# Patient Record
Sex: Female | Born: 1945 | Race: White | Hispanic: No | Smoking: Never smoker
Health system: Southern US, Community
[De-identification: ages and names within clinical notes are randomized; demographics above are authoritative.]

## PROBLEM LIST (undated history)

## (undated) DIAGNOSIS — R0602 Shortness of breath: Secondary | ICD-10-CM

## (undated) DIAGNOSIS — K219 Gastro-esophageal reflux disease without esophagitis: Secondary | ICD-10-CM

## (undated) DIAGNOSIS — R32 Unspecified urinary incontinence: Secondary | ICD-10-CM

## (undated) DIAGNOSIS — K529 Noninfective gastroenteritis and colitis, unspecified: Secondary | ICD-10-CM

## (undated) DIAGNOSIS — F32A Depression, unspecified: Secondary | ICD-10-CM

## (undated) DIAGNOSIS — T4145XA Adverse effect of unspecified anesthetic, initial encounter: Secondary | ICD-10-CM

## (undated) DIAGNOSIS — T8859XA Other complications of anesthesia, initial encounter: Secondary | ICD-10-CM

## (undated) DIAGNOSIS — J189 Pneumonia, unspecified organism: Secondary | ICD-10-CM

## (undated) DIAGNOSIS — R159 Full incontinence of feces: Secondary | ICD-10-CM

## (undated) DIAGNOSIS — F419 Anxiety disorder, unspecified: Secondary | ICD-10-CM

## (undated) DIAGNOSIS — R42 Dizziness and giddiness: Secondary | ICD-10-CM

## (undated) DIAGNOSIS — N3020 Other chronic cystitis without hematuria: Secondary | ICD-10-CM

## (undated) DIAGNOSIS — I10 Essential (primary) hypertension: Secondary | ICD-10-CM

## (undated) DIAGNOSIS — Z972 Presence of dental prosthetic device (complete) (partial): Secondary | ICD-10-CM

## (undated) DIAGNOSIS — R569 Unspecified convulsions: Secondary | ICD-10-CM

## (undated) DIAGNOSIS — K08109 Complete loss of teeth, unspecified cause, unspecified class: Secondary | ICD-10-CM

## (undated) DIAGNOSIS — E039 Hypothyroidism, unspecified: Secondary | ICD-10-CM

## (undated) DIAGNOSIS — R251 Tremor, unspecified: Secondary | ICD-10-CM

## (undated) DIAGNOSIS — C50919 Malignant neoplasm of unspecified site of unspecified female breast: Secondary | ICD-10-CM

## (undated) DIAGNOSIS — Z9221 Personal history of antineoplastic chemotherapy: Secondary | ICD-10-CM

## (undated) DIAGNOSIS — Z86010 Personal history of colonic polyps: Secondary | ICD-10-CM

## (undated) DIAGNOSIS — G4730 Sleep apnea, unspecified: Secondary | ICD-10-CM

## (undated) DIAGNOSIS — I639 Cerebral infarction, unspecified: Secondary | ICD-10-CM

## (undated) DIAGNOSIS — D649 Anemia, unspecified: Secondary | ICD-10-CM

## (undated) DIAGNOSIS — F329 Major depressive disorder, single episode, unspecified: Secondary | ICD-10-CM

## (undated) DIAGNOSIS — Z923 Personal history of irradiation: Secondary | ICD-10-CM

## (undated) DIAGNOSIS — H9190 Unspecified hearing loss, unspecified ear: Secondary | ICD-10-CM

## (undated) DIAGNOSIS — M1990 Unspecified osteoarthritis, unspecified site: Secondary | ICD-10-CM

## (undated) DIAGNOSIS — F445 Conversion disorder with seizures or convulsions: Secondary | ICD-10-CM

## (undated) HISTORY — DX: Full incontinence of feces: R15.9

## (undated) HISTORY — DX: Conversion disorder with seizures or convulsions: F44.5

## (undated) HISTORY — DX: Tremor, unspecified: R25.1

## (undated) HISTORY — DX: Personal history of irradiation: Z92.3

## (undated) HISTORY — PX: COLONOSCOPY: SHX174

## (undated) HISTORY — DX: Dizziness and giddiness: R42

## (undated) HISTORY — PX: OTHER SURGICAL HISTORY: SHX169

## (undated) HISTORY — DX: Unspecified urinary incontinence: R32

## (undated) HISTORY — DX: Unspecified convulsions: R56.9

## (undated) HISTORY — DX: Anemia, unspecified: D64.9

## (undated) HISTORY — DX: Personal history of colonic polyps: Z86.010

## (undated) HISTORY — PX: MULTIPLE TOOTH EXTRACTIONS: SHX2053

## (undated) HISTORY — DX: Depression, unspecified: F32.A

## (undated) HISTORY — DX: Major depressive disorder, single episode, unspecified: F32.9

## (undated) HISTORY — DX: Essential (primary) hypertension: I10

## (undated) HISTORY — DX: Other chronic cystitis without hematuria: N30.20

## (undated) HISTORY — PX: ABDOMINAL HYSTERECTOMY: SHX81

---

## 1978-08-26 DIAGNOSIS — I639 Cerebral infarction, unspecified: Secondary | ICD-10-CM

## 1978-08-26 HISTORY — DX: Cerebral infarction, unspecified: I63.9

## 1998-05-11 ENCOUNTER — Encounter: Payer: Self-pay | Admitting: Emergency Medicine

## 1998-05-11 ENCOUNTER — Inpatient Hospital Stay (HOSPITAL_COMMUNITY): Admission: EM | Admit: 1998-05-11 | Discharge: 1998-05-12 | Payer: Self-pay | Admitting: Emergency Medicine

## 1999-01-12 ENCOUNTER — Other Ambulatory Visit: Admission: RE | Admit: 1999-01-12 | Discharge: 1999-01-12 | Payer: Self-pay | Admitting: Family Medicine

## 1999-02-22 ENCOUNTER — Emergency Department (HOSPITAL_COMMUNITY): Admission: EM | Admit: 1999-02-22 | Discharge: 1999-02-22 | Payer: Self-pay | Admitting: Emergency Medicine

## 1999-02-22 ENCOUNTER — Encounter: Payer: Self-pay | Admitting: Emergency Medicine

## 2000-02-13 ENCOUNTER — Emergency Department (HOSPITAL_COMMUNITY): Admission: EM | Admit: 2000-02-13 | Discharge: 2000-02-13 | Payer: Self-pay | Admitting: Emergency Medicine

## 2000-07-08 ENCOUNTER — Encounter: Admission: RE | Admit: 2000-07-08 | Discharge: 2000-08-13 | Payer: Self-pay | Admitting: Family Medicine

## 2000-07-23 ENCOUNTER — Encounter: Payer: Self-pay | Admitting: Family Medicine

## 2000-07-23 ENCOUNTER — Encounter: Admission: RE | Admit: 2000-07-23 | Discharge: 2000-07-23 | Payer: Self-pay | Admitting: Family Medicine

## 2000-08-01 ENCOUNTER — Ambulatory Visit (HOSPITAL_COMMUNITY): Admission: RE | Admit: 2000-08-01 | Discharge: 2000-08-01 | Payer: Self-pay | Admitting: Family Medicine

## 2000-08-01 ENCOUNTER — Encounter: Payer: Self-pay | Admitting: Family Medicine

## 2000-09-11 ENCOUNTER — Encounter: Admission: RE | Admit: 2000-09-11 | Discharge: 2000-09-11 | Payer: Self-pay | Admitting: Family Medicine

## 2000-09-11 ENCOUNTER — Encounter: Payer: Self-pay | Admitting: Family Medicine

## 2000-09-15 ENCOUNTER — Encounter: Payer: Self-pay | Admitting: Neurosurgery

## 2000-09-15 ENCOUNTER — Ambulatory Visit (HOSPITAL_COMMUNITY): Admission: RE | Admit: 2000-09-15 | Discharge: 2000-09-15 | Payer: Self-pay | Admitting: Neurosurgery

## 2001-05-07 ENCOUNTER — Inpatient Hospital Stay (HOSPITAL_COMMUNITY): Admission: EM | Admit: 2001-05-07 | Discharge: 2001-05-08 | Payer: Self-pay | Admitting: Emergency Medicine

## 2001-05-07 ENCOUNTER — Encounter: Payer: Self-pay | Admitting: Emergency Medicine

## 2001-05-08 ENCOUNTER — Encounter: Payer: Self-pay | Admitting: Internal Medicine

## 2001-07-26 ENCOUNTER — Emergency Department (HOSPITAL_COMMUNITY): Admission: EM | Admit: 2001-07-26 | Discharge: 2001-07-27 | Payer: Self-pay | Admitting: Emergency Medicine

## 2002-05-18 ENCOUNTER — Inpatient Hospital Stay (HOSPITAL_COMMUNITY): Admission: EM | Admit: 2002-05-18 | Discharge: 2002-05-22 | Payer: Self-pay | Admitting: Psychiatry

## 2002-05-31 ENCOUNTER — Encounter: Payer: Self-pay | Admitting: Emergency Medicine

## 2002-05-31 ENCOUNTER — Emergency Department (HOSPITAL_COMMUNITY): Admission: EM | Admit: 2002-05-31 | Discharge: 2002-06-01 | Payer: Self-pay | Admitting: Emergency Medicine

## 2002-06-01 ENCOUNTER — Encounter: Payer: Self-pay | Admitting: Emergency Medicine

## 2002-12-15 ENCOUNTER — Ambulatory Visit (HOSPITAL_COMMUNITY): Admission: RE | Admit: 2002-12-15 | Discharge: 2002-12-15 | Payer: Self-pay | Admitting: Neurosurgery

## 2002-12-15 ENCOUNTER — Encounter: Payer: Self-pay | Admitting: Neurosurgery

## 2003-03-08 ENCOUNTER — Encounter: Admission: RE | Admit: 2003-03-08 | Discharge: 2003-03-08 | Payer: Self-pay | Admitting: Orthopedic Surgery

## 2003-03-08 ENCOUNTER — Encounter: Payer: Self-pay | Admitting: Orthopedic Surgery

## 2003-05-17 ENCOUNTER — Encounter: Payer: Self-pay | Admitting: Radiology

## 2003-05-17 ENCOUNTER — Encounter: Admission: RE | Admit: 2003-05-17 | Discharge: 2003-05-17 | Payer: Self-pay | Admitting: Neurosurgery

## 2003-05-17 ENCOUNTER — Encounter: Payer: Self-pay | Admitting: Neurosurgery

## 2003-06-20 ENCOUNTER — Encounter: Admission: RE | Admit: 2003-06-20 | Discharge: 2003-06-20 | Payer: Self-pay | Admitting: Neurosurgery

## 2003-06-20 ENCOUNTER — Encounter: Payer: Self-pay | Admitting: Neurosurgery

## 2003-08-27 HISTORY — PX: BACK SURGERY: SHX140

## 2003-08-27 HISTORY — PX: TOTAL HIP ARTHROPLASTY: SHX124

## 2004-03-14 ENCOUNTER — Inpatient Hospital Stay (HOSPITAL_COMMUNITY): Admission: RE | Admit: 2004-03-14 | Discharge: 2004-03-21 | Payer: Self-pay | Admitting: Neurosurgery

## 2004-03-21 ENCOUNTER — Inpatient Hospital Stay
Admission: RE | Admit: 2004-03-21 | Discharge: 2004-04-04 | Payer: Self-pay | Admitting: Physical Medicine & Rehabilitation

## 2004-04-08 ENCOUNTER — Emergency Department (HOSPITAL_COMMUNITY): Admission: EM | Admit: 2004-04-08 | Discharge: 2004-04-08 | Payer: Self-pay | Admitting: Emergency Medicine

## 2004-04-19 ENCOUNTER — Encounter: Admission: RE | Admit: 2004-04-19 | Discharge: 2004-04-19 | Payer: Self-pay | Admitting: Neurosurgery

## 2004-04-24 ENCOUNTER — Encounter: Admission: RE | Admit: 2004-04-24 | Discharge: 2004-06-25 | Payer: Self-pay | Admitting: Neurosurgery

## 2004-06-26 ENCOUNTER — Ambulatory Visit: Payer: Self-pay | Admitting: Physical Medicine & Rehabilitation

## 2004-06-26 ENCOUNTER — Inpatient Hospital Stay (HOSPITAL_COMMUNITY): Admission: RE | Admit: 2004-06-26 | Discharge: 2004-06-29 | Payer: Self-pay | Admitting: Orthopedic Surgery

## 2004-06-29 ENCOUNTER — Inpatient Hospital Stay
Admission: RE | Admit: 2004-06-29 | Discharge: 2004-07-06 | Payer: Self-pay | Admitting: Physical Medicine & Rehabilitation

## 2004-06-29 ENCOUNTER — Ambulatory Visit: Payer: Self-pay | Admitting: Physical Medicine & Rehabilitation

## 2004-09-28 ENCOUNTER — Ambulatory Visit: Payer: Self-pay | Admitting: Cardiovascular Disease

## 2004-10-18 ENCOUNTER — Ambulatory Visit: Payer: Self-pay | Admitting: Cardiology

## 2004-12-05 ENCOUNTER — Ambulatory Visit: Payer: Self-pay | Admitting: Family Medicine

## 2005-03-15 ENCOUNTER — Ambulatory Visit (HOSPITAL_COMMUNITY): Admission: RE | Admit: 2005-03-15 | Discharge: 2005-03-15 | Payer: Self-pay | Admitting: Family Medicine

## 2005-03-15 ENCOUNTER — Emergency Department (HOSPITAL_COMMUNITY): Admission: EM | Admit: 2005-03-15 | Discharge: 2005-03-15 | Payer: Self-pay | Admitting: Emergency Medicine

## 2005-03-23 ENCOUNTER — Emergency Department (HOSPITAL_COMMUNITY): Admission: EM | Admit: 2005-03-23 | Discharge: 2005-03-23 | Payer: Self-pay | Admitting: Emergency Medicine

## 2005-03-26 ENCOUNTER — Ambulatory Visit: Payer: Self-pay | Admitting: Family Medicine

## 2005-04-04 ENCOUNTER — Encounter: Admission: RE | Admit: 2005-04-04 | Discharge: 2005-04-04 | Payer: Self-pay | Admitting: Internal Medicine

## 2005-06-14 ENCOUNTER — Ambulatory Visit: Payer: Self-pay | Admitting: Family Medicine

## 2005-08-30 ENCOUNTER — Ambulatory Visit: Payer: Self-pay | Admitting: Family Medicine

## 2005-09-27 ENCOUNTER — Ambulatory Visit: Payer: Self-pay | Admitting: Cardiovascular Disease

## 2005-10-03 ENCOUNTER — Encounter: Payer: Self-pay | Admitting: Cardiology

## 2005-10-03 ENCOUNTER — Ambulatory Visit: Payer: Self-pay

## 2005-10-25 ENCOUNTER — Emergency Department (HOSPITAL_COMMUNITY): Admission: EM | Admit: 2005-10-25 | Discharge: 2005-10-25 | Payer: Self-pay | Admitting: Emergency Medicine

## 2005-10-25 ENCOUNTER — Inpatient Hospital Stay (HOSPITAL_COMMUNITY): Admission: EM | Admit: 2005-10-25 | Discharge: 2005-10-27 | Payer: Self-pay | Admitting: Emergency Medicine

## 2005-10-28 ENCOUNTER — Inpatient Hospital Stay (HOSPITAL_COMMUNITY): Admission: EM | Admit: 2005-10-28 | Discharge: 2005-11-01 | Payer: Self-pay | Admitting: Emergency Medicine

## 2005-10-28 ENCOUNTER — Encounter: Payer: Self-pay | Admitting: Cardiology

## 2005-10-28 ENCOUNTER — Ambulatory Visit: Payer: Self-pay | Admitting: Cardiology

## 2005-11-12 ENCOUNTER — Encounter: Payer: Self-pay | Admitting: Emergency Medicine

## 2005-11-13 ENCOUNTER — Inpatient Hospital Stay (HOSPITAL_COMMUNITY): Admission: EM | Admit: 2005-11-13 | Discharge: 2005-11-15 | Payer: Self-pay | Admitting: Internal Medicine

## 2005-11-21 ENCOUNTER — Ambulatory Visit: Payer: Self-pay | Admitting: Family Medicine

## 2008-01-09 ENCOUNTER — Telehealth: Payer: Self-pay | Admitting: Internal Medicine

## 2009-03-31 ENCOUNTER — Emergency Department (HOSPITAL_COMMUNITY): Admission: EM | Admit: 2009-03-31 | Discharge: 2009-03-31 | Payer: Self-pay | Admitting: Emergency Medicine

## 2009-04-15 ENCOUNTER — Telehealth: Payer: Self-pay | Admitting: Family Medicine

## 2009-06-18 ENCOUNTER — Telehealth: Payer: Self-pay | Admitting: Internal Medicine

## 2009-06-23 ENCOUNTER — Encounter: Payer: Self-pay | Admitting: Family Medicine

## 2009-06-26 ENCOUNTER — Telehealth (INDEPENDENT_AMBULATORY_CARE_PROVIDER_SITE_OTHER): Payer: Self-pay | Admitting: *Deleted

## 2009-07-03 ENCOUNTER — Telehealth (INDEPENDENT_AMBULATORY_CARE_PROVIDER_SITE_OTHER): Payer: Self-pay | Admitting: *Deleted

## 2010-09-15 ENCOUNTER — Encounter: Payer: Self-pay | Admitting: Neurosurgery

## 2010-09-16 ENCOUNTER — Encounter: Payer: Self-pay | Admitting: Cardiovascular Disease

## 2011-01-11 NOTE — Discharge Summary (Signed)
[place]. [facility]  Patient:    [name], [name] Visit Number: [id number] MRN: [id number]          Service Type: MED Location: [phone] Attending Physician:  Dorena Cookey Dictated by:   Cornell Barman, P.A. Admit Date:  05/07/2001 Discharge Date: 05/08/2001   CC:         [name], M.D.   Discharge Summary  DISCHARGE DIAGNOSES: 1. Near syncope. 2. Dyspnea. 3. Hypertension. 4. Epigastric pain.  HISTORY OF PRESENT ILLNESS:  [name] is a 65 year old white female who presents with an episode of dyspnea and near syncope earlier today.  She has difficulty describing this episode.  She states it occurred around 6 a.m.  The patient awoke at 3 a.m., unable to sleep.  She cannot elicit any reason for her inability to sleep.  She denies shortness of breath or chest pain. However, at 6 a.m. while playing cards with her daughter, she had a strange sensation overcome her where she felt like she was going to pass out.  She did have a "strange sensation" in her midepigastric area.  She also had some nausea and a hot flush.  She did feel that her vision was blurred and she was near losing consciousness.  She did not lose consciousness, and the episode lasted about two minutes.  After this episode she took a Xanax and began feeling sleepy.  PAST MEDICAL HISTORY: 1. Hypertension. 2. Anxiety and depression. 3. Osteoarthritis. 4. Degenerative disk disease. 5. The patient has evidence of lumbar and sacral spinal stenosis on MRI. 6. History of total vaginal hysterectomy. 7. 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  HOSPITAL COURSE: #1 - NEAR SYNCOPE:  The etiology is not clear.  This may have been a vasovagal reaction secondary to sleep deprivation.  The patient had no further episodes.  #2 - EPIGASTRIC PAIN:  We admitted the patient to telemetry and ruled out myocardial infarction.  Cardiac enzymes were negative.  We did also empirically start the  patient on some Protonix for reflux disease.  EKG was without ischemia.  Cardiac enzymes were negative.  #3 - GASTROESOPHAGEAL REFLUX DISEASE:  Again, we empirically started the patient on some Protonix.  She has been taking ibuprofen for several weeks for some orthopedic complaints, and we were concerned that she may have some gastritis.  There is no evidence of GI bleed at this time.  The patients ibuprofen was discontinued, and we will have her use Vioxx at discharge. Because of her epigastric pain we did get a right upper quadrant ultrasound that was negative for gallstones.  #4 - CARBUNCLE:  She has a carbuncle in her right armpit.  She was started on Keflex for this, and she will complete a 7-day course.  #5 - DYSLIPIDEMIA:  We did do a fasting lipid profile on the patient while she was her, and her triglycerides were elevated at 283.  Otherwise, cholesterol was normal at 164, HDL was 37, and LDL was 78.  #6 - HYPOKALEMIA:  We did replace her potassium.  DISCHARGE LABORATORY DATA:  A 2-D echo showed no left ventricular function, no wall motion abnormalities.  Hemoglobin 12.6, hematocrit 35.2.  TSH was 3.114.  Potassium 3.2, glucose 122. LFTs were normal.  DISCHARGE MEDICATIONS: 1. Premarin 1.25 mg q.d. 2. Celexa 40 mg q.d. 3. Diovan HCT 160/12.5 mg b.i.d. 4. Toprol XL 50 mg q.d. 5. Xanax as at home. 6. Protonix 40 mg q.d. 7. Vioxx 25  mg q.d. 8. Keflex 500 mg 1 tablet b.i.d. for seven days.  FOLLOW-UP:  With Dr. Clent Hoffman in one to two weeks. Dictated by:   Cornell Barman, P.A. Attending Physician:  Dorena Cookey DD:  05/08/01 TD:  05/08/01 Job: [id number]

## 2011-01-11 NOTE — Discharge Summary (Signed)
[name], [name]                 ACCOUNT NO.:  [id number]   MEDICAL RECORD NO.:  [id number]          PATIENT TYPE:  INP   LOCATION:  1409                         FACILITY:  [facility]   PHYSICIAN:  Corinna L. Lendell Caprice, MDDATE OF BIRTH:  [birth year]   DATE OF ADMISSION:  10/28/2005  DATE OF DISCHARGE:  11/01/2005                                 DISCHARGE SUMMARY   DISCHARGE DIAGNOSES:  1.  Congestive heart failure secondary to uncontrolled hypertension.  2.  Uncontrolled hypertension.  3.  Recent pneumonia.  4.  Gastroesophageal reflux disease.  5.  Chronic cough.  6.  Chronic pain.  7.  Anxiety/depression.  8.  History of tremor.  9.  Hypothyroidism.  10. 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  11. Abnormal cardiac enzymes secondary to uncontrolled hypertension and      congestive heart failure.  12. Bladder incontinence, being worked up by Dr. Etta Grandchild.  13. Abdominal aortic aneurysm, reportedly measuring 3.5 to 3.6 cm, according      to Dr. Fabio Bering note.   DISCHARGE MEDICATIONS:  1.  Her clonidine has been increased to 0.2 mg p.o. b.i.d.  2.  She is to continue atenolol 100 mg p.o. daily.  3.  She is started on hydralazine 10 mg p.o. t.i.d.  4.  Lasix 40 mg p.o. daily.  5.  She is to stop Avelox.  6.  She may use over-the-counter antifungal cream to her groin, as she had      some intertrigo there as well, which I failed to mention.  7.  She may continue her other outpatient medications, which include      Neurontin, Darvocet, Synthroid, Premarin, Nexium, Lexapro.   CONDITION:  Stable.   FOLLOW UP:  1.  Follow up with [facility] in 2-4 weeks.  2.  Follow up with Dr. Eden Emms as previously scheduled.  3.  Follow up with Dr. Etta Grandchild as previously scheduled.   CONDITION:  Stable.   CONSULTATIONS:  None.   PROCEDURES:  None.   DIET:  Low salt.   ACTIVITY:  Ad lib.   PERTINENT  LABORATORIES:  ABG on room air revealed a pH of 7.364, pCO2 44,  pO2 65.  CBC:  Significant for a hemoglobin of 10.8, hematocrit 33.5,  otherwise unremarkable.  PTT 44.  D-dimer 0.93.  INR 1.  Complete metabolic  panel significant for an albumin of 2.8.  Peak CPK was 496.  Peak MB was 6.  Normal index.  Peak troponin was 0.06.  B-type natriuretic peptide was 517.  TSH was 3.597.   SPECIAL STUDIES/RADIOLOGY:  1.  Chest x-ray, two views, on admission showed interval development of      diffuse interstitial opacities suggesting pulmonary edema.  2.  CT angiogram of the chest showed no pulmonary emboli.  Cardiomegaly,      vascular congestion, and edema, left lower lobe pneumonia.  Distal      thoracic aortic focal dissection versus possibly penetrating      atherosclerotic ulcer, age indeterminate.  3.  Repeat chest x-ray showed interval improvement.  4.  Echocardiogram showed ejection fraction of 50-55%.  Could not evaluate      left ventricular wall motion, moderately increased left ventricular wall      thickness.  Left atrium mildly dilated.  5.  EKG showed normal sinus rhythm with minimal voltage criteria for LVH,      nonspecific changes.   HISTORY/HOSPITAL COURSE:  [name] is an unassigned 65 year old white  female who had just been discharged from the hospital, at which time she had  been treatment for pneumonia.  She presented with shortness of breath,  paroxysmal nocturnal edema.  Her cough was unchanged, and she also had leg  swelling.  Please see H&P for complete details.  Her blood pressure was  196/100.  Her oxygen saturations were normal.  Respiratory rate 24.  She had  rales on exam.  No wheezes or rhonchi.  Clinical presentation, chest x-ray,  and exam were consistent with CHF rather than worsening pneumonia.  Her  echocardiogram showed fairly well preserved ejection fraction, and this must  be due to diastolic dysfunction and uncontrolled hypertension.  She reports   that she only takes her clonidine every once in  while because it gives her  a dry mouth.  Also, she had recently been taken off her ACE inhibitor and  angiotensin receptor blocker by Dr. Eden Emms due to a chronic cough.  She had  been compliant with her atenolol.  Her blood pressure proved very difficult  to control, and she required transfer to the step-down unit from telemetry  for a labetolol drip.  Also, the CAT scan was done due to the elevated D-  dimer.  It showed a possible small descending thoracic aortic dissection.  I  discussed the case with Dr. Cornelius Moras of [facility], who agreed that medical management  and strict blood pressure control were key here.  I tried to get old  records.  The patient reported that she had an aneurysm followed by Dr.  Eden Emms.  Apparently, this is for abdominal aortic aneurysm.  The patient  denies any tearing back pain.  I suspect this may be an incidental finding  and may in fact be chronic.  The patient was started on diuretics as well.  Oxygen.  Her symptoms improved.  She was very anxious and concerned about  weakness, chronic cough, dyspnea on exertion; however, she has normal oxygen  saturations, and I personally have seen her walking up and down the halls  with the nurses without any difficulty.  I suspect some of this may be  anxiety-related.  She did report that there is a lot of stress at home.  She  was concerned about the Lasix, due to her urinary incontinence.  I suspect  she may not be compliant with this, but she apparently has a follow-up  appointment with Dr. Etta Grandchild, who is going to perform urodynamic testing.  She  also had some intertrigo, which can be treated with over-the-counter  antifungal.  At the time of discharge, her blood pressure was well  controlled, about 120/60, and I suspect she is about at her baseline.  I did tell her that she may continue to be weak for several weeks, as she is still  recovering  from the pneumonia as well.  She  continued her Avelox while here, and at  this point has completed a 7-day course, and I will stop this.  She remained  in normal sinus rhythm.  Her  cardiac enzymes were minimally elevated, which  I do not feel is an MI, but rather accelerated hypertension and congestive  heart failure.      Corinna L. Lendell Caprice, MD  Electronically Signed     CLS/MEDQ  D:  11/01/2005  T:  11/01/2005  Job:  [id number]   cc:   Charlton Haws, M.D.  [address]  [address]  [place]  [place] [address]   Fanny Dance. Rankins, M.D.  Fax: [phone]

## 2011-01-11 NOTE — Discharge Summary (Signed)
[name], [name] NO.:  [id number]   MEDICAL RECORD NO.:  [id number]          PATIENT TYPE:  ORB   LOCATION:  4502                         FACILITY:  [facility]   PHYSICIAN:  Ranelle Oyster, M.D.DATE OF BIRTH:  01-27-1946   DATE OF ADMISSION:  06/29/2004  DATE OF DISCHARGE:  07/06/2004                                 DISCHARGE SUMMARY   DISCHARGE DIAGNOSES:  1.  Right total knee replacement secondary to osteoarthritis June 26, 2004.  2.  Pain management.  3.  Coumadin for deep vein thrombosis prophylaxis.  4.  Anemia.  5.  Gastroesophageal reflux disease.  6.  Hypertension.  7.  Depression with anxiety.  8.  Urinary incontinence.   HISTORY OF PRESENT ILLNESS:  A 65 year old white female admitted November 1  with end-stage osteoarthritis of the right hip.  No change with conservative  care.  Underwent a right total hip replacement November 1 per Dr. August Saucer.  Placed on Coumadin for deep vein thrombosis prophylaxis.  Weightbearing as  tolerated.  Postoperative anemia 8.4 and monitored. Foley catheter tube  removed November 3.  Admitted to subacute care services.   PAST MEDICAL HISTORY:  See discharge diagnoses.  No alcohol or tobacco.   SOCIAL HISTORY:  Lives with 55 year old grandson in [place].  One-level  apartment.  No steps to entry.  Local family works.   ALLERGIES:  1.  CODEINE.  2.  MEPERIDINE.   MEDICATIONS PRIOR TO ADMISSION:  1.  Premarin.  2.  Hyzaar.  3.  Ditropan.  4.  Celexa.  5.  Labetalol.  6.  Nexium.  7.  Klonopin.  8.  Neurontin.   HOSPITAL COURSE:  The patient with progressive gains while on rehab services  with therapies initiated daily.  The following issues are followed during  patient's rehab course.  Pertaining to [name]'s right total hip  replacement, surgical site healing nicely.  No signs of infection.  She was  ambulating supervision with a walker, weightbearing as tolerated with hip  precautions.   Pain management ongoing the use of Tylox and Neurontin.  MS  Contin was initiated for short time, but discontinued due to some increase  sedation.  She remained on Coumadin for deep vein thrombosis prophylaxis.  Latest INR of 3.0.  She will complete Coumadin protocol.  Postoperative  anemia with latest hemoglobin of 8.5, hematocrit 25.2.  There was no obvious  signs of bleeding.  Blood pressure controlled with Hyzaar and labetolol with  systolic pressures 112-135.  She had no bowel or bladder disturbances.  She  did continue on her Ditropan for some urinary incontinence.  She had a long  documented history of depression with anxiety.  She continued on her  Klonopin as well as Celexa.  Overall, for her functional mobility she was  ambulating supervision level with hip precautions, minimal assist for lower  body activities of daily living.  Home health therapies have been arranged.   Latest labs showed an INR of 3.0, hemoglobin 8.5, hematocrit 25.2, sodium  137, potassium 3.7, BUN 11, creatinine  1.2.   DISCHARGE MEDICATIONS:  1.  Coumadin 3 mg daily to be completed on July 26, 2004.  2.  Neurontin 100 mg at bedtime.  3.  Ditropan 5 mg twice daily.  4.  Labetolol 200 mg twice daily.  5.  Klonopin 1 mg at bedtime.  6.  Protonix 40 mg daily.  7.  Hyzaar daily.  8.  Celexa 20 mg daily.  9.  Premarin 0.625 mg daily.  10. Tylox as needed pain.  11. Xanax 0.5 mg daily as needed.   Activity was tolerate with hip precautions.   DIET:  Regular.   WOUND CARE:  Cleanse incision daily warm soap and water.   SPECIAL INSTRUCTIONS:  Home health nurse per rehab services.  Home health  agency to complete Coumadin protocol.  The patient should followup with Dr.  August Saucer of orthopedic services.  Call for appointment.       DA/MEDQ  D:  07/05/2004  T:  07/05/2004  Job:  [id number]   cc:   G. Dorene Grebe, M.D.  Fax: [phone]   [facility]

## 2011-01-11 NOTE — H&P (Signed)
[name], [name]                 ACCOUNT NO.:  [id number]   MEDICAL RECORD NO.:  [id number]          PATIENT TYPE:  INP   LOCATION:  1515                         FACILITY:  [facility]   PHYSICIAN:  Kela Millin, M.D.DATE OF BIRTH:  1946-06-28   DATE OF ADMISSION:  10/25/2005  DATE OF DISCHARGE:                                HISTORY & PHYSICAL   PRIMARY CARE PHYSICIAN:  Turkey R. Rankins, M.D./[facility]   CHIEF COMPLAINT:  Worsening cough.   HISTORY OF PRESENT ILLNESS:  The patient a 65 year old, white female with  past medical history significant for GERD, hypertension, hypothyroidism and  essential tremors who presents with above complaints x2-3 weeks.  She states  that the cough has been nonproductive and associated with pleuritic chest  pain.  She has also had subjective fevers for the past couple of days.  The  patient was seen in the emergency room earlier on the day of admission and  at that time, she was started on oral antibiotics and discharged home, but  she returned stating that she was unable to fill her prescription and that  her cough was continuous with pain when she coughs.  She denies hemoptysis,  hematemesis, dysuria and no shortness of breath.  She admits to nausea, but  no vomiting.   The patient was seen in the ER and chest x-ray was consistent with a left  lower lobe pneumonia.  She is admitted to the [facility] service for  further evaluation and management.   PAST MEDICAL HISTORY:  1.  History of urinary incontinence.  2.  History of anxiety and depression.  3.  History of anemia.   MEDICATIONS:  1.  Gabapentin 800 mg nightly.  2.  Darvocet p.r.n.  3.  Clonidine 0.1 mg b.i.d.  4.  Levothyroxine 0.05 mg daily.  5.  Premarin 0.625 mg daily.  6.  Nexium 40 mg daily.  7.  Atenolol 10 mg daily.  8.  Lexapro 40 mg daily.   ALLERGIES:  CODEINE.   SOCIAL HISTORY:  She denies tobacco and alcohol.   FAMILY HISTORY:  Negative for diabetes.   Also negative for hypertension and  no MIs.   REVIEW OF SYSTEMS:  As per HPI.  Other review of systems negative.   PHYSICAL EXAMINATION:  GENERAL:  The patient is an older, white female,  alert and oriented, acutely ill-appearing in no respiratory distress.  VITAL SIGNS:  Temperature 97.2, initially 101.7, blood pressure 154/89,  pulse 60, respirations 20.  O2 saturations 96%.  HEENT:  PERRL.  EOMI.  Slightly dry mucous membranes.  No oral exudates.  NECK:  Supple.  No adenopathy or thyromegaly.  LUNGS:  Left lower lobe bronchial breath sounds, crackles present.  No  wheezes.  CARDIAC:  Regular rate and rhythm, normal S1, S2.  No S3 appreciated.  ABDOMEN:  Soft, bowel sounds present.  Nontender, nondistended.  No  organomegaly.  No masses palpable.  EXTREMITIES:  No clubbing, cyanosis or edema.  NEUROLOGIC:  Alert and oriented x3.  Cranial nerves 2-12 grossly intact.  Nonfocal exam.   LABORATORY DATA  AND X-RAY FINDINGS:  Chest x-ray with left lower lobe  infiltrates.  Her pH is 7.36, pCO2 44.8, pO2 65, O2 saturations 87.8%.  CBC  and chemistries are pending.   ASSESSMENT/PLAN:  1.  Left lower lobe pneumonia.  Will start empiric antibiotics and follow      up.  2.  Hypertension.  Continue outpatient medications.  3.  Gastroesophageal reflux disease.  Continue proton pump inhibitor.  4.  Hypothyroidism.  Continue Levothyroxine.  5.  History of essential tremors, continue beta-blocker.      Kela Millin, M.D.  Electronically Signed     ACV/MEDQ  D:  10/26/2005  T:  10/26/2005  Job:  [id number]   cc:   Fanny Dance. Rankins, M.D.  Fax: [phone]

## 2011-01-11 NOTE — Op Note (Signed)
NAMEKAISHA, WACHOB                           ACCOUNT NO.:  [id number]   MEDICAL RECORD NO.:  [id number]                   PATIENT TYPE:  INP   LOCATION:  3172                                 FACILITY:  [facility]   PHYSICIAN:  Donalee Citrin, M.D.                     DATE OF BIRTH:  [birth year]   DATE OF PROCEDURE:  03/14/2004  DATE OF DISCHARGE:                                 OPERATIVE REPORT   PREOPERATIVE DIAGNOSIS:  Severe lumbar spinal stenosis, L3-4, L4-5 with  severe lumbar radiculopathy and virtual complete CSF block by MRI criteria  with severe mechanical back pain.   OPERATION/PROCEDURE:  1. Radical decompressive lumbar laminectomy, L3-4, L4-5.  2. Posterior lumbar interbody fusion, L3-4, L4-5, with 10 x 26 mm tangent     allograft L4-5 and 12 x 26 mm tangent allograft at L3-4.  3. Pedicle instrumentation using CD Horizon M10 pedicle screw fixation L3 to     L5  4. Posterolateral arthrodesis L3 to L5.  5. Repair of cerebrospinal fluid leak from possible myelographic __________     .   SURGEON:  Donalee Citrin, M.D.   ASSISTANT:  Tia Alert, M.D.   ANESTHESIA:  General.   HISTORY OF PRESENT ILLNESS:  The patient is a very pleasant 65 year old  female who has had over the last several years progressive worsening back,  bilateral hip and leg pain with severe neurogenic claudication.  Ambulates  only half a block or so before she has to stop and rest.  The patient began  having several months ago some questionable urinary incontinence.  It was  unclear whether this was due to her spine or not.  Imaging with MRI and  myelography showed severe spinal stenosis with partial CSF block.  The  patient was recommended surgery.  The patient was lost to followup for  several months and finally obtained clearance from the cardiac perspective  to undergo surgery.  Risks and benefits were discussed with the patient.   DESCRIPTION OF PROCEDURE:  She was brought to the OR and induced under  general anesthesia, positioned prone on the Wilson frame.  Back prepped and  draped in the usual sterile fashion. Preoperatively localized the L4-5 disk  space.  Midline incision made just superior and just inferior to this with  10-blade scalpel after 10 cc of lidocaine with epinephrine.  Bovie  electrocautery was used to dissect subperiosteally, was carried out in the  lamina of L3, 4, and 5 exposing the TPs at L3, 4, and 5 bilaterally.  Then  intraoperative x-ray confirmed the mobilization of the L4 pedicle and  spinous processes at L3 and L4 were removed.  There was noted to be a marked  amount of oozing from the paraspinal tissues.  Then  using __________ the  medical facet complex were drilled down and 3-4 mm Kerrison rongeurs were  used to  do a complete laminectomy at L3 and L4.  The dura was noted to be  markedly stenotic with severe facet arthropathy, compressing the dura in  hour-glass form at both L3-4 and L4-5.  This was teased away and freed up  with a #4 Penfield.  Then using 3 and 4 mm Kerrison punch the complete  medial facetectomies were performed decompressing lateral thecal sac.  During the decompression of the L3-4 level, there was CSF egressing from the  central small pinhole in the dura.  This could have been from the Kerrison  or could have been from the myelogram.  It was unclear at that time.  However, the amended laminectomy was completed around it.  It was sewn over  with 6-0 Prolene with complete resolution of the spinal fluid leak at that  point, then packed away.  Then the remainder of the facetectomies were  completed at L4-5, decompressing the L4-5 disk space and again severe  stenosis causes severe hour-glass compression of the thecal sac.  Both 3, 4  and 5 nerve roots were rapidly decompressing on the neural foramen.  There  was extensive epidural bleeding from the epidural veins.  These were  attempted to be coagulated but were only able to be controlled  with packing.  After the decompression had been completed, the sac was widely decompressed.  Attention then next taken to L4-5.  Attempts were made to coagulate the  epidural veins and the interspace was incised on the left side.  A 10  distractor was inserted.  This had good apposition on the end plates.  So  two 10 grafts were opened.  On the right side, the D'Errico nerve  root  retractor was used to reflect the right L5 nerve root medially.  An  annulotomy was made.  Disk spaces were adequately cleaned out.  Disks were  noted to be markedly degenerated.  Downgoing Epstein curets were used to  scrape the end plates as well as the sides.  Then cutter and chisel were  used to prepare to receive the bone graft. 10 x 26 mm Tangent allograft  was  inserted on the right side.  This procedure was repeated on the left side  with a cutter and chisel and then central disk was teased away with  downgoing Epstein curets, upbiting pituitaries.  Locally harvested allograft  was packed against the right side allograft and the left side allograft was  inserted in similar fashion.  Again fluoroscopy confirmed a good step and  trajectory along the way.  Then after both allografts were confirmed to be  in good position, attention was taken to the L3-4.  Again epidural veins  were coagulated.  Size 10 distractor was inserted. This did not have good  apposition of the end plates, the opposite side on the right side.  Annulotomy was made.  Disk space was cleaned out.   A 12 distractor was inserted.  A 12 distractor appeared to be the proper  size.  Two 12 grafts were opened on the left side.  A D'Errico nerve root  retractor was used to reflect  right L4 nerve root medially. Disk space was  adequately cleaned out, end plate scraped.  Bur and chisel again confirmed  by fluoroscopy as freed up and prepared the end plates.  A 12 x 26 mm Tangent allograft were adequately seated on the left side at L3-4.  Then   distractor was removed on the right.  Disk spaces were adequately  cleaned  out, noted to be markedly degenerated, except for large fragments removed  from central compartment.  Downgoing Epstein curet, size 12 cutter, and size  12 scissors were used to prepare the end plates and a 12 x 26 mm Tangent  allograft was inserted after locally harvesting allograft was packed against  the left side allograft and then the right side allograft was inserted.  After all four allografts were inserted, fluoroscopy confirmed good  position.  Approximately 1 mm deep to the posterior vertebral line, then  attention turned to the pedicle screw placement,  first on the left side.  Pilot holes were drilled with the on-spot Black Max drill, cannulated with  the awl, probed from within the pedicle as well as within the canal, tapped  with a 5.5 tap and 6.5 x 40 screw inserted at L3 on the left, again, from a  direct intercannula with indirect intracuticular inspection.  No medial and  lateral breech was achieved.  The left-sided L4 pedicle screw was inserted  during the screw placement.  The inferior aspect of the facet complex did  fracture.  However, this did not carry its way down through the pedicle into  the vertebral body, so the screw was left in place.  Then on the left side,  the left-sided L5 screw was inserted without event.  Again, fluoroscopy  confirming each __________  along the way.  Then the right side L3, 4, and 5  screws were all inserted in similar fashion.  All pedicles being competent  from within the pedicle and within the canal.  The wound was copiously  irrigated.  Meticulous hemostasis was maintained.  The lateral gutters and  TPs were aggressively decorticated.  The remainder of the locally harvested  allograft was packed in the lateral gutters along the TPs and then the 7 mm  rod was selected and inserted, tightened down the L5.  The L4 pedicle screw  on the right was compressed  against L5 on the left it was not due to the  fracture in the inferior aspect of the facet complex.  Then the L3 pedicle  screw was compressed against L4.  Then 422 crosslink was inserted.  Surgifoam was laid in the lateral gutters.  Aggressive hemostasis was  achieved.  Tisseel was overlaid and topically repaired a pinhole in the dura  and then the Hemovac drain was placed. Fluoroscopy confirmed good position  of all screws, rods and bone graft.  Counts were correct.  The wound was  closed with 0 Vicryl in the subcutaneous tissue and muscle, 2-0 interrupted  Vicryl in the subcutaneous tissue and running 4-0 subcuticular in the skin.  At the end of the case all needle counts and sponge counts were correct.                                               Donalee Citrin, M.D.    GC/MEDQ  D:  03/14/2004  T:  03/14/2004  Job:  [id number]

## 2011-01-11 NOTE — Discharge Summary (Signed)
[name], [name] NO.:  [id number]   MEDICAL RECORD NO.:  [id number]          PATIENT TYPE:  INP   LOCATION:  5035                         FACILITY:  [facility]   PHYSICIAN:  Burnard Bunting, M.D.    DATE OF BIRTH:  [birth year]   DATE OF ADMISSION:  06/26/2004  DATE OF DISCHARGE:  06/29/2004                                 DISCHARGE SUMMARY   DISCHARGE DIAGNOSES:  Right hip arthritis.   SECONDARY DIAGNOSES:  History of back surgery.   OPERATION/PROCEDURE:  Right hip replacement.   HOSPITAL COURSE:  [name] is a 65 year old patient with end-stage hip  arthritis.  She underwent right total hip replacement June 26, 2004.  She  tolerated procedure well without any complications.  Postoperative day #1  right foot dorsiflexion, plantar flexion was intact.  Hemoglobin was 9.2.  Patient was started on Coumadin for DVT prophylaxis.  The patient was  started on physical therapy for weightbearing as tolerated.  On  postoperative day #2 hemoglobin was 8.4, INR 1.9.  Incision was intact.  Patient mobilized well and was transferred to [facility] on postoperative day #3.  She will continue with Coumadin for DVT prophylaxis as well as weightbearing  as tolerated.   DISCHARGE MEDICATIONS:  1.  Admission medications.  2.  Coumadin.  3.  Percocet for pain.  4.  Robaxin.   I will see her back in the clinic in a week for staple removal.       GSD/MEDQ  D:  08/09/2004  T:  08/09/2004  Job:  [id number]

## 2011-01-11 NOTE — Op Note (Signed)
[name], [name]                 ACCOUNT NO.:  [id number]   MEDICAL RECORD NO.:  [id number]          PATIENT TYPE:  INP   LOCATION:  2550                         FACILITY:  [facility]   PHYSICIAN:  Burnard Bunting, M.D.    DATE OF BIRTH:  24-Jul-1946   DATE OF PROCEDURE:  06/26/2004  DATE OF DISCHARGE:                                 OPERATIVE REPORT   PREOPERATIVE DIAGNOSIS:  Right hip arthritis.   POSTOPERATIVE DIAGNOSIS:  Right hip arthritis.   OPERATION PERFORMED:  Right total hip replacement.   SURGEON:  Burnard Bunting, M.D.   ASSISTANT:  Jerolyn Shin. Tresa Res, M.D.   ANESTHESIA:  General endotracheal.   ESTIMATED BLOOD LOSS:  750 mL.   COMPLICATIONS:  Press-fit Acolade stem 2.5, +4 ceramic head, 50 ceramic no  hole cup.   DESCRIPTION OF PROCEDURE:  The patient was brought to the operating room  where general endotracheal anesthesia was induced.  Preoperative intravenous  antibiotics were introduced.  Patient was placed in lateral decubitus  position with the right hip up and the left axilla and peroneal nerve well  padded.  The right hip, leg and foot were prepped with DuraPrep solution and  draped in sterile manner.  The operative field was covered with an [place].  Posterior approach to the hip was utilized with a 15 cm incision.  Skin and  subcutaneous tissue were sharply divided.  Fascia lata was identified and  split in line parallel to its fibers.  The gluteus maximus muscle was split  bluntly.  Bleeding points encountered were controlled with electrocautery.  The sciatic nerve was palpated and protected at all times for the remaining  portion of the case.  Bursa was excised.  Piriformis tendon was identified,  tagged and cut.  External rotators were then detached.  Bleeding points  again were controlled using electrocautery.  The capsule was identified and  was tagged and divided in T-shaped fashion for later closure.  The neck cut  was then performed approximately slightly  more than a fingerbreadth above  the lesser trochanter.  At this time the soft tissue within the shoulder of  the piriformis fossa was removed.  Anterior acetabular retractor was placed.  The acetabulum itself was noted to be shallow.  The fovea was identified.  The inner table was identified.  Sequential reaming was then performed with  the focus being to deepen the acetabular socket to the inner wall.  This was  performed.  Trial cup was placed with a 0 degree liner.  At this time, the  femur was prepared up to a size 2 Acolade broach and reduction was performed  with a +0 and +4 head.  The +4 head gave excellent stability in full  extension, external rotation, position of sleep and 90 degrees abduction and  60 degrees of internal rotation.  Intraoperative x-ray confirmed good  position and alignment of the cup and the stem.  At this time a 2.5 broach  was placed into the femur which again gave an excellent press fit.  The  trial components were removed.  All joint surfaces were irrigated and then  the no hole acetabular shell was placed with a ceramic liner.  A 2.5 Acolade  stem was placed with excellent press fit obtained.  Reduction was again  tried with a trial +0 and +4 head and excellent stability was achieved with  the +4 head in full extension, position of sleep as well as 90 degrees of  hip flexion, 10 degrees of adduction and 60 degrees of internal rotation.  At this time the trial head was removed.  +4 ceramic head was placed.  Reduction was performed and the same parameters were maintained.  Sciatic  nerve was palpated and was found to be intact.  The incision was thoroughly  irrigated.  The capsule was closed using #1 Ethibond suture.  Fascia lata  was closed over a drain using #1 Vicryl suture.  The subcutaneous tissue was  closed in layers using 0 and 2-0 Vicryl suture.  Skin staples were applied.  Impervious dressing was then placed with Ioban.  Knee immobilizer was   placed.  Leg lengths were approximately equal.  At the conclusion of the  case dorsiflexion of the foot observed in the operating room.       GSD/MEDQ  D:  06/26/2004  T:  06/26/2004  Job:  [id number]

## 2011-01-11 NOTE — Discharge Summary (Signed)
[name], [name] NO.:  [id number]   MEDICAL RECORD NO.:  [id number]          PATIENT TYPE:  INP   LOCATION:  3039                         FACILITY:  [facility]   PHYSICIAN:  Donalee Citrin, M.D.        DATE OF BIRTH:  06-12-1946   DATE OF ADMISSION:  03/14/2004  DATE OF DISCHARGE:  03/21/2004                                 DISCHARGE SUMMARY   ADMISSION DIAGNOSES:  Severe lumbar spinal stenosis L3-4, L4-5.   PROCEDURES:  Decompressive laminectomy and posterior lumbar __________ at L3-  4, L4-5.   SECONDARY DIAGNOSES:  1.  Hypertension.  2.  Urinary incontinence.  3.  Gastroesophageal reflux.  4.  Arthritis.   HOSPITAL COURSE:  The patient is a very pleasant 65 year old female who has  had long standing lumbar spinal stenosis, bilateral leg pain and severe  mechanical low back pain.  The patient's __________ lumbar spinal stenosis.  The patient was recommended decompression effusion.  The patient was  admitted to [facility], went to the operating room where he underwent this  procedure.  Postop, the patient did very well and recovered on the floor.  On the floor, the patient was afebrile postoperatively with significantly  improved leg pain, but having a lot of back pain.  Her Foley was able to be  taken out.  Her BUN and creatinine postoperatively were 29 and 1.9.  This  was felt to be partially due to dehydration.  The patient increased her  hydration ratio and progressively mobilized.  Postop hematocrit was stable  and sodium was stable.  Over the next couple of days, the patient was able  to be transferred out from the stepdown area to the floor with physical  therapy.  BUN and creatinine normalized.  Sodium became subtly low at 133.  However, this rebounded well.  She continued to work well with physical  therapy and got progressively more mobile.  However, she still required a  fair amount of assistance.  Pain control became more easily managed on p.o.  pain  medications.  Rehabilitation was consulted, and the patient was  accepted for transfer to [facility].  At the time of transfer, the patient was  ambulating with assistance.  She was able to void, though she was still  having some problems with this, however, was improving.  Pain was controlled  on pills, and she was receiving therapy.  So, she was transferred to [facility] on  July 27 with follow up neurosurgery in two weeks.       GC/MEDQ  D:  05/18/2004  T:  05/18/2004  Job:  [id number]

## 2011-01-11 NOTE — H&P (Signed)
NAMEDAVID, TOWSON                 ACCOUNT NO.:  [id number]   MEDICAL RECORD NO.:  [id number]          PATIENT TYPE:  INP   LOCATION:  3030                         FACILITY:  [facility]   PHYSICIAN:  Corinna L. Lendell Caprice, MDDATE OF BIRTH:  1946/08/02   DATE OF ADMISSION:  11/13/2005  DATE OF DISCHARGE:                                HISTORY & PHYSICAL   CHIEF COMPLAINT:  Shortness of breath and chills.   HISTORY OF PRESENT ILLNESS:  Mrs. Fabio is a unassigned 65 year old white  female, previously known to me from recent admission. She reports that she  has not been feeling well essentially since her initial admission at the  beginning of March. She has had a cough, mainly non-productive but she has  produced some scant sputum.  Occasionally she has had subjective fever and  chills.  Her appetite has been poor.  She has been short of breath.  She was  admitted on March 3 and treated for pneumonia.  Subsequently discharged home  and readmitted on March 5 at which time she had congestive heart failure  secondary to uncontrolled hypertension.  She has not followed up with  [facility] as instructed after discharge.  Nor has she made an  appointment to do so.  She reports compliance with her medications,  however,.  she reports that she occasionally has choking problems after  eating.   PAST MEDICAL HISTORY:  Reviewed and as per previous dictation.   MEDICATIONS:  As per previous discharge summary.   SOCIAL HISTORY/FAMILY HISTORY:  As per previous dictations.   REVIEW OF SYSTEMS:  As above.  Otherwise negative.   PHYSICAL EXAMINATION:  VITAL SIGNS:  Temperature 98, blood pressure 118/84,  pulse 63, respiratory rate 20, oxygen saturation 96% on room air.  GENERAL:  The patient is a tremulous, anxious-appearing white female.  She  is able to speak in full sentences.  HEENT:  Normocephalic, atraumatic.  Pupils are equal, round and reactive to  light.  Sclerae nonicteric.  Moist  mucous membranes.  NECK:  Supple.  No thyromegaly.  No JVD.  LUNGS:  Clear to auscultation bilaterally without wheezes, rhonchi or rales.  However, apparently she had wheezing when she came in.  CARDIOVASCULAR:  Regular rate and rhythm without murmurs, gallops or rubs.  ABDOMEN:  Normal bowel sounds.  Soft, nontender, nondistended.  GU/RECTAL:  Deferred.  EXTREMITIES:  No clubbing, cyanosis or edema.  SKIN:  No rash.  NEUROLOGIC: Alert and oriented.  Cranial nerves and sensory motor exam are  intact.   LABORATORY DATA:  White blood cell count 6000, hemoglobin 11.1, hematocrit  34, platelet count 181.  Urinalysis is negative.  CMET is pending.  Two  views of the chest show right middle lobe pneumonia with interval diffuse  peribronchial thickening, new from chest x-ray on March 8.  The March 2  chest x-ray showed a left lower lung pneumonia.   ASSESSMENT/PLAN:  1.  Pneumonia, recurrent, now right-sided with previous left-sided pneumonia      treated with Avelox:  As the patient has been hospitalized several times  recently, I will treat for hospital-acquired pathogens with Zosyn and      vancomycin.  I will also get a modified barium swallow to rule out      silent aspiration as the cause of these recurrent pneumonias.  I do not      think she needs a CT scan of the chest. She had one on March 5.  I will      give oxygen and nebulizers as needed.  2.  History of congestive heart failure secondary to accelerated      hypertension and diastolic dysfunction, preserved ejection fraction.  3.  Anxiety.  4.  Gastroesophageal reflux disease.  5.  Hypertension, currently controlled.  6.  Chronic cough.  7.  Chronic pain.  8.  History of chronic tremor.  9.  Hypothyroidism.  10. 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  11. Urinary incontinence being worked up by Dr. Etta Grandchild.  12. Reported history of abdominal  aortic aneurysm, measuring 3.5 x 3.6 cm.      Corinna L. Lendell Caprice, MD  Electronically Signed     CLS/MEDQ  D:  11/12/2005  T:  11/13/2005  Job:  [id number]

## 2011-01-11 NOTE — H&P (Signed)
[name], [name]                 ACCOUNT NO.:  [id number]   MEDICAL RECORD NO.:  [id number]          PATIENT TYPE:  EMS   LOCATION:  ED                           FACILITY:  [facility] [place]   PHYSICIAN:  Kela Millin, M.D.DATE OF BIRTH:  February 17, 1946   DATE OF ADMISSION:  10/28/2005  DATE OF DISCHARGE:                                HISTORY & PHYSICAL   PRIMARY CARE PHYSICIAN:  [facility], [place] R. Rankins, M.D.   CHIEF COMPLAINT:  Shortness of breath, PND.   HISTORY OF PRESENT ILLNESS:  The patient is a 64 year old white female, well-  known to me from recent hospitalization for treatment of left lower lobe  pneumonia, and other past medical history of GERD, hypertension,  hypothyroidism and essential tremors who presents with above complaints.  The patient reports that following her discharge she was unable to lay down  flat because of shortness of breath and also admitted to PND and worsening  shortness of breath.  She states that her cough did not get any worse and  reports slight swelling in her legs.  She denies fevers, dysuria, diarrhea,  melena, hemoptysis, hematemesis.  No melena.   The patient was seen in the ER and a chest x-ray revealed bilateral  infiltrates suggestive of pneumonia and a B natriuretic peptide was elevated  to 517.  The patient's symptoms improved following diuresis with Lasix.  She  is admitted to the [facility] service for further evaluation and  management.   PAST MEDICAL HISTORY:  1.  As stated above.  2.  History of urinary incontinence.  3.  History of anxiety and depression.  4.  History of anemia.   MEDICATIONS:  1.  Gabapentin 100 mg at night.  2.  Darvocet p.r.n.  3.  Clonidine 0.1 mg b.i.d.  4.  Levothyroxine 0.05 mg daily.  5.  Premarin 0.0625 mg daily.  6.  Nexium 40 mg daily.  7.  Atenolol 100 mg daily.  8.  Lexapro 40 mg daily.  9.  Klonopin 1 mg at night.   ALLERGIES:  Intolerances to CODEINE.   SOCIAL HISTORY:   Denies tobacco.  Also denies alcohol.   FAMILY HISTORY:  As per recent history and physical.   REVIEW OF SYSTEMS:  As per HPI.  Other review of systems negative.   PHYSICAL EXAMINATION:  GENERAL:  The patient is an older white female, alert  and oriented, in no apparent distress.  VITAL SIGNS:  Temperature 98.7 with a blood pressure of 163/98, initially  196/101.  Pulse 78, respiratory rate 24. Oxygen saturation 95%.  HEENT:  PERRL.  EOMI.  Moist mucous membranes, no oral exudates.  NECK:  Supple.  No adenopathy.  No JVD appreciated.  LUNGS:  Bibasilar crackles present.  No wheezes.  CARDIOVASCULAR:  Regular rate and rhythm.  Normal S1 and S2.  No S3  appreciated.  ABDOMEN:  Soft. Bowel sounds present.  Nontender, nondistended.  No  organomegaly.  No masses palpable.  EXTREMITIES:  No edema.  No cyanosis.  NEUROLOGIC:  Alert and oriented x3.  Cranial nerves II-XII intact grossly  intact.  Nonfocal exam.   LABORATORY DATA:  Chest x-ray:  Cardiomegaly with interval development of  diffuse interstitial opacities suggestive of edema.  B natriuretic peptide  is 517.  Sodium 139, potassium 3.8, chloride 105, CO2 27, glucose 105, BUN  10, creatinine 1.3, calcium 8.5. White cell count is 8.9 with a hemoglobin  of 10.8, hematocrit 33.5, platelet count 167, neutrophil count of 46%.  The  D-dimer is 0.93.  Point-of-care markers show a CK-MB of 8.2, myoglobin 373,  troponin less than 0.05.   ASSESSMENT/PLAN:  1.  Bilateral lung infiltrates.  Congestive heart failure.  Pneumonia less      likely.  Will diurese. Continue antibiotics.  Obtain a 2-D echo and TSH.      Serial cardiac enzymes.  Follow and consider cardiology consultation      pending enzymes.  2.  Uncontrolled hypertension.  Resume atenolol and clonidine.  3.  Gastroesophageal reflux disease.  Continue proton pump inhibitor.  4.  Hypothyroidism.  Will check TSH as above.  Continue on levothyroxine.  5.  History of essential  tremors. Continue beta blocker.  6.  History of anxiety and depression.  Continue Klonopin and Lexapro.      Kela Millin, M.D.  Electronically Signed     ACV/MEDQ  D:  10/28/2005  T:  10/28/2005  Job:  [id number]   cc:   Fanny Dance. Rankins, M.D.  Fax: [phone]

## 2011-01-11 NOTE — Discharge Summary (Signed)
[name], [name]                           ACCOUNT NO.:  [id number]   MEDICAL RECORD NO.:  [id number]                   PATIENT TYPE:  ORB   LOCATION:  4527                                 FACILITY:  [facility]   PHYSICIAN:  Drucilla Schmidt, P.A.                   DATE OF BIRTH:  09/02/1945   DATE OF ADMISSION:  03/21/2004  DATE OF DISCHARGE:  04/04/2004                                 DISCHARGE SUMMARY   DISCHARGE DIAGNOSES:  1. L3-L4, L4-L5 radical decompression lumbar laminectomy/posterior lumbar     interbody fusion L3-L4 secondary to spinal stenosis.  2. Urinary incontinence.  3. History of hypertension.  4. Anemia.  5. Muscle spasms.  6. Urinary tract infection.   HISTORY OF PRESENT ILLNESS:  The patient is a very pleasant 65 year old  white female with past medical history progressing worsening back pain with  bilateral hip and leg pain which failed conservative care:  Therefore the  patient elected to undergo an L3-L4, L4-L5 radical decompressive lumbar  laminectomy and PLIF of L3-L4 and L4-L5 secondary to lumbar spinal stenosis  and lumbar radiculopathy by Dr. Donalee Citrin on March 14, 2004.  No DVT  prophylaxis.  PT report indicates the patient is ambulating 40 feet with  rolling walker min guide assist and transfer mod assist.  Postoperative  complications include anemia, pain, and urinary frequency.  The patient was  transferred to [facility] Subacute Department on March 21, 2004.   REVIEW OF SYSTEMS:  Significant for urinary incontinence and reflux, joint  pain, weakness, seizures, anxiety, resting tremor.   PAST MEDICAL HISTORY:  1. Hypertension.  2. Peripheral vascular disease.  3. GERD.  4. Ischemic cardiomyopathy.  5. Question TIA.  6. OA.   PAST SURGICAL HISTORY:  Significant for hysterectomy.   FAMILY HISTORY:  Noncontributory.   SOCIAL HISTORY:  The patient lives with a 36 year old grandson in a one-  level apartment, 1-2 steps to entry, independent prior to  admission.  One  son and a daughter-in-law may be able to assist.  She has limited family  support.  Denies any tobacco or alcohol use.  She has one daughter  incarcerated.   MEDICATIONS PRIOR TO ADMISSION:  1. Hyzaar 125 daily.  2. Klonopin 1 mg q.a.m. and q.p.m.  3. Nexium 40 mg daily.  4. Labetalol 20 mg b.i.d.  5. Celexa 40 mg q.h.s.   ALLERGIES:  Codeine.   HOSPITAL COURSE:  [name] was admitted to [facility] Subacute  Department from March 21, 2004 where she received an hour of therapy daily.  Overall Ms. [name] progressed very well during her stay in the subacute and  she remained modified independently at time of discharge.  Surgical incision  healed very well and demonstrated no signs of infection.  We did have to  adjust her pain medications as needed, because she complained of occasional  back pain.  She remained on OxyContin, oxycodone and Neurontin.  On day  prior to discharge the patient did state she had some mild numbness and  tingling for the first time after surgery, radiating down her leg.  She was  started on Neurontin 100 mg p.o. q.h.s., and this was improved.  Other  medical issues occurred while patient was in rehab was urinary incontinence  and elevated blood pressure.  She was initially started on Ditropan on March 21, 2004 of 2.5 mg p.o. q.h.s., this was decreased to 2.5 mg p.o. b.i.d.  with significant improvement in urinary incontinence.  The patient also  initially had received Flexeril 10 mg t.i.d., but the patient began to be  sedated, therefore on March 22, 2004 Flexeril was decreased to 10 mg p.o.  b.i.d.  The patient was started on OxyContin 10 mg q.12 hours on March 22, 2004.  Due to elevated blood pressure on April 02, 2004 Cozaar was increased  from 50 to 100 mg q.h.s.  The patient had mild GI complaints of diarrhea  therefore Senokot was held and she received Imodium as needed.  On March 30, 2004 the patient was found to have an E. coli  urinary tract infection, and  she was treated with Macrodantin 50 mg q.i.d. for a total of 7 days.  C.difficile cultures also obtained, due to the patient's diarrhea and C.  difficile culture was negative.  Once her pain was under fairly good control  the patient did a great improvement, she was able to tolerate therapy very  well, able to don brace independently.  No other medical issues occurred  while the patient was on rehab.  Latest labs, latest CBC on February 21, 2004 --  white blood cell count 7.8, hemoglobin 9.4, hematocrit 27.2, platelet count  304,000.  Latest sodium 138, potassium 3.7, chloride 102, CO2 32, glucose  101, BUN 15, creatinine 1.1, ALT 29, AST 35.  At time of discharge PT report  indicate the patient is able to ambulate approximately modified  independently greater than 200 feet rolling walker, transfers to stand  modified independently, bed mobility modified independently, she was able to  do most ADLs modified independently.  Her incision healed very well,  demonstrated no signs of infection or redness, no discharge noted.  The  patient was discharged home with her family.  At the time of discharge all  vitals were stable.  No significant physical exam findings.  The patient's  discharge medications include Celexa 20 mg at night, resume Hyzaar at an  increased dose of 125 1 tab daily, start April 05, 2004 labetalol 20 mg  twice daily, Nexium 40 mg daily, Premarin 0.625 mg daily, OxyContin follow  taper, Ditropan 5 mg twice daily, Flexeril 5 mg twice daily, Macrodantin 50  mg 4 times daily until April 09, 2004, Neurontin 100 mg at night, oxycodone  5-10 mg every 4-6 hours as needed, and resume Klonopin.  Pain meds are  oxycodone, OxyContin and Tylenol, __________ precaution.  Use wheelchair,  use walker, wear a back brace when walking, standing.  She will receive home  health care for PT and OT.  She will follow up with Dr. Riley Kill as needed. Follow up with Dr.  Francesco Sor 4-6 weeks to check blood pressure.  Follow up  with Dr. Donalee Citrin in 2 weeks.  Drucilla Schmidt, P.A.    LB/MEDQ  D:  04/04/2004  T:  04/05/2004  Job:  [id number]

## 2011-01-11 NOTE — Discharge Summary (Signed)
NAME:  [name], [name] NO.:  [id number]   MEDICAL RECORD NO.:  [id number]                   PATIENT TYPE:  IPS   LOCATION:  0505                                 FACILITY:  [facility]   PHYSICIAN:  Jeanice Lim, M.D.              DATE OF BIRTH:  07/17/1946   DATE OF ADMISSION:  05/18/2002  DATE OF DISCHARGE:  05/22/2002                                 DISCHARGE SUMMARY   IDENTIFYING DATA:  This is a 65 year old widowed Caucasian female  voluntarily admitted brought in by the ambulance after having an episode of  hysterical laughing and crying, feeling out of control.  The patient had a  history of medication noncompliance.  Endorsed depression, lethargy, no  energy and having suicidal ideation.   MEDICATIONS:  Pepcid, Celexa, Klonopin and atenolol.   ALLERGIES:  CODEINE.   PHYSICAL EXAMINATION:  Essentially within normal limits.  Neurologically  nonfocal.   LABORATORY DATA:  CBC and CMET were within normal limits.   MENTAL STATUS EXAM:  Obese, disheveled, white female with poor eye contact.  Psychomotor slowing, dulled affect, tearful.  Speech within normal limits.  Mood depressed.  Hopeless, helpless, worthless, feeling overwhelmed with  debt and frustrated with daughter.  Affect restricted.  Thought process with  some latency.  Thought processes mostly goal directed and possible auditory  hallucinations and suicidal ideation without plan.  Cognition intact.  Poor  insight.   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   AXIS II:  None.   AXIS III:  1. Hypertension.  2. Chronic back pain.   AXIS IV:  Severe (problems with limited support system).   AXIS V:  29/60.   HOSPITAL COURSE:  The patient was admitted and ordered routine p.r.n.  medications, underwent further monitoring and was encouraged to participate  in individual, group and milieu therapy.  Routine medical  monitoring was  continued and patient was restarted on Celexa, Klonopin, Nexium, atenolol,  hydrochlorothiazide, Seroquel p.r.n. agitation.  The patient was placed on  fall risk precautions and Celexa was optimized targeting depressive symptoms  and Seroquel optimized to restore sleep and target mild possible psychotic  features.  The patient also was restarted on Premarin.  The patient  participated in aftercare planning.  Family was contacted and patient  exhibited a positive response to clinical intervention.   CONDITION ON DISCHARGE:  Improved.  Mood was more euthymic.  Affect  brighter.  Thought processes goal directed.  Thought content negative for  dangerous ideation or psychotic symptoms.  The patient reported motivation  to be compliant with medications and follow up with aftercare planning.   DISCHARGE MEDICATIONS:  1. Prinivil 10 mg q.a.m.  2. Tenormin 50 mg q.a.m. and q.h.s.  3. Premarin 0.625 mg q.a.m.  4. Vioxx 25 mg q.a.m.  5. Nexium 30 mg q.a.m.  6.  Hydrochlorothiazide 25 mg q.a.m.  7. Klonopin 0.5 mg b.i.d.  8. Celexa 20 mg, 2-1/2 q.a.m.  9. Seroquel 100 mg q.h.s.   FOLLOW UP:  Dr. Lourdes Sledge on June 04, 2002 at 1 p.m. and [facility] with Darrold Junker for therapy.   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   AXIS II:  None.   AXIS III:  1. Hypertension.  2. Chronic back pain.   AXIS IV:  Severe (problems with limited support system).   AXIS V:  Global Assessment of Functioning on discharge 50-55.                                                Jeanice Lim, M.D.    JEM/MEDQ  D:  06/21/2002  T:  06/21/2002  Job:  [id number]

## 2011-01-11 NOTE — Discharge Summary (Signed)
[name], [name]                 ACCOUNT NO.:  [id number]   MEDICAL RECORD NO.:  [id number]          PATIENT TYPE:  INP   LOCATION:  3030                         FACILITY:  [facility]   PHYSICIAN:  Deirdre Peer. Polite, M.D. DATE OF BIRTH:  17-Feb-1946   DATE OF ADMISSION:  11/13/2005  DATE OF DISCHARGE:  11/15/2005                                 DISCHARGE SUMMARY   DISCHARGE DIAGNOSES:  1.  Right middle lobe pneumonia suspicious for aspiration pneumonia, oral      antibiotics and afebrile.  2.  Dysphagia.  Per a speech evaluation, recommend a dysphagia 3 diet with      thin liquids.  Diet to be reviewed with patient prior to discharge and      to have home health RN to follow up for compliance.  3.  Chronic cough.  4.  Chronic obstructive pulmonary disease.  5.  Hypertension.  6.  Depression.  7.  Tremor.  8.  Hypothyroidism.   DISCHARGE MEDICATIONS:  1.  Augmentin 500 mg twice a day x 10 more days.  2.  Albuterol MDI, 2 puffs every 6 hours.  3.  Humibid LA 600 mg twice a day.  4.  Tussionex 5 ml twice a day x 6 days.  5.  The patient has been evaluated for home oxygen with a pulse oximetry of      94-96% on room air.  6.  The patient to continue other home medications, atenolol 100 mg daily,      clonazepam 0.2 mg twice a day, __________ 800 mg every bedtime,      hydralazine 10 mg three times a day, levothyroxine 0.05 mg daily, Nexium      40 mg daily, Premarin 0.625 mg daily, Lexapro 20 mg daily.   DISPOSITION:  The patient is being discharged home in stable condition.  Again, will have home health RN to follow up for compliance of dysphagia 3  diet.  Patient to have follow up with her primary MD in approximately 1-2  weeks.   STUDIES:  The patient had a chest x-ray which showed right middle lobe  pneumonia, mild bronchial changes, borderline cardiomegaly.  The patient had  a CT of the head negative for acute bleed.  __________ does show chronic  small vessel ischemic changes  in the white matter, no acute abnormality.  BMET creatinine at discharge 1.7.  CBC, white count within normal limits.   HISTORY OF PRESENT ILLNESS:  A 65 year old female with multiple medical  problems presented to the hospital with complaints of shortness of breath  and chills.  Of note, the patient recently had been discharged from the  hospital for pneumonia.  In the ED, the patient was evaluated and found to  have x-ray consistent with right middle lobe pneumonia.  Admission was  deemed necessary for further evaluation and treatment.  Please see dictated  H & P for further details.   PAST MEDICAL HISTORY:  Per admission H & P.   MEDICATIONS:  As stated above.   SOCIAL HISTORY:  Per admission H & P.  HOSPITAL COURSE:  The patient was admitted to the medical floor today for  evaluation and treatment of pneumonia.  Because of the patient's recent  hospital for pneumonia and right middle lobe pneumonia on x-ray, aspiration  pneumonia was suspected.  The patient was seen in consultation by speech and  the patient was found to have mild, mostly pharyngeal dysphagia, consistent  with delayed swallow initiation, decreased sensation, and incoordination  with the swallowing resulting in trace penetration of thin liquids.  The  patient was recommended to be on a dysphagia 3 diet with thin liquids and be  placed on precautions.  Recommend to eat only when alert, seated at 90  degrees, no straws, small bites with sips, follow solid with liquid.  Initially, the patient was started on intravenous antibiotics and ultimately  switched over to oral antibiotics and remained afebrile.  The patient did  have a CT of the head to rule out CVA as the cause for her dysphagia, CT  without any acute abnormality.  The patient neuro exam otherwise essentially  nonfocal.  The patient had a follow-up evaluation by speech therapy.  Recommend continue same diet with consistence.  The patient did have an   ambulatory pulse oximetry to rule out the need for home oxygen.  The  patient's ambulatory pulse oximetry was 94-96% on room air.  At this time,  the patient is afebrile, hemodynamically stable, tolerant on oral  antibiotics without O2 requirement.  Recommend continue treatment as  outlined and feel the patient is stable for discharge to home.  Because of  the new diagnosis of dysphagia, recommend patient to have home health RN to  further follow up with compliance of dysphagia 3 diet.  I have also  discussed with the patient the potential need to decrease her clonazepam and  to try to limit her use of the Tussionex which contains __________ which  more than likely is contributing to some component of her lethargy.  At this  time, the patient is felt to be medically stable for discharge.  She has  several other medical problems and will continue medications as stated  above.      Deirdre Peer. Polite, M.D.  Electronically Signed     RDP/MEDQ  D:  11/15/2005  T:  11/18/2005  Job:  [id number]

## 2013-08-13 ENCOUNTER — Ambulatory Visit: Payer: Self-pay | Admitting: Neurology

## 2013-08-13 ENCOUNTER — Encounter: Payer: Self-pay | Admitting: Neurology

## 2013-08-13 ENCOUNTER — Ambulatory Visit (INDEPENDENT_AMBULATORY_CARE_PROVIDER_SITE_OTHER): Payer: Medicare Other | Admitting: Neurology

## 2013-08-13 VITALS — BP 150/80 | HR 80 | Temp 98.1°F | Ht 64.0 in | Wt 246.0 lb

## 2013-08-13 DIAGNOSIS — F444 Conversion disorder with motor symptom or deficit: Secondary | ICD-10-CM

## 2013-08-13 DIAGNOSIS — F329 Major depressive disorder, single episode, unspecified: Secondary | ICD-10-CM

## 2013-08-13 DIAGNOSIS — F458 Other somatoform disorders: Secondary | ICD-10-CM

## 2013-08-13 DIAGNOSIS — R569 Unspecified convulsions: Secondary | ICD-10-CM

## 2013-08-13 NOTE — Patient Instructions (Addendum)
I don't think you have seizures.  I think your tremor is related to anxiety.  I recommended that you be seen by both psychiatry and psychologist where you can get counseling as well.  I will send this note to Marletta Lor as well.

## 2013-08-13 NOTE — Progress Notes (Signed)
NEUROLOGY CONSULTATION NOTE  [name] MRN: [id number] DOB: 03-19-1946  Referring provider: Marletta Lor, ANP-BC Primary care provider: Marletta Lor, ANP-BC  Reason for consult:  History of seizures  HISTORY OF PRESENT ILLNESS: [name] is a 67 year old right-handed woman with history of L3-5 posterior lumbar interbody fusion, hypertension, depression and history of seizures, who presents for evaluation of tremor and seizures.  Records and images were personally reviewed where available.   She moved to [facility] in August but was treated in [place], [place] before by neurology.  She said she was diagnosed with seizures.  Onset of tremors started about 5 years ago.  She developed a tremor primarily of the head and chin.  She would also get these episodes of increased shaking.  She would feel a strange sensation in her stomach and then her head would shake hard back and forth with significant shaking of the arms and legs.  She does not lose awareness or bowel or bladder function during the spells.  They last a couple of minutes or so.  She does not recall any specific triggers.  It occurs any time of day but does not occur while she is asleep.  She denies family history of tremor.  She was seen by a neurologist in [place] and reportedly had a workup, including brain imaging and EEG, which were unremarkable.  Her neurologist gave her gabapentin, but it was ineffective and caused leg swelling so she stopped.  She also has significant depression and has been treated back in [place].  She is on Celexa and was given Klonopin too.  She currently is not seen by behavioral health.  She also takes atenolol for blood pressure.  She has no prior history of seizures. Marland Kitchen  PAST MEDICAL HISTORY: Past Medical History  Diagnosis Date  . Tremor   . Bowel incontinence   . Bladder incontinence   . High blood pressure     PAST SURGICAL HISTORY: Past Surgical History  Procedure Laterality Date  . Back  surgery    . Total hip arthroplasty    . Abdominal hysterectomy    . Ovarian cysts      MEDICATIONS: No current outpatient prescriptions on file prior to visit.   No current facility-administered medications on file prior to visit.    ALLERGIES: Allergies  Allergen Reactions  . Codeine     FAMILY HISTORY: No family history on file.  SOCIAL HISTORY: History   Social History  . Marital Status: Widowed    Spouse Name: N/A    Number of Children: N/A  . Years of Education: N/A   Occupational History  . Not on file.   Social History Main Topics  . Smoking status: Never Smoker   . Smokeless tobacco: Never Used  . Alcohol Use: No  . Drug Use: No  . Sexual Activity: Not on file   Other Topics Concern  . Not on file   Social History Narrative  . No narrative on file    REVIEW OF SYSTEMS: Constitutional: Fatigue Eyes: No visual changes, double vision, eye pain Ear, nose and throat: No hearing loss, ear pain, nasal congestion, sore throat Cardiovascular: No chest pain, palpitations Respiratory:  No shortness of breath at rest or with exertion, wheezes GastrointestinaI: No nausea, vomiting, diarrhea, abdominal pain, fecal incontinence Genitourinary:  No dysuria, urinary retention or frequency Musculoskeletal:  No neck pain, back pain Integumentary: No rash, pruritus, skin lesions Neurological: as above Psychiatric: Depression, anxiety Endocrine: No  palpitations, fatigue, diaphoresis, mood swings, change in appetite, change in weight, increased thirst Hematologic/Lymphatic:  No anemia, purpura, petechiae. Allergic/Immunologic: no itchy/runny eyes, nasal congestion, recent allergic reactions, rashes  PHYSICAL EXAM: Filed Vitals:   08/13/13 1052  BP: 150/80  Pulse: 80  Temp: 98.1 F (36.7 C)   General: No acute distress Head:  Normocephalic/atraumatic Neck: supple, no paraspinal tenderness, full range of motion Back: No paraspinal tenderness Heart: regular  rate and rhythm Lungs: Clear to auscultation bilaterally. Vascular: No carotid bruits. Neurological Exam: Mental status: alert and oriented to person, place, and time, speech fluent and not dysarthric, language intact. Cranial nerves: CN I: not tested CN II: pupils equal, round and reactive to light, visual fields intact, fundi unremarkable. CN III, IV, VI:  full range of motion, no nystagmus, no ptosis CN V: facial sensation intact CN VII: upper and lower face symmetric CN VIII: hearing intact CN IX, X: gag intact, uvula midline CN XI: sternocleidomastoid and trapezius muscles intact CN XII: tongue midline Bulk & Tone: normal, no fasciculations. Motor: 5/5 throughout.  No rigidity or bradykinesia.  Tremor of the head and chin, with fluctuation in amplitude and reduced with distraction. Sensation: pinprick and vibration intact Deep Tendon Reflexes: 2+ throughout, toes down Finger to nose testing: no tremor or dysmetria Gait: wide-based, no ataxia. Romberg negative. I observed one of her habitual spells.  She began to have violent shaking of her head back and forth with arching of her back and shaking of her arms and legs. It lasted about 2 minutes.  She was fully aware and able to follow commands.  IMPRESSION: Psychogenic tremor Non-epileptic spells  PLAN: Based on the fact that her head tremor changes in amplitude and improves when she is distracted, suggests these are psychogenic and not organic.  I observed one of her habitual spells, which did not look epileptic.  This, in conjunction with her reported normal workup, leads me to believe these psychogenic as well.  She note severe depression and anxiety.  I feel that appropriate management would be to address these issues.  I recommend referral to psychiatry as well as psychology for counseling and therapy.  No further neurological workup or management needed.  45 minutes spent with patient, over 50% spent counseling and coordinating  care.  Thank you for allowing me to take part in the care of this patient.  Ashley Millet, DO  CC:  Marletta Lor, ANP-BC

## 2013-08-16 DIAGNOSIS — C50919 Malignant neoplasm of unspecified site of unspecified female breast: Secondary | ICD-10-CM

## 2013-08-16 HISTORY — DX: Malignant neoplasm of unspecified site of unspecified female breast: C50.919

## 2013-08-17 ENCOUNTER — Other Ambulatory Visit: Payer: Self-pay | Admitting: Radiology

## 2013-08-17 DIAGNOSIS — C50912 Malignant neoplasm of unspecified site of left female breast: Secondary | ICD-10-CM

## 2013-08-18 ENCOUNTER — Telehealth: Payer: Self-pay | Admitting: *Deleted

## 2013-08-18 DIAGNOSIS — C50412 Malignant neoplasm of upper-outer quadrant of left female breast: Secondary | ICD-10-CM | POA: Insufficient documentation

## 2013-08-18 NOTE — Telephone Encounter (Signed)
Confirmed [facility] for 09/01/13 at 12N.  Instructions and contact information given. 

## 2013-08-26 HISTORY — PX: BREAST LUMPECTOMY: SHX2

## 2013-08-31 ENCOUNTER — Ambulatory Visit
Admission: RE | Admit: 2013-08-31 | Discharge: 2013-08-31 | Disposition: A | Discharge status: Home / Self Care | Payer: Medicare Other | Source: Ambulatory Visit | Attending: Radiology | Admitting: Radiology

## 2013-08-31 DIAGNOSIS — C50912 Malignant neoplasm of unspecified site of left female breast: Secondary | ICD-10-CM

## 2013-08-31 MED ORDER — GADOBENATE DIMEGLUMINE 529 MG/ML IV SOLN
20.0000 mL | Freq: Once | INTRAVENOUS | Status: AC | PRN
  Administered 2013-08-31: 20 mL via INTRAVENOUS

## 2013-09-01 ENCOUNTER — Ambulatory Visit
Admission: RE | Admit: 2013-09-01 | Discharge: 2013-09-01 | Disposition: A | Discharge status: Home / Self Care | Payer: Medicare Other | Source: Ambulatory Visit | Attending: Radiation Oncology | Admitting: Radiation Oncology

## 2013-09-01 ENCOUNTER — Ambulatory Visit (HOSPITAL_BASED_OUTPATIENT_CLINIC_OR_DEPARTMENT_OTHER): Payer: Medicare Other | Admitting: Oncology

## 2013-09-01 ENCOUNTER — Ambulatory Visit: Payer: Medicare Other

## 2013-09-01 ENCOUNTER — Encounter: Payer: Self-pay | Admitting: *Deleted

## 2013-09-01 ENCOUNTER — Other Ambulatory Visit (HOSPITAL_BASED_OUTPATIENT_CLINIC_OR_DEPARTMENT_OTHER): Payer: Medicare Other

## 2013-09-01 ENCOUNTER — Ambulatory Visit (HOSPITAL_BASED_OUTPATIENT_CLINIC_OR_DEPARTMENT_OTHER): Payer: Medicare Other | Admitting: General Surgery

## 2013-09-01 ENCOUNTER — Ambulatory Visit: Payer: Medicare Other | Attending: General Surgery | Admitting: Physical Therapy

## 2013-09-01 ENCOUNTER — Encounter: Payer: Self-pay | Admitting: Oncology

## 2013-09-01 VITALS — BP 138/73 | HR 54 | Temp 97.6°F | Resp 18 | Ht 64.0 in | Wt 247.0 lb

## 2013-09-01 DIAGNOSIS — Z170 Estrogen receptor positive status [ER+]: Secondary | ICD-10-CM

## 2013-09-01 DIAGNOSIS — I10 Essential (primary) hypertension: Secondary | ICD-10-CM | POA: Insufficient documentation

## 2013-09-01 DIAGNOSIS — C50419 Malignant neoplasm of upper-outer quadrant of unspecified female breast: Secondary | ICD-10-CM

## 2013-09-01 DIAGNOSIS — IMO0001 Reserved for inherently not codable concepts without codable children: Secondary | ICD-10-CM | POA: Insufficient documentation

## 2013-09-01 DIAGNOSIS — C50919 Malignant neoplasm of unspecified site of unspecified female breast: Secondary | ICD-10-CM

## 2013-09-01 DIAGNOSIS — C50412 Malignant neoplasm of upper-outer quadrant of left female breast: Secondary | ICD-10-CM

## 2013-09-01 DIAGNOSIS — R293 Abnormal posture: Secondary | ICD-10-CM | POA: Insufficient documentation

## 2013-09-01 DIAGNOSIS — Z96649 Presence of unspecified artificial hip joint: Secondary | ICD-10-CM | POA: Insufficient documentation

## 2013-09-01 DIAGNOSIS — M6281 Muscle weakness (generalized): Secondary | ICD-10-CM | POA: Insufficient documentation

## 2013-09-01 DIAGNOSIS — C50912 Malignant neoplasm of unspecified site of left female breast: Secondary | ICD-10-CM

## 2013-09-01 LAB — CBC WITH DIFFERENTIAL/PLATELET
BASO%: 0.6 % (ref 0.0–2.0)
Basophils Absolute: 0 10*3/uL (ref 0.0–0.1)
EOS%: 1.8 % (ref 0.0–7.0)
Eosinophils Absolute: 0.1 10*3/uL (ref 0.0–0.5)
HEMATOCRIT: 46.9 % — AB (ref 34.8–46.6)
HGB: 15.6 g/dL (ref 11.6–15.9)
LYMPH#: 3 10*3/uL (ref 0.9–3.3)
LYMPH%: 40.3 % (ref 14.0–49.7)
MCH: 32.1 pg (ref 25.1–34.0)
MCHC: 33.3 g/dL (ref 31.5–36.0)
MCV: 96.5 fL (ref 79.5–101.0)
MONO#: 0.5 10*3/uL (ref 0.1–0.9)
MONO%: 6 % (ref 0.0–14.0)
NEUT#: 3.9 10*3/uL (ref 1.5–6.5)
NEUT%: 51.3 % (ref 38.4–76.8)
Platelets: 143 10*3/uL — ABNORMAL LOW (ref 145–400)
RBC: 4.86 10*6/uL (ref 3.70–5.45)
RDW: 14.7 % — AB (ref 11.2–14.5)
WBC: 7.5 10*3/uL (ref 3.9–10.3)

## 2013-09-01 LAB — COMPREHENSIVE METABOLIC PANEL (CC13)
ALBUMIN: 3.7 g/dL (ref 3.5–5.0)
ALT: 16 U/L (ref 0–55)
ANION GAP: 10 meq/L (ref 3–11)
AST: 17 U/L (ref 5–34)
Alkaline Phosphatase: 48 U/L (ref 40–150)
BUN: 18.8 mg/dL (ref 7.0–26.0)
CALCIUM: 9.2 mg/dL (ref 8.4–10.4)
CHLORIDE: 104 meq/L (ref 98–109)
CO2: 30 meq/L — AB (ref 22–29)
Creatinine: 1.3 mg/dL — ABNORMAL HIGH (ref 0.6–1.1)
Glucose: 82 mg/dl (ref 70–140)
POTASSIUM: 4.2 meq/L (ref 3.5–5.1)
Sodium: 144 mEq/L (ref 136–145)
Total Bilirubin: 0.78 mg/dL (ref 0.20–1.20)
Total Protein: 7.1 g/dL (ref 6.4–8.3)

## 2013-09-01 NOTE — Assessment & Plan Note (Signed)
Patient has a new diagnosis of left breast cancer. This appears to be a T1 N0 cancer. We will plan breast conservation on her.  She'll need a needle localized lumpectomy with sentinel lymph node biopsy on the left. She has discussed radiation with Dr. Valere Dross.  The surgical procedure was described to the patient.  I discussed the incision type and location and that we would need radiology involved on the day of surgery with a wire marker and sentinel node injection.   We may be able to get breast specimen and lymph node from same incision depending on wire placement.       The risks and benefits of the procedure were described to the patient and he/she wishes to proceed.    We discussed the risks bleeding, infection, damage to other structures, need for further procedures/surgeries.  We discussed the risk of seroma.  The patient was advised if the area in the breast in cancer, we may need to go back to surgery for additional tissue to obtain negative margins or for a lymph node biopsy. The patient was advised that these are the most common complications, but that others can occur as well.  They were advised against taking aspirin or other anti-inflammatory agents/blood thinners the week before surgery.    45 min spent in evaluation, examination, counseling, and coordination of care.  >50% spent with counseling.

## 2013-09-01 NOTE — Progress Notes (Signed)
Checked in new pt with no financial concerns. °

## 2013-09-01 NOTE — Patient Instructions (Signed)
Lumpectomy A lumpectomy is a form of "breast conserving" or "breast preservation" surgery. It may also be referred to as a partial mastectomy. During a lumpectomy, the portion of the breast that contains the cancerous tumor or breast mass (the lump) is removed. Some normal tissue around the lump may also be removed to make sure all the tumor has been removed. This surgery should take 40 minutes or less. LET [facility] CARE PROVIDER KNOW ABOUT:  Any allergies you have.  All medicines you are taking, including vitamins, herbs, eye drops, creams, and over-the-counter medicines.  Previous problems you or members of your family have had with the use of anesthetics.  Any blood disorders you have.  Previous surgeries you have had.  Medical conditions you have. RISKS AND COMPLICATIONS Generally, this is a safe procedure. However, as with any procedure, complications can occur. Possible complications include:  Bleeding.  Infection.  Pain.  Temporary swelling.  Change in the shape of the breast, particularly if a large portion is removed. BEFORE THE PROCEDURE  Ask your health care provider about changing or stopping your regular medicines.  Do not eat or drink anything for 7 8 hours before the surgery or as directed by your health care provider. Ask your health care provider if you can take a sip of water with any approved medicines.  On the day of surgery, your healthcare provider will use a mammogram or ultrasound to locate and mark the tumor in your breast. These markings on your breast will show where the cut (incision) will be made. PROCEDURE   An IV tube will be put into one of your veins.  You may be given medicine to help you relax before the surgery (sedative). You will be given one of the following:  A medicine that numbs the area (local anesthesia).  A medicine that makes you go to sleep (general anesthesia).  Your health care provider will use a kind of electric scalpel  that uses heat to minimize bleeding (electrocautery knife).  A curved incision (like a smile or frown) that follows the natural curve of your breast is made, to allow for minimal scarring and better healing.  The tumor will be removed with some of the surrounding tissue. This will be sent to the lab for analysis. Your health care provider may also remove your lymph nodes at this time if needed.  Sometimes, but not always, a rubber tube called a drain will be surgically inserted into your breast area or armpit to collect excess fluid that may accumulate in the space where the tumor was. This drain is connected to a plastic bulb on the outside of your body. This drain creates suction to help remove the fluid.  The incisions will be closed with stitches (sutures).  A bandage may be placed over the incisions. AFTER THE PROCEDURE  You will be taken to the recovery area.  You will be given medicine for pain.  A small rubber drain may be placed in the breast for 2 3 days to prevent a collection of blood (hematoma) from developing in the breast. You will be given instructions on caring for the drain before you go home.  A pressure bandage (dressing) will be applied for 1 2 days to prevent bleeding. Ask your health care provider how to care for your bandage at home.     Sentinel Lymph Node Biopsy Sentinel lymph node biopsy is a procedure in which a single lymph node is identified, removed, and examined for cancer.  Lymph nodes are collections of tissue that help filter infections, cancer cells, and other waste substances from the bloodstream. Certain types of cancer can spread to nearby lymph nodes. The cancer spreads to one lymph node first, and then to others. The first lymph node that your cancer could spread to is called the sentinel lymph node. Examining the sentinel lymph node for cancer can help your caregiver plan future treatment for you. LET YOUR CAREGIVER KNOW ABOUT:   Allergies to food  or medicine.  Medicines taken, including vitamins, herbs, eyedrops, over-the-counter medicines, and creams.  Use of steroids (by mouth or creams).  Previous problems with numbing medicines.  History of bleeding problems or blood clots.  Previous surgery.  Other health problems, including diabetes and kidney problems.  Possibility of pregnancy, if this applies. RISKS AND COMPLICATIONS   Infection.  Bleeding.  Allergic reaction to the dye used for the procedure.  Blue staining of the skin where the dye is injected.  Damaged lymph vessels, causing a buildup of fluid (lymphedema).  Pain or bruising at the biopsy site. BEFORE THE PROCEDURE   Stop smoking at least 2 weeks before the procedure. Not smoking will improve your health after the procedure and decrease the chance of getting a wound infection.  You may have blood tests to make sure your blood clots normally.  Ask your caregiver about changing or stopping your regular medicines.  Do not eat or drink anything for 8 hours before the procedure. PROCEDURE   You will be given medicine that makes you sleep (general anesthetic).  A blue, radioactive dye will be injected near the tumor. The dye will then spread into the sentinel lymph node.  A scanner will identify the sentinel lymph node.  A small cut (incision) will be made, and the sentinel lymph node will be removed.  The sentinel lymph node will be examined in a lab. Sometimes, a sentinel lymph node biopsy is performed during another surgery, such as a mastectomy or lumpectomy for breast cancer.  AFTER THE PROCEDURE   You will go to a recovery room.  You will be monitored for several hours.  If complications do not occur, you will be allowed to go home a few hours after the procedure.  Your urine may be blue for the next 24 hours. This is normal. It is caused by the dye used during the procedure.  Your skin where the dye was injected may be blue for up to 8  weeks.

## 2013-09-01 NOTE — Progress Notes (Signed)
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 female who is seen today at the [facility] for the courtesy Dr. 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 At the time of a screening mammogram on 08/09/2013 at [facility] she was felt to have a mass at 1:00 along the left breast suggestive of malignancy. Ultrasound showed a 0.5 x 0.9 cm mass within the upper-outer quadrant. An ultrasound-guided biopsy on 08/16/2013 was diagnostic for invasive ductal/DCIS. The invasive carcinoma was felt to be grade 1. This was ER/PR positive and HER-2/neu negative. Breast MR on 08/31/2013 showed biopsy changes along the upper-outer quadrant of the left breast with a 7 mm nodular area of enhancement. She had a right-sided breast biopsy at 5:00 which was low suspicion for malignancy. This was benign.  PREVIOUS RADIATION THERAPY: No   PAST MEDICAL HISTORY:  has a past medical history of Tremor; Bowel incontinence; Bladder incontinence; High blood pressure; Anemia; Depression; and Seizures.     PAST SURGICAL HISTORY:  Past Surgical History  Procedure Laterality Date  . Back surgery    . Total hip arthroplasty    . Abdominal hysterectomy    . Ovarian cysts       FAMILY HISTORY: family history includes Lung cancer in her brother. There is no history of Ataxia, Chorea, Dementia, Mental retardation, Migraines, Multiple sclerosis, Neurofibromatosis, Neuropathy, Parkinsonism, Seizures, or Stroke. Her father died from a gunshot wound at age 72. Her mother died following a stroke at 46. No family history of breast  cancer.   SOCIAL HISTORY:  reports that she has never smoked. She has never used smokeless tobacco. She reports that she does not drink alcohol or use illicit drugs.  For the past 15 years, 2 children. 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   ALLERGIES: Codeine   MEDICATIONS:  Current Outpatient Prescriptions  Medication Sig Dispense Refill  . amLODipine (NORVASC) 5 MG tablet Take 5 mg by mouth daily.       Marland Kitchen atenolol (TENORMIN) 50 MG tablet Take 50 mg by mouth daily.       . citalopram (CELEXA) 40 MG tablet Take 40 mg by mouth daily.       . clonazePAM (KLONOPIN) 1 MG tablet Take by mouth 3 (three) times daily as needed.       Marland Kitchen levothyroxine (SYNTHROID, LEVOTHROID) 50 MCG tablet Take 50 mcg by mouth daily before breakfast.       . nystatin cream (MYCOSTATIN) Apply 1 application topically as needed.       . ranitidine (ZANTAC) 150 MG tablet Take 150 mg by mouth 2 (two) times daily.       . simvastatin (ZOCOR) 40 MG tablet Take 40 mg by mouth.        No current facility-administered medications for this encounter.     REVIEW OF SYSTEMS:  Pertinent items are noted in HPI.    PHYSICAL EXAM:  Alert and oriented 68 year old white female appearing her stated age. Wt Readings  from Last 3 Encounters:  09/01/13 247 lb (112.038 kg)  08/13/13 246 lb (111.585 kg)   Temp Readings from Last 3 Encounters:  09/01/13 97.6 F (36.4 C) Oral  08/13/13 98.1 F (36.7 C)    BP Readings from Last 3 Encounters:  09/01/13 138/73  08/13/13 150/80   Pulse Readings from Last 3 Encounters:  09/01/13 54  08/13/13 80   Head and neck examination: Grossly unremarkable. Nodes: Without palpable cervical, supraclavicular, or axillary lymphadenopathy. Chest: Lungs clear. Breasts: There is a punctate biopsy wound along the upper-outer quadrant of the left breast at 2:00. No masses are appreciated. There is a hematoma at approximately 4 to 5:00 along the lower inner quadrant of the right breast.  Abdomen: Without hepatomegaly. Extremities: Without edema.    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 I explained to the patient that her local treatment options include a partial mastectomy followed by radiation therapy were mastectomy. She may be a candidate for hypo-fractionated radiation therapy. We also discussed the potential acute and late toxicities of radiation therapy in the possibility that she may benefit from deep inspiration breath-hold technology to avoid cardiac irradiation.   PLAN: As discussed above. She is interested in breast preservation. She may be a candidate for antiestrogen therapy following completion of radiation therapy.  I spent 30 minutes minutes face to face with the patient and more than 50% of that time was spent in counseling and/or coordination of care.

## 2013-09-01 NOTE — Progress Notes (Signed)
Chief complaint:  Left breast cancer.    HISTORY: Patient is a 68 year old female with a new diagnosis of left breast cancer. She had a screening detected asymmetry noted and some benign changes on the right. She has no family history of breast cancer.  She has not noted any palpable masses or skin dimpling. She has not had prior breast biopsies that she is aware of. She does have some issues with her nerves in her bladder and her spine. She complains of incontinence. She does have a family history of lung cancer in a brother diagnosed in his 62s. She has not ever been a smoker and is not alcohol. She had menarche at age 85. She had a hysterectomy and is not having periods. She did use hormonal contraception for 40 years does not use any hormone replacement. She carried to term to term with her first age at live birth at age 12. She is up-to-date with colonoscopy and a bone density study.  Past Medical History  Diagnosis Date  . Tremor   . Bowel incontinence   . Bladder incontinence   . High blood pressure   . Anemia   . Depression   . Seizures     Past Surgical History  Procedure Laterality Date  . Back surgery    . Total hip arthroplasty    . Abdominal hysterectomy    . Ovarian cysts      Current Outpatient Prescriptions  Medication Sig Dispense Refill  . amLODipine (NORVASC) 5 MG tablet Take 5 mg by mouth daily.       Marland Kitchen atenolol (TENORMIN) 50 MG tablet Take 50 mg by mouth daily.       . citalopram (CELEXA) 40 MG tablet Take 40 mg by mouth daily.       . clonazePAM (KLONOPIN) 1 MG tablet Take by mouth 3 (three) times daily as needed.       Marland Kitchen levothyroxine (SYNTHROID, LEVOTHROID) 50 MCG tablet Take 50 mcg by mouth daily before breakfast.       . nystatin cream (MYCOSTATIN) Apply 1 application topically as needed.       . ranitidine (ZANTAC) 150 MG tablet Take 150 mg by mouth 2 (two) times daily.       . simvastatin (ZOCOR) 40 MG tablet Take 40 mg by mouth.        No current  facility-administered medications for this visit.     Allergies  Allergen Reactions  . Codeine      Family History  Problem Relation Age of Onset  . Ataxia Neg Hx   . Chorea Neg Hx   . Dementia Neg Hx   . Mental retardation Neg Hx   . Migraines Neg Hx   . Multiple sclerosis Neg Hx   . Neurofibromatosis Neg Hx   . Neuropathy Neg Hx   . Parkinsonism Neg Hx   . Seizures Neg Hx   . Stroke Neg Hx   . Lung cancer Brother      History   Social History  . Marital Status: Widowed    Spouse Name: N/A    Number of Children: N/A  . Years of Education: N/A   Social History Main Topics  . Smoking status: Never Smoker   . Smokeless tobacco: Never Used  . Alcohol Use: No  . Drug Use: No  . Sexual Activity: Not on file   Other Topics Concern  . Not on file   Social History Narrative  . No narrative on  file     REVIEW OF SYSTEMS - PERTINENT POSITIVES ONLY: 12 point review of systems negative other than HPI and PMH except for fatigue, pain, easy bruising, seizures, headaches, forgetfulness, anxiety,  depression, and incontinence  EXAM: Wt Readings from Last 3 Encounters:  09/01/13 247 lb (112.038 kg)  08/13/13 246 lb (111.585 kg)   Temp Readings from Last 3 Encounters:  09/01/13 97.6 F (36.4 C) Oral  08/13/13 98.1 F (36.7 C)    BP Readings from Last 3 Encounters:  09/01/13 138/73  08/13/13 150/80   Pulse Readings from Last 3 Encounters:  09/01/13 54  08/13/13 80     Gen:  No acute distress.  Well nourished and well groomed.   Neurological: Alert and oriented to person, place, and time. Coordination normal.  Head: Normocephalic and atraumatic.  Eyes: Conjunctivae are normal. Pupils are equal, round, and reactive to light. No scleral icterus.  Neck: Normal range of motion. Neck supple. No tracheal deviation or thyromegaly present.  Cardiovascular: Normal rate, regular rhythm, normal heart sounds and intact distal pulses.  Exam reveals no gallop and no  friction rub.  No murmur heard. Respiratory: Effort normal.  No respiratory distress. No chest wall tenderness. Breath sounds normal.  No wheezes, rales or rhonchi.  Breast:  No palpable masses, skin dimpling, nipple retraction, nipple discharge, lymphadenopathy appreciable.  There is some bruising on the right at one of the sites of an ultrasound-guided biopsy of a benign lesion. GI: Soft. Bowel sounds are normal. The abdomen is soft and nontender.  There is no rebound and no guarding.  Musculoskeletal: Normal range of motion. Extremities are nontender.  Lymphadenopathy: No cervical, preauricular, postauricular or axillary adenopathy is present Skin: Skin is warm and dry. No rash noted. No diaphoresis. No erythema. No pallor. No clubbing, cyanosis, or edema.   Psychiatric: Normal mood and affect. Behavior is normal. Judgment and thought content normal.    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    RADIOLOGY RESULTS: See E-Chart or I-Site for most recent results.  Images and reports are reviewed. MRI IMPRESSION:  Moderate nodular background parenchymal enhancement bilaterally.  This may limit the sensitivity for detecting malignancy. The biopsy  clip at the site of patient's biopsy proven left upper outer  quadrant malignancy demonstrates an approximately 7 mm nodular area  of enhancement that is very similar to the patient's diffuse  background parenchymal enhancement pattern.  No MRI specific evidence of malignancy in the right breast.   Mammogram/ultrasound 9 mm mass at 1 o'clock seen.  suggenstive of malignancy.  Other areas seen, biopsied, benign.    ASSESSMENT AND PLAN: Breast cancer of upper-outer quadrant of left female breast Patient has a new diagnosis of left breast cancer. This appears to be a T1 N0 cancer. We will plan breast conservation  on her.  She'll need a needle localized lumpectomy with sentinel lymph node biopsy on the left. She has discussed radiation with Dr. Valere Dross.  The surgical procedure was described to the patient.  I discussed the incision type and location and that we would need radiology involved on the day of surgery with a wire marker and sentinel node injection.   We may be able to get breast specimen and lymph node from same incision depending on wire placement.       The risks and benefits of the procedure were  described to the patient and he/she wishes to proceed.    We discussed the risks bleeding, infection, damage to other structures, need for further procedures/surgeries.  We discussed the risk of seroma.  The patient was advised if the area in the breast in cancer, we may need to go back to surgery for additional tissue to obtain negative margins or for a lymph node biopsy. The patient was advised that these are the most common complications, but that others can occur as well.  They were advised against taking aspirin or other anti-inflammatory agents/blood thinners the week before surgery.    45 min spent in evaluation, examination, counseling, and coordination of care.  >50% spent with counseling.        Milus Height MD Surgical Oncology, [facility]      Visit Diagnoses: 1. Breast cancer, left   2. Breast cancer of upper-outer quadrant of left female breast     Primary Care Physician: Alvester Chou, NP  Marcy Panning medical oncology  Arloa Koh radiation oncology  Bary Castilla RN breast navigator.

## 2013-09-02 ENCOUNTER — Encounter: Payer: Self-pay | Admitting: *Deleted

## 2013-09-02 ENCOUNTER — Other Ambulatory Visit (INDEPENDENT_AMBULATORY_CARE_PROVIDER_SITE_OTHER): Payer: Self-pay

## 2013-09-02 DIAGNOSIS — R32 Unspecified urinary incontinence: Secondary | ICD-10-CM

## 2013-09-02 DIAGNOSIS — R159 Full incontinence of feces: Secondary | ICD-10-CM

## 2013-09-02 NOTE — Progress Notes (Signed)
[facility] Psychosocial Distress Screening Clinical Social Work  Clinical Social Work was referred by distress screening protocol.  The patient scored a 9 on the Psychosocial Distress Thermometer which indicates severe distress. Clinical Social Worker met with Pt at her breast clinic appointment to further assess for distress and other psychosocial needs. Pt reports to have a long standing history with anxiety and depression and has been on medication for many years to assist her. Pt here today with her daughter in law and states she moved to the area last fall. She is still getting established with local  Providers and is interested in a counselor. CSW discussed resources at the [facility] to assist as well as other local resources that may be of assistance. Pt denies current SI and states she just  worries a lot. Pt provided with Support Team and Support Group info as well. Pt denies other concerns and reports to have good supports. Pt states she will reach out to CSW as needed. Daughter in law aware as well.    Clinical Social Worker follow up needed: no  Loren Racer, [place] Social Worker [facility] for [place] Wednesday, Thursday and Friday Phone: [phone] Fax: [phone]

## 2013-09-07 ENCOUNTER — Ambulatory Visit: Payer: Medicare Other | Attending: General Surgery | Admitting: Physical Therapy

## 2013-09-07 DIAGNOSIS — IMO0001 Reserved for inherently not codable concepts without codable children: Secondary | ICD-10-CM | POA: Insufficient documentation

## 2013-09-07 DIAGNOSIS — I10 Essential (primary) hypertension: Secondary | ICD-10-CM | POA: Insufficient documentation

## 2013-09-07 DIAGNOSIS — M2420 Disorder of ligament, unspecified site: Secondary | ICD-10-CM | POA: Insufficient documentation

## 2013-09-07 DIAGNOSIS — M629 Disorder of muscle, unspecified: Secondary | ICD-10-CM | POA: Insufficient documentation

## 2013-09-07 DIAGNOSIS — Z96649 Presence of unspecified artificial hip joint: Secondary | ICD-10-CM | POA: Insufficient documentation

## 2013-09-07 DIAGNOSIS — C50919 Malignant neoplasm of unspecified site of unspecified female breast: Secondary | ICD-10-CM | POA: Insufficient documentation

## 2013-09-09 ENCOUNTER — Telehealth: Payer: Self-pay | Admitting: *Deleted

## 2013-09-09 NOTE — Telephone Encounter (Signed)
Called and spoke with patient from [facility] 09/01/13.  No questions or concerns at this time.  Confirmed appointment with Dr. [name] 10/12/13 at 10am.

## 2013-09-12 ENCOUNTER — Encounter: Payer: Self-pay | Admitting: Oncology

## 2013-09-12 NOTE — Progress Notes (Signed)
[name] [id number] 10-21-1945 68 y.o. 09/12/2013 2:35 PM  CC  Alvester Chou, NP [facility] [address] [place] [address] Dr. [name] Dr. Arloa Koh  REASON FOR CONSULTATION:  68 year old with new left breast cancer  STAGE:   Breast cancer of upper-outer quadrant of left female breast   Primary site: Breast (Left)   Staging method: AJCC 7th Edition   Clinical: Stage IA (T1b, N0, cM0)   Summary: Stage IA (T1b, N0, cM0)  REFERRING PHYSICIAN: Dr. [name]  HISTORY OF PRESENT ILLNESS:  [name] is a 68 y.o. female.  Who At the time of a screening mammogram on 08/09/2013 at [facility] she was felt to have a mass at 1:00 along the left breast suggestive of malignancy. Ultrasound showed a 0.5 x 0.9 cm mass within the upper-outer quadrant. An ultrasound-guided biopsy on 08/16/2013 was diagnostic for invasive ductal/DCIS. The invasive carcinoma was felt to be grade 1. This was ER/PR positive and HER-2/neu negative. Breast MR on 08/31/2013 showed biopsy changes along the upper-outer quadrant of the left breast with a 7 mm nodular area of enhancement. She had a right-sided breast biopsy at 5:00 which was low suspicion for malignancy. This was benign.    Past Medical History: Past Medical History  Diagnosis Date  . Tremor   . Bowel incontinence   . Bladder incontinence   . High blood pressure   . Anemia   . Depression   . Seizures     Past Surgical History: Past Surgical History  Procedure Laterality Date  . Back surgery    . Total hip arthroplasty    . Abdominal hysterectomy    . Ovarian cysts      Family History: Family History  Problem Relation Age of Onset  . Ataxia Neg Hx   . Chorea Neg Hx   . Dementia Neg Hx   . Mental retardation Neg Hx   . Migraines Neg Hx   . Multiple sclerosis Neg Hx   . Neurofibromatosis Neg Hx   . Neuropathy Neg Hx   . Parkinsonism Neg Hx   . Seizures Neg Hx   . Stroke Neg Hx   . Lung  cancer Brother     Social History History  Substance Use Topics  . Smoking status: Never Smoker   . Smokeless tobacco: Never Used  . Alcohol Use: No    Allergies: Allergies  Allergen Reactions  . Codeine     Current Medications: Current Outpatient Prescriptions  Medication Sig Dispense Refill  . amLODipine (NORVASC) 5 MG tablet Take 5 mg by mouth daily.       Marland Kitchen atenolol (TENORMIN) 50 MG tablet Take 50 mg by mouth daily.       . citalopram (CELEXA) 40 MG tablet Take 40 mg by mouth daily.       . clonazePAM (KLONOPIN) 1 MG tablet Take by mouth 3 (three) times daily as needed.       Marland Kitchen levothyroxine (SYNTHROID, LEVOTHROID) 50 MCG tablet Take 50 mcg by mouth daily before breakfast.       . nystatin cream (MYCOSTATIN) Apply 1 application topically as needed.       . ranitidine (ZANTAC) 150 MG tablet Take 150 mg by mouth 2 (two) times daily.       . simvastatin (ZOCOR) 40 MG tablet Take 40 mg by mouth.        No current facility-administered medications for this visit.  OB/GYN History:menarche at 56 menopause in 1991, no HRT first live birth at 64  Fertility Discussion:n/a Prior History of Cancer: no  Health Maintenance:  Colonoscopy yes Bone Density yes Last PAP smear unknown  ECOG PERFORMANCE STATUS: 0 - Asymptomatic  Genetic Counseling/testing: no  REVIEW OF SYSTEMS:  14 point ROS is scanned separaely  PHYSICAL EXAMINATION: Blood pressure 138/73, pulse 54, temperature 97.6 F (36.4 C), temperature source Oral, resp. rate 18, height _0  (1.626 m), weight 247 lb (112.038 kg).  JGO:TLXBW, healthy, no distress, well nourished and well developed SKIN: skin color, texture, turgor are normal HEAD: Normocephalic EYES: PERRLA, EOMI, Conjunctiva are pink and non-injected EARS: External ears normal OROPHARYNX:no exudate, no erythema and lips, buccal mucosa, and tongue normal  NECK: no adenopathy LYMPH:  no palpable lymphadenopathy BREAST:breasts appear normal, no  suspicious masses, no skin or nipple changes or axillary nodes LUNGS: clear to auscultation and percussion HEART: regular rate & rhythm ABDOMEN:abdomen soft, normal bowel sounds and no masses or organomegaly BACK: Back symmetric, no curvature., No CVA tenderness EXTREMITIES:no edema, no clubbing, no cyanosis  NEURO: alert & oriented x 3 with fluent speech, no focal motor/sensory deficits, gait normal     STUDIES/RESULTS: Mr Breast Bilateral W Wo Contrast  09/01/2013   CLINICAL DATA:  Recent diagnosis of left breast cancer, following ultrasound-guided biopsy of a 0.5 x 0.9 cm mass in the left breast at 1 o'clock position 8 cm from the nipple. Additionally, a right breast mass at 5 o'clock position was biopsied, with benign pathology results.  EXAM: BILATERAL BREAST MRI WITH AND WITHOUT CONTRAST  LABS:  BUN and creatinine were obtained on site at [facility] at  [address]  Results:  BUN 13 mg/dL,  Creatinine 0.9 mg/dL.  TECHNIQUE: Multiplanar, multisequence MR images of both breasts were obtained prior to and following the intravenous administration of 107m of MultiHance.  THREE-DIMENSIONAL MR IMAGE RENDERING ON INDEPENDENT WORKSTATION:  Three-dimensional MR images were rendered by post-processing of the original MR data on an independent workstation. The three-dimensional MR images were interpreted, and findings are reported in the following complete MRI report for this study. Three dimensional images were evaluated at the independent DynaCad workstation  [facility]  [facility]  FINDINGS: Breast composition: b. Scattered fibroglandular tissue  Background parenchymal enhancement: Symmetric moderate nodular background parenchymal enhancement is present bilaterally. This enhancement pattern may limit the sensitivity for detecting malignancy.  Right breast: No mass or asymmetric enhancement is identified. Biopsy clip artifact is noted in the  lower inner quadrant of the right breast.  Left breast: Biopsy clip artifact is seen in the far lateral left breast, in the middle to posterior thirds. In the region of the biopsy clip, there is a 7 mm area of nodular enhancement with plateau kinetics. This enhancement in the region of the biopsy clip is very similar to the patient's diffuse background parenchymal enhancement pattern bilaterally.  Lymph nodes: No abnormal appearing lymph nodes.  Ancillary findings:  None.  IMPRESSION: Moderate nodular background parenchymal enhancement bilaterally. This may limit the sensitivity for detecting malignancy. The biopsy clip at the site of patient's biopsy proven left upper outer quadrant malignancy demonstrates an approximately 7 mm nodular area of enhancement that is very similar to the patient's diffuse background parenchymal enhancement pattern.  No MRI specific evidence of malignancy in the right breast.  RECOMMENDATION: Treatment planning for biopsy-proven malignancy in the left breast.  BI-RADS CATEGORY  6: Known  biopsy-proven malignancy - appropriate action should be taken.   Electronically Signed   By: Curlene Dolphin M.D.   On: 09/01/2013 09:40     LABS:    Chemistry      Component Value Date/Time   NA 144 09/01/2013 1221   K 4.2 09/01/2013 1221   CO2 30* 09/01/2013 1221   BUN 18.8 09/01/2013 1221   CREATININE 1.3* 09/01/2013 1221      Component Value Date/Time   CALCIUM 9.2 09/01/2013 1221   ALKPHOS 48 09/01/2013 1221   AST 17 09/01/2013 1221   ALT 16 09/01/2013 1221   BILITOT 0.78 09/01/2013 1221      Lab Results  Component Value Date   WBC 7.5 09/01/2013   HGB 15.6 09/01/2013   HCT 46.9* 09/01/2013   MCV 96.5 09/01/2013   PLT 143* 09/01/2013   ASSESSMENT    68 year old female with   1. stage I invasive and non invasive mammry carcinoma, ER+PR+ her2neu-, grade I. We discussed the pathophysiology of breast cancer today. We discussed the multidisciplinary approach to breast cancer and treatment  options.  2. Patient is a good candidate for breast conservation with lumpectomy and SLN.  3. She will also receive short course of RT  4. We discussed adjuvant systemic therapy with anti-estrogen therapy such as AI, we discussed the ratiional for adjuvant therapy  5. Patient may need to have oncotype testing. We will make that decision at the time of her final pathology.  Clinical Trial Eligibility: no Multidisciplinary conference discussion yes     PLAN:    1. Proceed with surgery  2. i will see the patient back after the surgery         Discussion: Patient is being treated per NCCN breast cancer care guidelines appropriate for stage.I   Thank you so much for allowing me to participate in the care of [name]. I will continue to follow up the patient with you and assist in her care.  All questions were answered. The patient knows to call the clinic with any problems, questions or concerns. We can certainly see the patient much sooner if necessary.  I spent 30 minutes counseling the patient face to face. The total time spent in the appointment was 55 minutes.  Marcy Panning, MD Medical/Oncology [facility] [phone] (beeper) [phone] (Office)  09/12/2013, 2:35 PM

## 2013-09-14 ENCOUNTER — Encounter: Payer: Self-pay | Admitting: *Deleted

## 2013-09-14 NOTE — Progress Notes (Signed)
Faxed Care Plan to [facility] and to PCP.  Took to Med Rec to scan.

## 2013-09-23 ENCOUNTER — Encounter (HOSPITAL_BASED_OUTPATIENT_CLINIC_OR_DEPARTMENT_OTHER): Payer: Self-pay | Admitting: *Deleted

## 2013-09-23 NOTE — Progress Notes (Signed)
Pt has many medical problems-and very nervous-had ekg with pcp-will call-had labs [facility] 09/01/13- Had sob syncopy 2007-echo done-ok No cardiologist Has a cpap-to bring it and all meds and overnight bag just in case she needs to stay-has not used her cpap in several months

## 2013-09-29 ENCOUNTER — Telehealth (INDEPENDENT_AMBULATORY_CARE_PROVIDER_SITE_OTHER): Payer: Self-pay

## 2013-09-29 ENCOUNTER — Ambulatory Visit (HOSPITAL_BASED_OUTPATIENT_CLINIC_OR_DEPARTMENT_OTHER)
Admission: RE | Admit: 2013-09-29 | Discharge: 2013-09-29 | Disposition: A | Discharge status: Home / Self Care | Payer: Medicare Other | Source: Ambulatory Visit | Attending: General Surgery | Admitting: General Surgery

## 2013-09-29 ENCOUNTER — Encounter (HOSPITAL_BASED_OUTPATIENT_CLINIC_OR_DEPARTMENT_OTHER): Payer: Self-pay | Admitting: *Deleted

## 2013-09-29 ENCOUNTER — Encounter (HOSPITAL_BASED_OUTPATIENT_CLINIC_OR_DEPARTMENT_OTHER): Payer: Medicare Other | Admitting: Anesthesiology

## 2013-09-29 ENCOUNTER — Ambulatory Visit (HOSPITAL_BASED_OUTPATIENT_CLINIC_OR_DEPARTMENT_OTHER): Payer: Medicare Other | Admitting: Anesthesiology

## 2013-09-29 ENCOUNTER — Encounter (HOSPITAL_BASED_OUTPATIENT_CLINIC_OR_DEPARTMENT_OTHER)
Admission: RE | Disposition: A | Discharge status: Home / Self Care | Payer: Self-pay | Source: Ambulatory Visit | Attending: General Surgery

## 2013-09-29 ENCOUNTER — Encounter (HOSPITAL_COMMUNITY)
Admission: RE | Admit: 2013-09-29 | Discharge: 2013-09-29 | Disposition: A | Discharge status: Home / Self Care | Payer: Medicare Other | Source: Ambulatory Visit | Attending: General Surgery | Admitting: General Surgery

## 2013-09-29 DIAGNOSIS — D0590 Unspecified type of carcinoma in situ of unspecified breast: Secondary | ICD-10-CM

## 2013-09-29 DIAGNOSIS — G4730 Sleep apnea, unspecified: Secondary | ICD-10-CM | POA: Insufficient documentation

## 2013-09-29 DIAGNOSIS — G40802 Other epilepsy, not intractable, without status epilepticus: Secondary | ICD-10-CM | POA: Insufficient documentation

## 2013-09-29 DIAGNOSIS — I10 Essential (primary) hypertension: Secondary | ICD-10-CM | POA: Insufficient documentation

## 2013-09-29 DIAGNOSIS — C50912 Malignant neoplasm of unspecified site of left female breast: Secondary | ICD-10-CM

## 2013-09-29 DIAGNOSIS — K219 Gastro-esophageal reflux disease without esophagitis: Secondary | ICD-10-CM | POA: Insufficient documentation

## 2013-09-29 DIAGNOSIS — F3289 Other specified depressive episodes: Secondary | ICD-10-CM | POA: Insufficient documentation

## 2013-09-29 DIAGNOSIS — C50519 Malignant neoplasm of lower-outer quadrant of unspecified female breast: Secondary | ICD-10-CM | POA: Insufficient documentation

## 2013-09-29 DIAGNOSIS — Z8673 Personal history of transient ischemic attack (TIA), and cerebral infarction without residual deficits: Secondary | ICD-10-CM | POA: Insufficient documentation

## 2013-09-29 DIAGNOSIS — R32 Unspecified urinary incontinence: Secondary | ICD-10-CM | POA: Insufficient documentation

## 2013-09-29 DIAGNOSIS — F329 Major depressive disorder, single episode, unspecified: Secondary | ICD-10-CM | POA: Insufficient documentation

## 2013-09-29 DIAGNOSIS — Z801 Family history of malignant neoplasm of trachea, bronchus and lung: Secondary | ICD-10-CM | POA: Insufficient documentation

## 2013-09-29 DIAGNOSIS — Z96649 Presence of unspecified artificial hip joint: Secondary | ICD-10-CM | POA: Insufficient documentation

## 2013-09-29 HISTORY — DX: Shortness of breath: R06.02

## 2013-09-29 HISTORY — DX: Complete loss of teeth, unspecified cause, unspecified class: Z97.2

## 2013-09-29 HISTORY — DX: Adverse effect of unspecified anesthetic, initial encounter: T41.45XA

## 2013-09-29 HISTORY — DX: Other complications of anesthesia, initial encounter: T88.59XA

## 2013-09-29 HISTORY — DX: Noninfective gastroenteritis and colitis, unspecified: K52.9

## 2013-09-29 HISTORY — DX: Anxiety disorder, unspecified: F41.9

## 2013-09-29 HISTORY — DX: Unspecified osteoarthritis, unspecified site: M19.90

## 2013-09-29 HISTORY — PX: BREAST LUMPECTOMY WITH NEEDLE LOCALIZATION AND AXILLARY SENTINEL LYMPH NODE BX: SHX5760

## 2013-09-29 HISTORY — DX: Unspecified hearing loss, unspecified ear: H91.90

## 2013-09-29 HISTORY — DX: Gastro-esophageal reflux disease without esophagitis: K21.9

## 2013-09-29 HISTORY — DX: Complete loss of teeth, unspecified cause, unspecified class: K08.109

## 2013-09-29 HISTORY — DX: Cerebral infarction, unspecified: I63.9

## 2013-09-29 HISTORY — DX: Sleep apnea, unspecified: G47.30

## 2013-09-29 SURGERY — BREAST LUMPECTOMY WITH NEEDLE LOCALIZATION AND AXILLARY SENTINEL LYMPH NODE BX
Anesthesia: General | Site: Breast | Laterality: Left

## 2013-09-29 MED ORDER — GLYCOPYRROLATE 0.2 MG/ML IJ SOLN
INTRAMUSCULAR | Status: DC | PRN
  Administered 2013-09-29: 0.2 mg via INTRAVENOUS

## 2013-09-29 MED ORDER — PROMETHAZINE HCL 25 MG/ML IJ SOLN: 6.2500 mg | INTRAMUSCULAR | Status: DC | PRN

## 2013-09-29 MED ORDER — MIDAZOLAM HCL 2 MG/2ML IJ SOLN
1.0000 mg | INTRAMUSCULAR | Status: DC | PRN
Start: 2013-09-29 — End: 2013-09-29

## 2013-09-29 MED ORDER — CEFAZOLIN SODIUM 1-5 GM-% IV SOLN
INTRAVENOUS | Status: AC
  Filled 2013-09-29: qty 100

## 2013-09-29 MED ORDER — MIDAZOLAM HCL 2 MG/2ML IJ SOLN
INTRAMUSCULAR | Status: AC
  Filled 2013-09-29: qty 2

## 2013-09-29 MED ORDER — OXYCODONE HCL 5 MG PO TABS
5.0000 mg | ORAL_TABLET | Freq: Once | ORAL | Status: AC | PRN
  Administered 2013-09-29: 5 mg via ORAL
  Filled 2013-09-29: qty 1

## 2013-09-29 MED ORDER — BUPIVACAINE-EPINEPHRINE PF 0.25-1:200000 % IJ SOLN
INTRAMUSCULAR | Status: AC
  Filled 2013-09-29: qty 30

## 2013-09-29 MED ORDER — TECHNETIUM TC 99M SULFUR COLLOID FILTERED
1.0000 | Freq: Once | INTRAVENOUS | Status: AC | PRN
  Administered 2013-09-29: 1 via INTRADERMAL

## 2013-09-29 MED ORDER — SUCCINYLCHOLINE CHLORIDE 20 MG/ML IJ SOLN
INTRAMUSCULAR | Status: DC | PRN
  Administered 2013-09-29: 100 mg via INTRAVENOUS

## 2013-09-29 MED ORDER — ONDANSETRON HCL 4 MG/2ML IJ SOLN
INTRAMUSCULAR | Status: DC | PRN
  Administered 2013-09-29: 4 mg via INTRAVENOUS

## 2013-09-29 MED ORDER — LACTATED RINGERS IV SOLN
INTRAVENOUS | Status: DC
  Administered 2013-09-29 (×2): via INTRAVENOUS
  Administered 2013-09-29: 20 mL/h via INTRAVENOUS

## 2013-09-29 MED ORDER — SODIUM CHLORIDE 0.9 % IJ SOLN
INTRAMUSCULAR | Status: AC
  Filled 2013-09-29: qty 10

## 2013-09-29 MED ORDER — FENTANYL CITRATE 0.05 MG/ML IJ SOLN
INTRAMUSCULAR | Status: AC
  Filled 2013-09-29: qty 2

## 2013-09-29 MED ORDER — CEFAZOLIN SODIUM-DEXTROSE 2-3 GM-% IV SOLR
2.0000 g | INTRAVENOUS | Status: AC
  Administered 2013-09-29: 2 g via INTRAVENOUS

## 2013-09-29 MED ORDER — HYDROMORPHONE HCL PF 1 MG/ML IJ SOLN
INTRAMUSCULAR | Status: AC
  Filled 2013-09-29: qty 1

## 2013-09-29 MED ORDER — BUPIVACAINE-EPINEPHRINE 0.25-1:200000 % IJ SOLN
INTRAMUSCULAR | Status: DC | PRN
  Administered 2013-09-29: 10 mL via INTRAPLEURAL

## 2013-09-29 MED ORDER — CHLORHEXIDINE GLUCONATE 4 % EX LIQD: 1.0000 "application " | Freq: Once | CUTANEOUS | Status: DC

## 2013-09-29 MED ORDER — OXYCODONE HCL 5 MG/5ML PO SOLN: 5.0000 mg | Freq: Once | ORAL | Status: AC | PRN

## 2013-09-29 MED ORDER — HYDROCODONE-ACETAMINOPHEN 5-325 MG PO TABS: 1.0000 | ORAL_TABLET | ORAL | Status: DC | PRN

## 2013-09-29 MED ORDER — BUPIVACAINE HCL (PF) 0.25 % IJ SOLN
INTRAMUSCULAR | Status: AC
  Filled 2013-09-29: qty 30

## 2013-09-29 MED ORDER — FENTANYL CITRATE 0.05 MG/ML IJ SOLN
50.0000 ug | INTRAMUSCULAR | Status: DC | PRN
  Administered 2013-09-29: 50 ug via INTRAVENOUS

## 2013-09-29 MED ORDER — FENTANYL CITRATE 0.05 MG/ML IJ SOLN
INTRAMUSCULAR | Status: AC
  Filled 2013-09-29: qty 4

## 2013-09-29 MED ORDER — PROPOFOL 10 MG/ML IV BOLUS
INTRAVENOUS | Status: AC
  Filled 2013-09-29: qty 20

## 2013-09-29 MED ORDER — METHYLENE BLUE 1 % INJ SOLN
INTRAMUSCULAR | Status: DC | PRN
  Administered 2013-09-29: 2 mL via SUBMUCOSAL

## 2013-09-29 MED ORDER — DEXAMETHASONE SODIUM PHOSPHATE 4 MG/ML IJ SOLN
INTRAMUSCULAR | Status: DC | PRN
  Administered 2013-09-29: 10 mg via INTRAVENOUS

## 2013-09-29 MED ORDER — FENTANYL CITRATE 0.05 MG/ML IJ SOLN
INTRAMUSCULAR | Status: DC | PRN
  Administered 2013-09-29: 50 ug via INTRAVENOUS

## 2013-09-29 MED ORDER — SODIUM CHLORIDE 0.9 % IJ SOLN
INTRAMUSCULAR | Status: DC | PRN
  Administered 2013-09-29: 3 mL via INTRAVENOUS

## 2013-09-29 MED ORDER — METHYLENE BLUE 1 % INJ SOLN
INTRAMUSCULAR | Status: AC
  Filled 2013-09-29: qty 10

## 2013-09-29 MED ORDER — FENTANYL CITRATE 0.05 MG/ML IJ SOLN
25.0000 ug | INTRAMUSCULAR | Status: DC | PRN
  Administered 2013-09-29 (×3): 25 ug via INTRAVENOUS

## 2013-09-29 MED ORDER — LIDOCAINE HCL (CARDIAC) 20 MG/ML IV SOLN
INTRAVENOUS | Status: DC | PRN
Start: 2013-09-29 — End: 2013-09-29
  Administered 2013-09-29: 50 mg via INTRAVENOUS

## 2013-09-29 MED ORDER — PROPOFOL 10 MG/ML IV BOLUS
INTRAVENOUS | Status: DC | PRN
  Administered 2013-09-29: 100 mg via INTRAVENOUS

## 2013-09-29 SURGICAL SUPPLY — 64 items
BINDER BREAST LRG (GAUZE/BANDAGES/DRESSINGS) IMPLANT
BINDER BREAST MEDIUM (GAUZE/BANDAGES/DRESSINGS) IMPLANT
BINDER BREAST XLRG (GAUZE/BANDAGES/DRESSINGS) IMPLANT
BINDER BREAST XXLRG (GAUZE/BANDAGES/DRESSINGS) ×2 IMPLANT
BLADE HEX COATED 2.75 (ELECTRODE) ×3 IMPLANT
BLADE SURG 10 STRL SS (BLADE) ×3 IMPLANT
BLADE SURG 15 STRL LF DISP TIS (BLADE) ×1 IMPLANT
BLADE SURG 15 STRL SS (BLADE) ×3
BLADE SURG ROTATE 9660 (MISCELLANEOUS) ×2 IMPLANT
BNDG COHESIVE 4X5 TAN STRL (GAUZE/BANDAGES/DRESSINGS) ×3 IMPLANT
CANISTER SUCT 1200ML W/VALVE (MISCELLANEOUS) ×3 IMPLANT
CHLORAPREP W/TINT 26ML (MISCELLANEOUS) ×3 IMPLANT
CLIP TI LARGE 6 (CLIP) ×3 IMPLANT
CLIP TI MEDIUM 6 (CLIP) ×3 IMPLANT
CLIP TI WIDE RED SMALL 6 (CLIP) ×6 IMPLANT
CLOSURE WOUND 1/2 X4 (GAUZE/BANDAGES/DRESSINGS) ×1
COVER MAYO STAND STRL (DRAPES) ×3 IMPLANT
COVER PROBE W GEL 5X96 (DRAPES) ×3 IMPLANT
DECANTER SPIKE VIAL GLASS SM (MISCELLANEOUS) IMPLANT
DEVICE DUBIN W/COMP PLATE 8390 (MISCELLANEOUS) ×2 IMPLANT
DRAIN CHANNEL 19F RND (DRAIN) IMPLANT
DRAPE UTILITY XL STRL (DRAPES) ×5 IMPLANT
DRSG TEGADERM 4X4.75 (GAUZE/BANDAGES/DRESSINGS) IMPLANT
ELECT REM PT RETURN 9FT ADLT (ELECTROSURGICAL) ×3
ELECTRODE REM PT RTRN 9FT ADLT (ELECTROSURGICAL) ×1 IMPLANT
EVACUATOR SILICONE 100CC (DRAIN) IMPLANT
GLOVE BIO SURGEON STRL SZ 6 (GLOVE) ×3 IMPLANT
GLOVE BIOGEL PI IND STRL 6.5 (GLOVE) ×1 IMPLANT
GLOVE BIOGEL PI INDICATOR 6.5 (GLOVE) ×4
GLOVE ECLIPSE 6.5 STRL STRAW (GLOVE) ×4 IMPLANT
GOWN STRL REUS W/ TWL LRG LVL3 (GOWN DISPOSABLE) ×1 IMPLANT
GOWN STRL REUS W/TWL 2XL LVL3 (GOWN DISPOSABLE) ×3 IMPLANT
GOWN STRL REUS W/TWL LRG LVL3 (GOWN DISPOSABLE) ×3
KIT MARKER MARGIN INK (KITS) ×3 IMPLANT
NDL HYPO 25X1 1.5 SAFETY (NEEDLE) ×2 IMPLANT
NDL SAFETY ECLIPSE 18X1.5 (NEEDLE) ×1 IMPLANT
NEEDLE HYPO 18GX1.5 SHARP (NEEDLE) ×3
NEEDLE HYPO 25X1 1.5 SAFETY (NEEDLE) ×6 IMPLANT
NS IRRIG 1000ML POUR BTL (IV SOLUTION) ×2 IMPLANT
PACK BASIN DAY SURGERY FS (CUSTOM PROCEDURE TRAY) ×3 IMPLANT
PACK UNIVERSAL I (CUSTOM PROCEDURE TRAY) ×3 IMPLANT
PAD ABD 8X10 STRL (GAUZE/BANDAGES/DRESSINGS) IMPLANT
PENCIL BUTTON HOLSTER BLD 10FT (ELECTRODE) ×3 IMPLANT
PIN SAFETY STERILE (MISCELLANEOUS) IMPLANT
SLEEVE SCD COMPRESS KNEE MED (MISCELLANEOUS) IMPLANT
SPONGE GAUZE 4X4 12PLY (GAUZE/BANDAGES/DRESSINGS) IMPLANT
SPONGE LAP 18X18 X RAY DECT (DISPOSABLE) ×3 IMPLANT
SPONGE LAP 4X18 X RAY DECT (DISPOSABLE) IMPLANT
STAPLER VISISTAT 35W (STAPLE) ×3 IMPLANT
STOCKINETTE IMPERVIOUS LG (DRAPES) ×3 IMPLANT
STRIP CLOSURE SKIN 1/2X4 (GAUZE/BANDAGES/DRESSINGS) ×2 IMPLANT
SUT MON AB 4-0 PC3 18 (SUTURE) ×3 IMPLANT
SUT SILK 2 0 SH (SUTURE) IMPLANT
SUT VIC AB 2-0 SH 27 (SUTURE)
SUT VIC AB 2-0 SH 27XBRD (SUTURE) IMPLANT
SUT VIC AB 3-0 SH 27 (SUTURE) ×6
SUT VIC AB 3-0 SH 27X BRD (SUTURE) ×2 IMPLANT
SYR BULB 3OZ (MISCELLANEOUS) IMPLANT
SYR CONTROL 10ML LL (SYRINGE) ×6 IMPLANT
TOWEL OR 17X24 6PK STRL BLUE (TOWEL DISPOSABLE) ×3 IMPLANT
TOWEL OR NON WOVEN STRL DISP B (DISPOSABLE) ×3 IMPLANT
TUBE CONNECTING 20'X1/4 (TUBING) ×1
TUBE CONNECTING 20X1/4 (TUBING) ×2 IMPLANT
YANKAUER SUCT BULB TIP NO VENT (SUCTIONS) ×3 IMPLANT

## 2013-09-29 NOTE — Progress Notes (Signed)
nuc med injection performed by radiology staff. Tolerated well. VSS (see doc flowsheets)

## 2013-09-29 NOTE — Op Note (Addendum)
Left Breast needle localized Lumpectomy with Sentinel Node Mapping and Biopsy Procedure Note  Indications: This patient presents with history of left breast cancer with clinically negative axillary lymph node exam.  Pre-operative Diagnosis: left breast cancer  Post-operative Diagnosis: left breast cancer  Surgeon: [name]   Assistants: n/a  Anesthesia: General endotracheal anesthesia and Local anesthesia 0.5% bupivacaine, with epinephrine  ASA Class: 3  Procedure Details  The patient was seen in the Holding Room. The risks, benefits, complications, treatment options, and expected outcomes were discussed with the patient. The possibilities of reaction to medication, pulmonary aspiration, bleeding, infection, the need for additional procedures, failure to diagnose a condition, and creating a complication requiring transfusion or operation were discussed with the patient. The patient concurred with the proposed plan, giving informed consent.  The site of surgery properly noted/marked. The patient was taken to Operating Room # 5, identified as ISRAA CABAN and the procedure verified as Breast Lumpectomy and Sentinel Node Biopsy. A Time Out was held and the above information confirmed.  The methylene blue was injected in the subareolar location after cleaning skin with CHG swab.  After induction of anesthesia, the left arm, breast, and chest were prepped and draped in standard fashion. The lumpectomy was performed by creating an oblique incision over the lower outer quadrant of the breast around the previously placed localization guidewire.  Dissection was carried down around the wire.  The specimen margins were inked. Specimen radiography confirmed inclusion of the mammographic lesion.  Hemostasis was achieved with cautery.  The wound was irrigated and closed with a 3-0 Vicryl deep dermal interrupted and a 4-0 Monocryl subcuticular closure in layers.   Using a hand-held gamma probe, axillary  sentinel nodes were identified transcutaneously.  An oblique incision was created below the axillary hairline.  Dissection was carried through the clavipectoral fascia.  2 level 2 axillary sentinel nodes were removed and submitted to pathology. The axillary incision was closed with 3-0 vicryl deep dermal interrupted sutures and a 4-0 monocryl subcuticular closure in layers.       Sterile dressings were applied. At the end of the operation, all sponge, instrument, and needle counts were correct.  Findings: grossly clear surgical margins and 2 sentinel lymph nodes.  #1 hot cps 370; #2 hot and blue cps 750  Estimated Blood Loss:  Minimal         Drains: none         Specimens: left breast lumpectomy and left axillary SLN #1, SLN #2                Complications:  None; patient tolerated the procedure well.         Disposition: PACU - hemodynamically stable.         Condition: stable

## 2013-09-29 NOTE — Anesthesia Preprocedure Evaluation (Addendum)
Anesthesia Evaluation  Patient identified by MRN, date of birth, ID band Patient awake    Reviewed: Allergy & Precautions, H&P , NPO status   History of Anesthesia Complications (+) PROLONGED EMERGENCE  Airway Mallampati: II  Neck ROM: Full    Dental  (+) Edentulous Upper   Pulmonary shortness of breath, sleep apnea ,  breath sounds clear to auscultation        Cardiovascular hypertension, Rhythm:Regular Rate:Normal     Neuro/Psych CVA    GI/Hepatic GERD-  ,  Endo/Other  Morbid obesity  Renal/GU      Musculoskeletal   Abdominal (+) + obese,   Peds  Hematology   Anesthesia Other Findings   Reproductive/Obstetrics                          Anesthesia Physical Anesthesia Plan  ASA: III  Anesthesia Plan: General   Post-op Pain Management:    Induction: Intravenous  Airway Management Planned: Oral ETT  Additional Equipment:   Intra-op Plan:   Post-operative Plan: Extubation in OR  Informed Consent: I have reviewed the patients History and Physical, chart, labs and discussed the procedure including the risks, benefits and alternatives for the proposed anesthesia with the patient or authorized representative who has indicated his/her understanding and acceptance.   Dental advisory given  Plan Discussed with: CRNA and Surgeon  Anesthesia Plan Comments:         Anesthesia Quick Evaluation

## 2013-09-29 NOTE — Discharge Instructions (Addendum)
[facility] °Office Phone Number [phone] ° °BREAST BIOPSY/ PARTIAL MASTECTOMY: POST OP INSTRUCTIONS ° °Always review your discharge instruction sheet given to you by the facility where your surgery was performed. ° °IF YOU HAVE DISABILITY OR FAMILY LEAVE FORMS, YOU MUST BRING THEM TO THE OFFICE FOR PROCESSING.  DO NOT GIVE THEM TO YOUR DOCTOR. ° °1. A prescription for pain medication may be given to you upon discharge.  Take your pain medication as prescribed, if needed.  If narcotic pain medicine is not needed, then you may take acetaminophen (Tylenol) or ibuprofen (Advil) as needed. °2. Take your usually prescribed medications unless otherwise directed °3. If you need a refill on your pain medication, please contact your pharmacy.  They will contact our office to request authorization.  Prescriptions will not be filled after 5pm or on week-ends. °4. You should eat very light the first 24 hours after surgery, such as soup, crackers, pudding, etc.  Resume your normal diet the day after surgery. °5. Most patients will experience some swelling and bruising in the breast.  Ice packs and a good support bra will help.  Swelling and bruising can take several days to resolve.  °6. It is common to experience some constipation if taking pain medication after surgery.  Increasing fluid intake and taking a stool softener will usually help or prevent this problem from occurring.  A mild laxative (Milk of Magnesia or Miralax) should be taken according to package directions if there are no bowel movements after 48 hours. °7. Unless discharge instructions indicate otherwise, you may remove your bandages 48 hours after surgery, and you may shower at that time.  You may have steri-strips (small skin tapes) in place directly over the incision.  These strips should be left on the skin for 7-10 days.   Any sutures or staples will be removed at the office during your follow-up visit. °8. ACTIVITIES:  You may resume  regular daily activities (gradually increasing) beginning the next day.  Wearing a good support bra or sports bra (or the breast binder) minimizes pain and swelling.  You may have sexual intercourse when it is comfortable. °a. You may drive when you no longer are taking prescription pain medication, you can comfortably wear a seatbelt, and you can safely maneuver your car and apply brakes. °b. RETURN TO WORK:  __________1 week_______________ °9. You should see your doctor in the office for a follow-up appointment approximately two weeks after your surgery.  Your doctor’s nurse will typically make your follow-up appointment when she calls you with your pathology report.  Expect your pathology report 2-3 business days after your surgery.  You may call to check if you do not hear from us after three days. ° ° °WHEN TO CALL YOUR DOCTOR: °1. Fever over 101.0 °2. Nausea and/or vomiting. °3. Extreme swelling or bruising. °4. Continued bleeding from incision. °5. Increased pain, redness, or drainage from the incision. ° °The clinic staff is available to answer your questions during regular business hours.  Please don’t hesitate to call and ask to speak to one of the nurses for clinical concerns.  If you have a medical emergency, go to the nearest emergency room or call 911.  A surgeon from [facility] is always on call at the hospital. ° °For further questions, please visit [email or web address]  ° ° °Post Anesthesia Home Care Instructions ° °Activity: °Get plenty of rest for the remainder of the day. A responsible adult should stay with you for 24   hours following the procedure.  For the next 24 hours, DO NOT: -Drive a car -Paediatric nurse -Drink alcoholic beverages -Take any medication unless instructed by your physician -Make any legal decisions or sign important papers.  Meals: Start with liquid foods such as gelatin or soup. Progress to regular foods as tolerated. Avoid greasy, spicy, heavy  foods. If nausea and/or vomiting occur, drink only clear liquids until the nausea and/or vomiting subsides. Call your physician if vomiting continues.  Special Instructions/Symptoms: Your throat may feel dry or sore from the anesthesia or the breathing tube placed in your throat during surgery. If this causes discomfort, gargle with warm salt water. The discomfort should disappear within 24 hours.  Post Anesthesia Home Care Instructions  Activity: Get plenty of rest for the remainder of the day. A responsible adult should stay with you for 24 hours following the procedure.  For the next 24 hours, DO NOT: -Drive a car -Paediatric nurse -Drink alcoholic beverages -Take any medication unless instructed by your physician -Make any legal decisions or sign important papers.  Meals: Start with liquid foods such as gelatin or soup. Progress to regular foods as tolerated. Avoid greasy, spicy, heavy foods. If nausea and/or vomiting occur, drink only clear liquids until the nausea and/or vomiting subsides. Call your physician if vomiting continues.  Special Instructions/Symptoms: Your throat may feel dry or sore from the anesthesia or the breathing tube placed in your throat during surgery. If this causes discomfort, gargle with warm salt water. The discomfort should disappear within 24 hours.

## 2013-09-29 NOTE — Anesthesia Postprocedure Evaluation (Signed)
  Anesthesia Post-op Note  Patient: [name]  Procedure(s) Performed: Procedure(s) with comments: BREAST LUMPECTOMY WITH NEEDLE LOCALIZATION AND AXILLARY SENTINEL LYMPH NODE BX (Left) - clean wound class  Patient Location: PACU  Anesthesia Type:General  Level of Consciousness: awake and alert   Airway and Oxygen Therapy: Patient Spontanous Breathing  Post-op Pain: mild  Post-op Assessment: Post-op Vital signs reviewed  Post-op Vital Signs: stable  Complications: No apparent anesthesia complications

## 2013-09-29 NOTE — Interval H&P Note (Signed)
History and Physical Interval Note:  09/29/2013 9:30 AM  [name]  has presented today for surgery, with the diagnosis of LEFT BREAST CANCER   The various methods of treatment have been discussed with the patient and family. After consideration of risks, benefits and other options for treatment, the patient has consented to  Procedure(s): BREAST LUMPECTOMY WITH NEEDLE LOCALIZATION AND AXILLARY SENTINEL LYMPH NODE BX (Left) as a surgical intervention .  The patient's history has been reviewed, patient examined, no change in status, stable for surgery.  I have reviewed the patient's chart and labs.  Questions were answered to the patient's satisfaction.     Shaquetta Arcos

## 2013-09-29 NOTE — H&P (View-Only) (Signed)
Chief complaint:  Left breast cancer.    HISTORY: Patient is a 68 year old female with a new diagnosis of left breast cancer. She had a screening detected asymmetry noted and some benign changes on the right. She has no family history of breast cancer.  She has not noted any palpable masses or skin dimpling. She has not had prior breast biopsies that she is aware of. She does have some issues with her nerves in her bladder and her spine. She complains of incontinence. She does have a family history of lung cancer in a brother diagnosed in his 68s. She has not ever been a smoker and is not alcohol. She had menarche at age 71. She had a hysterectomy and is not having periods. She did use hormonal contraception for 40 years does not use any hormone replacement. She carried to term to term with her first age at live birth at age 61. She is up-to-date with colonoscopy and a bone density study.  Past Medical History  Diagnosis Date  . Tremor   . Bowel incontinence   . Bladder incontinence   . High blood pressure   . Anemia   . Depression   . Seizures     Past Surgical History  Procedure Laterality Date  . Back surgery    . Total hip arthroplasty    . Abdominal hysterectomy    . Ovarian cysts      Current Outpatient Prescriptions  Medication Sig Dispense Refill  . amLODipine (NORVASC) 5 MG tablet Take 5 mg by mouth daily.       Marland Kitchen atenolol (TENORMIN) 50 MG tablet Take 50 mg by mouth daily.       . citalopram (CELEXA) 40 MG tablet Take 40 mg by mouth daily.       . clonazePAM (KLONOPIN) 1 MG tablet Take by mouth 3 (three) times daily as needed.       Marland Kitchen levothyroxine (SYNTHROID, LEVOTHROID) 50 MCG tablet Take 50 mcg by mouth daily before breakfast.       . nystatin cream (MYCOSTATIN) Apply 1 application topically as needed.       . ranitidine (ZANTAC) 150 MG tablet Take 150 mg by mouth 2 (two) times daily.       . simvastatin (ZOCOR) 40 MG tablet Take 40 mg by mouth.        No current  facility-administered medications for this visit.     Allergies  Allergen Reactions  . Codeine      Family History  Problem Relation Age of Onset  . Ataxia Neg Hx   . Chorea Neg Hx   . Dementia Neg Hx   . Mental retardation Neg Hx   . Migraines Neg Hx   . Multiple sclerosis Neg Hx   . Neurofibromatosis Neg Hx   . Neuropathy Neg Hx   . Parkinsonism Neg Hx   . Seizures Neg Hx   . Stroke Neg Hx   . Lung cancer Brother      History   Social History  . Marital Status: Widowed    Spouse Name: N/A    Number of Children: N/A  . Years of Education: N/A   Social History Main Topics  . Smoking status: Never Smoker   . Smokeless tobacco: Never Used  . Alcohol Use: No  . Drug Use: No  . Sexual Activity: Not on file   Other Topics Concern  . Not on file   Social History Narrative  . No narrative on  file     REVIEW OF SYSTEMS - PERTINENT POSITIVES ONLY: 12 point review of systems negative other than HPI and PMH except for fatigue, pain, easy bruising, seizures, headaches, forgetfulness, anxiety,  depression, and incontinence  EXAM: Wt Readings from Last 3 Encounters:  09/01/13 247 lb (112.038 kg)  08/13/13 246 lb (111.585 kg)   Temp Readings from Last 3 Encounters:  09/01/13 97.6 F (36.4 C) Oral  08/13/13 98.1 F (36.7 C)    BP Readings from Last 3 Encounters:  09/01/13 138/73  08/13/13 150/80   Pulse Readings from Last 3 Encounters:  09/01/13 54  08/13/13 80     Gen:  No acute distress.  Well nourished and well groomed.   Neurological: Alert and oriented to person, place, and time. Coordination normal.  Head: Normocephalic and atraumatic.  Eyes: Conjunctivae are normal. Pupils are equal, round, and reactive to light. No scleral icterus.  Neck: Normal range of motion. Neck supple. No tracheal deviation or thyromegaly present.  Cardiovascular: Normal rate, regular rhythm, normal heart sounds and intact distal pulses.  Exam reveals no gallop and no  friction rub.  No murmur heard. Respiratory: Effort normal.  No respiratory distress. No chest wall tenderness. Breath sounds normal.  No wheezes, rales or rhonchi.  Breast:  No palpable masses, skin dimpling, nipple retraction, nipple discharge, lymphadenopathy appreciable.  There is some bruising on the right at one of the sites of an ultrasound-guided biopsy of a benign lesion. GI: Soft. Bowel sounds are normal. The abdomen is soft and nontender.  There is no rebound and no guarding.  Musculoskeletal: Normal range of motion. Extremities are nontender.  Lymphadenopathy: No cervical, preauricular, postauricular or axillary adenopathy is present Skin: Skin is warm and dry. No rash noted. No diaphoresis. No erythema. No pallor. No clubbing, cyanosis, or edema.   Psychiatric: Normal mood and affect. Behavior is normal. Judgment and thought content normal.    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    RADIOLOGY RESULTS: See E-Chart or I-Site for most recent results.  Images and reports are reviewed. MRI IMPRESSION:  Moderate nodular background parenchymal enhancement bilaterally.  This may limit the sensitivity for detecting malignancy. The biopsy  clip at the site of patient's biopsy proven left upper outer  quadrant malignancy demonstrates an approximately 7 mm nodular area  of enhancement that is very similar to the patient's diffuse  background parenchymal enhancement pattern.  No MRI specific evidence of malignancy in the right breast.   Mammogram/ultrasound 9 mm mass at 1 o'clock seen.  suggenstive of malignancy.  Other areas seen, biopsied, benign.    ASSESSMENT AND PLAN: Breast cancer of upper-outer quadrant of left female breast Patient has a new diagnosis of left breast cancer. This appears to be a T1 N0 cancer. We will plan breast conservation  on her.  She'll need a needle localized lumpectomy with sentinel lymph node biopsy on the left. She has discussed radiation with Dr. Valere Dross.  The surgical procedure was described to the patient.  I discussed the incision type and location and that we would need radiology involved on the day of surgery with a wire marker and sentinel node injection.   We may be able to get breast specimen and lymph node from same incision depending on wire placement.       The risks and benefits of the procedure were  described to the patient and he/she wishes to proceed.    We discussed the risks bleeding, infection, damage to other structures, need for further procedures/surgeries.  We discussed the risk of seroma.  The patient was advised if the area in the breast in cancer, we may need to go back to surgery for additional tissue to obtain negative margins or for a lymph node biopsy. The patient was advised that these are the most common complications, but that others can occur as well.  They were advised against taking aspirin or other anti-inflammatory agents/blood thinners the week before surgery.    45 min spent in evaluation, examination, counseling, and coordination of care.  >50% spent with counseling.        Milus Height MD Surgical Oncology, General and [facility]      Visit Diagnoses: 1. Breast cancer, left   2. Breast cancer of upper-outer quadrant of left female breast     Primary Care Physician: Alvester Chou, NP  [facility] Panning medical oncology  Arloa Koh radiation oncology  Bary Castilla RN breast navigator.

## 2013-09-29 NOTE — Telephone Encounter (Signed)
[facility] pt has appt with Dr. Barry Dienes 10/18/13 at 2:45 p.m.

## 2013-09-29 NOTE — Transfer of Care (Signed)
Immediate Anesthesia Transfer of Care Note  Patient: [name]  Procedure(s) Performed: Procedure(s) with comments: BREAST LUMPECTOMY WITH NEEDLE LOCALIZATION AND AXILLARY SENTINEL LYMPH NODE BX (Left) - clean wound class  Patient Location: PACU  Anesthesia Type:General  Level of Consciousness: sedated  Airway & Oxygen Therapy: Patient Spontanous Breathing and Patient connected to face mask oxygen  Post-op Assessment: Report given to PACU RN and Post -op Vital signs reviewed and stable  Post vital signs: Reviewed and stable  Complications: No apparent anesthesia complications

## 2013-09-29 NOTE — Anesthesia Procedure Notes (Signed)
Procedure Name: Intubation Date/Time: 09/29/2013 10:16 AM Performed by: Lyndee Leo Pre-anesthesia Checklist: Patient identified, Emergency Drugs available, Suction available and Patient being monitored Patient Re-evaluated:Patient Re-evaluated prior to inductionOxygen Delivery Method: Circle System Utilized Preoxygenation: Pre-oxygenation with 100% oxygen Intubation Type: IV induction and Rapid sequence Ventilation: Mask ventilation without difficulty Laryngoscope Size: Mac and 3 Grade View: Grade II Tube type: Oral Tube size: 7.0 mm Number of attempts: 1 Airway Equipment and Method: stylet and oral airway Placement Confirmation: ETT inserted through vocal cords under direct vision,  positive ETCO2 and breath sounds checked- equal and bilateral Secured at: 20 cm Tube secured with: Tape Dental Injury: Teeth and Oropharynx as per pre-operative assessment

## 2013-09-30 ENCOUNTER — Encounter (HOSPITAL_BASED_OUTPATIENT_CLINIC_OR_DEPARTMENT_OTHER): Payer: Self-pay | Admitting: General Surgery

## 2013-10-04 ENCOUNTER — Telehealth (INDEPENDENT_AMBULATORY_CARE_PROVIDER_SITE_OTHER): Payer: Self-pay | Admitting: General Surgery

## 2013-10-04 ENCOUNTER — Telehealth (INDEPENDENT_AMBULATORY_CARE_PROVIDER_SITE_OTHER): Payer: Self-pay

## 2013-10-04 NOTE — Telephone Encounter (Signed)
Pt called to request refill of Norco.  She is taking only one at-a-time now, only occasionally two.  Her bowels are moving well.  Pt has also reported a "rash all over her chest" that is very itchy.  She suspects she was allergic to the cleansing agent, as it was limited to her chest.  Please call her with response to request for meds.

## 2013-10-04 NOTE — Telephone Encounter (Signed)
Pt's Rx refill for Norco 5/325 #30 at front desk for p/u by pt's daughter.

## 2013-10-05 ENCOUNTER — Ambulatory Visit: Payer: Medicare Other | Admitting: Physical Therapy

## 2013-10-05 ENCOUNTER — Telehealth (INDEPENDENT_AMBULATORY_CARE_PROVIDER_SITE_OTHER): Payer: Self-pay | Admitting: General Surgery

## 2013-10-05 NOTE — Telephone Encounter (Signed)
Close encounter 

## 2013-10-05 NOTE — Telephone Encounter (Signed)
Left message that LN and margins are negative.  Discussed that we will ultimately review with pathology to make sure that one margin that was close does not need to be reexcised.

## 2013-10-06 ENCOUNTER — Telehealth (INDEPENDENT_AMBULATORY_CARE_PROVIDER_SITE_OTHER): Payer: Self-pay | Admitting: General Surgery

## 2013-10-06 MED ORDER — TRAMADOL HCL 50 MG PO TABS: 50.0000 mg | ORAL_TABLET | Freq: Four times a day (QID) | ORAL | Status: DC | PRN

## 2013-10-06 NOTE — Telephone Encounter (Signed)
Pt did not fully understand VM left by Dr. Barry Dienes, so had her daughter-in-law, Jane Birkel, call.  Read the documentation from the call and clarified for her to explain to the pt.

## 2013-10-06 NOTE — Telephone Encounter (Signed)
Pt called because "still very sore, but percocet too strong."  Also itching from breast binder.  Pt stated that she has had good luck with ultram in the past.  I advised to d/c breast binder and I called in ultram, see script.

## 2013-10-12 ENCOUNTER — Ambulatory Visit (HOSPITAL_BASED_OUTPATIENT_CLINIC_OR_DEPARTMENT_OTHER): Payer: Medicare Other | Admitting: Oncology

## 2013-10-12 ENCOUNTER — Telehealth: Payer: Self-pay | Admitting: Oncology

## 2013-10-12 ENCOUNTER — Encounter: Payer: Self-pay | Admitting: Radiation Oncology

## 2013-10-12 VITALS — BP 156/99 | HR 62 | Temp 97.8°F | Resp 20 | Ht 64.0 in | Wt 248.5 lb

## 2013-10-12 DIAGNOSIS — C50419 Malignant neoplasm of upper-outer quadrant of unspecified female breast: Secondary | ICD-10-CM

## 2013-10-12 DIAGNOSIS — C50412 Malignant neoplasm of upper-outer quadrant of left female breast: Secondary | ICD-10-CM

## 2013-10-12 DIAGNOSIS — Z170 Estrogen receptor positive status [ER+]: Secondary | ICD-10-CM

## 2013-10-12 DIAGNOSIS — Z9189 Other specified personal risk factors, not elsewhere classified: Secondary | ICD-10-CM

## 2013-10-12 DIAGNOSIS — M8580 Other specified disorders of bone density and structure, unspecified site: Secondary | ICD-10-CM

## 2013-10-12 DIAGNOSIS — C50919 Malignant neoplasm of unspecified site of unspecified female breast: Secondary | ICD-10-CM | POA: Insufficient documentation

## 2013-10-12 NOTE — Telephone Encounter (Signed)
, °

## 2013-10-12 NOTE — Progress Notes (Signed)
Location of Breast Cancer: left, 1 o'clock, upper outer  Histology per Pathology Report:  09/29/13 . Breast, lumpectomy, Left - INVASIVE DUCTAL CARCINOMA, SEE COMMENT. - INVASIVE TUMOR IS 0.2 MM FROM NEAREST MARGIN (POSTERIOR). - NEGATIVE FOR LYMPH VASCULAR INVASION. - DUCTAL CARCINOMA IN SITU. - IN SITU CARCINOMA IS 1 MM FROM NEAREST MARGIN (POSTERIOR). 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 - SEE TUMOR SYNOPTIC TEMPLATE BELOW. 2. Lymph node, sentinel, biopsy, Left #1 - ONE LYMPH NODE, NEGATIVE FOR TUMOR (0/1). 3. Lymph node, sentinel, biopsy, Left #2 - ONE LYMPH NODE, NEGATIVE FOR TUMOR (0/1).  Receptor Status: ER(90%), PR (905), Her2-neu (-)  Did patient present with symptoms (if so, please note symptoms) or was this found on screening mammography?: screening mammograom  Past/Anticipated interventions by surgeon, if any: left lumpectomy, 2 nodes sampled  Past/Anticipated interventions by medical oncology, if any: Chemotherapy , Dr [name]:  once patient completes radiation therapy we will plan on starting her on adjuvant antiestrogen therapy with an aromatase inhibitor such as Arimidex or Aromasin.patient understands all of these drugs work about the same. I have given her information on Aromasin. We discussed side effects risks and benefits. We did discuss getting a bone density scan on her since there can be some bone loss with these drugs.  Patient will be seen back after completion of radiation therapy.  Lymphedema issues, if any: no  Pain issues, if any:  Post op tenderness, left breast  SAFETY ISSUES:  Prior radiation? no  Pacemaker/ICD? no  Possible current pregnancy? no  Is the patient on methotrexate? 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     Andria Rhein, RN 10/12/2013,2:07 PM

## 2013-10-12 NOTE — Patient Instructions (Signed)
Exemestane tablets What is this medicine? EXEMESTANE (ex e MES tane) blocks the production of the hormone estrogen. Some types of breast cancer depend on estrogen to grow, and this medicine can stop tumor growth by blocking estrogen production. This medicine is for the treatment of breast cancer in postmenopausal women only. This medicine may be used for other purposes; ask your health care provider or pharmacist if you have questions. COMMON BRAND NAME(S): Aromasin What should I tell my health care provider before I take this medicine? They need to know if you have any of these conditions: -an unusual or allergic reaction to exemestane, other medicines, foods, dyes, or preservatives -pregnant or trying to get pregnant -breast-feeding How should I use this medicine? Take this medicine by mouth with a glass of water. Follow the directions on the prescription label. Take your doses at regular intervals after a meal. Do not take your medicine more often than directed. Do not stop taking except on the advice of your doctor or health care professional. Contact your pediatrician regarding the use of this medicine in children. Special care may be needed. Overdosage: If you think you have taken too much of this medicine contact a poison control center or emergency room at once. NOTE: This medicine is only for you. Do not share this medicine with others. What if I miss a dose? If you miss a dose, take the next dose as usual. Do not try to make up the missed dose. Do not take double or extra doses. What may interact with this medicine? Do not take this medicine with any of the following medications: -female hormones, like estrogens and birth control pills This medicine may also interact with the following medications: -androstenedione -phenytoin -rifabutin, rifampin, or rifapentine -St. John's Wort This list may not describe all possible interactions. Give your health care provider a list of all the  medicines, herbs, non-prescription drugs, or dietary supplements you use. Also tell them if you smoke, drink alcohol, or use illegal drugs. Some items may interact with your medicine. What should I watch for while using this medicine? Visit your doctor or health care professional for regular checks on your progress. If you experience hot flashes or sweating while taking this medicine, avoid alcohol, smoking and drinks with caffeine. This may help to decrease these side effects. What side effects may I notice from receiving this medicine? Side effects that you should report to your doctor or health care professional as soon as possible: -any new or unusual symptoms -changes in vision -fever -leg or arm swelling -pain in bones, joints, or muscles -pain in hips, back, ribs, arms, shoulders, or legs Side effects that usually do not require medical attention (report to your doctor or health care professional if they continue or are bothersome): -difficulty sleeping -headache -hot flashes -sweating -unusually weak or tired This list may not describe all possible side effects. Call your doctor for medical advice about side effects. You may report side effects to FDA at 1-800-FDA-1088. Where should I keep my medicine? Keep out of the reach of children. Store at room temperature between 15 and 30 degrees C (59 and 86 degrees F). Throw away any unused medicine after the expiration date. NOTE: This sheet is a summary. It may not cover all possible information. If you have questions about this medicine, talk to your doctor, pharmacist, or health care provider.  2014, Elsevier/Gold Standard. (2007-12-15 11:48:29)  Osteoporosis Throughout your life, your body breaks down old bone and replaces it with  new bone. As you get older, your body does not replace bone as quickly as it breaks it down. By the age of 61 years, most people begin to gradually lose bone because of the imbalance between bone loss and  replacement. Some people lose more bone than others. Bone loss beyond a specified normal degree is considered osteoporosis.  Osteoporosis affects the strength and durability of your bones. The inside of the ends of your bones and your flat bones, like the bones of your pelvis, look like honeycomb, filled with tiny open spaces. As bone loss occurs, your bones become less dense. This means that the open spaces inside your bones become bigger and the walls between these spaces become thinner. This makes your bones weaker. Bones of a person with osteoporosis can become so weak that they can break (fracture) during minor accidents, such as a simple fall. CAUSES  The following factors have been associated with the development of osteoporosis:  Smoking.  Drinking more than 2 alcoholic drinks several days per week.  Long-term use of certain medicines:  Corticosteroids.  Chemotherapy medicines.  Thyroid medicines.  Antiepileptic medicines.  Gonadal hormone suppression medicine.  Immunosuppression medicine.  Being underweight.  Lack of physical activity.  Lack of exposure to the sun. This can lead to vitamin D deficiency.  Certain medical conditions:  Certain inflammatory bowel diseases, such as Crohn disease and ulcerative colitis.  Diabetes.  Hyperthyroidism.  Hyperparathyroidism. RISK FACTORS Anyone can develop osteoporosis. However, the following factors can increase your risk of developing osteoporosis:  Gender Women are at higher risk than men.  Age Being older than 43 years increases your risk.  Ethnicity White and Asian people have an increased risk.  Weight Being extremely underweight can increase your risk of osteoporosis.  Family history of osteoporosis Having a family member who has developed osteoporosis can increase your risk. SYMPTOMS  Usually, people with osteoporosis have no symptoms.  DIAGNOSIS  Signs during a physical exam that may prompt your caregiver  to suspect osteoporosis include:  Decreased height. This is usually caused by the compression of the bones that form your spine (vertebrae) because they have weakened and become fractured.  A curving or rounding of the upper back (kyphosis). To confirm signs of osteoporosis, your caregiver may request a procedure that uses 2 low-dose X-ray beams with different levels of energy to measure your bone mineral density (dual-energy X-ray absorptiometry [DXA]). Also, your caregiver may check your level of vitamin D. TREATMENT  The goal of osteoporosis treatment is to strengthen bones in order to decrease the risk of bone fractures. There are different types of medicines available to help achieve this goal. Some of these medicines work by slowing the processes of bone loss. Some medicines work by increasing bone density. Treatment also involves making sure that your levels of calcium and vitamin D are adequate. PREVENTION  There are things you can do to help prevent osteoporosis. Adequate intake of calcium and vitamin D can help you achieve optimal bone mineral density. Regular exercise can also help, especially resistance and weight-bearing activities. If you smoke, quitting smoking is an important part of osteoporosis prevention. MAKE SURE YOU:  Understand these instructions.  Will watch your condition.  Will get help right away if you are not doing well or get worse. FOR MORE INFORMATION www.osteo.org and EquipmentWeekly.com.ee Document Released: 05/22/2005 Document Revised: 12/07/2012 Document Reviewed: 07/27/2011 [facility] Patient Information 2014 [place], [place].

## 2013-10-13 ENCOUNTER — Ambulatory Visit: Payer: Medicare Other

## 2013-10-13 ENCOUNTER — Encounter: Payer: Self-pay | Admitting: Oncology

## 2013-10-13 ENCOUNTER — Ambulatory Visit: Payer: Medicare Other | Admitting: Radiation Oncology

## 2013-10-13 NOTE — Progress Notes (Signed)
OFFICE PROGRESS NOTE  CC  Ashley Chou, NP [facility] [address] [place] [address] Dr. [name]  Dr. Arloa Koh  DIAGNOSIS: 68 year old with new left breast cancer  STAGE:  Breast cancer of upper-outer quadrant of left female breast  Primary site: Breast (Left)  Staging method: AJCC 7th Edition  Clinical: Stage IA (T1b, N0, cM0)  Summary: Stage IA (T1b, N0, cM0)  PRIOR THERAPY:  1. screening mammogram on 08/09/2013 at [facility] she was felt to have a mass at 1:00 along the left breast suggestive of malignancy. Ultrasound showed a 0.5 x 0.9 cm mass within the upper-outer quadrant. An ultrasound-guided biopsy on 08/16/2013 was diagnostic for invasive ductal/DCIS. The invasive carcinoma was felt to be grade 1. This was ER/PR positive and HER-2/neu negative. Breast MR on 08/31/2013 showed biopsy changes along the upper-outer quadrant of the left breast with a 7 mm nodular area of enhancement. She had a right-sided breast biopsy at 5:00 which was low suspicion for malignancy. This was benign  2. S/p left breast lumpectomy on 09/29/13 with the final pathology revealing: FINAL DIAGNOSIS Diagnosis 1. Breast, lumpectomy, Left - INVASIVE DUCTAL CARCINOMA, SEE COMMENT. 0.9 cm - INVASIVE TUMOR IS 0.2 MM FROM NEAREST MARGIN (POSTERIOR). - NEGATIVE FOR LYMPH VASCULAR INVASION. - DUCTAL CARCINOMA IN SITU. - IN SITU CARCINOMA IS 1 MM FROM NEAREST MARGIN (POSTERIOR). 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 - SEE TUMOR SYNOPTIC TEMPLATE BELOW. 2. Lymph node, sentinel, biopsy, Left #1 - ONE LYMPH NODE, NEGATIVE FOR TUMOR (0/1). 3. Lymph node, sentinel, biopsy, Left #2 - ONE LYMPH NODE, NEGATIVE FOR TUMOR (0/1). TNM: pT1b, pN0, pMX  CURRENT THERAPY: proceed with radiation therapy  INTERVAL HISTORY: [name] 68 y.o. female returns for followup visit today. Clinically she seems to be doing well. She did have her lumpectomy  performed 05/29/2014. This revealed only 0.9 cm invasive ductal carcinoma sentinel nodes were negative for metastatic disease. Tumor was ER positive PR positive HER-2/neu negative low grade with a proliferation marker Ki-67 5% and low. Postoperatively she is doing well. She has healed very nicely. She has minimal tenderness. She has no signs of infections in the surgical wound. 10 point review of systems is negative.  MEDICAL HISTORY: Past Medical History  Diagnosis Date  . Tremor   . Bowel incontinence   . Bladder incontinence   . High blood pressure   . Anemia   . Depression   . Arthritis   . Cancer     breast cancer  . GERD (gastroesophageal reflux disease)   . Full dentures   . HOH (hard of hearing)   . Sleep apnea     has a cpap-5 hr  . Stroke 1980  . Complication of anesthesia     hard to wake up  . Anxiety   . Shortness of breath   . Seizures     no meds  . Chronic diarrhea   . Breast cancer 08/16/13    left, 1 o'clock    ALLERGIES:  is allergic to codeine.  MEDICATIONS:  Current Outpatient Prescriptions  Medication Sig Dispense Refill  . amLODipine (NORVASC) 5 MG tablet Take 5 mg by mouth daily.       Marland Kitchen atenolol (TENORMIN) 50 MG tablet Take 50 mg by mouth daily.       . citalopram (CELEXA) 40 MG tablet Take 40 mg by mouth daily.       . clonazePAM (  KLONOPIN) 1 MG tablet Take by mouth 3 (three) times daily as needed.       Marland Kitchen HYDROcodone-acetaminophen (NORCO/VICODIN) 5-325 MG per tablet Take 1-2 tablets by mouth every 4 (four) hours as needed.  30 tablet  0  . levothyroxine (SYNTHROID, LEVOTHROID) 50 MCG tablet Take 50 mcg by mouth daily before breakfast.       . nystatin cream (MYCOSTATIN) Apply 1 application topically as needed.       . ranitidine (ZANTAC) 150 MG tablet Take 150 mg by mouth 2 (two) times daily.       . simvastatin (ZOCOR) 40 MG tablet Take 40 mg by mouth daily at 6 PM.       . traMADol (ULTRAM) 50 MG tablet Take 1-2 tablets (50-100 mg total) by  mouth every 6 (six) hours as needed.  30 tablet  1   No current facility-administered medications for this visit.    SURGICAL HISTORY:  Past Surgical History  Procedure Laterality Date  . Total hip arthroplasty  2005    rt total hip  . Abdominal hysterectomy    . Ovarian cysts    . Back surgery  2005    lumbar fusion  . Colonoscopy    . Multiple tooth extractions    . Breast lumpectomy with needle localization and axillary sentinel lymph node bx Left 09/29/2013    Procedure: BREAST LUMPECTOMY WITH NEEDLE LOCALIZATION AND AXILLARY SENTINEL LYMPH NODE BX;  Surgeon: [name], MD;  Location: [facility];  Service: General;  Laterality: Left;  clean wound class    REVIEW OF SYSTEMS:  Pertinent items are noted in HPI.     PHYSICAL EXAMINATION: Blood pressure 156/99, pulse 62, temperature 97.8 F (36.6 C), temperature source Oral, resp. rate 20, height 5' 4"  (1.626 m), weight 248 lb 8 oz (112.719 kg). Body mass index is 42.63 kg/(m^2). ECOG PERFORMANCE STATUS: 0 - Asymptomatic   General appearance: alert, cooperative and appears stated age Lymph nodes: Cervical, supraclavicular, and axillary nodes normal. Resp: clear to auscultation bilaterally Back: symmetric, no curvature. ROM normal. No CVA tenderness. Cardio: regular rate and rhythm GI: soft, non-tender; bowel sounds normal; no masses,  no organomegaly Extremities: extremities normal, atraumatic, no cyanosis or edema Neurologic: Grossly normal Breasts: breasts appear normal, no suspicious masses, no skin or nipple changes or axillary nodes.   LABORATORY DATA: Lab Results  Component Value Date   WBC 7.5 09/01/2013   HGB 15.6 09/01/2013   HCT 46.9* 09/01/2013   MCV 96.5 09/01/2013   PLT 143* 09/01/2013      Chemistry      Component Value Date/Time   NA 144 09/01/2013 1221   K 4.2 09/01/2013 1221   CO2 30* 09/01/2013 1221   BUN 18.8 09/01/2013 1221   CREATININE 1.3* 09/01/2013 1221      Component Value Date/Time    CALCIUM 9.2 09/01/2013 1221   ALKPHOS 48 09/01/2013 1221   AST 17 09/01/2013 1221   ALT 16 09/01/2013 1221   BILITOT 0.78 09/01/2013 1221     ADDITIONAL INFORMATION: 1. CHROMOGENIC IN-SITU HYBRIDIZATION Results: HER-2/NEU BY CISH - NO AMPLIFICATION OF HER-2 DETECTED. RESULT RATIO OF HER2: CEP 17 SIGNALS 1.27 AVERAGE HER2 COPY NUMBER PER CELL 1.65 REFERENCE RANGE NEGATIVE HER2/Chr17 Ratio <2.0 and Average HER2 copy number <4.0 EQUIVOCAL HER2/Chr17 Ratio <2.0 and Average HER2 copy number 4.0 and <6.0 POSITIVE HER2/Chr17 Ratio >=2.0 and/or Average HER2 copy number >=6.0 Ashley Cutter MD Pathologist, Electronic Signature ( Signed 10/05/2013) FINAL DIAGNOSIS  Diagnosis 1. Breast, lumpectomy, Left - INVASIVE DUCTAL CARCINOMA, SEE COMMENT. - INVASIVE TUMOR IS 0.2 MM FROM NEAREST MARGIN (POSTERIOR). - NEGATIVE FOR LYMPH VASCULAR INVASION. - DUCTAL CARCINOMA IN SITU. - IN SITU CARCINOMA IS 1 MM FROM NEAREST MARGIN (POSTERIOR). 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 - SEE TUMOR SYNOPTIC TEMPLATE BELOW. 2. Lymph node, sentinel, biopsy, Left #1 - ONE LYMPH NODE, NEGATIVE FOR TUMOR (0/1). 3. Lymph node, sentinel, biopsy, Left #2 - ONE LYMPH NODE, NEGATIVE FOR TUMOR (0/1). 1 of 3 FINAL for [name], [name] ([id number]) Microscopic Comment 1. BREAST, INVASIVE TUMOR, WITH LYMPH NODE SAMPLING Specimen, including laterality and lymph node sampling (sentinel, non-sentinel): Left breast. Procedure: Lumpectomy. Histologic type: Ductal. Grade: I of III Tubule formation: 2 Nuclear pleomorphism: 2 Mitotic:1 Tumor size (gross measurement): 0.9 cm Margins: Invasive, distance to closest margin: 2 mm In-situ, distance to closest margin: 1 mm If margin positive, focally or broadly: N/A Lymphovascular invasion: Absent. Ductal carcinoma in situ: Present. Grade: I of III Extensive intraductal component: Absent. Lobular neoplasia: Present. Tumor focality: Unifocal. Treatment  effect: None. If present, treatment effect in breast tissue, lymph nodes or both: N/A Extent of tumor: Skin: N/A Nipple: N/A Skeletal muscle: N/A Lymph nodes: Examined: 2 Sentinel 0 Non-sentinel 2 Total Lymph nodes with metastasis: 0 Isolated tumor cells (< 0.2 mm): N/A Micrometastasis: (> 0.2 mm and < 2.0 mm): N/A Macrometastasis: (> 2.0 mm): N/A Extracapsular extension: N/A Breast prognostic profile: Estrogen receptor: Not repeated, previous study demonstrated 90% positivity, [id number]. Progesterone receptor: Not repeated, previous study demonstrated 90% positivity, [id number]. Her 2 neu: Repeated, previous study demonstrated no amplification (1.23), [id number]. Ki-67: Not repeated, previous study demonstrated 5% proliferation rate, [id number]. Non-neoplastic breast: Previous biopsy site, fibrocystic change, usual ductal hyperplasia, and columnar cell hyperplasia/change without atypia. Microcalcifications are present in benign lobules. TNM: pT1b, pN0, pMX Comments: None. (CR:ecj 10/01/2013)  RADIOGRAPHIC STUDIES:  Nm Sentinel Node Inj-no Rpt (breast)  09/29/2013   CLINICAL DATA: left breast cancer   Sulfur colloid was injected intradermally by the nuclear medicine  technologist for breast cancer sentinel node localization.     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 She is now status post left lumpectomy with sentinel lymph node biopsy on 09/29/2013. The final pathology revealed a 0.9 cm low grade invasive ductal carcinoma that was ER positive PR positive HER-2/neu negative with a low proliferation marker Ki-67 of 5%. Postoperatively she is doing well.  #2 patient is referred to radiation oncology for discussion of adjuvant radiation therapy to the left breast. She understands the rationale for this. She has met radiation oncology previously Dr. Valere Dross she will be seeing him again.  #3 once  patient completes radiation therapy we will plan on starting her on adjuvant antiestrogen therapy with an aromatase inhibitor such as Arimidex or Aromasin.patient understands all of these drugs work about the same. I have given her information on Aromasin.  We discussed side effects risks and benefits. We did discuss getting a bone density scan on her since there can be some bone loss with these drugs.  #4 patient will be seen back after completion of radiation therapy.   All questions were answered. The patient knows to call the clinic with any problems, questions or concerns. We can certainly see the patient much sooner if necessary.  I spent 20 minutes counseling the patient face to face. The total time spent in the appointment was 25 minutes.  Marcy Panning, MD Medical/Oncology [facility] [phone] (beeper) [phone] (Office)  10/13/2013, 9:19 PM

## 2013-10-14 ENCOUNTER — Encounter: Payer: Self-pay | Admitting: Radiation Oncology

## 2013-10-14 ENCOUNTER — Ambulatory Visit
Admission: RE | Admit: 2013-10-14 | Discharge: 2013-10-14 | Disposition: A | Discharge status: Home / Self Care | Payer: Medicare Other | Source: Ambulatory Visit | Attending: Radiation Oncology | Admitting: Radiation Oncology

## 2013-10-14 VITALS — BP 148/92 | HR 69 | Temp 98.2°F | Resp 20 | Wt 250.7 lb

## 2013-10-14 DIAGNOSIS — Z9010 Acquired absence of unspecified breast and nipple: Secondary | ICD-10-CM | POA: Insufficient documentation

## 2013-10-14 DIAGNOSIS — C50412 Malignant neoplasm of upper-outer quadrant of left female breast: Secondary | ICD-10-CM

## 2013-10-14 DIAGNOSIS — C50419 Malignant neoplasm of upper-outer quadrant of unspecified female breast: Secondary | ICD-10-CM | POA: Insufficient documentation

## 2013-10-14 HISTORY — DX: Malignant neoplasm of unspecified site of unspecified female breast: C50.919

## 2013-10-14 NOTE — Progress Notes (Signed)
CC: Dr. 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 I first saw her at the [facility] on 09/01/2013. At the time of a screening mammogram on 08/09/2013 at [facility] she was felt to have a mass at 1:00 along the left breast suggestive of malignancy. Ultrasound showed a 0.5 x 0.9 cm mass within the upper-outer quadrant. An ultrasound-guided biopsy on 08/16/2013 was diagnostic for invasive ductal/DCIS. The invasive carcinoma was felt to be grade 1. This was ER/PR positive and HER-2/neu negative. Breast MR on 08/31/2013 showed biopsy changes along the upper-outer quadrant of the left breast with a 7 mm nodular area of enhancement. She had a right-sided breast biopsy at 5:00 which was low suspicion for malignancy. This was benign. She underwent a left partial mastectomy and sentinel lymph node biopsy by Dr. Barry Dienes on 09/29/2013. His then have a 0.9 cm invasive ductal carcinoma along with DCIS. The closest margin for invasive disease was 0.2 cm, and 0.1 cm for in situ disease. Margins were closest posteriorly for both DCIS and invasive disease. 2 sentinel lymph nodes were free of metastatic disease. She was seen by Dr. [name] who plans on post radiation anti-estrogen therapy.  Physical examination: Alert and oriented. Filed Vitals:   10/14/13 0927  BP: 148/92  Pulse: 69  Temp: 98.2 F (36.8 C)  Resp: 20   Head and neck examination: Grossly unremarkable. Nodes: Without palpable cervical, supraclavicular, or axillary lymphadenopathy. Her left axillary sentinel lymph node biopsy wound is healing well. Chest: Lungs clear. Breasts: There is a partial mastectomy wound at approximately 2 -3:00 along the upper-outer quadrant/lateral left breast. The wound is healing well. There is central erythema along the left breast. No masses are appreciated. Right breast without masses or  lesions. Extremities: Without edema.  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 Her surgical margins for both invasive disease and in situ disease are satisfactory. She is a candidate for a boost. She may be a candidate for hypo-fractionated radiation therapy depending on medial to lateral tangent separation. She may be a candidate for her deep inspiration/breath-hold technology to avoid the cardiac silhouette. There is no need for a preradiation mammogram since her DCIS was not seen on mammography. I discussed the potential acute and late toxicities of radiation therapy and consent is signed today.  Plan: She will see Dr. Barry Dienes next week and return here for simulation/treatment planning in approximately 2 weeks. She will be offered antiestrogen therapy following completion of radiation therapy through Dr. [name].  30 minutes was spent face-to-face with the patient, primarily counseling the patient and coordinating her care.

## 2013-10-14 NOTE — Progress Notes (Signed)
Please see the Nurse Progress Note in the MD Initial Consult Encounter for this patient. 

## 2013-10-14 NOTE — Progress Notes (Signed)
Pt uses Nystatin cream for fungal rashes in her groin area and under her breasts. She has fine pink rash on her abdomen she states she noticed 2 days after her surgery. Pt and daughter-in-law uncertain if this was caused by pre op wash or binder pt wore following surgery. Pt states rash is much improved, itches at times but not often.

## 2013-10-18 ENCOUNTER — Encounter (INDEPENDENT_AMBULATORY_CARE_PROVIDER_SITE_OTHER): Payer: Self-pay | Admitting: General Surgery

## 2013-10-18 ENCOUNTER — Ambulatory Visit (INDEPENDENT_AMBULATORY_CARE_PROVIDER_SITE_OTHER): Payer: Medicare Other | Admitting: General Surgery

## 2013-10-18 VITALS — BP 138/80 | HR 68 | Resp 14 | Ht 64.0 in | Wt 249.8 lb

## 2013-10-18 DIAGNOSIS — C50419 Malignant neoplasm of upper-outer quadrant of unspecified female breast: Secondary | ICD-10-CM

## 2013-10-18 DIAGNOSIS — C50412 Malignant neoplasm of upper-outer quadrant of left female breast: Secondary | ICD-10-CM

## 2013-10-18 NOTE — Patient Instructions (Signed)
Follow up in 3-4 weeks for me to look again at breast.    Call if you develop fever/ chills/or increased soreness in left breast.

## 2013-10-18 NOTE — Progress Notes (Signed)
HISTORY: Pt is 3 weeks s/p left lumpectomy/SLN bx for breast cancer/dcis.  Margins are OK.  She has a bad cold, and has had been on sulfa for 2 weeks.  She is coughing.      EXAM: General:  Alert and oriented.   Incision:  Left breast has some lymphedema.  Moderate seroma.     PATHOLOGY: Diagnosis 1. Breast, lumpectomy, Left - INVASIVE DUCTAL CARCINOMA, SEE COMMENT. - INVASIVE TUMOR IS 0.2 MM FROM NEAREST MARGIN (POSTERIOR). - NEGATIVE FOR LYMPH VASCULAR INVASION. - DUCTAL CARCINOMA IN SITU. - IN SITU CARCINOMA IS 1 MM FROM NEAREST MARGIN (POSTERIOR). 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 - SEE TUMOR SYNOPTIC TEMPLATE BELOW. 2. Lymph node, sentinel, biopsy, Left #1 - ONE LYMPH NODE, NEGATIVE FOR TUMOR (0/1). 3. Lymph node, sentinel, biopsy, Left #2 - ONE LYMPH NODE, NEGATIVE FOR TUMOR (0/1).   ASSESSMENT AND PLAN:   Breast cancer of upper-outer quadrant of left female breast Patient does not need any additional treatment for margins or lymph nodes. She has followup arranged with radiation and with oncology.  She does appear to have some breast lymphedema. She has a moderate-sized seroma. The patient is feeling very well and declined seroma drainage today. I will recheck her in a few weeks to make sure that she is doing okay. She is advised to call if she develops fever, chills, redness or increasing discomfort of her left breast.      Milus Height, MD Surgical Oncology, [facility]  Alvester Chou, NP Alvester Chou, NP

## 2013-10-18 NOTE — Assessment & Plan Note (Signed)
Patient does not need any additional treatment for margins or lymph nodes. She has followup arranged with radiation and with oncology.  She does appear to have some breast lymphedema. She has a moderate-sized seroma. The patient is feeling very well and declined seroma drainage today. I will recheck her in a few weeks to make sure that she is doing okay. She is advised to call if she develops fever, chills, redness or increasing discomfort of her left breast.

## 2013-10-22 NOTE — Progress Notes (Signed)
[facility] Psychosocial Distress Screening Clinical Social Work  Clinical Social Work was referred by distress screening protocol.  The patient scored a 6 on the Psychosocial Distress Thermometer which indicates moderate distress. Clinical Social Worker Intern telephoned to assess for distress and other psychosocial needs. Patient indicated she was not feeling well due to a cold and had spoken with Dr about it.  Patient stated she had a good support system with a brother and a friend.  Patient indicated some degree of anxiety and felt comfortable sharing with support system.  CSWI provided supportive listening.  CSWI made sure Patient was aware of available resources and Patient agreed to reach out as needed.   Clinical Social Worker follow up needed: no  If yes, follow up plan:   Taraya Steward S. Bennet Work Intern [employer] [phone]

## 2013-10-25 ENCOUNTER — Ambulatory Visit
Admission: RE | Admit: 2013-10-25 | Discharge: 2013-10-25 | Disposition: A | Discharge status: Home / Self Care | Payer: Medicare Other | Source: Ambulatory Visit | Attending: Oncology | Admitting: Oncology

## 2013-10-25 ENCOUNTER — Ambulatory Visit
Admission: RE | Admit: 2013-10-25 | Discharge: 2013-10-25 | Disposition: A | Discharge status: Home / Self Care | Payer: Medicare Other | Source: Ambulatory Visit | Attending: Radiation Oncology | Admitting: Radiation Oncology

## 2013-10-25 DIAGNOSIS — J40 Bronchitis, not specified as acute or chronic: Secondary | ICD-10-CM | POA: Insufficient documentation

## 2013-10-25 DIAGNOSIS — M8580 Other specified disorders of bone density and structure, unspecified site: Secondary | ICD-10-CM

## 2013-10-25 DIAGNOSIS — B372 Candidiasis of skin and nail: Secondary | ICD-10-CM | POA: Insufficient documentation

## 2013-10-25 DIAGNOSIS — C50419 Malignant neoplasm of upper-outer quadrant of unspecified female breast: Secondary | ICD-10-CM | POA: Insufficient documentation

## 2013-10-25 DIAGNOSIS — R0789 Other chest pain: Secondary | ICD-10-CM | POA: Insufficient documentation

## 2013-10-25 DIAGNOSIS — L988 Other specified disorders of the skin and subcutaneous tissue: Secondary | ICD-10-CM | POA: Insufficient documentation

## 2013-10-25 DIAGNOSIS — Y842 Radiological procedure and radiotherapy as the cause of abnormal reaction of the patient, or of later complication, without mention of misadventure at the time of the procedure: Secondary | ICD-10-CM | POA: Insufficient documentation

## 2013-10-25 DIAGNOSIS — C50412 Malignant neoplasm of upper-outer quadrant of left female breast: Secondary | ICD-10-CM

## 2013-10-25 DIAGNOSIS — Z510 Encounter for antineoplastic radiation therapy: Secondary | ICD-10-CM | POA: Insufficient documentation

## 2013-10-25 NOTE — Progress Notes (Signed)
Complex simulation/treatment planning note: The patient was taken to the CT simulator. A Vac lock immobilization device was constructed on a custom breast board. Her left breast borders were marked with radiopaque wires. Her partial mastectomy scar was also marked. She was scanned free breathing. She is also scanned with deep inspiration and breath-hold and there was no significant movement of the cardiac silhouette. Her normal structures were contoured and also contoured her left breast tumor bed. Therefore she is being planned with free breathing. She is setup to medial and lateral left breast tangents. 2 sets of multileaf collimators are designed to conform the field. I am prescribing 4680 cGy in 26 sessions to be followed by a photon boost for a further 1400 cGy in 7 sessions. She is now ready for 3-D simulation.

## 2013-10-26 ENCOUNTER — Telehealth: Payer: Self-pay | Admitting: *Deleted

## 2013-10-26 NOTE — Telephone Encounter (Signed)
Received call from pt who states she received a message from Dr Valere Dross last night about her radiation treatments. She states she had been told she would only need 6 treatments and is asking about the number of treatments she is scheduled to receive. Informed pt she is scheduled for approximately 6 weeks of treatment. Pt verbalized understanding.

## 2013-10-28 ENCOUNTER — Encounter: Payer: Self-pay | Admitting: Radiation Oncology

## 2013-10-28 NOTE — Progress Notes (Addendum)
3-D simulation note: The patient underwent 3-D simulation for treatment to her left breast. She is being treated free breathing. She is setup to tangential fields. She has field in field forward planning for dose homogeneity. 2 sets of multileaf collimators are used for one tangent and 3 for the tangent for a total of 5 complex treatment devices. Dose volume histograms were obtained for the target structures and also avoidance structures including the heart and lungs. I prescribing 4680 cGy and 26 sessions utilizing 15 MV photons. I chose the 100% isodose curve. I requesting daily align RT/optical guidance.

## 2013-11-01 ENCOUNTER — Telehealth: Payer: Self-pay | Admitting: *Deleted

## 2013-11-01 ENCOUNTER — Ambulatory Visit
Admission: RE | Admit: 2013-11-01 | Discharge: 2013-11-01 | Disposition: A | Discharge status: Home / Self Care | Payer: Medicare Other | Source: Ambulatory Visit | Attending: Radiation Oncology | Admitting: Radiation Oncology

## 2013-11-01 DIAGNOSIS — C50412 Malignant neoplasm of upper-outer quadrant of left female breast: Secondary | ICD-10-CM

## 2013-11-01 NOTE — Progress Notes (Signed)
Simulation verification note: The patient underwent simulation verification for treatment to her left breast. Her isocenter is in good position and the multileaf collimators contoured the treatment volume appropriately. 

## 2013-11-01 NOTE — Telephone Encounter (Signed)
Notified patient of normal bone density test per Dr [name]

## 2013-11-02 ENCOUNTER — Ambulatory Visit
Admission: RE | Admit: 2013-11-02 | Discharge: 2013-11-02 | Disposition: A | Discharge status: Home / Self Care | Payer: Medicare Other | Source: Ambulatory Visit | Attending: Radiation Oncology | Admitting: Radiation Oncology

## 2013-11-02 ENCOUNTER — Ambulatory Visit: Payer: Medicare Other

## 2013-11-02 ENCOUNTER — Ambulatory Visit: Payer: Medicare Other | Admitting: Radiation Oncology

## 2013-11-02 DIAGNOSIS — C50412 Malignant neoplasm of upper-outer quadrant of left female breast: Secondary | ICD-10-CM

## 2013-11-02 MED ORDER — ALRA NON-METALLIC DEODORANT (RAD-ONC)
1.0000 "application " | Freq: Once | TOPICAL | Status: AC
  Administered 2013-11-02: 1 via TOPICAL

## 2013-11-02 MED ORDER — RADIAPLEXRX EX GEL
Freq: Once | CUTANEOUS | Status: AC
  Administered 2013-11-02: 16:00:00 via TOPICAL

## 2013-11-02 NOTE — Progress Notes (Signed)
Post sim ed completed w/pt and daughter. Gave pt "Radiation and You" booklet w/all pertinent information marked and discussed, re: fatigue, skin irritation/care, nutrition, pain. Gave pt Radiaplex, [place] w/instructions for proper use. Pt and daughter verbalized understanding.

## 2013-11-03 ENCOUNTER — Ambulatory Visit: Payer: Medicare Other

## 2013-11-03 ENCOUNTER — Ambulatory Visit
Admission: RE | Admit: 2013-11-03 | Discharge: 2013-11-03 | Disposition: A | Discharge status: Home / Self Care | Payer: Medicare Other | Source: Ambulatory Visit | Attending: Radiation Oncology | Admitting: Radiation Oncology

## 2013-11-03 NOTE — Progress Notes (Signed)
Report received from [facility] - provided to Dr. [name].

## 2013-11-04 ENCOUNTER — Ambulatory Visit: Payer: Medicare Other

## 2013-11-04 ENCOUNTER — Ambulatory Visit
Admission: RE | Admit: 2013-11-04 | Discharge: 2013-11-04 | Disposition: A | Discharge status: Home / Self Care | Payer: Medicare Other | Source: Ambulatory Visit | Attending: Radiation Oncology | Admitting: Radiation Oncology

## 2013-11-05 ENCOUNTER — Ambulatory Visit
Admission: RE | Admit: 2013-11-05 | Discharge: 2013-11-05 | Disposition: A | Discharge status: Home / Self Care | Payer: Medicare Other | Source: Ambulatory Visit | Attending: Radiation Oncology | Admitting: Radiation Oncology

## 2013-11-05 ENCOUNTER — Ambulatory Visit: Payer: Medicare Other

## 2013-11-08 ENCOUNTER — Encounter: Payer: Self-pay | Admitting: Radiation Oncology

## 2013-11-08 ENCOUNTER — Ambulatory Visit
Admission: RE | Admit: 2013-11-08 | Discharge: 2013-11-08 | Disposition: A | Discharge status: Home / Self Care | Payer: Medicare Other | Source: Ambulatory Visit | Attending: Radiation Oncology | Admitting: Radiation Oncology

## 2013-11-08 ENCOUNTER — Ambulatory Visit: Payer: Medicare Other

## 2013-11-08 VITALS — BP 156/82 | HR 56 | Temp 98.7°F | Resp 20 | Wt 247.7 lb

## 2013-11-08 DIAGNOSIS — C50412 Malignant neoplasm of upper-outer quadrant of left female breast: Secondary | ICD-10-CM

## 2013-11-08 NOTE — Progress Notes (Signed)
Pt c/o pain in her neck and back of head which she states is chronic. She denies other pain, fatigue, loss of appetite. She is applying Radiaplex to left breast treatment area.

## 2013-11-08 NOTE — Progress Notes (Signed)
Weekly Management Note:  Site: Left breast Current Dose:  900  cGy Projected Dose: 4680  cGy followed by 7 fraction boost  Narrative: The patient is seen today for routine under treatment assessment. CBCT/MVCT images/port films were reviewed. The chart was reviewed.   No complaints today. He uses radio Plex gel.  Physical Examination:  Filed Vitals:   11/08/13 1556  BP: 156/82  Pulse: 56  Temp: 98.7 F (37.1 C)  Resp: 20  .  Weight: 247 lb 11.2 oz (112.356 kg). No significant skin changes.  Impression: Tolerating radiation therapy well.  Plan: Continue radiation therapy as planned.

## 2013-11-09 ENCOUNTER — Ambulatory Visit
Admission: RE | Admit: 2013-11-09 | Discharge: 2013-11-09 | Disposition: A | Discharge status: Home / Self Care | Payer: Medicare Other | Source: Ambulatory Visit | Attending: Radiation Oncology | Admitting: Radiation Oncology

## 2013-11-09 ENCOUNTER — Ambulatory Visit: Payer: Medicare Other

## 2013-11-10 ENCOUNTER — Ambulatory Visit
Admission: RE | Admit: 2013-11-10 | Discharge: 2013-11-10 | Disposition: A | Discharge status: Home / Self Care | Payer: Medicare Other | Source: Ambulatory Visit | Attending: Radiation Oncology | Admitting: Radiation Oncology

## 2013-11-10 ENCOUNTER — Ambulatory Visit: Payer: Medicare Other

## 2013-11-11 ENCOUNTER — Ambulatory Visit: Payer: Medicare Other

## 2013-11-11 ENCOUNTER — Ambulatory Visit: Admission: RE | Admit: 2013-11-11 | Payer: Medicare Other | Source: Ambulatory Visit

## 2013-11-12 ENCOUNTER — Ambulatory Visit
Admission: RE | Admit: 2013-11-12 | Discharge: 2013-11-12 | Disposition: A | Discharge status: Home / Self Care | Payer: Medicare Other | Source: Ambulatory Visit | Attending: Radiation Oncology | Admitting: Radiation Oncology

## 2013-11-12 ENCOUNTER — Ambulatory Visit: Payer: Medicare Other

## 2013-11-15 ENCOUNTER — Encounter (HOSPITAL_COMMUNITY): Payer: Self-pay | Admitting: Emergency Medicine

## 2013-11-15 ENCOUNTER — Emergency Department (HOSPITAL_COMMUNITY)
Admission: EM | Admit: 2013-11-15 | Discharge: 2013-11-15 | Disposition: A | Discharge status: Home / Self Care | Payer: Medicare Other | Attending: Emergency Medicine | Admitting: Emergency Medicine

## 2013-11-15 ENCOUNTER — Emergency Department (HOSPITAL_COMMUNITY): Payer: Medicare Other

## 2013-11-15 ENCOUNTER — Ambulatory Visit
Admission: RE | Admit: 2013-11-15 | Discharge: 2013-11-15 | Disposition: A | Discharge status: Home / Self Care | Payer: Medicare Other | Source: Ambulatory Visit | Attending: Radiation Oncology | Admitting: Radiation Oncology

## 2013-11-15 ENCOUNTER — Ambulatory Visit: Payer: Medicare Other

## 2013-11-15 DIAGNOSIS — F411 Generalized anxiety disorder: Secondary | ICD-10-CM | POA: Insufficient documentation

## 2013-11-15 DIAGNOSIS — Z853 Personal history of malignant neoplasm of breast: Secondary | ICD-10-CM | POA: Insufficient documentation

## 2013-11-15 DIAGNOSIS — F329 Major depressive disorder, single episode, unspecified: Secondary | ICD-10-CM | POA: Insufficient documentation

## 2013-11-15 DIAGNOSIS — Z9981 Dependence on supplemental oxygen: Secondary | ICD-10-CM | POA: Insufficient documentation

## 2013-11-15 DIAGNOSIS — J209 Acute bronchitis, unspecified: Secondary | ICD-10-CM | POA: Insufficient documentation

## 2013-11-15 DIAGNOSIS — J40 Bronchitis, not specified as acute or chronic: Secondary | ICD-10-CM

## 2013-11-15 DIAGNOSIS — Z792 Long term (current) use of antibiotics: Secondary | ICD-10-CM | POA: Insufficient documentation

## 2013-11-15 DIAGNOSIS — Z79899 Other long term (current) drug therapy: Secondary | ICD-10-CM | POA: Insufficient documentation

## 2013-11-15 DIAGNOSIS — F3289 Other specified depressive episodes: Secondary | ICD-10-CM | POA: Insufficient documentation

## 2013-11-15 DIAGNOSIS — K219 Gastro-esophageal reflux disease without esophagitis: Secondary | ICD-10-CM | POA: Insufficient documentation

## 2013-11-15 DIAGNOSIS — G4730 Sleep apnea, unspecified: Secondary | ICD-10-CM | POA: Insufficient documentation

## 2013-11-15 DIAGNOSIS — Z8673 Personal history of transient ischemic attack (TIA), and cerebral infarction without residual deficits: Secondary | ICD-10-CM | POA: Insufficient documentation

## 2013-11-15 DIAGNOSIS — IMO0002 Reserved for concepts with insufficient information to code with codable children: Secondary | ICD-10-CM | POA: Insufficient documentation

## 2013-11-15 DIAGNOSIS — Z862 Personal history of diseases of the blood and blood-forming organs and certain disorders involving the immune mechanism: Secondary | ICD-10-CM | POA: Insufficient documentation

## 2013-11-15 DIAGNOSIS — G40909 Epilepsy, unspecified, not intractable, without status epilepticus: Secondary | ICD-10-CM | POA: Insufficient documentation

## 2013-11-15 DIAGNOSIS — M129 Arthropathy, unspecified: Secondary | ICD-10-CM | POA: Insufficient documentation

## 2013-11-15 LAB — CBC WITH DIFFERENTIAL/PLATELET
BASOS ABS: 0 10*3/uL (ref 0.0–0.1)
Basophils Relative: 0 % (ref 0–1)
Eosinophils Absolute: 0.2 10*3/uL (ref 0.0–0.7)
Eosinophils Relative: 2 % (ref 0–5)
HCT: 44.9 % (ref 36.0–46.0)
Hemoglobin: 14.7 g/dL (ref 12.0–15.0)
LYMPHS PCT: 24 % (ref 12–46)
Lymphs Abs: 2.6 10*3/uL (ref 0.7–4.0)
MCH: 32 pg (ref 26.0–34.0)
MCHC: 32.7 g/dL (ref 30.0–36.0)
MCV: 97.8 fL (ref 78.0–100.0)
Monocytes Absolute: 1.2 10*3/uL — ABNORMAL HIGH (ref 0.1–1.0)
Monocytes Relative: 11 % (ref 3–12)
NEUTROS PCT: 63 % (ref 43–77)
Neutro Abs: 6.7 10*3/uL (ref 1.7–7.7)
PLATELETS: 144 10*3/uL — AB (ref 150–400)
RBC: 4.59 MIL/uL (ref 3.87–5.11)
RDW: 14.5 % (ref 11.5–15.5)
WBC: 10.6 10*3/uL — AB (ref 4.0–10.5)

## 2013-11-15 LAB — BASIC METABOLIC PANEL
BUN: 14 mg/dL (ref 6–23)
CO2: 27 meq/L (ref 19–32)
Calcium: 9 mg/dL (ref 8.4–10.5)
Chloride: 101 mEq/L (ref 96–112)
Creatinine, Ser: 0.93 mg/dL (ref 0.50–1.10)
GFR calc Af Amer: 72 mL/min — ABNORMAL LOW (ref >90)
GFR, EST NON AFRICAN AMERICAN: 62 mL/min — AB (ref >90)
Glucose, Bld: 94 mg/dL (ref 70–99)
Potassium: 5.2 mEq/L (ref 3.7–5.3)
SODIUM: 139 meq/L (ref 137–147)

## 2013-11-15 LAB — URINALYSIS, ROUTINE W REFLEX MICROSCOPIC
Bilirubin Urine: NEGATIVE
Glucose, UA: NEGATIVE mg/dL
HGB URINE DIPSTICK: NEGATIVE
Ketones, ur: NEGATIVE mg/dL
Leukocytes, UA: NEGATIVE
NITRITE: NEGATIVE
PROTEIN: NEGATIVE mg/dL
Specific Gravity, Urine: 1.011 (ref 1.005–1.030)
UROBILINOGEN UA: 1 mg/dL (ref 0.0–1.0)
pH: 6.5 (ref 5.0–8.0)

## 2013-11-15 MED ORDER — LORAZEPAM 2 MG/ML IJ SOLN
1.0000 mg | Freq: Once | INTRAMUSCULAR | Status: AC
  Administered 2013-11-15: 1 mg via INTRAVENOUS

## 2013-11-15 MED ORDER — PREDNISONE 20 MG PO TABS: ORAL_TABLET | ORAL | Status: DC

## 2013-11-15 MED ORDER — PREDNISONE 20 MG PO TABS
60.0000 mg | ORAL_TABLET | Freq: Once | ORAL | Status: AC
  Administered 2013-11-15: 60 mg via ORAL
  Filled 2013-11-15: qty 3

## 2013-11-15 MED ORDER — AZITHROMYCIN 250 MG PO TABS
500.0000 mg | ORAL_TABLET | Freq: Once | ORAL | Status: AC
  Administered 2013-11-15: 500 mg via ORAL
  Filled 2013-11-15: qty 2

## 2013-11-15 MED ORDER — LORAZEPAM 2 MG/ML IJ SOLN
INTRAMUSCULAR | Status: AC
  Filled 2013-11-15: qty 1

## 2013-11-15 MED ORDER — IPRATROPIUM BROMIDE 0.02 % IN SOLN
0.5000 mg | Freq: Once | RESPIRATORY_TRACT | Status: AC
  Administered 2013-11-15: 0.5 mg via RESPIRATORY_TRACT
  Filled 2013-11-15: qty 2.5

## 2013-11-15 MED ORDER — IOHEXOL 300 MG/ML  SOLN
100.0000 mL | Freq: Once | INTRAMUSCULAR | Status: AC | PRN
  Administered 2013-11-15: 100 mL via INTRAVENOUS

## 2013-11-15 MED ORDER — ALBUTEROL SULFATE HFA 108 (90 BASE) MCG/ACT IN AERS
2.0000 | INHALATION_SPRAY | RESPIRATORY_TRACT | Status: DC | PRN
  Administered 2013-11-15: 2 via RESPIRATORY_TRACT
  Filled 2013-11-15: qty 6.7

## 2013-11-15 MED ORDER — AZITHROMYCIN 250 MG PO TABS: ORAL_TABLET | ORAL | Status: DC

## 2013-11-15 MED ORDER — IOHEXOL 350 MG/ML SOLN
100.0000 mL | Freq: Once | INTRAVENOUS | Status: AC | PRN
  Administered 2013-11-15: 100 mL via INTRAVENOUS

## 2013-11-15 MED ORDER — ALBUTEROL SULFATE (2.5 MG/3ML) 0.083% IN NEBU
5.0000 mg | INHALATION_SOLUTION | Freq: Once | RESPIRATORY_TRACT | Status: AC
  Administered 2013-11-15: 5 mg via RESPIRATORY_TRACT
  Filled 2013-11-15: qty 6

## 2013-11-15 NOTE — ED Notes (Signed)
Patient calling son to take her home

## 2013-11-15 NOTE — ED Notes (Signed)
Patient continues to have conversations with nursing staff without difficulty--able to handle secretions without difficulty Patient remains alert and oriented x 4 Patient in NAD

## 2013-11-15 NOTE — ED Provider Notes (Signed)
CSN: [id number]     Arrival date & time 11/15/13  1846 History   First MD Initiated Contact with Patient 11/15/13 2129     Chief Complaint  Patient presents with  . Cough      HPI Patient states that she has a hx of "seizures for several years, but it might be nerves."--patient states she isn't sure what it is  Patient alert and oriented x 4 during assessment with clear speech--able to handle secretions without difficulty  No post-ictal symptoms  Past Medical History  Diagnosis Date  . Tremor   . Bowel incontinence   . Bladder incontinence   . High blood pressure   . Anemia   . Depression   . Arthritis   . Cancer     breast cancer  . GERD (gastroesophageal reflux disease)   . Full dentures   . HOH (hard of hearing)   . Sleep apnea     has a cpap-5 hr  . Stroke 1980  . Complication of anesthesia     hard to wake up  . Anxiety   . Shortness of breath   . Seizures     no meds  . Chronic diarrhea   . Breast cancer 08/16/13    left, 1 o'clock   Past Surgical History  Procedure Laterality Date  . Total hip arthroplasty  2005    rt total hip  . Abdominal hysterectomy    . Ovarian cysts    . Back surgery  2005    lumbar fusion  . Colonoscopy    . Multiple tooth extractions    . Breast lumpectomy with needle localization and axillary sentinel lymph node bx Left 09/29/2013    Procedure: BREAST LUMPECTOMY WITH NEEDLE LOCALIZATION AND AXILLARY SENTINEL LYMPH NODE BX;  Surgeon: [name], MD;  Location: [facility];  Service: General;  Laterality: Left;  clean wound class   Family History  Problem Relation Age of Onset  . Ataxia Neg Hx   . Chorea Neg Hx   . Dementia Neg Hx   . Mental retardation Neg Hx   . Migraines Neg Hx   . Multiple sclerosis Neg Hx   . Neurofibromatosis Neg Hx   . Neuropathy Neg Hx   . Parkinsonism Neg Hx   . Seizures Neg Hx   . Lung cancer Brother   . Stroke Mother    History  Substance Use Topics  . Smoking status: Never  Smoker   . Smokeless tobacco: Never Used  . Alcohol Use: No   OB History   Grav Para Term Preterm Abortions TAB SAB Ect Mult Living                 Review of Systems  All other systems reviewed and are negative.      Allergies  Codeine  Home Medications   Current Outpatient Rx  Name  Route  Sig  Dispense  Refill  . amLODipine (NORVASC) 5 MG tablet   Oral   Take 5 mg by mouth at bedtime.          Marland Kitchen atenolol (TENORMIN) 50 MG tablet   Oral   Take 50 mg by mouth daily.          . citalopram (CELEXA) 40 MG tablet   Oral   Take 40 mg by mouth at bedtime.          . clonazePAM (KLONOPIN) 1 MG tablet   Oral   Take 1 mg  by mouth 2 (two) times daily as needed for anxiety (up to three times per day).          Marland Kitchen dextromethorphan (DELSYM) 30 MG/5ML liquid   Oral   Take 30 mg by mouth 2 (two) times daily as needed for cough.         . dextromethorphan-guaiFENesin (MUCINEX DM) 30-600 MG per 12 hr tablet   Oral   Take 2 tablets by mouth 2 (two) times daily as needed for cough.         . hyaluronate sodium (RADIAPLEXRX) GEL   Topical   Apply 1 application topically 2 (two) times daily.         Marland Kitchen levothyroxine (SYNTHROID, LEVOTHROID) 50 MCG tablet   Oral   Take 50 mcg by mouth daily before breakfast.          . naproxen sodium (ANAPROX) 220 MG tablet   Oral   Take 220 mg by mouth 2 (two) times daily as needed (pain).         . ranitidine (ZANTAC) 150 MG tablet   Oral   Take 150 mg by mouth 2 (two) times daily.          . simvastatin (ZOCOR) 40 MG tablet   Oral   Take 40 mg by mouth daily at 6 PM.          . azithromycin (ZITHROMAX) 250 MG tablet      Take 1 tablet daily until gone   4 each   0   . predniSONE (DELTASONE) 20 MG tablet      Take 3 tablets daily until gone   12 tablet   0    BP 126/80  Pulse 75  Temp(Src) 98.5 F (36.9 C) (Oral)  Resp 20  SpO2 98% Physical Exam  Nursing note and vitals reviewed. Constitutional:  She is oriented to person, place, and time. She appears well-developed and well-nourished. No distress.  HENT:  Head: Normocephalic and atraumatic.  Eyes: Pupils are equal, round, and reactive to light.  Neck: Normal range of motion.  Cardiovascular: Normal rate and intact distal pulses.   Pulmonary/Chest: Effort normal. No respiratory distress. She has wheezes.  Abdominal: Normal appearance. She exhibits no distension. There is no tenderness. There is no rebound.  Musculoskeletal: Normal range of motion.  Neurological: She is alert and oriented to person, place, and time. No cranial nerve deficit.  Skin: Skin is warm and dry. No rash noted.  Psychiatric: She has a normal mood and affect. Her behavior is normal.    ED Course  Procedures (including critical care time)  Medications  LORazepam (ATIVAN) injection 1 mg (1 mg Intravenous Given 11/15/13 2025)  albuterol (PROVENTIL) (2.5 MG/3ML) 0.083% nebulizer solution 5 mg (5 mg Nebulization Given 11/15/13 2213)  ipratropium (ATROVENT) nebulizer solution 0.5 mg (0.5 mg Nebulization Given 11/15/13 2213)  iohexol (OMNIPAQUE) 350 MG/ML injection 100 mL (100 mLs Intravenous Contrast Given 11/15/13 2156)  LORazepam (ATIVAN) injection 1 mg (1 mg Intravenous Given 11/15/13 2235)  iohexol (OMNIPAQUE) 300 MG/ML solution 100 mL (100 mLs Intravenous Contrast Given 11/15/13 2254)  azithromycin (ZITHROMAX) tablet 500 mg (500 mg Oral Given 11/15/13 2339)  predniSONE (DELTASONE) tablet 60 mg (60 mg Oral Given 11/15/13 2339)    Labs Review Labs Reviewed  CBC WITH DIFFERENTIAL - Abnormal; Notable for the following:    WBC 10.6 (*)    Platelets 144 (*)    Monocytes Absolute 1.2 (*)    All other components within  normal limits  BASIC METABOLIC PANEL - Abnormal; Notable for the following:    GFR calc non Af Amer 62 (*)    GFR calc Af Amer 72 (*)    All other components within normal limits  URINALYSIS, ROUTINE W REFLEX MICROSCOPIC   Imaging Review  CT  [facility]  IMPRESSION: 1. No acute findings in the thorax to account for the patient's symptoms. 2. No definite signs of metastatic disease in the lungs. 3. Postoperative changes of left breast lumpectomy, left axillary lymph node dissection and radiation therapy to the left breast, as above. 4. 1.3 cm indeterminate lesion in the anterior aspect of the spleen. This is favored to be benign, but warrants attention on routine followup imaging. 5. Cardiomegaly. 6. Atherosclerosis, including 3 vessel coronary artery disease. Please note that although the presence of coronary artery calcium documents the presence of coronary artery disease, the severity of this disease and any potential stenosis cannot be assessed on this non-gated CT examination. Assessment for potential risk factor modification, dietary therapy or pharmacologic therapy may be warranted, if clinically indicated. 7. Additional incidental findings, as above.    EKG Interpretation   Date/Time:  Monday November 15 2013 19:45:05 EDT Ventricular Rate:  64 PR Interval:  165 QRS Duration: 89 QT Interval:  483 QTC Calculation: 498 R Axis:   56 Text Interpretation:  Sinus rhythm Borderline repolarization abnormality  Borderline prolonged QT interval ED PHYSICIAN INTERPRETATION AVAILABLE IN  [facility] Confirmed by TEST, Record ([id number]) on 11/17/2013 7:00:31 AM      MDM   Final diagnoses:  Bronchitis        Dot Lanes, MD 11/19/13 1242

## 2013-11-15 NOTE — ED Notes (Signed)
IV team RN at bedside.  

## 2013-11-15 NOTE — ED Notes (Signed)
Patient back from CT dept CT scan unable to be completed r/t malfunctioning PIV site IV team RN paged Neb tx given, see MAR

## 2013-11-15 NOTE — ED Notes (Signed)
Patient shaking upper and lower extremities During this episode patient alert and oriented x 4 and able to have conversation with nursing staff Patient repeatedly stating that she has Klonopin in her purse

## 2013-11-15 NOTE — ED Notes (Signed)
MD at bedside. 

## 2013-11-15 NOTE — ED Notes (Signed)
Patient states that she takes "Klonopin to stop my seizures."

## 2013-11-15 NOTE — ED Notes (Signed)
Patient back from CT dept 

## 2013-11-15 NOTE — ED Notes (Signed)
Patient brought back to Res B due to "shaking" that occurred in triage Patient states that she has a hx of "seizures for several years, but it might be nerves."--patient states she isn't sure what it is Patient alert and oriented x 4 during assessment with clear speech--able to handle secretions without difficulty No post-ictal symptoms

## 2013-11-15 NOTE — ED Notes (Signed)
Patient is resting comfortably. 

## 2013-11-15 NOTE — Discharge Instructions (Signed)
Bronchitis °Bronchitis is swelling (inflammation) of the air tubes leading to your lungs (bronchi). This causes mucus and a cough. If the swelling gets bad, you may have trouble breathing. °HOME CARE  °· Rest. °· Drink enough fluids to keep your pee (urine) clear or pale yellow (unless you have a condition where you have to watch how much you drink). °· Only take medicine as told by your doctor. If you were given antibiotic medicines, finish them even if you start to feel better. °· Avoid smoke, irritating chemicals, and strong smells. These make the problem worse. Quit smoking if you smoke. This helps your lungs heal faster. °· Use a cool mist humidifier. Change the water in the humidifier every day. You can also sit in the bathroom with hot shower running for 5 10 minutes. Keep the door closed. °· See your health care provider as told. °· Wash your hands often. °GET HELP IF: °Your problems do not get better after 1 week. °GET HELP RIGHT AWAY IF:  °· Your fever gets worse. °· You have chills. °· Your chest hurts. °· Your problems breathing get worse. °· You have blood in your mucus. °· You pass out (faint). °· You feel lightheaded. °· You have a bad headache. °· You throw up (vomit) again and again. °MAKE SURE YOU: °· Understand these instructions. °· Will watch your condition. °· Will get help right away if you are not doing well or get worse. °Document Released: 01/29/2008 Document Revised: 06/02/2013 Document Reviewed: 04/06/2013 °[employer]® Patient Information ©2014 [employer]. ° °

## 2013-11-15 NOTE — ED Notes (Signed)
Patient having another episode of shaking of bilateral upper and lower extremities, which increases in severity when spoken to by nursing staff Patient behavior started when IV team RN placing IV As noted with previous episode, patient alert and oriented during and after the episode and able to hold coherent conversation with nursing staff NO post-ictal phase Patient medicated, see MAR After episode, patient states "It's my nerves. That's what it is. When I get nervous I have a seizure." Will make MD aware of above

## 2013-11-15 NOTE — ED Notes (Signed)
Pt states cough x 1 week. Breast CA. Hypertensive.

## 2013-11-16 ENCOUNTER — Ambulatory Visit
Admission: RE | Admit: 2013-11-16 | Discharge: 2013-11-16 | Disposition: A | Discharge status: Home / Self Care | Payer: Medicare Other | Source: Ambulatory Visit | Attending: Radiation Oncology | Admitting: Radiation Oncology

## 2013-11-16 ENCOUNTER — Encounter (INDEPENDENT_AMBULATORY_CARE_PROVIDER_SITE_OTHER): Payer: Medicare Other | Admitting: General Surgery

## 2013-11-16 ENCOUNTER — Telehealth: Payer: Self-pay | Admitting: Radiation Oncology

## 2013-11-16 ENCOUNTER — Ambulatory Visit: Payer: Medicare Other

## 2013-11-16 NOTE — Telephone Encounter (Signed)
Opened in error

## 2013-11-17 ENCOUNTER — Ambulatory Visit: Payer: Medicare Other

## 2013-11-17 ENCOUNTER — Ambulatory Visit: Admission: RE | Admit: 2013-11-17 | Payer: Medicare Other | Source: Ambulatory Visit

## 2013-11-17 ENCOUNTER — Ambulatory Visit
Admission: RE | Admit: 2013-11-17 | Discharge: 2013-11-17 | Disposition: A | Discharge status: Home / Self Care | Payer: Medicare Other | Source: Ambulatory Visit | Attending: Radiation Oncology | Admitting: Radiation Oncology

## 2013-11-17 VITALS — BP 162/103 | HR 55 | Temp 98.3°F | Resp 22

## 2013-11-17 DIAGNOSIS — C50412 Malignant neoplasm of upper-outer quadrant of left female breast: Secondary | ICD-10-CM

## 2013-11-17 NOTE — Progress Notes (Signed)
Weekly Management Note:  Site: Left breast Current Dose:  1600  cGy Projected Dose: 4680  cGy, followed by boost  Narrative: The patient is seen today for routine under treatment assessment. CBCT/MVCT images/port films were reviewed. The chart was reviewed.   I was called back to the [facility] for guidance whether not to proceed with free breathing tangential radiation therapy to her left breast. She's had bronchitis for the past few days, and she finds it difficult to hold still with her coughing. I decided to rest her incident moving ahead with treatment today.  Physical Examination:  Filed Vitals:   11/17/13 1641  BP: 162/103  Pulse: 55  Temp: 98.3 F (36.8 C)  Resp: 22  .  Weight:  . No change  Impression: We are holding her radiation therapy until she recovers from her severe bronchitis. She has been in contact with her nurse practitioner, Alvester Chou.  Plan: Continue radiation therapy as planned with her bronchitis improves.

## 2013-11-17 NOTE — Progress Notes (Signed)
Pt unable to complete radiation treatment due to coughing. Pt has productive cough with thick yellow sputum, slight sore throat. She states she called EMS [facility]. due to her cough, was given Prednisone, antibiotic which she is taking. She is taking Mucinex, OTC cough syrup. She also c/o HA. She denies other pain, symptoms. She states she was told in ED she has bronchitis.

## 2013-11-18 ENCOUNTER — Telehealth: Payer: Self-pay | Admitting: *Deleted

## 2013-11-18 ENCOUNTER — Ambulatory Visit: Payer: Medicare Other

## 2013-11-18 ENCOUNTER — Ambulatory Visit
Admission: RE | Admit: 2013-11-18 | Discharge: 2013-11-18 | Disposition: A | Discharge status: Home / Self Care | Payer: Medicare Other | Source: Ambulatory Visit | Attending: Radiation Oncology | Admitting: Radiation Oncology

## 2013-11-18 NOTE — Telephone Encounter (Signed)
Spoke w/pt who states, "I am no better today." She states she is still coughing, and her dr called home health. She states "someone is supposed to come see her today to help her with her breathing." She also states she was given a number to call over the weekend if she needs help. Informed [name] that this RN will call her tomorrow to check on her status again. Pt verbalized appreciation and understanding.

## 2013-11-18 NOTE — Progress Notes (Signed)
Per Dr Valere Dross, pt on treatment break today due to bronchitis. Notified Heather, RT, linac #3. This RN will call pt today to check on her status. Per Dr Valere Dross, pt may be on break until 11/22/13 if pt's bronchitis has not improved before that date.

## 2013-11-19 ENCOUNTER — Ambulatory Visit: Payer: Medicare Other

## 2013-11-19 NOTE — Telephone Encounter (Signed)
Spoke w/pt re: her status with her bronchitis. Pt states she received a visit yesterday from a home health service that "took her information". She states she did not receive any treatments. She is unclear in her information but states it is "someone she can call at night or on weekends if she needs help". Pt states she is taking Mucinex, cough syrup and has coughed less today than yesterday. She states that due to cold rainy weather she does not want to come for treatment today. She states she plans to resume treatments on 11/22/13 with hopes that she gets better over weekend. Informed pt will let RT on linac #3 know she is not coming for treatment today. Pt verbalized understanding.

## 2013-11-22 ENCOUNTER — Ambulatory Visit
Admission: RE | Admit: 2013-11-22 | Discharge: 2013-11-22 | Disposition: A | Discharge status: Home / Self Care | Payer: Medicare Other | Source: Ambulatory Visit | Attending: Radiation Oncology | Admitting: Radiation Oncology

## 2013-11-22 ENCOUNTER — Encounter: Payer: Self-pay | Admitting: Radiation Oncology

## 2013-11-22 ENCOUNTER — Ambulatory Visit: Payer: Medicare Other

## 2013-11-22 VITALS — BP 133/85 | HR 66 | Temp 98.2°F | Resp 20 | Wt 250.5 lb

## 2013-11-22 DIAGNOSIS — C50412 Malignant neoplasm of upper-outer quadrant of left female breast: Secondary | ICD-10-CM

## 2013-11-22 NOTE — Progress Notes (Signed)
   Weekly Management Note:  outpatient Current Dose:  18 Gy  Projected Dose: 46.8 Gy initial  Narrative:  The patient presents for routine under treatment assessment.  CBCT/MVCT images/Port film x-rays were reviewed.  The chart was checked. She is doing better in terms of her bronchitis, but has a rash at the L inframammary fold.  Home Health saw her today and her PCP is Rx'ing a "yeast Powder" for it.   Physical Findings:  weight is 250 lb 8 oz (113.626 kg). Her oral temperature is 98.2 F (36.8 C). Her blood pressure is 133/85 and her pulse is 66. Her respiration is 20.  NAD, erythematous rash at Left inframammary fold consistent with candidal infection of skin. Skin intact, moist.  Impression:  The patient is tolerating radiotherapy.  Plan:  Continue radiotherapy as planned. Will given pt nonadherent pads to apply to L inframammary fold.  Advised her to use anti-yeast powder liberally, as prescribed.  Will have nursing make sure pt fills Rx and add medication to her med list.  ________________________________   Eppie Gibson, M.D.

## 2013-11-22 NOTE — Progress Notes (Signed)
Pt in nursing prior to radiation treatment today because of a rash under her left breast. Pt has widespread red rash under left breast and same rash scattered under right breast. She states she has issues with yeast infections, has an unknown cream she applies. She states she had HH at her house this morning. She states that they were going to contact her PCP and have "yeast powder" called in to her pharmacy. Pt states she has been keeping this area clean and dry, but it has gotten worse over past 2 days. Pt's left breast slightly warm to touch; she states it is tender.  Pt's bronchitis has improved over the weekend. She completed her Prednisone, takes cough syrup prn.  Pt will see Dr Isidore Moos prior to treatment today.

## 2013-11-23 ENCOUNTER — Ambulatory Visit: Payer: Medicare Other

## 2013-11-23 ENCOUNTER — Ambulatory Visit
Admission: RE | Admit: 2013-11-23 | Discharge: 2013-11-23 | Disposition: A | Discharge status: Home / Self Care | Payer: Medicare Other | Source: Ambulatory Visit | Attending: Radiation Oncology | Admitting: Radiation Oncology

## 2013-11-24 ENCOUNTER — Ambulatory Visit: Payer: Medicare Other

## 2013-11-24 ENCOUNTER — Ambulatory Visit
Admission: RE | Admit: 2013-11-24 | Discharge: 2013-11-24 | Disposition: A | Discharge status: Home / Self Care | Payer: Medicare Other | Source: Ambulatory Visit | Attending: Radiation Oncology | Admitting: Radiation Oncology

## 2013-11-25 ENCOUNTER — Ambulatory Visit: Payer: Medicare Other

## 2013-11-25 ENCOUNTER — Ambulatory Visit
Admission: RE | Admit: 2013-11-25 | Discharge: 2013-11-25 | Disposition: A | Discharge status: Home / Self Care | Payer: Medicare Other | Source: Ambulatory Visit | Attending: Radiation Oncology | Admitting: Radiation Oncology

## 2013-11-26 ENCOUNTER — Ambulatory Visit: Payer: Medicare Other

## 2013-11-26 ENCOUNTER — Ambulatory Visit
Admission: RE | Admit: 2013-11-26 | Discharge: 2013-11-26 | Disposition: A | Discharge status: Home / Self Care | Payer: Medicare Other | Source: Ambulatory Visit | Attending: Radiation Oncology | Admitting: Radiation Oncology

## 2013-11-26 ENCOUNTER — Telehealth: Payer: Self-pay | Admitting: *Deleted

## 2013-11-26 ENCOUNTER — Encounter: Payer: Self-pay | Admitting: Radiation Oncology

## 2013-11-26 VITALS — BP 152/68 | HR 55 | Temp 97.5°F | Resp 20

## 2013-11-26 DIAGNOSIS — C50412 Malignant neoplasm of upper-outer quadrant of left female breast: Secondary | ICD-10-CM

## 2013-11-26 MED ORDER — MICONAZOLE NITRATE 2 % EX CREA: 1.0000 "application " | TOPICAL_CREAM | Freq: Three times a day (TID) | CUTANEOUS | Status: DC

## 2013-11-26 MED ORDER — HYDROCODONE-ACETAMINOPHEN 5-325 MG PO TABS: 1.0000 | ORAL_TABLET | ORAL | Status: DC | PRN

## 2013-11-26 NOTE — Progress Notes (Signed)
   Weekly Management Note:  outpatient Current Dose:  25.2 Gy  Projected Dose: 46.8 Gy  initial  Narrative:  The patient presents for routine under treatment assessment.  CBCT/MVCT images/Port film x-rays were reviewed.  The chart was checked. She was brought to nursing due to persistent rash/yeast under inframmary fold, using nystop powder rx strength, but it is not really helping. She also is using GOLD BOND powder. Reports  painful and itching.  Appetite good. In pain, asking for pain meds.  Physical Findings:  oral temperature is 97.5 F (36.4 C). Her blood pressure is 152/68 and her pulse is 55. Her respiration is 20.  NAD, Left inframammary fold - persistent yeast like rash. Some moist desquamation at inferior aspect of breast.  Impression:  The patient is tolerating radiotherapy. Yeast infection.  Plan:  Continue radiotherapy as planned. Rx'd Miconazole cream to use liberally TID.  Rx'd hydrocodone-acet pills for pain. Dr Valere Dross will reassess on 11-29-13. Patient instructed NOT to use Willette Brace, and to discontinue antifungal powder given by home health.  ________________________________   Eppie Gibson, M.D.

## 2013-11-26 NOTE — Telephone Encounter (Signed)
Mailed after appt letter to pt. 

## 2013-11-26 NOTE — Progress Notes (Signed)
Patient brought to nursing has been treated left breast 14/26 completd, rash/yeast under inframmary fold, using nystop powder rx strength, patient c/o not really helping, painful and itching and itching on nipple also, skkin is broken under inframmary fold,  Appetite good, drinking more water now stated, fatigued, asked for something for pain 4:34 PM

## 2013-11-29 ENCOUNTER — Ambulatory Visit
Admission: RE | Admit: 2013-11-29 | Discharge: 2013-11-29 | Disposition: A | Discharge status: Home / Self Care | Payer: Medicare Other | Source: Ambulatory Visit | Attending: Radiation Oncology | Admitting: Radiation Oncology

## 2013-11-29 ENCOUNTER — Ambulatory Visit: Payer: Medicare Other

## 2013-11-29 DIAGNOSIS — C50412 Malignant neoplasm of upper-outer quadrant of left female breast: Secondary | ICD-10-CM

## 2013-11-29 NOTE — Progress Notes (Signed)
Weekly Management Note:  Site: Left breast Current Dose:  2700  cGy Projected Dose: 4680  cGy followed by a left breast boost of 1400 cGy 7 sessions  Narrative: The patient is seen today for routine under treatment assessment. CBCT/MVCT images/port films were reviewed. The chart was reviewed.   She has discomfort along the left inframammary region. 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 She also reports some worsening of her cough and she is resuming Mucinex. She was on a short rest secondary to bronchitis with difficulty holding still.  Physical Examination: There were no vitals filed for this visit..  Weight:  . There is a fungal skin reaction along both inframammary breast folds. There is an area of excoriation along the inferior left breast. There is slight erythema along the remainder of the left breast.  Impression: Tolerating radiation therapy well, except for a worsening left inframammary fungal skin reaction. I instructed her to use miconazole powder along the inframammary regions of her left and right breasts. She has Radioplex gel to use elsewhere. I told she may use her hydrocodone prescription for cough.  Plan: Continue radiation therapy as planned.

## 2013-11-30 ENCOUNTER — Ambulatory Visit
Admission: RE | Admit: 2013-11-30 | Discharge: 2013-11-30 | Disposition: A | Discharge status: Home / Self Care | Payer: Medicare Other | Source: Ambulatory Visit | Attending: Radiation Oncology | Admitting: Radiation Oncology

## 2013-11-30 ENCOUNTER — Ambulatory Visit: Payer: Medicare Other

## 2013-12-01 ENCOUNTER — Ambulatory Visit: Payer: Medicare Other

## 2013-12-01 ENCOUNTER — Ambulatory Visit
Admission: RE | Admit: 2013-12-01 | Discharge: 2013-12-01 | Disposition: A | Discharge status: Home / Self Care | Payer: Medicare Other | Source: Ambulatory Visit | Attending: Radiation Oncology | Admitting: Radiation Oncology

## 2013-12-02 ENCOUNTER — Ambulatory Visit
Admission: RE | Admit: 2013-12-02 | Discharge: 2013-12-02 | Disposition: A | Discharge status: Home / Self Care | Payer: Medicare Other | Source: Ambulatory Visit | Attending: Radiation Oncology | Admitting: Radiation Oncology

## 2013-12-03 ENCOUNTER — Ambulatory Visit
Admission: RE | Admit: 2013-12-03 | Discharge: 2013-12-03 | Disposition: A | Discharge status: Home / Self Care | Payer: Medicare Other | Source: Ambulatory Visit | Attending: Radiation Oncology | Admitting: Radiation Oncology

## 2013-12-03 ENCOUNTER — Ambulatory Visit: Payer: Medicare Other

## 2013-12-06 ENCOUNTER — Ambulatory Visit: Payer: Medicare Other | Admitting: Radiation Oncology

## 2013-12-06 ENCOUNTER — Ambulatory Visit
Admission: RE | Admit: 2013-12-06 | Discharge: 2013-12-06 | Disposition: A | Discharge status: Home / Self Care | Payer: Medicare Other | Source: Ambulatory Visit | Attending: Radiation Oncology | Admitting: Radiation Oncology

## 2013-12-06 ENCOUNTER — Ambulatory Visit (INDEPENDENT_AMBULATORY_CARE_PROVIDER_SITE_OTHER): Payer: Medicare Other | Admitting: General Surgery

## 2013-12-06 ENCOUNTER — Encounter (INDEPENDENT_AMBULATORY_CARE_PROVIDER_SITE_OTHER): Payer: Self-pay | Admitting: General Surgery

## 2013-12-06 VITALS — BP 144/73 | HR 51 | Temp 97.9°F | Wt 249.1 lb

## 2013-12-06 VITALS — BP 146/100 | HR 60 | Resp 16 | Ht 63.0 in

## 2013-12-06 DIAGNOSIS — C50412 Malignant neoplasm of upper-outer quadrant of left female breast: Secondary | ICD-10-CM

## 2013-12-06 DIAGNOSIS — C50419 Malignant neoplasm of upper-outer quadrant of unspecified female breast: Secondary | ICD-10-CM

## 2013-12-06 NOTE — Patient Instructions (Signed)
Call radiation center if you have fever.  Continue to follow recs of Dr. Valere Dross.  Follow up with [place] in 6 months.

## 2013-12-06 NOTE — Progress Notes (Signed)
HISTORY: Pt is 2 months s/p left lumpectomy/SLN bx for breast cancer/dcis.  Margins are OK.  Pt has had significant issues with radiation.  She had bronchitis.  She also has had skin changes that are very painful.  Her nipple is tender.     EXAM: General:  Alert and oriented.   Incision:  Left breast has some lymphedema.  Seroma improved.  She has redness of entire breast with some breakdown in the inframammary crease.     PATHOLOGY: Diagnosis 1. Breast, lumpectomy, Left - INVASIVE DUCTAL CARCINOMA, SEE COMMENT. - INVASIVE TUMOR IS 0.2 MM FROM NEAREST MARGIN (POSTERIOR). - NEGATIVE FOR LYMPH VASCULAR INVASION. - DUCTAL CARCINOMA IN SITU. - IN SITU CARCINOMA IS 1 MM FROM NEAREST MARGIN (POSTERIOR). 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 - SEE TUMOR SYNOPTIC TEMPLATE BELOW. 2. Lymph node, sentinel, biopsy, Left #1 - ONE LYMPH NODE, NEGATIVE FOR TUMOR (0/1). 3. Lymph node, sentinel, biopsy, Left #2 - ONE LYMPH NODE, NEGATIVE FOR TUMOR (0/1).   ASSESSMENT AND PLAN:   Breast cancer of upper-outer quadrant of left female breast Pt advised to follow rad onc recs for creams/powders during radiation.  Follow up in 6 months.         Milus Height, MD Surgical Oncology, [facility]  Alvester Chou, NP Alvester Chou, NP

## 2013-12-06 NOTE — Assessment & Plan Note (Signed)
Pt advised to follow rad onc recs for creams/powders during radiation.  Follow up in 6 months.

## 2013-12-06 NOTE — Progress Notes (Signed)
Weekly Management Note:  Site: Left breast Current Dose:  3600  cGy Projected Dose: 4680  cGy followed by boost  Narrative: The patient is seen today for routine under treatment assessment. CBCT/MVCT images/port films were reviewed. The chart was reviewed.   She does report discomfort along her left inframammary fold. She applies miconazole powder to this region where she has a known fungal skin infection. She states that her bronchitis is improved but she still has an occasional cough.  Physical Examination:  Filed Vitals:   12/06/13 1649  BP: 144/73  Pulse: 51  Temp: 97.9 F (36.6 C)  .  Weight: 249 lb 1.6 oz (112.991 kg). There is erythema along the left breast with a fungal infection along the left inframammary region. She does have patchy dry desquamation but no areas of moist desquamation.  Impression: Tolerating radiation therapy well.  Plan: Continue radiation therapy as planned.

## 2013-12-06 NOTE — Progress Notes (Signed)
Patient here for weekly assessment of radiation of left breast.Completed 20 of 26 treatments.there has been some improvement in healing of skin of left mammary fold, no further oozing.Continues to apply miconazole powder.Nipple no longer hard.Generalized weakness and fatigue.

## 2013-12-07 ENCOUNTER — Ambulatory Visit
Admission: RE | Admit: 2013-12-07 | Discharge: 2013-12-07 | Disposition: A | Discharge status: Home / Self Care | Payer: Medicare Other | Source: Ambulatory Visit | Attending: Radiation Oncology | Admitting: Radiation Oncology

## 2013-12-08 ENCOUNTER — Ambulatory Visit
Admission: RE | Admit: 2013-12-08 | Discharge: 2013-12-08 | Disposition: A | Discharge status: Home / Self Care | Payer: Medicare Other | Source: Ambulatory Visit | Attending: Radiation Oncology | Admitting: Radiation Oncology

## 2013-12-09 ENCOUNTER — Ambulatory Visit
Admission: RE | Admit: 2013-12-09 | Discharge: 2013-12-09 | Disposition: A | Discharge status: Home / Self Care | Payer: Medicare Other | Source: Ambulatory Visit | Attending: Radiation Oncology | Admitting: Radiation Oncology

## 2013-12-10 ENCOUNTER — Ambulatory Visit
Admission: RE | Admit: 2013-12-10 | Discharge: 2013-12-10 | Disposition: A | Discharge status: Home / Self Care | Payer: Medicare Other | Source: Ambulatory Visit | Attending: Radiation Oncology | Admitting: Radiation Oncology

## 2013-12-13 ENCOUNTER — Ambulatory Visit
Admission: RE | Admit: 2013-12-13 | Discharge: 2013-12-13 | Disposition: A | Discharge status: Home / Self Care | Payer: Medicare Other | Source: Ambulatory Visit | Attending: Radiation Oncology | Admitting: Radiation Oncology

## 2013-12-13 ENCOUNTER — Encounter: Payer: Self-pay | Admitting: Radiation Oncology

## 2013-12-13 VITALS — BP 157/86 | HR 55 | Temp 98.0°F | Ht 63.0 in | Wt 249.7 lb

## 2013-12-13 DIAGNOSIS — C50412 Malignant neoplasm of upper-outer quadrant of left female breast: Secondary | ICD-10-CM

## 2013-12-13 NOTE — Progress Notes (Signed)
Complex simulation note: The patient completed virtual simulation for her left breast boost. She is setup to 3 field technique and 3 separate multileaf collimators were designed to conform the field. I prescribing 1400 cGy 7 sessions utilizing mixed 15 MV and 6 MV photons. I requesting continuation of daily AlignRT. I am prescribing her dose to the 100% isodose curve.

## 2013-12-13 NOTE — Progress Notes (Signed)
Weekly Management Note:  Site: Left breast Current Dose:  4500  cGy Projected Dose: 4680  cGy followed by 7 fraction boost  Narrative: The patient is seen today for routine under treatment assessment. CBCT/MVCT images/port films were reviewed. The chart was reviewed.   She still doing well but she does report a history of lower rectum stromal discomfort which is occasionally sharp right occasionally "achy-like". This may last a few minutes a few times during the day but has never at night. This is not associated with dyspnea and is not related to exertion. She thought this might be related to her radiation therapy. She's had this for at least a few weeks. Her bronchitis is improved but she still has an occasional cough. She continues with her miconazole powder and Radioplex gel.  Physical Examination:  Filed Vitals:   12/13/13 1621  BP: 157/86  Pulse: 55  Temp: 98 F (36.7 C)  .  Weight: 249 lb 11.2 oz (113.263 kg). There is moderate erythema with dry desquamation the skin along the left inframammary region. There is no moist desquamation. There is no palpable sternal discomfort. Lungs are clear. Heart: Regular rate and rhythm.  Impression: Tolerating radiation therapy well, however, I am unsure as to the etiology of her lobe retrosternal discomfort which may or may not be musculoskeletal as result of her recent bronchitis/coughing. I doubt that her pain is cardiac. I doubt that her discomfort is related to her radiation therapy. However, I told her to contact her nurse practitioner, Alvester Chou for further evaluation.  Plan: Continue radiation therapy as planned.

## 2013-12-13 NOTE — Progress Notes (Signed)
[name] has had 25 fractions to her left breast and midsternal area.  She is having pain in her left breast that she is rating at a 5/10.  She also has pain in her neck and lower back.  She reports a frequent cough from bronchitis that she had last month.  She reports fatigue.  The skin on her left breast is red with a slight rash underneath her breast.  She also has some scratches on her left side.  She reports the skin on her left breast is itchy.  She is using radiaplex, miconazole cream and nystatin powder under her left breast.

## 2013-12-14 ENCOUNTER — Ambulatory Visit
Admission: RE | Admit: 2013-12-14 | Discharge: 2013-12-14 | Disposition: A | Discharge status: Home / Self Care | Payer: Medicare Other | Source: Ambulatory Visit | Attending: Radiation Oncology | Admitting: Radiation Oncology

## 2013-12-15 ENCOUNTER — Ambulatory Visit
Admission: RE | Admit: 2013-12-15 | Discharge: 2013-12-15 | Disposition: A | Discharge status: Home / Self Care | Payer: Medicare Other | Source: Ambulatory Visit | Attending: Radiation Oncology | Admitting: Radiation Oncology

## 2013-12-15 ENCOUNTER — Encounter: Payer: Self-pay | Admitting: Radiation Oncology

## 2013-12-15 ENCOUNTER — Ambulatory Visit: Payer: Medicare Other

## 2013-12-15 NOTE — Progress Notes (Signed)
Simulation verification note: The patient underwent simulation verification today for her left breast boost. Her isocenter is in good position and the multileaf collimators contoured the treatment volume appropriately.

## 2013-12-16 ENCOUNTER — Ambulatory Visit: Payer: Medicare Other

## 2013-12-16 ENCOUNTER — Encounter (HOSPITAL_COMMUNITY): Payer: Self-pay | Admitting: Emergency Medicine

## 2013-12-16 ENCOUNTER — Ambulatory Visit
Admission: RE | Admit: 2013-12-16 | Discharge: 2013-12-16 | Disposition: A | Discharge status: Home / Self Care | Payer: Medicare Other | Source: Ambulatory Visit | Attending: Radiation Oncology | Admitting: Radiation Oncology

## 2013-12-16 ENCOUNTER — Emergency Department (HOSPITAL_COMMUNITY)
Admission: EM | Admit: 2013-12-16 | Discharge: 2013-12-16 | Disposition: A | Discharge status: Home / Self Care | Payer: Medicare Other | Attending: Emergency Medicine | Admitting: Emergency Medicine

## 2013-12-16 DIAGNOSIS — M129 Arthropathy, unspecified: Secondary | ICD-10-CM | POA: Insufficient documentation

## 2013-12-16 DIAGNOSIS — Z862 Personal history of diseases of the blood and blood-forming organs and certain disorders involving the immune mechanism: Secondary | ICD-10-CM | POA: Insufficient documentation

## 2013-12-16 DIAGNOSIS — F3289 Other specified depressive episodes: Secondary | ICD-10-CM | POA: Insufficient documentation

## 2013-12-16 DIAGNOSIS — I10 Essential (primary) hypertension: Secondary | ICD-10-CM | POA: Insufficient documentation

## 2013-12-16 DIAGNOSIS — C50919 Malignant neoplasm of unspecified site of unspecified female breast: Secondary | ICD-10-CM | POA: Insufficient documentation

## 2013-12-16 DIAGNOSIS — Z79899 Other long term (current) drug therapy: Secondary | ICD-10-CM | POA: Insufficient documentation

## 2013-12-16 DIAGNOSIS — Z8673 Personal history of transient ischemic attack (TIA), and cerebral infarction without residual deficits: Secondary | ICD-10-CM | POA: Insufficient documentation

## 2013-12-16 DIAGNOSIS — F411 Generalized anxiety disorder: Secondary | ICD-10-CM | POA: Insufficient documentation

## 2013-12-16 DIAGNOSIS — G40909 Epilepsy, unspecified, not intractable, without status epilepticus: Secondary | ICD-10-CM | POA: Insufficient documentation

## 2013-12-16 DIAGNOSIS — H9190 Unspecified hearing loss, unspecified ear: Secondary | ICD-10-CM | POA: Insufficient documentation

## 2013-12-16 DIAGNOSIS — F329 Major depressive disorder, single episode, unspecified: Secondary | ICD-10-CM | POA: Insufficient documentation

## 2013-12-16 DIAGNOSIS — K219 Gastro-esophageal reflux disease without esophagitis: Secondary | ICD-10-CM | POA: Insufficient documentation

## 2013-12-16 DIAGNOSIS — R251 Tremor, unspecified: Secondary | ICD-10-CM

## 2013-12-16 LAB — CBC WITH DIFFERENTIAL/PLATELET
Basophils Absolute: 0 10*3/uL (ref 0.0–0.1)
Basophils Relative: 0 % (ref 0–1)
Eosinophils Absolute: 0.2 10*3/uL (ref 0.0–0.7)
Eosinophils Relative: 4 % (ref 0–5)
HCT: 44.4 % (ref 36.0–46.0)
HEMOGLOBIN: 15 g/dL (ref 12.0–15.0)
Lymphocytes Relative: 18 % (ref 12–46)
Lymphs Abs: 0.9 10*3/uL (ref 0.7–4.0)
MCH: 32.2 pg (ref 26.0–34.0)
MCHC: 33.8 g/dL (ref 30.0–36.0)
MCV: 95.3 fL (ref 78.0–100.0)
MONOS PCT: 12 % (ref 3–12)
Monocytes Absolute: 0.6 10*3/uL (ref 0.1–1.0)
NEUTROS ABS: 3.4 10*3/uL (ref 1.7–7.7)
NEUTROS PCT: 66 % (ref 43–77)
PLATELETS: 133 10*3/uL — AB (ref 150–400)
RBC: 4.66 MIL/uL (ref 3.87–5.11)
RDW: 14.4 % (ref 11.5–15.5)
WBC: 5.2 10*3/uL (ref 4.0–10.5)

## 2013-12-16 LAB — BASIC METABOLIC PANEL
BUN: 22 mg/dL (ref 6–23)
CHLORIDE: 101 meq/L (ref 96–112)
CO2: 30 mEq/L (ref 19–32)
Calcium: 9.1 mg/dL (ref 8.4–10.5)
Creatinine, Ser: 1.05 mg/dL (ref 0.50–1.10)
GFR calc Af Amer: 62 mL/min — ABNORMAL LOW (ref >90)
GFR calc non Af Amer: 53 mL/min — ABNORMAL LOW (ref >90)
Glucose, Bld: 117 mg/dL — ABNORMAL HIGH (ref 70–99)
POTASSIUM: 4 meq/L (ref 3.7–5.3)
SODIUM: 142 meq/L (ref 137–147)

## 2013-12-16 LAB — URINALYSIS, ROUTINE W REFLEX MICROSCOPIC
Bilirubin Urine: NEGATIVE
Glucose, UA: NEGATIVE mg/dL
Hgb urine dipstick: NEGATIVE
Ketones, ur: NEGATIVE mg/dL
LEUKOCYTES UA: NEGATIVE
NITRITE: NEGATIVE
PH: 6.5 (ref 5.0–8.0)
Protein, ur: NEGATIVE mg/dL
Specific Gravity, Urine: 1.008 (ref 1.005–1.030)
UROBILINOGEN UA: 0.2 mg/dL (ref 0.0–1.0)

## 2013-12-16 LAB — CBG MONITORING, ED: Glucose-Capillary: 112 mg/dL — ABNORMAL HIGH (ref 70–99)

## 2013-12-16 MED ORDER — LORAZEPAM 2 MG/ML IJ SOLN
1.0000 mg | Freq: Once | INTRAMUSCULAR | Status: AC
  Administered 2013-12-16: 1 mg via INTRAVENOUS
  Filled 2013-12-16: qty 1

## 2013-12-16 NOTE — ED Notes (Addendum)
Pt from cancer center with 7 witnessed seizures. Upon arrival pt actively seizing starting at 1606 lasting 30 seconds. Pt alert and oriented at present time, answering all questions appropriately.   Pt reports neurologist states "not seizures, they are from stress."

## 2013-12-16 NOTE — ED Provider Notes (Signed)
CSN: [id number]     Arrival date & time 12/16/13  1611 History   First MD Initiated Contact with Patient 12/16/13 1617     Chief Complaint  Patient presents with  . Seizures     (Consider location/radiation/quality/duration/timing/severity/associated sxs/prior Treatment) HPI 68 year old female presents from the [facility] with seizures/shaking. She's had about 6 of these episodes. She remains awake during these episodes. History taken from both the patient and the nursing staff brought her. She is getting radiation for breast cancer on her left breast. She had surgery to remove this mass about 2 months ago. She states she gets these shaking spells, which she's had for years, when she is stressed or tired. She recently got back from a long trip and has been under some stress recently. She denies any new headaches, vomiting, blurry vision, or weakness. No fevers. She denies she received irradiation today. She took one of her Klonopin by mouth but is still having some of these episodes. She states she's not lose consciousness during these. She states she seen a neurologist before they told her it was stress related. She has been having redness to her left breast from radiation but no infection. She does have chronic bladder incontinence but does not have any dysuria. She states she chronically has tremors that are not worse currently.  Past Medical History  Diagnosis Date  . Tremor   . Bowel incontinence   . Bladder incontinence   . High blood pressure   . Anemia   . Depression   . Arthritis   . Cancer     breast cancer  . GERD (gastroesophageal reflux disease)   . Full dentures   . HOH (hard of hearing)   . Sleep apnea     has a cpap-5 hr  . Stroke 1980  . Complication of anesthesia     hard to wake up  . Anxiety   . Shortness of breath   . Chronic diarrhea   . Breast cancer 08/16/13    left, 1 o'clock  . Seizures     no meds   Past Surgical History  Procedure Laterality  Date  . Total hip arthroplasty  2005    rt total hip  . Abdominal hysterectomy    . Ovarian cysts    . Back surgery  2005    lumbar fusion  . Colonoscopy    . Multiple tooth extractions    . Breast lumpectomy with needle localization and axillary sentinel lymph node bx Left 09/29/2013    Procedure: BREAST LUMPECTOMY WITH NEEDLE LOCALIZATION AND AXILLARY SENTINEL LYMPH NODE BX;  Surgeon: [name], MD;  Location: [facility];  Service: General;  Laterality: Left;  clean wound class   Family History  Problem Relation Age of Onset  . Ataxia Neg Hx   . Chorea Neg Hx   . Dementia Neg Hx   . Mental retardation Neg Hx   . Migraines Neg Hx   . Multiple sclerosis Neg Hx   . Neurofibromatosis Neg Hx   . Neuropathy Neg Hx   . Parkinsonism Neg Hx   . Seizures Neg Hx   . Lung cancer Brother   . Stroke Mother    History  Substance Use Topics  . Smoking status: Never Smoker   . Smokeless tobacco: Never Used  . Alcohol Use: No   OB History   Grav Para Term Preterm Abortions TAB SAB Ect Mult Living  Review of Systems  Constitutional: Negative for fever.  Eyes: Negative for visual disturbance.  Gastrointestinal: Negative for vomiting and abdominal pain.  Neurological: Positive for tremors and seizures. Negative for weakness and headaches.  All other systems reviewed and are negative.     Allergies  Codeine  Home Medications   Prior to Admission medications   Medication Sig Start Date End Date Taking? Authorizing Provider  amLODipine (NORVASC) 5 MG tablet Take 5 mg by mouth at bedtime.  06/01/13   Historical Provider, MD  atenolol (TENORMIN) 50 MG tablet Take 50 mg by mouth daily.  06/19/13   Historical Provider, MD  citalopram (CELEXA) 40 MG tablet Take 40 mg by mouth at bedtime.  07/25/13   Historical Provider, MD  clonazePAM (KLONOPIN) 1 MG tablet Take 1 mg by mouth 2 (two) times daily as needed for anxiety (up to three times per day).  07/13/13    Historical Provider, MD  dextromethorphan (DELSYM) 30 MG/5ML liquid Take 30 mg by mouth 2 (two) times daily as needed for cough.    Historical Provider, MD  dextromethorphan-guaiFENesin (MUCINEX DM) 30-600 MG per 12 hr tablet Take 2 tablets by mouth 2 (two) times daily as needed for cough.    Historical Provider, MD  hyaluronate sodium (RADIAPLEXRX) GEL Apply 1 application topically 2 (two) times daily.    Historical Provider, MD  HYDROcodone-acetaminophen (NORCO/VICODIN) 5-325 MG per tablet Take 1 tablet by mouth every 4 (four) hours as needed for moderate pain. Take with food. Stop if nausea or vomiting occur. 11/26/13   Eppie Gibson, MD  levothyroxine (SYNTHROID, LEVOTHROID) 50 MCG tablet Take 50 mcg by mouth daily before breakfast.  06/28/13   Historical Provider, MD  miconazole (MICATIN) 2 % cream Apply 1 application topically 3 (three) times daily. Apply to fold under breast. Use liberally. 11/26/13   Eppie Gibson, MD  naproxen sodium (ANAPROX) 220 MG tablet Take 220 mg by mouth 2 (two) times daily as needed (pain).    Historical Provider, MD  nystatin (MYCOSTATIN/NYSTOP) 100000 UNIT/GM POWD Apply topically 2 (two) times daily. 11/23/13   Historical Provider, MD  ranitidine (ZANTAC) 150 MG tablet Take 150 mg by mouth 2 (two) times daily.  07/10/13   Historical Provider, MD  simvastatin (ZOCOR) 40 MG tablet Take 40 mg by mouth daily at 6 PM.  07/15/13   Historical Provider, MD   Temp(Src) 98 F (36.7 C) (Oral)  Resp 18  SpO2 96% Physical Exam  Nursing note and vitals reviewed. Constitutional: She is oriented to person, place, and time. She appears well-developed and well-nourished.  HENT:  Head: Normocephalic and atraumatic.  Right Ear: External ear normal.  Left Ear: External ear normal.  Nose: Nose normal.  Eyes: EOM are normal. Pupils are equal, round, and reactive to light. Right eye exhibits no discharge. Left eye exhibits no discharge.  Cardiovascular: Normal rate, regular rhythm and  normal heart sounds.   Pulmonary/Chest: Effort normal and breath sounds normal.  Abdominal: Soft. She exhibits no distension. There is no tenderness.  Neurological: She is alert and oriented to person, place, and time. She has normal strength. She displays tremor. No cranial nerve deficit or sensory deficit.  CN 2-12 grossly intact. Normal strength in all 4 extremities.  Skin: Skin is warm and dry. There is erythema.       ED Course  Procedures (including critical care time) Labs Review Labs Reviewed  CBC WITH DIFFERENTIAL - Abnormal; Notable for the following:    Platelets 133 (*)  All other components within normal limits  BASIC METABOLIC PANEL - Abnormal; Notable for the following:    Glucose, Bld 117 (*)    GFR calc non Af Amer 53 (*)    GFR calc Af Amer 62 (*)    All other components within normal limits  CBG MONITORING, ED - Abnormal; Notable for the following:    Glucose-Capillary 112 (*)    All other components within normal limits  URINALYSIS, ROUTINE W REFLEX MICROSCOPIC    Imaging Review No results found.   EKG Interpretation None      MDM   Final diagnoses:  Shaking    No actual seizure-like activity seen in the ED. The patient has a long-standing history of shaking like this and has been diagnosed with shaking without seizures. She did improve with IV Ativan here. Her symptoms are completely resolved. Her tremors are at her baseline. There no acute electrolyte abnormalities to suggest a cause for her shaking. As she appears well and her symptoms have resolved I feel she is stable for discharge. We'll have her followup with her PCP. No signs of head trauma, infection, or seizures. She has normal mental status at this time.    Ephraim Hamburger, MD 12/16/13 [phone]

## 2013-12-16 NOTE — ED Notes (Signed)
Bed: WA09 Expected date:  Expected time:  Means of arrival:  Comments: [place] Pt- focal seizures

## 2013-12-17 ENCOUNTER — Ambulatory Visit: Payer: Medicare Other

## 2013-12-17 ENCOUNTER — Telehealth: Payer: Self-pay | Admitting: Oncology

## 2013-12-17 NOTE — Telephone Encounter (Addendum)
Called [name] to see how she is feeling.  Latoiya said she is still shaking and has taken her klonopin this morning.  She said she also feels cold.  When asked if she has a temperature, she said that she doesn't think so.  She said she is going to try to come for radiation this afternoon if she is feeling better.  Asked if she has someone to drive her.  She said she does not.  She said she is OK to drive because she has a warning if she is going to start shaking.  Advised Dr. Lisbeth Renshaw of the situation and he said to check back with her to see how she is feeling and is concerned about her driving herself.  He said she can take a break today if needed.  Called Tabitha back and asked if she could get a ride.  Beuna said she would ask her brother and would call back.

## 2013-12-17 NOTE — Telephone Encounter (Signed)
Rai called and said she would not be coming to treatment today because her brother could not drive her.  She does not feel comfortable driving herself.  She again said she was cold and still shaking.  She said she dose not have a thermometer to take her temperature.  She said a nurse that is going to come out and check on her today.  Junious Silk on [address] that patient is not coming for treatment.

## 2013-12-20 ENCOUNTER — Ambulatory Visit
Admission: RE | Admit: 2013-12-20 | Discharge: 2013-12-20 | Disposition: A | Discharge status: Home / Self Care | Payer: Medicare Other | Source: Ambulatory Visit | Attending: Radiation Oncology | Admitting: Radiation Oncology

## 2013-12-20 ENCOUNTER — Encounter: Payer: Self-pay | Admitting: Radiation Oncology

## 2013-12-20 VITALS — BP 152/73 | HR 55 | Resp 16 | Wt 249.2 lb

## 2013-12-20 DIAGNOSIS — C50412 Malignant neoplasm of upper-outer quadrant of left female breast: Secondary | ICD-10-CM

## 2013-12-20 NOTE — Progress Notes (Signed)
Weekly Management Note:  Site: Left breast Current Dose:  5280  cGy Projected Dose: 6080  cGy  Narrative: The patient is seen today for routine under treatment assessment. CBCT/MVCT images/port films were reviewed. The chart was reviewed.   Her tumor she now well controlled. She continues with her Radioplex gel. She is no longer having lower retrosternal discomfort.  Physical Examination:  Filed Vitals:   12/20/13 1613  BP: 152/73  Pulse: 55  Resp: 16  .  Weight: 249 lb 3.2 oz (113.036 kg). There is moderate erythema along the left breast with dry desquamation along the inframammary region. There is no moist desquamation.  Impression: Tolerating radiation therapy well.  Plan: Continue radiation therapy as planned.

## 2013-12-20 NOTE — Progress Notes (Signed)
Red rash noted under right breast. Orange peel appearance of left breast with hyperpigmentation noted but, no desquamation. Left side at axilla with scratch marks. Dry skin of back noted. Applied aquaphor to back for patient. Patient reports using radiaplex as directed to left breast. Reports fatigue. Tremors well controlled today. Reports nocturia and incontinence keep her awake at night. Bilateral lower leg edema noted. Advised patient to keep legs elevated above the level of her heart when she is resting in her recliner at home. Reports occasional nausea and dizziness.

## 2013-12-21 ENCOUNTER — Ambulatory Visit
Admission: RE | Admit: 2013-12-21 | Discharge: 2013-12-21 | Disposition: A | Discharge status: Home / Self Care | Payer: Medicare Other | Source: Ambulatory Visit | Attending: Radiation Oncology | Admitting: Radiation Oncology

## 2013-12-22 ENCOUNTER — Ambulatory Visit
Admission: RE | Admit: 2013-12-22 | Discharge: 2013-12-22 | Disposition: A | Discharge status: Home / Self Care | Payer: Medicare Other | Source: Ambulatory Visit | Attending: Radiation Oncology | Admitting: Radiation Oncology

## 2013-12-23 ENCOUNTER — Encounter: Payer: Self-pay | Admitting: Radiation Oncology

## 2013-12-23 ENCOUNTER — Ambulatory Visit
Admission: RE | Admit: 2013-12-23 | Discharge: 2013-12-23 | Disposition: A | Discharge status: Home / Self Care | Payer: Medicare Other | Source: Ambulatory Visit | Attending: Radiation Oncology | Admitting: Radiation Oncology

## 2013-12-23 NOTE — Progress Notes (Signed)
Weekly Management Note:  Site: Left breast Current Dose:  5880  cGy Projected Dose: 6080  cGy including left breast boost  Narrative: The patient is seen today for routine under treatment assessment. CBCT/MVCT images/port films were reviewed. The chart was reviewed.   She is without new complaints today. She does have moderate discomfort along the inframammary region as expected. She uses Radioplex gel.  Physical Examination: There were no vitals filed for this visit..  Weight:  . There is moderate erythema along the entire left breast which is more severe along the left inframammary region with dry desquamation. No areas of moist desquamation.  Impression: Tolerating radiation therapy well. She'll finish her radiation therapy tomorrow.  Plan: Continue radiation therapy as planned. Followup visit one month following completion of radiation therapy.

## 2013-12-24 ENCOUNTER — Ambulatory Visit
Admission: RE | Admit: 2013-12-24 | Discharge: 2013-12-24 | Disposition: A | Discharge status: Home / Self Care | Payer: Medicare Other | Source: Ambulatory Visit | Attending: Radiation Oncology | Admitting: Radiation Oncology

## 2013-12-27 ENCOUNTER — Encounter: Payer: Self-pay | Admitting: Radiation Oncology

## 2013-12-27 NOTE — Progress Notes (Signed)
[facility] End of Treatment Note  Name:[name]  Date: 12/27/2013 MPN:[id number] DOB:06-09-1946   Status:outpatient    CC: Alvester Chou, NP  , Dr. [name]  REFERRING PHYSICIAN:  Dr. 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 She developed dry desquamation along the left inframammary region which was complicated by a fungal infection. She never developed a moist desquamation.                          PLAN: Routine followup in one month. Patient instructed to call if questions or worsening complaints in interim.

## 2014-01-13 ENCOUNTER — Other Ambulatory Visit: Payer: Medicare Other

## 2014-01-13 ENCOUNTER — Ambulatory Visit: Payer: Medicare Other | Admitting: Adult Health

## 2014-01-20 ENCOUNTER — Encounter: Payer: Self-pay | Admitting: *Deleted

## 2014-01-25 ENCOUNTER — Telehealth: Payer: Self-pay | Admitting: Oncology

## 2014-01-25 ENCOUNTER — Encounter: Payer: Self-pay | Admitting: Radiation Oncology

## 2014-01-25 ENCOUNTER — Ambulatory Visit
Admission: RE | Admit: 2014-01-25 | Discharge: 2014-01-25 | Disposition: A | Discharge status: Home / Self Care | Payer: Medicare Other | Source: Ambulatory Visit | Attending: Radiation Oncology | Admitting: Radiation Oncology

## 2014-01-25 VITALS — BP 163/87 | HR 56 | Temp 98.0°F | Resp 20 | Ht 63.0 in | Wt 250.1 lb

## 2014-01-25 DIAGNOSIS — C50412 Malignant neoplasm of upper-outer quadrant of left female breast: Secondary | ICD-10-CM

## 2014-01-25 NOTE — Telephone Encounter (Signed)
pt camed in to r/s missed appt...done...printed pt new sched

## 2014-01-25 NOTE — Progress Notes (Addendum)
Follow up left breast rad txs: 11/02/13-12/24/13, breast well healed still pink, skin intact, fatigued, still, sore at times, stated, appetite good, no c/o nausea, patient sleeping in her recliner, because of frequency voiding at night a lot, left arm at wrist swelling, patient carrys 8lb purse left handm, encouraged her to use the right hand to carry her purse 11:35 AM

## 2014-01-25 NOTE — Progress Notes (Signed)
CC: Alvester Chou, NP  Followup note:  Ms. Grall visits today approximately 1 month following completion of radiation therapy following conservative surgery in the management of her T1b N0 invasive ductal/DCIS of the left breast. She canceled her 5/21 appointment with medical oncology to discuss adjuvant antiestrogen therapy. She plans on rescheduling this appointment. She is without new complaints today. Her bronchitis has improved.  Physical examination: Alert and oriented. Filed Vitals:   01/25/14 1131  BP: 163/87  Pulse: 56  Temp: 98 F (36.7 C)  Resp: 20   Head and neck examination: Grossly unremarkable. Nodes: Without palpable supraclavicular, or axillary lymphadenopathy. Breasts: There is mild thickening of the left breast with residual hyperpigmentation. No dominant masses are appreciated. Right breast without masses or lesions. Extremities: Without edema.  Impression: Satisfactory progress. She should have a baseline left breast mammogram and a screening right breast mammogram later in December. She can go back to [place] where she had previous studies. She will make an appointment on her way out today to schedule a visit with medical oncology for discussion of antiestrogen therapy.  Plan: As above. Oncology followup through medical oncology and Dr. Barry Dienes.

## 2014-02-14 ENCOUNTER — Telehealth: Payer: Self-pay | Admitting: Adult Health

## 2014-02-14 NOTE — Telephone Encounter (Signed)
S/W PT ADVISED APPT 6/29 HAS TO BE MOVED INTO [facility]UL[facility] PER LC. PT ADVISED HER DAUGHTER JUST DIED AND SHE'S DEALING WITH MAKING ARRANGEMENTS SO ANYTIME IS OK. MADE PT'S APPT FOR 7/13 WITH DR. Edwyna Shell AND MAILED APPT CALENDAR.

## 2014-02-21 ENCOUNTER — Other Ambulatory Visit: Payer: Medicare Other

## 2014-02-21 ENCOUNTER — Ambulatory Visit: Payer: Medicare Other | Admitting: Adult Health

## 2014-03-06 ENCOUNTER — Other Ambulatory Visit: Payer: Self-pay | Admitting: Oncology

## 2014-03-06 DIAGNOSIS — C50412 Malignant neoplasm of upper-outer quadrant of left female breast: Secondary | ICD-10-CM

## 2014-03-07 ENCOUNTER — Emergency Department (HOSPITAL_COMMUNITY)
Admission: EM | Admit: 2014-03-07 | Discharge: 2014-03-08 | Disposition: A | Discharge status: Home / Self Care | Payer: Medicare Other | Attending: Emergency Medicine | Admitting: Emergency Medicine

## 2014-03-07 ENCOUNTER — Other Ambulatory Visit: Payer: Self-pay

## 2014-03-07 ENCOUNTER — Encounter (HOSPITAL_COMMUNITY): Payer: Self-pay | Admitting: Emergency Medicine

## 2014-03-07 ENCOUNTER — Ambulatory Visit (HOSPITAL_BASED_OUTPATIENT_CLINIC_OR_DEPARTMENT_OTHER): Payer: Self-pay | Admitting: Oncology

## 2014-03-07 ENCOUNTER — Encounter: Payer: Self-pay | Admitting: Specialist

## 2014-03-07 ENCOUNTER — Other Ambulatory Visit: Payer: Medicare Other

## 2014-03-07 VITALS — BP 145/69 | HR 69 | Temp 98.7°F

## 2014-03-07 DIAGNOSIS — F430 Acute stress reaction: Secondary | ICD-10-CM | POA: Insufficient documentation

## 2014-03-07 DIAGNOSIS — R45851 Suicidal ideations: Secondary | ICD-10-CM | POA: Diagnosis not present

## 2014-03-07 DIAGNOSIS — Z862 Personal history of diseases of the blood and blood-forming organs and certain disorders involving the immune mechanism: Secondary | ICD-10-CM | POA: Insufficient documentation

## 2014-03-07 DIAGNOSIS — F3289 Other specified depressive episodes: Secondary | ICD-10-CM | POA: Diagnosis not present

## 2014-03-07 DIAGNOSIS — F4321 Adjustment disorder with depressed mood: Secondary | ICD-10-CM | POA: Diagnosis present

## 2014-03-07 DIAGNOSIS — Z853 Personal history of malignant neoplasm of breast: Secondary | ICD-10-CM | POA: Insufficient documentation

## 2014-03-07 DIAGNOSIS — Z9981 Dependence on supplemental oxygen: Secondary | ICD-10-CM | POA: Insufficient documentation

## 2014-03-07 DIAGNOSIS — M129 Arthropathy, unspecified: Secondary | ICD-10-CM | POA: Diagnosis not present

## 2014-03-07 DIAGNOSIS — Z923 Personal history of irradiation: Secondary | ICD-10-CM | POA: Diagnosis not present

## 2014-03-07 DIAGNOSIS — R51 Headache: Secondary | ICD-10-CM | POA: Insufficient documentation

## 2014-03-07 DIAGNOSIS — Z87448 Personal history of other diseases of urinary system: Secondary | ICD-10-CM | POA: Insufficient documentation

## 2014-03-07 DIAGNOSIS — M542 Cervicalgia: Secondary | ICD-10-CM | POA: Insufficient documentation

## 2014-03-07 DIAGNOSIS — R443 Hallucinations, unspecified: Secondary | ICD-10-CM | POA: Insufficient documentation

## 2014-03-07 DIAGNOSIS — Z98811 Dental restoration status: Secondary | ICD-10-CM | POA: Diagnosis not present

## 2014-03-07 DIAGNOSIS — R079 Chest pain, unspecified: Secondary | ICD-10-CM | POA: Diagnosis not present

## 2014-03-07 DIAGNOSIS — F329 Major depressive disorder, single episode, unspecified: Secondary | ICD-10-CM | POA: Diagnosis present

## 2014-03-07 DIAGNOSIS — F32A Depression, unspecified: Secondary | ICD-10-CM | POA: Diagnosis present

## 2014-03-07 DIAGNOSIS — Z79899 Other long term (current) drug therapy: Secondary | ICD-10-CM | POA: Diagnosis not present

## 2014-03-07 DIAGNOSIS — H9190 Unspecified hearing loss, unspecified ear: Secondary | ICD-10-CM | POA: Insufficient documentation

## 2014-03-07 DIAGNOSIS — G4730 Sleep apnea, unspecified: Secondary | ICD-10-CM | POA: Diagnosis not present

## 2014-03-07 DIAGNOSIS — F411 Generalized anxiety disorder: Secondary | ICD-10-CM | POA: Diagnosis present

## 2014-03-07 DIAGNOSIS — K219 Gastro-esophageal reflux disease without esophagitis: Secondary | ICD-10-CM | POA: Diagnosis not present

## 2014-03-07 DIAGNOSIS — R251 Tremor, unspecified: Secondary | ICD-10-CM

## 2014-03-07 DIAGNOSIS — Z789 Other specified health status: Secondary | ICD-10-CM

## 2014-03-07 DIAGNOSIS — C50412 Malignant neoplasm of upper-outer quadrant of left female breast: Secondary | ICD-10-CM

## 2014-03-07 LAB — CBC WITH DIFFERENTIAL/PLATELET
BASOS ABS: 0 10*3/uL (ref 0.0–0.1)
Basophils Relative: 0 % (ref 0–1)
EOS PCT: 1 % (ref 0–5)
Eosinophils Absolute: 0.1 10*3/uL (ref 0.0–0.7)
HCT: 44.3 % (ref 36.0–46.0)
Hemoglobin: 15.1 g/dL — ABNORMAL HIGH (ref 12.0–15.0)
Lymphocytes Relative: 27 % (ref 12–46)
Lymphs Abs: 1.7 10*3/uL (ref 0.7–4.0)
MCH: 32.3 pg (ref 26.0–34.0)
MCHC: 34.1 g/dL (ref 30.0–36.0)
MCV: 94.9 fL (ref 78.0–100.0)
Monocytes Absolute: 0.5 10*3/uL (ref 0.1–1.0)
Monocytes Relative: 8 % (ref 3–12)
NEUTROS ABS: 3.9 10*3/uL (ref 1.7–7.7)
Neutrophils Relative %: 64 % (ref 43–77)
PLATELETS: 137 10*3/uL — AB (ref 150–400)
RBC: 4.67 MIL/uL (ref 3.87–5.11)
RDW: 13.7 % (ref 11.5–15.5)
WBC: 6.2 10*3/uL (ref 4.0–10.5)

## 2014-03-07 LAB — COMPREHENSIVE METABOLIC PANEL
ALBUMIN: 3.7 g/dL (ref 3.5–5.2)
ALT: 16 U/L (ref 0–35)
AST: 19 U/L (ref 0–37)
Alkaline Phosphatase: 52 U/L (ref 39–117)
Anion gap: 14 (ref 5–15)
BUN: 19 mg/dL (ref 6–23)
CALCIUM: 9.6 mg/dL (ref 8.4–10.5)
CHLORIDE: 99 meq/L (ref 96–112)
CO2: 27 meq/L (ref 19–32)
CREATININE: 1.03 mg/dL (ref 0.50–1.10)
GFR calc Af Amer: 63 mL/min — ABNORMAL LOW (ref >90)
GFR calc non Af Amer: 55 mL/min — ABNORMAL LOW (ref >90)
Glucose, Bld: 89 mg/dL (ref 70–99)
Potassium: 3.7 mEq/L (ref 3.7–5.3)
SODIUM: 140 meq/L (ref 137–147)
Total Bilirubin: 0.7 mg/dL (ref 0.3–1.2)
Total Protein: 7.3 g/dL (ref 6.0–8.3)

## 2014-03-07 LAB — URINALYSIS, ROUTINE W REFLEX MICROSCOPIC
Bilirubin Urine: NEGATIVE
Glucose, UA: NEGATIVE mg/dL
Hgb urine dipstick: NEGATIVE
Ketones, ur: NEGATIVE mg/dL
LEUKOCYTES UA: NEGATIVE
NITRITE: NEGATIVE
PH: 7 (ref 5.0–8.0)
Protein, ur: NEGATIVE mg/dL
SPECIFIC GRAVITY, URINE: 1.011 (ref 1.005–1.030)
Urobilinogen, UA: 0.2 mg/dL (ref 0.0–1.0)

## 2014-03-07 LAB — RAPID URINE DRUG SCREEN, HOSP PERFORMED
AMPHETAMINES: NOT DETECTED
BENZODIAZEPINES: NOT DETECTED
Barbiturates: NOT DETECTED
COCAINE: NOT DETECTED
OPIATES: NOT DETECTED
Tetrahydrocannabinol: NOT DETECTED

## 2014-03-07 LAB — TROPONIN I: Troponin I: <0.3 ng/mL (ref <0.30)

## 2014-03-07 LAB — CBG MONITORING, ED: Glucose-Capillary: 91 mg/dL (ref 70–99)

## 2014-03-07 MED ORDER — NITROGLYCERIN 0.4 MG SL SUBL
SUBLINGUAL_TABLET | SUBLINGUAL | Status: AC
  Filled 2014-03-07: qty 1

## 2014-03-07 MED ORDER — IBUPROFEN 200 MG PO TABS
400.0000 mg | ORAL_TABLET | Freq: Once | ORAL | Status: AC
  Administered 2014-03-07: 400 mg via ORAL
  Filled 2014-03-07: qty 2

## 2014-03-07 MED ORDER — LORAZEPAM 2 MG/ML IJ SOLN
1.0000 mg | Freq: Once | INTRAMUSCULAR | Status: AC
  Administered 2014-03-07: 1 mg via INTRAVENOUS
  Filled 2014-03-07: qty 1

## 2014-03-07 NOTE — BH Assessment (Signed)
[place] Assessment Progress Note      Spoke with Dr Regenia Skeeter regarding patient.  Pt has shaking episodes when stressed.  Sent over from cancer center (has breast cancer) due to shaking episode, suspected seizure-which Dr states is not a seizure, but anxiety.  Pt's daughter recently died making her depression worse.  Pt requested Dr to give her enough medication to put her to sleep so she could go to heaven.  Also said same to chaplain who visited her in ED.

## 2014-03-07 NOTE — ED Notes (Addendum)
Neytiri Asche (Daughter) number is [phone]. Please call when patient is moved or a change in patient status.   Daughter Eliyah Mcshea is taking patient's car which is parked in [place]. Daughter was walked to security desk by NT Kenvil.

## 2014-03-07 NOTE — ED Notes (Signed)
Bed: RESA Expected date:  Expected time:  Means of arrival:  Comments: PT from [facility]

## 2014-03-07 NOTE — Progress Notes (Signed)
WHILE PATIENT WAITING FOR MD VISIT, RAPID RESPONSE TEAM CALLED TO ASSIST IN APPARENT SEIZURE ACTIVITY IN PATIENT LOBBY. TAKEN TO ER BY RAPID RESPONSE.

## 2014-03-07 NOTE — Progress Notes (Signed)
Follow up on patient after rapid response event at [facility]. Per patient's request contacted daughter-in-law [name]. Son's name is Engineer, petroleum. Patient's daughter died a week ago. Patient has moments when she is very lucid; then she will mention her daughter and cry. Then she laughs and talks about "maybe I'll die, that would be good." She told me about her cancer. Compassionate support and presence; listening and comfort. Patient talked about going to church and said, "I really need God now." Prayed with her per her request.  Epifania Gore, PhD, [place]

## 2014-03-07 NOTE — ED Notes (Signed)
Tele-psych at bedside.
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
## 2014-03-07 NOTE — ED Notes (Signed)
Estill Bamberg from TTS called to report that patient will be admitted for inpatient psychiatric care at [facility]. No beds currently available.

## 2014-03-07 NOTE — ED Provider Notes (Signed)
CSN: [id number]     Arrival date & time 03/07/14  1359 History   First MD Initiated Contact with Patient 03/07/14 1416     Chief Complaint  Patient presents with  . Seizures  . Anxiety     (Consider location/radiation/quality/duration/timing/severity/associated sxs/prior Treatment) HPI 68 year old female presents with shaking from the [place]. Patient has history of breast cancer and has a long-standing history of shaking. She been told by a neurologist is her not seizures. This is been attributed to her anxiety and depression. She normally takes Klonopin for this. She was recently took Klonopin this morning. She been having more stress in her life as her daughter died suddenly a couple days ago and she was recently brought her ashes into her house yesterday. During the interview and asked about pain she endorses chest pain. When asked she states it started in the ED, but was gone after a few seconds. Has a headache and neck pain, typically gets these when her shaking spells flare. Patient is asking for me to give her enough medicine for her to "go to sleep". When asked to clarify she states she wants to go to heaven.  Past Medical History  Diagnosis Date  . Tremor   . Bowel incontinence   . Bladder incontinence   . High blood pressure   . Anemia   . Depression   . Arthritis   . Cancer     breast cancer  . GERD (gastroesophageal reflux disease)   . Full dentures   . HOH (hard of hearing)   . Sleep apnea     has a cpap-5 hr  . Stroke 1980  . Complication of anesthesia     hard to wake up  . Anxiety   . Shortness of breath   . Chronic diarrhea   . Breast cancer 08/16/13    left, 1 o'clock  . Seizures     no meds  . History of radiation therapy 11/02/13- 12/24/13    left breast 4680 cGy in 26 sessions, left breast boost 1400 cGy in 7 sessions   Past Surgical History  Procedure Laterality Date  . Total hip arthroplasty  2005    rt total hip  . Abdominal hysterectomy     . Ovarian cysts    . Back surgery  2005    lumbar fusion  . Colonoscopy    . Multiple tooth extractions    . Breast lumpectomy with needle localization and axillary sentinel lymph node bx Left 09/29/2013    Procedure: BREAST LUMPECTOMY WITH NEEDLE LOCALIZATION AND AXILLARY SENTINEL LYMPH NODE BX;  Surgeon: [name], MD;  Location: [facility];  Service: General;  Laterality: Left;  clean wound class   Family History  Problem Relation Age of Onset  . Ataxia Neg Hx   . Chorea Neg Hx   . Dementia Neg Hx   . Mental retardation Neg Hx   . Migraines Neg Hx   . Multiple sclerosis Neg Hx   . Neurofibromatosis Neg Hx   . Neuropathy Neg Hx   . Parkinsonism Neg Hx   . Seizures Neg Hx   . Lung cancer Brother   . Stroke Mother    History  Substance Use Topics  . Smoking status: Never Smoker   . Smokeless tobacco: Never Used  . Alcohol Use: No   OB History   Grav Para Term Preterm Abortions TAB SAB Ect Mult Living  Review of Systems  Cardiovascular: Positive for chest pain.  Gastrointestinal: Negative for vomiting and abdominal pain.  Musculoskeletal: Positive for neck pain.  Neurological: Positive for tremors and headaches. Negative for speech difficulty.  All other systems reviewed and are negative.     Allergies  Codeine  Home Medications   Prior to Admission medications   Medication Sig Start Date End Date Taking? Authorizing Provider  amLODipine (NORVASC) 5 MG tablet Take 5 mg by mouth at bedtime.  06/01/13   Historical Provider, MD  atenolol (TENORMIN) 50 MG tablet Take 50 mg by mouth daily.  06/19/13   Historical Provider, MD  citalopram (CELEXA) 40 MG tablet Take 40 mg by mouth at bedtime.  07/25/13   Historical Provider, MD  clonazePAM (KLONOPIN) 1 MG tablet Take 0.5 mg by mouth 3 (three) times daily as needed for anxiety (0.5mg  in the am and 1mg  noon and 1mg  at pm).  07/13/13   Historical Provider, MD  hyaluronate sodium (RADIAPLEXRX)  GEL Apply 1 application topically 2 (two) times daily.    Historical Provider, MD  levothyroxine (SYNTHROID, LEVOTHROID) 50 MCG tablet Take 50 mcg by mouth daily before breakfast.  06/28/13   Historical Provider, MD  naproxen sodium (ANAPROX) 220 MG tablet Take 220 mg by mouth 2 (two) times daily as needed (pain).    Historical Provider, MD  nystatin (MYCOSTATIN/NYSTOP) 100000 UNIT/GM POWD Apply topically 2 (two) times daily. 11/23/13   Historical Provider, MD  ranitidine (ZANTAC) 150 MG tablet Take 150 mg by mouth 2 (two) times daily.  07/10/13   Historical Provider, MD   BP 136/68  Pulse 60  Temp(Src) 98.2 F (36.8 C) (Oral)  Resp 24  SpO2 97% Physical Exam  Nursing note and vitals reviewed. Constitutional: She is oriented to person, place, and time. She appears well-developed and well-nourished.  HENT:  Head: Normocephalic and atraumatic.  Right Ear: External ear normal.  Left Ear: External ear normal.  Nose: Nose normal.  Eyes: Right eye exhibits no discharge. Left eye exhibits no discharge.  Cardiovascular: Normal rate, regular rhythm and normal heart sounds.   Pulmonary/Chest: Effort normal and breath sounds normal.  Abdominal: Soft. She exhibits no distension. There is no tenderness.  Neurological: She is alert and oriented to person, place, and time.  Mouth twitching that varies in severity. Difficult to speak when it is severe. Hands occasionally shake as well.  Skin: Skin is warm and dry.  Psychiatric: She exhibits a depressed mood. She expresses suicidal ideation.  Tearful    ED Course  Procedures (including critical care time) Labs Review Labs Reviewed  CBC WITH DIFFERENTIAL - Abnormal; Notable for the following:    Hemoglobin 15.1 (*)    Platelets 137 (*)    All other components within normal limits  COMPREHENSIVE METABOLIC PANEL - Abnormal; Notable for the following:    GFR calc non Af Amer 55 (*)    GFR calc Af Amer 63 (*)    All other components within normal  limits  TROPONIN I  URINALYSIS, ROUTINE W REFLEX MICROSCOPIC  URINE RAPID DRUG SCREEN (HOSP PERFORMED)  CBG MONITORING, ED    Imaging Review No results found.   EKG Interpretation   Date/Time:  Monday March 07 2014 14:18:23 EDT Ventricular Rate:  64 PR Interval:  182 QRS Duration: 91 QT Interval:  449 QTC Calculation: 463 R Axis:   4 Text Interpretation:  Sinus rhythm LVH with secondary repolarization  abnormality No significant change since last tracing Confirmed by Corey Caulfield  MD, Zay Yeargan [phone]) on 03/07/2014 4:49:17 PM      MDM   Final diagnoses:  Shaking  Depression    Patient is very depressed here and asking for me to give her enough medicine for her to die. Given ativan for both the shaking and depression. Her CP was a pressure like sensation for a few seconds, now resolved. She gets this often when she gets very depressed. EKG benign, troponin normal. Will get 2nd troponin in a few hours to r/o MI. TTS consulted. Care transferred with TTS dispo pending.    Ephraim Hamburger, MD 03/07/14 [phone]

## 2014-03-07 NOTE — ED Notes (Signed)
Pt, brought over from [place], c/o seizures x 2.  Pt reports "I've been seen by a neurologist and they tell me that it isn't seizures.  They said it's my nerves."  Sts she takes Klonopin. Hx of breast CA.

## 2014-03-07 NOTE — ED Notes (Signed)
Pt is laughing hysterically and then starts to cry. Pt appears to be very anxious and is tearful when discussing her recent passing of her daughter.

## 2014-03-08 ENCOUNTER — Encounter (HOSPITAL_COMMUNITY): Payer: Self-pay | Admitting: Registered Nurse

## 2014-03-08 DIAGNOSIS — F329 Major depressive disorder, single episode, unspecified: Secondary | ICD-10-CM | POA: Diagnosis present

## 2014-03-08 DIAGNOSIS — F3289 Other specified depressive episodes: Secondary | ICD-10-CM

## 2014-03-08 DIAGNOSIS — F4321 Adjustment disorder with depressed mood: Secondary | ICD-10-CM

## 2014-03-08 DIAGNOSIS — F32A Depression, unspecified: Secondary | ICD-10-CM | POA: Diagnosis present

## 2014-03-08 DIAGNOSIS — R443 Hallucinations, unspecified: Secondary | ICD-10-CM | POA: Diagnosis not present

## 2014-03-08 MED ORDER — FAMOTIDINE 20 MG PO TABS
10.0000 mg | ORAL_TABLET | Freq: Every day | ORAL | Status: DC
  Administered 2014-03-08: 10 mg via ORAL
  Filled 2014-03-08 (×2): qty 1

## 2014-03-08 MED ORDER — CITALOPRAM HYDROBROMIDE 40 MG PO TABS
40.0000 mg | ORAL_TABLET | Freq: Every day | ORAL | Status: DC
  Filled 2014-03-08: qty 1

## 2014-03-08 MED ORDER — NAPROXEN SODIUM 220 MG PO TABS: 220.0000 mg | ORAL_TABLET | Freq: Two times a day (BID) | ORAL | Status: DC | PRN

## 2014-03-08 MED ORDER — ATENOLOL 25 MG PO TABS
25.0000 mg | ORAL_TABLET | Freq: Every day | ORAL | Status: DC
  Administered 2014-03-08: 25 mg via ORAL
  Filled 2014-03-08: qty 1

## 2014-03-08 MED ORDER — LEVOTHYROXINE SODIUM 50 MCG PO TABS
50.0000 ug | ORAL_TABLET | Freq: Every day | ORAL | Status: DC
  Administered 2014-03-08: 50 ug via ORAL
  Filled 2014-03-08 (×2): qty 1

## 2014-03-08 MED ORDER — CLONAZEPAM 0.5 MG PO TABS
0.5000 mg | ORAL_TABLET | Freq: Three times a day (TID) | ORAL | Status: DC | PRN
  Administered 2014-03-08: 0.5 mg via ORAL
  Filled 2014-03-08 (×2): qty 1

## 2014-03-08 MED ORDER — NAPROXEN 500 MG PO TABS
250.0000 mg | ORAL_TABLET | Freq: Two times a day (BID) | ORAL | Status: DC | PRN
  Administered 2014-03-08: 250 mg via ORAL
  Filled 2014-03-08: qty 1

## 2014-03-08 MED ORDER — AMLODIPINE BESYLATE 10 MG PO TABS
10.0000 mg | ORAL_TABLET | Freq: Every day | ORAL | Status: DC
  Administered 2014-03-08: 10 mg via ORAL
  Filled 2014-03-08: qty 1

## 2014-03-08 NOTE — Consult Note (Signed)
[facility] Face-to-Face Psychiatry Consult   Reason for Consult:  Depression and Grief  Referring Physician:  EDP  [name] is an 68 y.o. female. 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 Tremor   . Bowel incontinence   . Bladder incontinence   . High blood pressure   . Anemia   . Depression   . Arthritis   . Cancer     breast cancer  . GERD (gastroesophageal reflux disease)   . Full dentures   . HOH (hard of hearing)   . Sleep apnea     has a cpap-5 hr  . Stroke 1980  . Complication of anesthesia     hard to wake up  . Anxiety   . Shortness of breath   . Chronic diarrhea   . Breast cancer 08/16/13    left, 1 o'clock  . Seizures     no meds  . History of radiation therapy 11/02/13- 12/24/13    left breast 4680 cGy in 26 sessions, left breast boost 1400 cGy in 7 sessions   AXIS IV:  other psychosocial or environmental problems AXIS V:  61-70 mild symptoms  Plan:  No evidence of imminent risk to self or others at present.   Patient does not meet criteria for psychiatric inpatient admission. Supportive therapy provided about ongoing stressors. Discussed crisis plan, support from social network, calling 911, coming to the Emergency Department, and calling Suicide Hotline.  Subjective:   [name] is a 68 y.o. female patient.  HPI:  Patient state "I came for an appointment yesterday and I couldn't stop shaking.  I don't know why I was shaking so bad yesterday; I took a 1/2 of Klonopin.  I have said stuff.  Yes sometimes I feel like I want to go to sleep and not wake up; but I don't want to kill myself; and I wouldn't kill my self. Yes sometimes I feel depressed and anxious.  My daughter died 03-01-2023; she was bipolar and always got her medicine mixed up and sometime she would take to much. I want to live; I  just feel so sad sometimes"  Patient denies suicidal/homicidal ideation, psychosis, and paranoia.   HPI Elements:   Location:  Depression. Quality:  Anxiety. Severity:  Grief. Timing:  2 weeks.  Past Psychiatric History: Past Medical History  Diagnosis Date  . Tremor   . Bowel incontinence   . Bladder incontinence   . High blood pressure   . Anemia   . Depression   . Arthritis   . Cancer     breast cancer  . GERD (gastroesophageal reflux disease)   . Full dentures   . HOH (hard of hearing)   . Sleep apnea     has a cpap-5 hr  . Stroke 1980  . Complication of anesthesia     hard to wake up  . Anxiety   . Shortness of breath   . Chronic diarrhea   . Breast cancer 08/16/13    left, 1 o'clock  . Seizures     no meds  . History of radiation therapy 11/02/13- 12/24/13    left breast 4680 cGy in 26 sessions, left breast boost 1400 cGy in 7 sessions    reports that she has never smoked. She has never used smokeless  tobacco. She reports that she does not drink alcohol or use illicit drugs. Family History  Problem Relation Age of Onset  . Ataxia Neg Hx   . Chorea Neg Hx   . Dementia Neg Hx   . Mental retardation Neg Hx   . Migraines Neg Hx   . Multiple sclerosis Neg Hx   . Neurofibromatosis Neg Hx   . Neuropathy Neg Hx   . Parkinsonism Neg Hx   . Seizures Neg Hx   . Lung cancer Brother   . Stroke Mother    Family History Substance Abuse: No Family Supports: Yes, List: (son and daughter in law) Living Arrangements: Alone Can pt return to current living arrangement?: Yes Abuse/Neglect [facility]) Physical Abuse: Denies Verbal Abuse: Denies Sexual Abuse: Denies Allergies:   Allergies  Allergen Reactions  . Codeine Nausea And Vomiting    ACT Assessment Complete:  Yes:    Educational Status    Risk to Self: Risk to self Suicidal Ideation: Yes-Currently Present Suicidal Intent: No Is patient at risk for suicide?: Yes Suicidal Plan?: No Access to Means: No What has  been your use of drugs/alcohol within the last 12 months?: denies How many times?: 0 Intentional Self Injurious Behavior: None Family Suicide History: Yes (daughter just commited suicide) Recent stressful life event(s): Other (Comment);Turmoil (Comment);Loss (Comment) (daughter overdosed, recent cancer diagnosis) Persecutory voices/beliefs?: No Depression: Yes Depression Symptoms: Despondent;Tearfulness;Isolating;Fatigue;Guilt;Loss of interest in usual pleasures;Feeling worthless/self pity;Feeling angry/irritable Substance abuse history and/or treatment for substance abuse?: No Suicide prevention information given to non-admitted patients: Not applicable  Risk to Others: Risk to Others Homicidal Ideation: No Thoughts of Harm to Others: No Current Homicidal Intent: No Current Homicidal Plan: No Access to Homicidal Means: No History of harm to others?: No Assessment of Violence: None Noted Does patient have access to weapons?: No Criminal Charges Pending?: No Does patient have a court date: No  Abuse: Abuse/Neglect Assessment (Assessment to be complete while patient is alone) Physical Abuse: Denies Verbal Abuse: Denies Sexual Abuse: Denies  Prior Inpatient Therapy: Prior Inpatient Therapy Prior Inpatient Therapy: Yes Prior Therapy Dates: unk Prior Therapy Facilty/Provider(s): [place]  Reason for Treatment: Depression  Prior Outpatient Therapy: Prior Outpatient Therapy Prior Outpatient Therapy: No  Additional Information: Additional Information 1:1 In Past 12 Months?: No CIRT Risk: No Elopement Risk: No Does patient have medical clearance?: Yes                  Objective: Blood pressure 128/64, pulse 57, temperature 97.5 F (36.4 C), temperature source Oral, resp. rate 14, SpO2 92.00%.There is no weight on file to calculate BMI. Results for orders placed during the hospital encounter of 03/07/14 (from the past 72 hour(s))  CBG MONITORING, ED     Status: None    Collection Time    03/07/14  2:02 PM      Result Value Ref Range   Glucose-Capillary 91  70 - 99 mg/dL  CBC WITH DIFFERENTIAL     Status: Abnormal   Collection Time    03/07/14  2:33 PM      Result Value Ref Range   WBC 6.2  4.0 - 10.5 K/uL   RBC 4.67  3.87 - 5.11 MIL/uL   Hemoglobin 15.1 (*) 12.0 - 15.0 g/dL   HCT 44.3  36.0 - 46.0 %   MCV 94.9  78.0 - 100.0 fL   MCH 32.3  26.0 - 34.0 pg   MCHC 34.1  30.0 - 36.0 g/dL   RDW 13.7  11.5 - 15.5 %   Platelets 137 (*) 150 - 400 K/uL   Neutrophils Relative % 64  43 - 77 %   Neutro Abs 3.9  1.7 - 7.7 K/uL   Lymphocytes Relative 27  12 - 46 %   Lymphs Abs 1.7  0.7 - 4.0 K/uL   Monocytes Relative 8  3 - 12 %   Monocytes Absolute 0.5  0.1 - 1.0 K/uL   Eosinophils Relative 1  0 - 5 %   Eosinophils Absolute 0.1  0.0 - 0.7 K/uL   Basophils Relative 0  0 - 1 %   Basophils Absolute 0.0  0.0 - 0.1 K/uL  COMPREHENSIVE METABOLIC PANEL     Status: Abnormal   Collection Time    03/07/14  2:33 PM      Result Value Ref Range   Sodium 140  137 - 147 mEq/L   Potassium 3.7  3.7 - 5.3 mEq/L   Chloride 99  96 - 112 mEq/L   CO2 27  19 - 32 mEq/L   Glucose, Bld 89  70 - 99 mg/dL   BUN 19  6 - 23 mg/dL   Creatinine, Ser 1.03  0.50 - 1.10 mg/dL   Calcium 9.6  8.4 - 10.5 mg/dL   Total Protein 7.3  6.0 - 8.3 g/dL   Albumin 3.7  3.5 - 5.2 g/dL   AST 19  0 - 37 U/L   ALT 16  0 - 35 U/L   Alkaline Phosphatase 52  39 - 117 U/L   Total Bilirubin 0.7  0.3 - 1.2 mg/dL   GFR calc non Af Amer 55 (*) >90 mL/min   GFR calc Af Amer 63 (*) >90 mL/min   Comment: (NOTE)     The eGFR has been calculated using the CKD EPI equation.     This calculation has not been validated in all clinical situations.     eGFR's persistently <90 mL/min signify possible Chronic Kidney     Disease.   Anion gap 14  5 - 15  TROPONIN I     Status: None   Collection Time    03/07/14  2:33 PM      Result Value Ref Range   Troponin I <0.30  <0.30 ng/mL   Comment:            Due to  the release kinetics of cTnI,     a negative result within the first hours     of the onset of symptoms does not rule out     myocardial infarction with certainty.     If myocardial infarction is still suspected,     repeat the test at appropriate intervals.  URINALYSIS, ROUTINE W REFLEX MICROSCOPIC     Status: None   Collection Time    03/07/14  2:36 PM      Result Value Ref Range   Color, Urine YELLOW  YELLOW   APPearance CLEAR  CLEAR   Specific Gravity, Urine 1.011  1.005 - 1.030   pH 7.0  5.0 - 8.0   Glucose, UA NEGATIVE  NEGATIVE mg/dL   Hgb urine dipstick NEGATIVE  NEGATIVE   Bilirubin Urine NEGATIVE  NEGATIVE   Ketones, ur NEGATIVE  NEGATIVE mg/dL   Protein, ur NEGATIVE  NEGATIVE mg/dL   Urobilinogen, UA 0.2  0.0 - 1.0 mg/dL   Nitrite NEGATIVE  NEGATIVE   Leukocytes, UA NEGATIVE  NEGATIVE   Comment: MICROSCOPIC NOT DONE ON URINES WITH NEGATIVE  PROTEIN, BLOOD, LEUKOCYTES, NITRITE, OR GLUCOSE <1000 mg/dL.  URINE RAPID DRUG SCREEN (HOSP PERFORMED)     Status: None   Collection Time    03/07/14  2:36 PM      Result Value Ref Range   Opiates NONE DETECTED  NONE DETECTED   Cocaine NONE DETECTED  NONE DETECTED   Benzodiazepines NONE DETECTED  NONE DETECTED   Amphetamines NONE DETECTED  NONE DETECTED   Tetrahydrocannabinol NONE DETECTED  NONE DETECTED   Barbiturates NONE DETECTED  NONE DETECTED   Comment:            DRUG SCREEN FOR MEDICAL PURPOSES     ONLY.  IF CONFIRMATION IS NEEDED     FOR ANY PURPOSE, NOTIFY LAB     WITHIN 5 DAYS.                LOWEST DETECTABLE LIMITS     FOR URINE DRUG SCREEN     Drug Class       Cutoff (ng/mL)     Amphetamine      1000     Barbiturate      200     Benzodiazepine   358     Tricyclics       251     Opiates          300     Cocaine          300     THC              50  TROPONIN I     Status: None   Collection Time    03/07/14  5:43 PM      Result Value Ref Range   Troponin I <0.30  <0.30 ng/mL   Comment:            Due to  the release kinetics of cTnI,     a negative result within the first hours     of the onset of symptoms does not rule out     myocardial infarction with certainty.     If myocardial infarction is still suspected,     repeat the test at appropriate intervals.   Labs are reviewed see above values.  Medications reviewed and no changes made.   Current Facility-Administered Medications  Medication Dose Route Frequency Provider Last Rate Last Dose  . amLODipine (NORVASC) tablet 10 mg  10 mg Oral Daily Jasper Riling. Pickering, MD      . atenolol (TENORMIN) tablet 25 mg  25 mg Oral Daily Nathan R. Pickering, MD      . citalopram (CELEXA) tablet 40 mg  40 mg Oral QHS Nathan R. Pickering, MD      . clonazePAM Bobbye [place]) tablet 0.5 mg  0.5 mg Oral TID PRN Jasper Riling. Pickering, MD      . famotidine (PEPCID) tablet 10 mg  10 mg Oral Daily Jasper Riling. Pickering, MD   10 mg at 03/08/14 1521  . levothyroxine (SYNTHROID, LEVOTHROID) tablet 50 mcg  50 mcg Oral QAC breakfast Jasper Riling. Pickering, MD      . naproxen (NAPROSYN) tablet 250 mg  250 mg Oral BID PRN Jasper Riling. Alvino Chapel, MD       Current Outpatient Prescriptions  Medication Sig Dispense Refill  . amLODipine (NORVASC) 5 MG tablet Take 10 mg by mouth daily.       Marland Kitchen atenolol (TENORMIN) 50 MG tablet Take 25 mg by mouth daily.       Marland Kitchen  citalopram (CELEXA) 40 MG tablet Take 40 mg by mouth at bedtime.       . clonazePAM (KLONOPIN) 1 MG tablet Take 0.5 mg by mouth 3 (three) times daily as needed for anxiety (0.72m in the am and 154mnoon and 52m17mt pm).       . lMarland Kitchenvothyroxine (SYNTHROID, LEVOTHROID) 50 MCG tablet Take 50 mcg by mouth daily before breakfast.       . naproxen sodium (ANAPROX) 220 MG tablet Take 220 mg by mouth 2 (two) times daily as needed (pain).      . nystatin (MYCOSTATIN/NYSTOP) 100000 UNIT/GM POWD Apply topically 2 (two) times daily.      . ranitidine (ZANTAC) 150 MG tablet Take 150 mg by mouth 2 (two) times daily.       . hyaluronate sodium  (RADIAPLEXRX) GEL Apply 1 application topically 2 (two) times daily.        Psychiatric Specialty Exam:     Blood pressure 128/64, pulse 57, temperature 97.5 F (36.4 C), temperature source Oral, resp. rate 14, SpO2 92.00%.There is no weight on file to calculate BMI.  General Appearance: Casual  Eye Contact::  Good  Speech:  Clear and Coherent and Normal Rate  Volume:  Normal  Mood:  Depressed  Affect:  Congruent  Thought Process:  Circumstantial and Goal Directed  Orientation:  Full (Time, Place, and Person)  Thought Content:  Rumination  Suicidal Thoughts:  No  Homicidal Thoughts:  No  Memory:  Immediate;   Good Recent;   Good Remote;   Good  Judgement:  Fair  Insight:  Present  Psychomotor Activity:  Decreased  Concentration:  Fair  Recall:  Good  Fund of Knowledge:Good  Language: Good  Akathisia:  No  Handed:  Right  AIMS (if indicated):     Assets:  Communication Skills Desire for Improvement Social Support  Sleep:      Musculoskeletal: Strength & Muscle Tone: within normal limits Gait & Station: normal Patient leans: N/A  Treatment Plan Summary: Discharge home.  TTS/SW to set patient up with out patient services for therapy/medication management  Rankin, Shuvon, FNP-BC 03/08/2014 4:01 PM  Patient is seen face to face for psychiatric evaluation along with physician extender, case discussed after clinical rounds and formulated appropriate treatment plan. Reviewed the information documented and agree with the treatment plan.  Arien Morine,JANARDHAHA R. 03/10/2014 9:52 AM

## 2014-03-08 NOTE — Progress Notes (Addendum)
CSW called and spoke with Rosann Auerbach with [facility] who confirms that the patient had an appointment for grief counseling last week and call back to reschedule.  CSW informed the patient of the information to call to make the new appointment and transportation will be provided by her family.    CSW spoke with Dr. Alvester Chou [phone] reports the patient was just seen on Friday 03/04/14 and her appointments are monthly.    Chesley Noon, MSW, [place], 03/08/2014 Evening Clinical Social Worker [phone]

## 2014-03-08 NOTE — Discharge Instructions (Signed)
Depression, Adult Depression is feeling sad, low, down in the dumps, blue, gloomy, or empty. In general, there are two kinds of depression:  Normal sadness or grief. This can happen after something upsetting. It often goes away on its own within 2 weeks. After losing a loved one (bereavement), normal sadness and grief may last longer than two weeks. It usually gets better with time.  Clinical depression. This kind lasts longer than normal sadness or grief. It keeps you from doing the things you normally do in life. It is often hard to function at home, work, or at school. It may affect your relationships with others. Treatment is often needed. GET HELP RIGHT AWAY IF:  You have thoughts about hurting yourself or others.  You lose touch with reality (psychotic symptoms). You may:  See or hear things that are not real.  Have untrue beliefs about your life or people around you.  Your medicine is giving you problems. MAKE SURE YOU:  Understand these instructions.  Will watch your condition.  Will get help right away if you are not doing well or get worse. Document Released: 09/14/2010 Document Revised: 05/06/2012 Document Reviewed: 12/12/2011 [facility] Patient Information 2015 [place], [place]. This information is not intended to replace advice given to you by your health care provider. Make sure you discuss any questions you have with your health care provider.

## 2014-03-08 NOTE — [facility] Suicide Risk Assessment (Cosign Needed)
Suicide Risk Assessment  Discharge Assessment     Demographic Factors:  Female  Total Time spent with patient: 30 minutes Psychiatric Specialty Exam:      Blood pressure 128/64, pulse 57, temperature 97.5 F (36.4 C), temperature source Oral, resp. rate 14, SpO2 92.00%.There is no weight on file to calculate BMI.   General Appearance: Casual   Eye Contact:: Good   Speech: Clear and Coherent and Normal Rate   Volume: Normal   Mood: Depressed   Affect: Congruent   Thought Process: Circumstantial and Goal Directed   Orientation: Full (Time, Place, and Person)   Thought Content: Rumination   Suicidal Thoughts: No   Homicidal Thoughts: No   Memory: Immediate; Good  Recent; Good  Remote; Good   Judgement: Fair   Insight: Present   Psychomotor Activity: Decreased   Concentration: Fair   Recall: Good   Fund of Knowledge:Good   Language: Good   Akathisia: No   Handed: Right   AIMS (if indicated):   Assets: Communication Skills  Desire for Improvement  Social Support   Sleep:   Musculoskeletal:  Strength & Muscle Tone: within normal limits  Gait & Station: normal  Patient leans: N/A     Mental Status Per Nursing Assessment::   On Admission:     Current Mental Status by Physician: Patient denies suicidal/homicidal ideation, psychosis, and paranoia  Loss Factors: Death of daughter 2 weeks ago  Historical Factors: NA  Risk Reduction Factors:   NA  Continued Clinical Symptoms:  Medical Diagnoses and Treatments/Surgeries  Cognitive Features That Contribute To Risk:  None noted    Suicide Risk:  Minimal: No identifiable suicidal ideation.  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  Tremor    .  Bowel incontinence    .  Bladder incontinence    .  High blood pressure    .  Anemia    .  Depression    .  Arthritis    .  Cancer      breast cancer   .  GERD (gastroesophageal reflux disease)    .  Full dentures    .  HOH (hard of hearing)    .  Sleep apnea      has a cpap-5 hr   .  Stroke  1980   .  Complication of anesthesia      hard to wake up   .  Anxiety    .  Shortness of breath    .  Chronic diarrhea    .  Breast cancer  08/16/13     left, 1 o'clock   .  Seizures      no meds   .  History of radiation therapy  11/02/13- 12/24/13     left breast 4680 cGy in 26 sessions, left breast boost 1400 cGy in 7 sessions    AXIS IV: other psychosocial or environmental problems  AXIS V: 61-70 mild symptoms    Plan Of Care/Follow-up recommendations:  Activity:  Reuse usual activity Diet:  Resume usual diet  Is patient on multiple antipsychotic therapies at discharge:  No   Has Patient had three or more failed trials of  antipsychotic monotherapy by history:  No  Recommended Plan for Multiple Antipsychotic Therapies: NA    Rankin, Shuvon, FNP-BC 03/08/2014, 4:17 PM

## 2014-03-08 NOTE — ED Notes (Signed)
Psychiatrist at bedside

## 2014-03-08 NOTE — ED Notes (Signed)
EDP Pickering made aware of need for review of pt's medications

## 2014-05-08 ENCOUNTER — Encounter: Payer: Self-pay | Admitting: Oncology

## 2014-06-15 ENCOUNTER — Telehealth: Payer: Self-pay | Admitting: Oncology

## 2014-06-15 NOTE — Telephone Encounter (Signed)
message left on vm today by Alvester Chou, NP with [facility] who was upset re pt falling through the cracks.Per [name] pt has not had an appointment in our office for some time. Per [name] pt should be on tamoxifen, should have had mammos and f/u care. per [name] none of this has happened and her office and pt has been trying to reach [place] to set up an appt. per [name] she has ordered a mammo for pt which should new spots and she is calling to find out who should be following pt and when she can be seen. last office note from 03/07/14 printed and taken to desk nurse. I requested that desk nurse or [facility] f/u with Alvester Chou, NP.   Former [place] pt to [employer]. Pt was moved from [facility] 02/21/14 to [facility] 03/07/14. Pt showed for appt and was sent to ED. No further communication on patient after 03/07/14 and no documented interaction between office and pt or office and [facility].   Returned [name]'s call but was not able to reach her [phone]). lmonvm that [facility] or desk nurse would be back in contact w/her. [name] was identified by first and last name on vm.   Alvester Chou, NP [phone]

## 2014-06-15 NOTE — Telephone Encounter (Signed)
Medical Oncology  Patient previously seen at this office by Dr Marcy Panning for early stage breast cancer.  Patient admitted with acute problem on 03-07-14 on day that she was to have been seen at [facility], that visit not rescheduled. Admission may have been to [facility], as no discharge report is found in this EMR. No other more recent information that I can locate in this EMR.  Patient's PCP Dr Alvester Chou Fernand Parkins [phone]) contacted scheduler at this office earlier today requesting appointment. I learned of this after office hours and LM with answering service at Dr Sedalia Muta office now, as I am glad to speak with physician if needed.    Will ask coordinator for breast clinic to assist with scheduling when our office opens tomorrow, likely most appropriate with one of the medical oncologists specializing in breast.  L.Matther Labell, MD

## 2014-06-16 ENCOUNTER — Telehealth: Payer: Self-pay | Admitting: *Deleted

## 2014-06-16 NOTE — Telephone Encounter (Signed)
Received request from Dr. Marko Plume to call the pt and get her a f/u appt scheduled w/ a MD.  Called and left a message for the pt to return my call so I can schedule her.

## 2014-06-17 ENCOUNTER — Other Ambulatory Visit (INDEPENDENT_AMBULATORY_CARE_PROVIDER_SITE_OTHER): Payer: Self-pay | Admitting: General Surgery

## 2014-06-17 DIAGNOSIS — C50412 Malignant neoplasm of upper-outer quadrant of left female breast: Secondary | ICD-10-CM

## 2014-06-17 DIAGNOSIS — Z853 Personal history of malignant neoplasm of breast: Secondary | ICD-10-CM

## 2014-06-20 ENCOUNTER — Telehealth: Payer: Self-pay | Admitting: *Deleted

## 2014-06-20 NOTE — Telephone Encounter (Signed)
Called pt about f/u appt and she is fine seeing Dr. Lindi Adie. Scheduled and confirmed 06/21/14 appt w/ her.  Emailed Dr. Marko Plume to make her aware.

## 2014-06-21 ENCOUNTER — Telehealth: Payer: Self-pay

## 2014-06-21 ENCOUNTER — Ambulatory Visit (HOSPITAL_BASED_OUTPATIENT_CLINIC_OR_DEPARTMENT_OTHER): Payer: Medicare Other | Admitting: Hematology and Oncology

## 2014-06-21 ENCOUNTER — Ambulatory Visit: Payer: Medicare Other | Attending: General Surgery | Admitting: Physical Therapy

## 2014-06-21 VITALS — BP 129/76 | HR 60 | Temp 98.4°F | Resp 19 | Ht 63.0 in | Wt 246.7 lb

## 2014-06-21 DIAGNOSIS — Z170 Estrogen receptor positive status [ER+]: Secondary | ICD-10-CM

## 2014-06-21 DIAGNOSIS — C50412 Malignant neoplasm of upper-outer quadrant of left female breast: Secondary | ICD-10-CM

## 2014-06-21 MED ORDER — NYSTATIN 100000 UNIT/GM EX CREA: 1.0000 "application " | TOPICAL_CREAM | Freq: Two times a day (BID) | CUTANEOUS | Status: DC

## 2014-06-21 NOTE — Assessment & Plan Note (Signed)
Left breast invasive ductal carcinoma T1, N0, M0 stage IA diagnosed December 2014 status post left lumpectomy 09/29/2013 0.9 cm low grade invasive ductal carcinoma ER/PR positive HER-2 negative Ki-67 5% status post radiation therapy. Patient refused antiestrogen therapy  I discussed the pros and cons of antiestrogen therapy with aromatase inhibitors. Patient is very concerned about the side effects and what to do to her health. She'll not like to go on any antiestrogen therapy. We will continue her annual surveillance with mammograms. She'll be set up for a mammogram in December 2016. Today's breast exam did not reveal any abnormalities.  Left axilla candida: She will need nystatin powder.

## 2014-06-21 NOTE — Progress Notes (Signed)
Patient Care Team: Alvester Chou, NP as PCP - General (Nurse Practitioner) Alvester Chou, NP (Nurse Practitioner)  DIAGNOSIS: Breast cancer of upper-outer quadrant of left female breast   Primary site: Breast (Left)   Staging method: AJCC 7th Edition   Clinical: Stage IA (T1b, N0, cM0)   Summary: Stage IA (T1b, N0, cM0)   Clinical comments: Staged at breast conference 09/01/13.   SUMMARY OF ONCOLOGIC HISTORY: PRIOR THERAPY:  1. screening mammogram on 08/09/2013 at [facility] she was felt to have a mass at 1:00 along the left breast suggestive of malignancy. Ultrasound showed a 0.5 x 0.9 cm mass within the upper-outer quadrant. An ultrasound-guided biopsy on 08/16/2013 was diagnostic for invasive ductal/DCIS. The invasive carcinoma was felt to be grade 1. This was ER/PR positive and HER-2/neu negative. Breast MR on 08/31/2013 showed biopsy changes along the upper-outer quadrant of the left breast with a 7 mm nodular area of enhancement. She had a right-sided breast biopsy at 5:00 which was low suspicion for malignancy. This was benign  2. 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 status post radiation therapy and refused antiestrogen therapy  CHIEF COMPLIANT: Followup of breast cancer  INTERVAL HISTORY: [name] is a 68 year old Caucasian with above-mentioned history of left breast cancer treated with lumpectomy and radiation. She is here after long gap and followup. She had radiation therapy and did not come back to initiate antiestrogen therapy. She is concerned about side effects of antiestrogen therapy. She complains of a soreness in the left axilla which has been redness and discomfort for the past couple of months.  REVIEW OF SYSTEMS:   Constitutional: Denies fevers, chills or abnormal weight loss Eyes: Denies blurriness of  vision Ears, nose, mouth, throat, and face: Denies mucositis or sore throat Respiratory: Denies cough, dyspnea or wheezes Cardiovascular: Denies palpitation, chest discomfort or lower extremity swelling Gastrointestinal:  Denies nausea, heartburn or change in bowel habits Skin: Denies abnormal skin rashes Lymphatics: Denies new lymphadenopathy or easy bruising Neurological:Denies numbness, tingling or new weaknesses Behavioral/Psych: Mood is stable, no new changes  Breast: Left axilla redness and soreness All other systems were reviewed with the patient and are negative.  I have reviewed the past medical history, past surgical history, social history and family history with the patient and they are unchanged from previous note.  ALLERGIES:  is allergic to codeine.  MEDICATIONS:  Current Outpatient Prescriptions  Medication Sig Dispense Refill  . amLODipine (NORVASC) 5 MG tablet Take 10 mg by mouth daily.       Marland Kitchen atenolol (TENORMIN) 50 MG tablet Take 25 mg by mouth daily.       . citalopram (CELEXA) 40 MG tablet Take 40 mg by mouth at bedtime.       . clonazePAM (KLONOPIN) 1 MG tablet Take 0.5 mg by mouth 3 (three) times daily as needed for anxiety (0.37m in the am and 178mnoon and 30m26mt pm).       . lMarland Kitchenvothyroxine (SYNTHROID, LEVOTHROID) 50 MCG tablet Take 50 mcg by mouth daily before breakfast.       . naproxen sodium (ANAPROX) 220 MG tablet Take 220 mg by mouth 2 (two) times daily as needed (pain).      . ranitidine (ZANTAC) 150 MG tablet Take 150 mg by mouth 2 (two) times daily.       .Marland Kitchen  hyaluronate sodium (RADIAPLEXRX) GEL Apply 1 application topically 2 (two) times daily.      Marland Kitchen nystatin (MYCOSTATIN/NYSTOP) 100000 UNIT/GM POWD Apply topically 2 (two) times daily.      Marland Kitchen nystatin cream (MYCOSTATIN) Apply 1 application topically 2 (two) times daily.  30 g  0   No current facility-administered medications for this visit.    PHYSICAL EXAMINATION: ECOG PERFORMANCE STATUS: 2 -  Symptomatic, <50% confined to bed  Filed Vitals:   06/21/14 1451  BP: 129/76  Pulse: 60  Temp: 98.4 F (36.9 C)  Resp: 19   Filed Weights   06/21/14 1451  Weight: 246 lb 11.2 oz (111.902 kg)    GENERAL:alert, no distress and comfortable SKIN: skin color, texture, turgor are normal, no rashes or significant lesions EYES: normal, Conjunctiva are pink and non-injected, sclera clear OROPHARYNX:no exudate, no erythema and lips, buccal mucosa, and tongue normal  NECK: supple, thyroid normal size, non-tender, without nodularity LYMPH:  no palpable lymphadenopathy in the cervical, axillary or inguinal LUNGS: clear to auscultation and percussion with normal breathing effort HEART: regular rate & rhythm and no murmurs and no lower extremity edema ABDOMEN:abdomen soft, non-tender and normal bowel sounds Musculoskeletal:no cyanosis of digits and no clubbing  NEURO: alert & oriented x 3 with fluent speech, no focal motor/sensory deficits BREAST: No palpable masses or nodules in either right or left breasts. No palpable axillary supraclavicular or infraclavicular adenopathy no breast tenderness or nipple discharge. Left axilla redness with Candida   LABORATORY DATA:  I have reviewed the data as listed   Chemistry      Component Value Date/Time   NA 140 03/07/2014 1433   NA 144 09/01/2013 1221   K 3.7 03/07/2014 1433   K 4.2 09/01/2013 1221   CL 99 03/07/2014 1433   CO2 27 03/07/2014 1433   CO2 30* 09/01/2013 1221   BUN 19 03/07/2014 1433   BUN 18.8 09/01/2013 1221   CREATININE 1.03 03/07/2014 1433   CREATININE 1.3* 09/01/2013 1221      Component Value Date/Time   CALCIUM 9.6 03/07/2014 1433   CALCIUM 9.2 09/01/2013 1221   ALKPHOS 52 03/07/2014 1433   ALKPHOS 48 09/01/2013 1221   AST 19 03/07/2014 1433   AST 17 09/01/2013 1221   ALT 16 03/07/2014 1433   ALT 16 09/01/2013 1221   BILITOT 0.7 03/07/2014 1433   BILITOT 0.78 09/01/2013 1221       Lab Results  Component Value Date   WBC 6.2 03/07/2014    HGB 15.1* 03/07/2014   HCT 44.3 03/07/2014   MCV 94.9 03/07/2014   PLT 137* 03/07/2014   NEUTROABS 3.9 03/07/2014     RADIOGRAPHIC STUDIES: I have personally reviewed the radiology reports and agreed with their findings. No results found.   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 Patient refused antiestrogen therapy  I discussed the pros and cons of antiestrogen therapy with aromatase inhibitors. Patient is very concerned about the side effects and what to do to her health. She'll not like to go on any antiestrogen therapy. We will continue her annual surveillance with mammograms. She'll be set up for a mammogram in December 2016. Today's breast exam did not reveal any abnormalities.  Left axilla candida: She will need  nystatin powder.   Orders Placed This Encounter  Procedures  . MM Digital Diagnostic Bilat    Standing Status: Future     Number of Occurrences:      Standing Expiration Date: 06/21/2015    Order Specific Question:  Reason for Exam (SYMPTOM  OR DIAGNOSIS REQUIRED)    Answer:  left breast cancer annual follow up    Order Specific Question:  Preferred imaging location?    Answer:  External     Comments:  [facility]   The patient has a good understanding of the overall plan. she agrees with it. She will call with any problems that may develop before her next visit here.  I spent 20 minutes counseling the patient face to face. The total time spent in the appointment was 25 minutes and more than 50% was on counseling and review of test results    Rulon Eisenmenger, MD 06/21/2014 3:39 PM

## 2014-06-21 NOTE — Telephone Encounter (Signed)
charting error

## 2014-06-22 ENCOUNTER — Telehealth: Payer: Self-pay | Admitting: Hematology and Oncology

## 2014-06-22 NOTE — Telephone Encounter (Signed)
lvm for pt regarding to Dec and April appts.Marland KitchenMarland KitchenMarland Kitchen

## 2014-06-23 ENCOUNTER — Telehealth: Payer: Self-pay | Admitting: Hematology and Oncology

## 2014-06-23 NOTE — Telephone Encounter (Signed)
LM with appt d/t for April 2016 & mammo Appt Dec. 15 @11am .

## 2014-07-06 ENCOUNTER — Ambulatory Visit: Payer: Medicare Other | Attending: General Surgery | Admitting: Physical Therapy

## 2014-08-18 ENCOUNTER — Encounter: Payer: Self-pay | Admitting: *Deleted

## 2014-08-18 NOTE — Progress Notes (Signed)
Received mammogram report from [facility] sent to scan.

## 2014-09-19 ENCOUNTER — Encounter: Payer: Self-pay | Admitting: Hematology and Oncology

## 2014-09-20 ENCOUNTER — Telehealth: Payer: Self-pay | Admitting: Hematology and Oncology

## 2014-09-20 NOTE — Telephone Encounter (Signed)
due to call day 4/27 appt moved to 4/28. left message for pt and mailed schedule.

## 2014-10-05 ENCOUNTER — Emergency Department (HOSPITAL_COMMUNITY)
Admission: EM | Admit: 2014-10-05 | Discharge: 2014-10-05 | Disposition: A | Discharge status: Home / Self Care | Payer: 59 | Attending: Emergency Medicine | Admitting: Emergency Medicine

## 2014-10-05 ENCOUNTER — Emergency Department (HOSPITAL_COMMUNITY): Payer: 59

## 2014-10-05 DIAGNOSIS — R569 Unspecified convulsions: Secondary | ICD-10-CM | POA: Diagnosis not present

## 2014-10-05 DIAGNOSIS — R064 Hyperventilation: Secondary | ICD-10-CM | POA: Diagnosis not present

## 2014-10-05 DIAGNOSIS — Z853 Personal history of malignant neoplasm of breast: Secondary | ICD-10-CM | POA: Insufficient documentation

## 2014-10-05 DIAGNOSIS — F445 Conversion disorder with seizures or convulsions: Secondary | ICD-10-CM

## 2014-10-05 DIAGNOSIS — Z98811 Dental restoration status: Secondary | ICD-10-CM | POA: Insufficient documentation

## 2014-10-05 DIAGNOSIS — Z8673 Personal history of transient ischemic attack (TIA), and cerebral infarction without residual deficits: Secondary | ICD-10-CM | POA: Diagnosis not present

## 2014-10-05 DIAGNOSIS — K219 Gastro-esophageal reflux disease without esophagitis: Secondary | ICD-10-CM | POA: Diagnosis not present

## 2014-10-05 DIAGNOSIS — Z9981 Dependence on supplemental oxygen: Secondary | ICD-10-CM | POA: Insufficient documentation

## 2014-10-05 DIAGNOSIS — G4730 Sleep apnea, unspecified: Secondary | ICD-10-CM | POA: Insufficient documentation

## 2014-10-05 DIAGNOSIS — Z862 Personal history of diseases of the blood and blood-forming organs and certain disorders involving the immune mechanism: Secondary | ICD-10-CM | POA: Insufficient documentation

## 2014-10-05 DIAGNOSIS — M1990 Unspecified osteoarthritis, unspecified site: Secondary | ICD-10-CM | POA: Insufficient documentation

## 2014-10-05 DIAGNOSIS — Z79899 Other long term (current) drug therapy: Secondary | ICD-10-CM | POA: Insufficient documentation

## 2014-10-05 DIAGNOSIS — Z923 Personal history of irradiation: Secondary | ICD-10-CM | POA: Diagnosis not present

## 2014-10-05 DIAGNOSIS — F419 Anxiety disorder, unspecified: Secondary | ICD-10-CM | POA: Insufficient documentation

## 2014-10-05 DIAGNOSIS — F329 Major depressive disorder, single episode, unspecified: Secondary | ICD-10-CM | POA: Insufficient documentation

## 2014-10-05 DIAGNOSIS — R110 Nausea: Secondary | ICD-10-CM | POA: Diagnosis not present

## 2014-10-05 NOTE — ED Notes (Signed)
Pt states she feels as though she is going to have "another episode" and requesting to take home meds. MD advised pt otherwise. RN made aware of pt feeling anxious.

## 2014-10-05 NOTE — ED Notes (Signed)
Per ems  Per staff at doctor office, pt had 1 episode of grand mal seizure. Pt is alert and oriented. No hx of seizure, yet Pt stated to ems that she has these episodes. Hx of Breast CA.

## 2014-10-05 NOTE — Discharge Instructions (Signed)
Nonepileptic Seizures °Nonepileptic seizures are seizures that are not caused by abnormal electrical signals in your brain. These seizures often seem like epileptic seizures, but they are not caused by epilepsy.  °There are two types of nonepileptic seizures: °· A physiologic nonepileptic seizure results from a disruption in your brain. °· A psychogenic seizure results from emotional stress. These seizures are sometimes called pseudoseizures. °CAUSES  °Causes of physiologic nonepileptic seizures include:  °· Sudden drop in blood pressure. °· Low blood sugar. °· Low levels of salt (sodium) in your blood. °· Low levels of calcium in your blood. °· Migraine. °· Heart rhythm problems. °· Sleep disorders. °· Drug and alcohol abuse. °Common causes of psychogenic nonepileptic seizures include: °· Stress. °· Emotional trauma. °· Sexual or physical abuse. °· Major life events, such as divorce or the death of a loved one. °· Mental health disorders, including panic attack and hyperactivity disorder. °SIGNS AND SYMPTOMS °A nonepileptic seizure can look like an epileptic seizure, including uncontrollable shaking (convulsions), or changes in attention, behavior, or the ability to remain awake and alert. However, there are some differences. Nonepileptic seizures usually: °· Do not cause physical injuries. °· Start slowly. °· Include crying or shrieking. °· Last longer than 2 minutes. °· Have a short recovery time without headache or exhaustion. °DIAGNOSIS  °Your health care provider can usually diagnose nonepileptic seizures after taking your medical history and giving you a physical exam. Your health care provider may want to talk to your friends or relatives who have seen you have a seizure.  °You may also need to have tests to look for causes of physiologic nonepileptic seizures. This may include an electroencephalogram (EEG), which is a test that measures electrical activity in your brain. If you have had an epileptic  seizure, the results of your EEG will be abnormal. If your health care provider thinks you have had a psychogenic nonepileptic seizure, you may need to see a mental health specialist for an evaluation. °TREATMENT  °Treatment depends on the type and cause of your seizures. °· For physiologic nonepileptic seizures, treatment is aimed at addressing the underlying condition that caused the seizures. These seizures usually stop when the underlying condition is properly treated. °· Nonepileptic seizures do not respond to the seizure medicines used to treat epilepsy. °· For psychogenic seizures, you may need to work with a mental health specialist. °HOME CARE INSTRUCTIONS °Home care will depend on the type of nonepileptic seizures you have.  °· Follow all your health care provider's instructions. °· Keep all your follow-up appointments. °SEEK MEDICAL CARE IF: °You continue to have seizures after treatment. °SEEK IMMEDIATE MEDICAL CARE IF: °· Your seizures change or become more frequent. °· You injure yourself during a seizure. °· You have one seizure after another. °· You have trouble recovering from a seizure. °· You have chest pain or trouble breathing. °MAKE SURE YOU: °· Understand these instructions. °· Will watch your condition. °· Will get help right away if you are not doing well or get worse. °Document Released: 09/27/2005 Document Revised: 12/27/2013 Document Reviewed: 06/08/2013 °ExitCare® Patient Information ©2015 [employer]. This information is not intended to replace advice given to you by your health care provider. Make sure you discuss any questions you have with your health care provider. ° °

## 2014-10-05 NOTE — ED Provider Notes (Signed)
CSN: [id number]     Arrival date & time 10/05/14  1600 History   First MD Initiated Contact with Patient 10/05/14 1607     Chief Complaint  Patient presents with  . psuedoseizures      (Consider location/radiation/quality/duration/timing/severity/associated sxs/prior Treatment) Patient is a 69 y.o. female presenting with seizures.  Seizures Seizure activity on arrival: no   Seizure type:  Grand mal Preceding symptoms: hyperventilation and nausea   Initial focality:  None Episode characteristics: abnormal movements and generalized shaking   Postictal symptoms: no confusion, no memory loss and no somnolence   Return to baseline: yes   Severity:  Mild Duration:  2 minutes Timing:  Once Number of seizures this episode:  1 Progression:  Resolved Context comment:  Ho pseudoseizures, occurred during blood draw, pt has memory of all events Recent head injury:  No recent head injuries PTA treatment:  None History of seizures: yes   Similar to previous episodes: yes   Current therapy:  Clonazepam Compliance with current therapy:  Good   Past Medical History  Diagnosis Date  . Tremor   . Bowel incontinence   . Bladder incontinence   . High blood pressure   . Anemia   . Depression   . Arthritis   . Cancer     breast cancer  . GERD (gastroesophageal reflux disease)   . Full dentures   . HOH (hard of hearing)   . Sleep apnea     has a cpap-5 hr  . Stroke 1980  . Complication of anesthesia     hard to wake up  . Anxiety   . Shortness of breath   . Chronic diarrhea   . Breast cancer 08/16/13    left, 1 o'clock  . Seizures     no meds  . History of radiation therapy 11/02/13- 12/24/13    left breast 4680 cGy in 26 sessions, left breast boost 1400 cGy in 7 sessions   Past Surgical History  Procedure Laterality Date  . Total hip arthroplasty  2005    rt total hip  . Abdominal hysterectomy    . Ovarian cysts    . Back surgery  2005    lumbar fusion  . Colonoscopy    .  Multiple tooth extractions    . Breast lumpectomy with needle localization and axillary sentinel lymph node bx Left 09/29/2013    Procedure: BREAST LUMPECTOMY WITH NEEDLE LOCALIZATION AND AXILLARY SENTINEL LYMPH NODE BX;  Surgeon: [name], MD;  Location: [facility];  Service: General;  Laterality: Left;  clean wound class   Family History  Problem Relation Age of Onset  . Ataxia Neg Hx   . Chorea Neg Hx   . Dementia Neg Hx   . Mental retardation Neg Hx   . Migraines Neg Hx   . Multiple sclerosis Neg Hx   . Neurofibromatosis Neg Hx   . Neuropathy Neg Hx   . Parkinsonism Neg Hx   . Seizures Neg Hx   . Lung cancer Brother   . Stroke Mother    History  Substance Use Topics  . Smoking status: Never Smoker   . Smokeless tobacco: Never Used  . Alcohol Use: No   OB History    No data available     Review of Systems  Neurological: Positive for seizures.  All other systems reviewed and are negative.     Allergies  Codeine  Home Medications   Prior to Admission medications  Medication Sig Start Date End Date Taking? Authorizing Provider  amLODipine (NORVASC) 5 MG tablet Take 10 mg by mouth daily.  06/01/13  Yes Historical Provider, MD  atenolol (TENORMIN) 50 MG tablet Take 50 mg by mouth at bedtime.  06/19/13  Yes Historical Provider, MD  Cetirizine HCl (ZYRTEC ALLERGY PO) Take 1 tablet by mouth daily as needed (allergies).   Yes Historical Provider, MD  citalopram (CELEXA) 40 MG tablet Take 40 mg by mouth at bedtime.  07/25/13  Yes Historical Provider, MD  clonazePAM (KLONOPIN) 1 MG tablet Take 0.5 mg by mouth 3 (three) times daily as needed for anxiety (anxiety).  07/13/13  Yes Historical Provider, MD  levothyroxine (SYNTHROID, LEVOTHROID) 50 MCG tablet Take 50 mcg by mouth daily before breakfast.  06/28/13  Yes Historical Provider, MD  Loperamide HCl (IMODIUM PO) Take 1 tablet by mouth daily as needed (diarrhea).   Yes Historical Provider, MD  naproxen  sodium (ANAPROX) 220 MG tablet Take 220 mg by mouth 2 (two) times daily as needed (pain).   Yes Historical Provider, MD  nystatin cream (MYCOSTATIN) Apply 1 application topically 2 (two) times daily. Patient taking differently: Apply 1 application topically 2 (two) times daily as needed for dry skin.  06/21/14  Yes Rulon Eisenmenger, MD  ranitidine (ZANTAC) 150 MG tablet Take 150 mg by mouth 2 (two) times daily.  07/10/13  Yes Historical Provider, MD   BP 130/61 mmHg  Pulse 55  Temp(Src) 97.6 F (36.4 C) (Oral)  Resp 20  SpO2 94% Physical Exam  Constitutional: She is oriented to person, place, and time. She appears well-developed and well-nourished.  HENT:  Head: Normocephalic and atraumatic.  Right Ear: External ear normal.  Left Ear: External ear normal.  Eyes: Conjunctivae and EOM are normal. Pupils are equal, round, and reactive to light.  Neck: Normal range of motion. Neck supple.  Cardiovascular: Normal rate, regular rhythm, normal heart sounds and intact distal pulses.   Pulmonary/Chest: Effort normal and breath sounds normal.  Abdominal: Soft. Bowel sounds are normal. There is no tenderness.  Musculoskeletal: Normal range of motion.  Neurological: She is alert and oriented to person, place, and time. She has normal strength and normal reflexes. No cranial nerve deficit or sensory deficit. Coordination normal.  Skin: Skin is warm and dry.  Vitals reviewed.   ED Course  Procedures (including critical care time) Labs Review Labs Reviewed - No data to display  Imaging Review Dg Chest 2 View  10/05/2014   CLINICAL DATA:  Shortness of breath, productive cough.  EXAM: CHEST  2 VIEW  COMPARISON:  11/15/2013  FINDINGS: Heart is borderline in size. Mild tortuosity of the thoracic aorta. Lungs are clear. No effusions. No acute bony abnormality.  IMPRESSION: No active cardiopulmonary disease.   Electronically Signed   By: Rolm Baptise M.D.   On: 10/05/2014 17:08     EKG  Interpretation None      MDM   Final diagnoses:  Pseudoseizure    69 y.o. female with pertinent PMH of pseudoseizures, anxiety presents with recurrent pseudoseizure. The patient states that she had a pseudoseizure today. She remembers everything about the event. She states that she took less than normal of her Klonopin. On arrival vital signs and physical exam as above. Patient does endorse cough for the last few weeks.    CXR unremarkable.  Pt at neuro baseline with no neuro deficits.  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 Doubt real seizure, other emergent pathology. Patient does endorse headache, however states that this is her baseline. No recent change. She will follow-up with neurology for this.     I have reviewed all laboratory and imaging studies if ordered as above  1. Pseudoseizure         Debby Freiberg, MD 10/05/14 [phone]

## 2014-12-07 ENCOUNTER — Telehealth: Payer: Self-pay | Admitting: Hematology and Oncology

## 2014-12-07 NOTE — Telephone Encounter (Signed)
Mailed pt medical records to [facility]

## 2014-12-21 ENCOUNTER — Ambulatory Visit: Payer: Medicare Other | Admitting: Hematology and Oncology

## 2014-12-22 ENCOUNTER — Ambulatory Visit: Payer: Medicare Other | Admitting: Hematology and Oncology

## 2014-12-22 NOTE — Assessment & Plan Note (Signed)
Left breast invasive ductal carcinoma T1, N0, M0 stage IA diagnosed December 2014 status post left lumpectomy 09/29/2013 0.9 cm low grade invasive ductal carcinoma ER/PR positive HER-2 negative Ki-67 5% status post radiation therapy. Patient refused antiestrogen therapy  Breast Cancer Surveillance: 1. Breast exam 12/22/2014: Normal 2. Mammogram 08/09/2014 No abnormalities. Postsurgical changes. Breast Density Category B. I recommended that she get 3-D mammograms for surveillance. Discussed the differences between different breast density categories.  Survivorship:Discussed the importance of physical exercise in decreasing the likelihood of breast cancer recurrence. Recommended 30 mins daily 6 days a week of either brisk walking or cycling or swimming. Encouraged patient to eat more fruits and vegetables and decrease red meat.   Return to clinic in 1 year for follow-up

## 2015-02-16 ENCOUNTER — Encounter: Payer: Self-pay | Admitting: Internal Medicine

## 2015-03-19 ENCOUNTER — Emergency Department (HOSPITAL_COMMUNITY): Payer: Medicare (Managed Care)

## 2015-03-19 ENCOUNTER — Emergency Department (HOSPITAL_COMMUNITY)
Admission: EM | Admit: 2015-03-19 | Discharge: 2015-03-21 | Disposition: A | Discharge status: Home / Self Care | Payer: Medicare (Managed Care) | Attending: Emergency Medicine | Admitting: Emergency Medicine

## 2015-03-19 ENCOUNTER — Encounter (HOSPITAL_COMMUNITY): Payer: Self-pay | Admitting: Emergency Medicine

## 2015-03-19 DIAGNOSIS — F445 Conversion disorder with seizures or convulsions: Secondary | ICD-10-CM

## 2015-03-19 DIAGNOSIS — Z923 Personal history of irradiation: Secondary | ICD-10-CM | POA: Insufficient documentation

## 2015-03-19 DIAGNOSIS — I10 Essential (primary) hypertension: Secondary | ICD-10-CM | POA: Diagnosis not present

## 2015-03-19 DIAGNOSIS — R1084 Generalized abdominal pain: Secondary | ICD-10-CM | POA: Diagnosis not present

## 2015-03-19 DIAGNOSIS — Z79899 Other long term (current) drug therapy: Secondary | ICD-10-CM | POA: Diagnosis not present

## 2015-03-19 DIAGNOSIS — F332 Major depressive disorder, recurrent severe without psychotic features: Secondary | ICD-10-CM | POA: Diagnosis present

## 2015-03-19 DIAGNOSIS — Z9981 Dependence on supplemental oxygen: Secondary | ICD-10-CM | POA: Diagnosis not present

## 2015-03-19 DIAGNOSIS — H9190 Unspecified hearing loss, unspecified ear: Secondary | ICD-10-CM | POA: Insufficient documentation

## 2015-03-19 DIAGNOSIS — Z8719 Personal history of other diseases of the digestive system: Secondary | ICD-10-CM | POA: Diagnosis not present

## 2015-03-19 DIAGNOSIS — Z853 Personal history of malignant neoplasm of breast: Secondary | ICD-10-CM | POA: Diagnosis not present

## 2015-03-19 DIAGNOSIS — F329 Major depressive disorder, single episode, unspecified: Secondary | ICD-10-CM | POA: Diagnosis not present

## 2015-03-19 DIAGNOSIS — G4730 Sleep apnea, unspecified: Secondary | ICD-10-CM | POA: Insufficient documentation

## 2015-03-19 DIAGNOSIS — Z8673 Personal history of transient ischemic attack (TIA), and cerebral infarction without residual deficits: Secondary | ICD-10-CM | POA: Diagnosis not present

## 2015-03-19 DIAGNOSIS — Z862 Personal history of diseases of the blood and blood-forming organs and certain disorders involving the immune mechanism: Secondary | ICD-10-CM | POA: Diagnosis not present

## 2015-03-19 DIAGNOSIS — R51 Headache: Secondary | ICD-10-CM | POA: Diagnosis present

## 2015-03-19 DIAGNOSIS — M25511 Pain in right shoulder: Secondary | ICD-10-CM | POA: Insufficient documentation

## 2015-03-19 DIAGNOSIS — R569 Unspecified convulsions: Secondary | ICD-10-CM

## 2015-03-19 DIAGNOSIS — M542 Cervicalgia: Secondary | ICD-10-CM | POA: Insufficient documentation

## 2015-03-19 DIAGNOSIS — M25512 Pain in left shoulder: Secondary | ICD-10-CM | POA: Diagnosis not present

## 2015-03-19 DIAGNOSIS — R251 Tremor, unspecified: Secondary | ICD-10-CM | POA: Insufficient documentation

## 2015-03-19 DIAGNOSIS — F32A Depression, unspecified: Secondary | ICD-10-CM

## 2015-03-19 DIAGNOSIS — F419 Anxiety disorder, unspecified: Secondary | ICD-10-CM | POA: Diagnosis not present

## 2015-03-19 DIAGNOSIS — M1990 Unspecified osteoarthritis, unspecified site: Secondary | ICD-10-CM | POA: Insufficient documentation

## 2015-03-19 LAB — COMPREHENSIVE METABOLIC PANEL
ALK PHOS: 44 U/L (ref 38–126)
ALT: 20 U/L (ref 14–54)
AST: 24 U/L (ref 15–41)
Albumin: 3.4 g/dL — ABNORMAL LOW (ref 3.5–5.0)
Anion gap: 8 (ref 5–15)
BUN: 14 mg/dL (ref 6–20)
CO2: 27 mmol/L (ref 22–32)
Calcium: 8 mg/dL — ABNORMAL LOW (ref 8.9–10.3)
Chloride: 98 mmol/L — ABNORMAL LOW (ref 101–111)
Creatinine, Ser: 0.94 mg/dL (ref 0.44–1.00)
GFR calc non Af Amer: >60 mL/min (ref >60)
Glucose, Bld: 106 mg/dL — ABNORMAL HIGH (ref 65–99)
Potassium: 3.8 mmol/L (ref 3.5–5.1)
Sodium: 133 mmol/L — ABNORMAL LOW (ref 135–145)
TOTAL PROTEIN: 6.5 g/dL (ref 6.5–8.1)
Total Bilirubin: 0.7 mg/dL (ref 0.3–1.2)

## 2015-03-19 LAB — DIFFERENTIAL
BASOS PCT: 0 % (ref 0–1)
Basophils Absolute: 0 10*3/uL (ref 0.0–0.1)
EOS PCT: 3 % (ref 0–5)
Eosinophils Absolute: 0.2 10*3/uL (ref 0.0–0.7)
LYMPHS ABS: 1.5 10*3/uL (ref 0.7–4.0)
LYMPHS PCT: 26 % (ref 12–46)
Monocytes Absolute: 0.5 10*3/uL (ref 0.1–1.0)
Monocytes Relative: 9 % (ref 3–12)
NEUTROS PCT: 62 % (ref 43–77)
Neutro Abs: 3.6 10*3/uL (ref 1.7–7.7)

## 2015-03-19 LAB — PROTIME-INR
INR: 1.08 (ref 0.00–1.49)
PROTHROMBIN TIME: 14.2 s (ref 11.6–15.2)

## 2015-03-19 LAB — I-STAT CHEM 8, ED
BUN: 17 mg/dL (ref 6–20)
CREATININE: 1 mg/dL (ref 0.44–1.00)
Calcium, Ion: 1.15 mmol/L (ref 1.13–1.30)
Chloride: 102 mmol/L (ref 101–111)
Glucose, Bld: 103 mg/dL — ABNORMAL HIGH (ref 65–99)
HCT: 47 % — ABNORMAL HIGH (ref 36.0–46.0)
HEMOGLOBIN: 16 g/dL — AB (ref 12.0–15.0)
POTASSIUM: 3.8 mmol/L (ref 3.5–5.1)
Sodium: 142 mmol/L (ref 135–145)
TCO2: 27 mmol/L (ref 0–100)

## 2015-03-19 LAB — CBC
HEMATOCRIT: 45.2 % (ref 36.0–46.0)
Hemoglobin: 15 g/dL (ref 12.0–15.0)
MCH: 32.3 pg (ref 26.0–34.0)
MCHC: 33.2 g/dL (ref 30.0–36.0)
MCV: 97.4 fL (ref 78.0–100.0)
Platelets: 137 10*3/uL — ABNORMAL LOW (ref 150–400)
RBC: 4.64 MIL/uL (ref 3.87–5.11)
RDW: 14.2 % (ref 11.5–15.5)
WBC: 5.8 10*3/uL (ref 4.0–10.5)

## 2015-03-19 LAB — I-STAT TROPONIN, ED: Troponin i, poc: 0 ng/mL (ref 0.00–0.08)

## 2015-03-19 LAB — APTT: aPTT: 34 seconds (ref 24–37)

## 2015-03-19 LAB — CBG MONITORING, ED: GLUCOSE-CAPILLARY: 116 mg/dL — AB (ref 65–99)

## 2015-03-19 NOTE — ED Notes (Signed)
Per [facility] sudden onset of HA. Pt shaking symptoms not new, but upon arrival resident shaking worse than what EMS had observed. EMS did observe shaking of her hands and face along with stuttering. Pt having mood changes that involve laughing to questioning why she is laughing to becoming upset. VSS. 20G RH

## 2015-03-19 NOTE — ED Notes (Signed)
Dr. Yelverton at the bedside.  

## 2015-03-19 NOTE — ED Provider Notes (Signed)
CSN: [id number]   Arrival date & time 03/19/15 2155  History  This chart was scribed for  Julianne Rice, MD by Altamease Oiler, ED Scribe. This patient was seen in room D30C/D30C and the patient's care was started at 11:11 PM.  Chief Complaint  Patient presents with  . Headache    HPI The history is provided by the patient. No language interpreter was used.   [name] is a 69 y.o. female who presents to the Emergency Department complaining of "pseudo-seizures" starting around 9 PM. Associated symptoms include aching frontal headache, nausea, and difficulty controlling her emotions. Pt notes going from laughing to crying. Tonight she was at dinner when she continued to have trouble controlling her emotions and had a sudden onset headache for which her friends called EMS. No new change in vision. Pt states that she has seen a neurologist and told that her symptoms "have something to do with emotions". No recent change in medication but she has been depressed for 4 days. She has had some transient thoughts of harming herself over recent days but denies current SI.   Past Medical History  Diagnosis Date  . Tremor   . Bowel incontinence   . Bladder incontinence   . High blood pressure   . Anemia   . Depression   . Arthritis   . Cancer     breast cancer  . GERD (gastroesophageal reflux disease)   . Full dentures   . HOH (hard of hearing)   . Sleep apnea     has a cpap-5 hr  . Stroke 1980  . Complication of anesthesia     hard to wake up  . Anxiety   . Shortness of breath   . Chronic diarrhea   . Breast cancer 08/16/13    left, 1 o'clock  . Seizures     no meds  . History of radiation therapy 11/02/13- 12/24/13    left breast 4680 cGy in 26 sessions, left breast boost 1400 cGy in 7 sessions    Past Surgical History  Procedure Laterality Date  . Total hip arthroplasty  2005    rt total hip  . Abdominal hysterectomy    . Ovarian cysts    . Back surgery  2005    lumbar  fusion  . Colonoscopy    . Multiple tooth extractions    . Breast lumpectomy with needle localization and axillary sentinel lymph node bx Left 09/29/2013    Procedure: BREAST LUMPECTOMY WITH NEEDLE LOCALIZATION AND AXILLARY SENTINEL LYMPH NODE BX;  Surgeon: [name], MD;  Location: [facility];  Service: General;  Laterality: Left;  clean wound class    Family History  Problem Relation Age of Onset  . Ataxia Neg Hx   . Chorea Neg Hx   . Dementia Neg Hx   . Mental retardation Neg Hx   . Migraines Neg Hx   . Multiple sclerosis Neg Hx   . Neurofibromatosis Neg Hx   . Neuropathy Neg Hx   . Parkinsonism Neg Hx   . Seizures Neg Hx   . Lung cancer Brother   . Stroke Mother     History  Substance Use Topics  . Smoking status: Never Smoker   . Smokeless tobacco: Never Used  . Alcohol Use: No     Review of Systems  Constitutional: Negative for fever and chills.  Eyes: Negative for visual disturbance.  Respiratory: Negative for shortness of breath.   Cardiovascular: Negative for  chest pain.  Gastrointestinal: Negative for nausea, vomiting and abdominal pain.  Genitourinary: Negative for dysuria and flank pain.  Musculoskeletal: Positive for neck pain. Negative for back pain and neck stiffness.  Neurological: Positive for tremors and headaches. Negative for dizziness, syncope, weakness, light-headedness and numbness.  Psychiatric/Behavioral: Positive for suicidal ideas and behavioral problems. The patient is nervous/anxious.   All other systems reviewed and are negative.    Home Medications   Prior to Admission medications   Medication Sig Start Date End Date Taking? Authorizing Provider  amLODipine (NORVASC) 5 MG tablet Take 10 mg by mouth daily.  06/01/13  Yes Historical Provider, MD  atenolol (TENORMIN) 50 MG tablet Take 50 mg by mouth at bedtime.  06/19/13  Yes Historical Provider, MD  Cetirizine HCl (ZYRTEC ALLERGY PO) Take 1 tablet by mouth at bedtime.    Yes  Historical Provider, MD  clonazePAM (KLONOPIN) 1 MG tablet Take 0.5 mg by mouth 3 (three) times daily as needed for anxiety (anxiety).  07/13/13  Yes Historical Provider, MD  levothyroxine (SYNTHROID, LEVOTHROID) 50 MCG tablet Take 50 mcg by mouth daily before breakfast.  06/28/13  Yes Historical Provider, MD  Loperamide HCl (IMODIUM PO) Take 1 tablet by mouth daily as needed (diarrhea).   Yes Historical Provider, MD  naproxen sodium (ANAPROX) 220 MG tablet Take 220 mg by mouth 2 (two) times daily as needed (pain).   Yes Historical Provider, MD  nystatin cream (MYCOSTATIN) Apply 1 application topically 2 (two) times daily. Patient taking differently: Apply 1 application topically 2 (two) times daily as needed for dry skin.  06/21/14  Yes Nicholas Lose, MD  PRESCRIPTION MEDICATION Take 1 tablet by mouth at bedtime. Anti depressant   Yes Historical Provider, MD  PRESCRIPTION MEDICATION Take 1 tablet by mouth every evening. Tablet for bladder   Yes Historical Provider, MD  PRESCRIPTION MEDICATION Take 1 tablet by mouth every evening. cholesterol medication   Yes Historical Provider, MD    Allergies  Codeine  Triage Vitals: BP 110/53 mmHg  Pulse 63  Temp(Src) 98.2 F (36.8 C) (Oral)  Resp 14  Ht 5\' 3"  (1.6 m)  Wt 250 lb (113.399 kg)  BMI 44.30 kg/m2  SpO2 94%  Physical Exam  Constitutional: She is oriented to person, place, and time. She appears well-developed and well-nourished. No distress.  HENT:  Head: Normocephalic and atraumatic.  Mouth/Throat: Oropharynx is clear and moist. No oropharyngeal exudate.  No sinus tenderness with percussion.  Eyes: EOM are normal. Pupils are equal, round, and reactive to light.  Neck: Normal range of motion. Neck supple.  No meningismus. Patient with mild bilateral paracervical tenderness with palpation and bilateral trapezius tenderness with palpation. There is no obvious injury. No midline cervical tenderness with palpation.  Cardiovascular: Normal  rate and regular rhythm.   Pulmonary/Chest: Effort normal and breath sounds normal. No respiratory distress. She has no wheezes. She has no rales.  Abdominal: Soft. Bowel sounds are normal. She exhibits no distension and no mass. There is tenderness (mild diffuse tenderness without focality.). There is no rebound and no guarding.  Musculoskeletal: Normal range of motion. She exhibits no edema or tenderness.  Distal pulses intact.  Neurological: She is alert and oriented to person, place, and time.  Patient is alert and oriented x3 with clear, goal oriented speech. Patient has 5/5 motor in all extremities. Sensation is intact to light touch. Bilateral finger-to-nose is normal with no signs of dysmetria. Patient with tremor that seems to be improved with distraction.  Skin: Skin is warm and dry. No rash noted. No erythema.  Psychiatric:  Denies current suicidal ideation. Dysphoric mood. Emotionally labile.  Nursing note and vitals reviewed.   ED Course  Procedures   DIAGNOSTIC STUDIES: Oxygen Saturation is 94% on RA, normal by my interpretation.    COORDINATION OF CARE: 11:14 PM Discussed treatment plan which includes lab work, CT head without contrast, and EKG with pt at bedside and pt agreed to plan.  3:27 AM-Consult complete with psychiatric team who states that the patient meets inpatient criteria and that they will look for an inpatient bed for her.  Call ended at 3:28 AM.   Labs Review-  Labs Reviewed  CBC - Abnormal; Notable for the following:    Platelets 137 (*)    All other components within normal limits  COMPREHENSIVE METABOLIC PANEL - Abnormal; Notable for the following:    Sodium 133 (*)    Chloride 98 (*)    Glucose, Bld 106 (*)    Calcium 8.0 (*)    Albumin 3.4 (*)    All other components within normal limits  CBG MONITORING, ED - Abnormal; Notable for the following:    Glucose-Capillary 116 (*)    All other components within normal limits  I-STAT CHEM 8, ED -  Abnormal; Notable for the following:    Glucose, Bld 103 (*)    Hemoglobin 16.0 (*)    HCT 47.0 (*)    All other components within normal limits  PROTIME-INR  APTT  DIFFERENTIAL  URINALYSIS, ROUTINE W REFLEX MICROSCOPIC (NOT AT [facility])  Randolm Idol, ED    Imaging Review Dg Chest 2 View  03/20/2015   CLINICAL DATA:  Medical clearance for psych placement  EXAM: CHEST - 2 VIEW  COMPARISON:  10/05/14  FINDINGS: Cardiac shadow is stable. Tortuosity of the thoracic aorta is noted. Calcific changes are again seen. The lungs are clear bilaterally. No acute bony abnormality is seen. Postsurgical changes on the left are noted.  IMPRESSION: No active disease.   Electronically Signed   By: Inez Catalina M.D.   On: 03/20/2015 12:25   Ct Head Wo Contrast  03/19/2015   CLINICAL DATA:  Sudden onset headache  EXAM: CT HEAD WITHOUT CONTRAST  TECHNIQUE: Contiguous axial images were obtained from the base of the skull through the vertex without intravenous contrast.  COMPARISON:  11/13/2005  FINDINGS: There is no intracranial hemorrhage, mass or evidence of acute infarction. There is severe white matter hypodensity consistent with chronic small vessel disease and this is worsened from 2007. There is moderate generalized atrophy. No bone abnormalities are evident. The visible paranasal sinuses are clear.  IMPRESSION: Generalized atrophy and severe chronic small vessel disease. No acute findings.   Electronically Signed   By: Andreas Newport M.D.   On: 03/19/2015 23:10   EKG Interpretation  Date/Time:  Sunday March 19 2015 23:05:49 EDT Ventricular Rate:  63 PR Interval:  181 QRS Duration: 90 QT Interval:  438 QTC Calculation: 448 R Axis:   30 Text Interpretation:  Sinus rhythm Consider left atrial enlargement Nonspecific T abnormalities, lateral leads Confirmed by Lita Mains  MD, Shadi Larner ([id number]) on 03/20/2015 3:38:47 AM       MDM   Final diagnoses:  None     I personally performed the services  described in this documentation, which was scribed in my presence. The recorded information has been reviewed and is accurate.  Patient with ongoing tremor appears to be unchanged from her baseline. CT head  without any acute findings. Given recent suicidal fault's will have psychiatry evaluate the patient. She's been medically cleared  TTS recommends inpatient treatment.    Julianne Rice, MD 03/21/15 [phone]

## 2015-03-19 NOTE — ED Notes (Signed)
Patient transported to CT 

## 2015-03-19 NOTE — ED Notes (Signed)
MD at bedside. 

## 2015-03-20 ENCOUNTER — Emergency Department (HOSPITAL_COMMUNITY): Payer: Medicare (Managed Care)

## 2015-03-20 DIAGNOSIS — F329 Major depressive disorder, single episode, unspecified: Secondary | ICD-10-CM | POA: Diagnosis not present

## 2015-03-20 LAB — URINALYSIS, ROUTINE W REFLEX MICROSCOPIC
Bilirubin Urine: NEGATIVE
GLUCOSE, UA: NEGATIVE mg/dL
Hgb urine dipstick: NEGATIVE
KETONES UR: NEGATIVE mg/dL
Leukocytes, UA: NEGATIVE
NITRITE: NEGATIVE
Protein, ur: NEGATIVE mg/dL
Specific Gravity, Urine: 1.012 (ref 1.005–1.030)
Urobilinogen, UA: 1 mg/dL (ref 0.0–1.0)
pH: 7 (ref 5.0–8.0)

## 2015-03-20 MED ORDER — CLONAZEPAM 0.5 MG PO TABS
0.5000 mg | ORAL_TABLET | Freq: Three times a day (TID) | ORAL | Status: DC | PRN
  Administered 2015-03-20 – 2015-03-21 (×2): 0.5 mg via ORAL
  Filled 2015-03-20 (×3): qty 1

## 2015-03-20 MED ORDER — ATENOLOL 25 MG PO TABS
50.0000 mg | ORAL_TABLET | Freq: Every day | ORAL | Status: DC
  Filled 2015-03-20: qty 2

## 2015-03-20 MED ORDER — AMLODIPINE BESYLATE 5 MG PO TABS
10.0000 mg | ORAL_TABLET | Freq: Every day | ORAL | Status: DC
Start: 2015-03-20 — End: 2015-03-21
  Administered 2015-03-20 – 2015-03-21 (×2): 10 mg via ORAL
  Filled 2015-03-20 (×2): qty 2

## 2015-03-20 MED ORDER — LEVOTHYROXINE SODIUM 50 MCG PO TABS
50.0000 ug | ORAL_TABLET | Freq: Every day | ORAL | Status: DC
  Administered 2015-03-21: 50 ug via ORAL
  Filled 2015-03-20 (×3): qty 1

## 2015-03-20 MED ORDER — ACETAMINOPHEN 325 MG PO TABS
650.0000 mg | ORAL_TABLET | ORAL | Status: DC | PRN
  Filled 2015-03-20: qty 2

## 2015-03-20 NOTE — ED Notes (Signed)
PT observed resting with eyes closed. Pt opened eyes and started laughing followed by a cry spell. Resident did not respond to the writer. Pt then closed her eyes, opened them, started laughing and then crying again. Pt then states "why wont they give me my medicine?" Pt now resting with her eyes closed.

## 2015-03-20 NOTE — ED Notes (Signed)
Pt on phone with son, Mortimer Fries. Informed pt that this was her second phone call.

## 2015-03-20 NOTE — ED Notes (Signed)
TTS in progress 

## 2015-03-20 NOTE — ED Notes (Signed)
PLEASE CALL PT'S CHURCH FRIEND, JANE EAST - [phone] - WHEN PT HAS BEEN ACCEPTED TO A FACILITY PER PT'S REQUEST.

## 2015-03-20 NOTE — Progress Notes (Addendum)
Patient's chest x-ray have been faxed to [facility].  Referral was followed up at: [facility] - per Nunzio Cory, referral under review. Per Anderson Malta, MD to review pt's low potasium level and pseudo seizures in am on 7/26. [facility] - per intake, fax referral for waitlist. [facility] - per Caryl Pina, fax it. [facility] per [facility], fax it for review, beds open.  At capacity: [facility] - per Chestine Spore - per Opal Sidles, check back in am on 7/26, at capacity tonight and not sure about tomorrow. [facility]   CSW will continue to seek placement.   Verlon Setting, [facility] Disposition staff 03/20/2015 3:39 PM

## 2015-03-20 NOTE — ED Notes (Signed)
Pt on phone at nurses' desk. 

## 2015-03-20 NOTE — ED Notes (Signed)
Friends Evelina Bucy [phone]) and Ara Kussmaul [phone]) left phone numbers and requested to be contacted if needed.

## 2015-03-20 NOTE — Progress Notes (Signed)
Followed up on inpatient placement efforts:  Referral made: [facility]- per [place]- per [facility]- per Tama Gander- will review today per Nunzio Cory (writer will fax chest x-ray results when complete)  Left voicemail with [facility] and will refer if there is availability.  Pt declined at [facility]- medically inappropriate per [facility].   Sharren Bridge, MSW, [place] Clinical Social Work, Disposition  03/20/2015 [phone]

## 2015-03-20 NOTE — BH Assessment (Signed)
Reviewed ED notes prior to initiating assessment. Per notes pt came in due to pseudo seizures and labile emotions..   She was seen in ED in 09/2014 for similar.   Requested cart be placed with pt for assessment.   Assessment to commence shortly.    Lear Ng, [facility] Triage Specialist 03/20/2015 2:10 AM
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
## 2015-03-20 NOTE — ED Notes (Signed)
PT REQUESTED FOR RN TO DISCUSS TX AND PLAN W/HER FRIENDS FROM [facility] PRESENT ([place]). ADVISED WAITING PLACEMENT. PT THEN NOTED TO BE TEARFUL. ALLOWED FOR PT TO VENT HER FEELINGS.

## 2015-03-20 NOTE — ED Notes (Signed)
Will administer pt's meds when awakens d/t pt was noted w/anxiety prior to sleeping.

## 2015-03-20 NOTE — ED Notes (Signed)
Meal tray ordered for patient.

## 2015-03-21 DIAGNOSIS — F332 Major depressive disorder, recurrent severe without psychotic features: Secondary | ICD-10-CM | POA: Diagnosis present

## 2015-03-21 DIAGNOSIS — F445 Conversion disorder with seizures or convulsions: Secondary | ICD-10-CM

## 2015-03-21 DIAGNOSIS — F329 Major depressive disorder, single episode, unspecified: Secondary | ICD-10-CM | POA: Diagnosis not present

## 2015-03-21 DIAGNOSIS — R569 Unspecified convulsions: Secondary | ICD-10-CM

## 2015-03-21 MED ORDER — CLONAZEPAM 0.5 MG PO TABS: 0.5000 mg | ORAL_TABLET | Freq: Three times a day (TID) | ORAL | Status: DC | PRN

## 2015-03-21 MED ORDER — CITALOPRAM HYDROBROMIDE 10 MG PO TABS
20.0000 mg | ORAL_TABLET | Freq: Every day | ORAL | Status: DC
  Administered 2015-03-21: 20 mg via ORAL
  Filled 2015-03-21: qty 2

## 2015-03-21 MED ORDER — CITALOPRAM HYDROBROMIDE 20 MG PO TABS: 20.0000 mg | ORAL_TABLET | Freq: Every day | ORAL | Status: DC

## 2015-03-21 NOTE — ED Notes (Signed)
Pt meal tray delivered, pt given time to eat for discharge.

## 2015-03-21 NOTE — ED Notes (Signed)
Psychiatrist at bedside
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
## 2015-03-21 NOTE — ED Notes (Signed)
Breakfast tray received.

## 2015-03-21 NOTE — Progress Notes (Signed)
LCSW met with patient and her friend from [place]. Patient amendable to outpatient therapy as she has transportation to go to appointment and active insurance. Friend reports she can also help with transportation and given information for therapy resources.  Patient to return to her ILF and her Service Coordinator at facility called: Jocelyn Lamer who is also involved with patient and will help with any paperwork she needs regarding  Medicaid or food stamps. Patient will also be going to church and has a strong church family.    Referrals for therapy completed. Patient's PCP is not in the office and office is closed until August 1. Patient is an active patient of Dr. Ayesha Rumpf and will call on Monday to make appointment.  Phoebe Sharps will also hold patient accountable.  Patient has a brother and daughter in law who she stays with and LCSW reviewed coping skills, safety plan, and anxiety/depression phsyical symptoms associated with medical.  Patient aware of some coping skills but will benefit from outpatient therapy to enhance and apply to past traumas.  No other needs. DC with friend in private vehicle.  Lane Hacker, MSW Clinical Social Work: Emergency Room [phone]

## 2015-03-21 NOTE — ED Notes (Signed)
Patient ambulated to rest room with sitter. No distress noted.

## 2015-03-21 NOTE — ED Notes (Signed)
Pt. Has visitor at bedside. 

## 2015-03-21 NOTE — Progress Notes (Signed)
**Note Ashley-Identified via Obfuscation** Patient discussed in long length of stay meeting with Director: [name]. Patient to be evaluated by Dr. Lenna Sciara today for final disposition.  Will follow up with recommendations after patient has been evaluated and treatment plan in placed.  Lane Hacker, MSW Clinical Social Work: Emergency Room [phone]

## 2015-03-21 NOTE — Discharge Instructions (Signed)
Take your medication as directed. Refer to attached documents for more information. Return to the ED with worsening or concerning symptoms.

## 2015-03-21 NOTE — ED Notes (Signed)
Patient sitting on side of bed, watching television. Requested Ginger ale to drink. Ice water offered and provided to patient - as per protocol. Patient provided Klonopin to help her to rest. Denies pain at this time. No distress noted. Sitter at bedside.

## 2015-05-05 ENCOUNTER — Ambulatory Visit: Payer: Medicare Other | Admitting: Internal Medicine

## 2015-07-07 ENCOUNTER — Encounter (HOSPITAL_COMMUNITY)
Admission: RE | Admit: 2015-07-07 | Discharge: 2015-07-07 | Disposition: A | Discharge status: Home / Self Care | Payer: Medicare (Managed Care) | Source: Ambulatory Visit | Attending: Orthopedic Surgery | Admitting: Orthopedic Surgery

## 2015-07-07 ENCOUNTER — Encounter (HOSPITAL_COMMUNITY): Payer: Self-pay

## 2015-07-07 DIAGNOSIS — Z23 Encounter for immunization: Secondary | ICD-10-CM | POA: Diagnosis not present

## 2015-07-07 DIAGNOSIS — Z853 Personal history of malignant neoplasm of breast: Secondary | ICD-10-CM | POA: Diagnosis not present

## 2015-07-07 DIAGNOSIS — Z6841 Body Mass Index (BMI) 40.0 and over, adult: Secondary | ICD-10-CM | POA: Diagnosis not present

## 2015-07-07 DIAGNOSIS — Z79899 Other long term (current) drug therapy: Secondary | ICD-10-CM | POA: Diagnosis not present

## 2015-07-07 DIAGNOSIS — M1990 Unspecified osteoarthritis, unspecified site: Secondary | ICD-10-CM | POA: Diagnosis not present

## 2015-07-07 DIAGNOSIS — G4730 Sleep apnea, unspecified: Secondary | ICD-10-CM | POA: Diagnosis not present

## 2015-07-07 DIAGNOSIS — S6992XA Unspecified injury of left wrist, hand and finger(s), initial encounter: Secondary | ICD-10-CM | POA: Diagnosis not present

## 2015-07-07 DIAGNOSIS — Z96641 Presence of right artificial hip joint: Secondary | ICD-10-CM | POA: Diagnosis not present

## 2015-07-07 DIAGNOSIS — Z8673 Personal history of transient ischemic attack (TIA), and cerebral infarction without residual deficits: Secondary | ICD-10-CM | POA: Diagnosis not present

## 2015-07-07 DIAGNOSIS — E039 Hypothyroidism, unspecified: Secondary | ICD-10-CM | POA: Diagnosis not present

## 2015-07-07 DIAGNOSIS — K219 Gastro-esophageal reflux disease without esophagitis: Secondary | ICD-10-CM | POA: Diagnosis not present

## 2015-07-07 DIAGNOSIS — S52579A Other intraarticular fracture of lower end of unspecified radius, initial encounter for closed fracture: Secondary | ICD-10-CM | POA: Diagnosis not present

## 2015-07-07 HISTORY — DX: Pneumonia, unspecified organism: J18.9

## 2015-07-07 HISTORY — DX: Hypothyroidism, unspecified: E03.9

## 2015-07-07 HISTORY — DX: Unspecified urinary incontinence: R32

## 2015-07-07 LAB — CBC
HEMATOCRIT: 47.1 % — AB (ref 36.0–46.0)
HEMOGLOBIN: 15.2 g/dL — AB (ref 12.0–15.0)
MCH: 31.5 pg (ref 26.0–34.0)
MCHC: 32.3 g/dL (ref 30.0–36.0)
MCV: 97.7 fL (ref 78.0–100.0)
Platelets: 136 10*3/uL — ABNORMAL LOW (ref 150–400)
RBC: 4.82 MIL/uL (ref 3.87–5.11)
RDW: 13.9 % (ref 11.5–15.5)
WBC: 5.8 10*3/uL (ref 4.0–10.5)

## 2015-07-07 LAB — BASIC METABOLIC PANEL
Anion gap: 6 (ref 5–15)
BUN: 18 mg/dL (ref 6–20)
CALCIUM: 8.9 mg/dL (ref 8.9–10.3)
CO2: 26 mmol/L (ref 22–32)
Chloride: 106 mmol/L (ref 101–111)
Creatinine, Ser: 0.97 mg/dL (ref 0.44–1.00)
GFR calc Af Amer: >60 mL/min (ref >60)
GFR, EST NON AFRICAN AMERICAN: 58 mL/min — AB (ref >60)
GLUCOSE: 101 mg/dL — AB (ref 65–99)
POTASSIUM: 3.9 mmol/L (ref 3.5–5.1)
Sodium: 138 mmol/L (ref 135–145)

## 2015-07-07 NOTE — Pre-Procedure Instructions (Addendum)
[name]  07/07/2015      [id number] [place], [place] [id number] [place] [id number] Phone: [phone] Fax: [phone]    Your procedure is scheduled on 07/08/15   Report to [facility] emergency department at 730 A.M.  Call this number if you have problems the morning of surgery:  [phone]   Remember:  Do not eat food or drink liquids after midnight.  Take these medicines the morning of surgery with A SIP OF WATER amlodipine, atenolol, clonazepam, lexapro. Levothyroxine  STOP all herbel meds, nsaids (aleve,naproxen,advil,ibuprofen) including aspirin,vitamins NOW   Do not wear jewelry, make-up or nail polish.  Do not wear lotions, powders, or perfumes.  You may wear deodorant.  Do not shave 48 hours prior to surgery.  Men may shave face and neck.  Do not bring valuables to the hospital.  [facility] is not responsible for any belongings or valuables.  Contacts, dentures or bridgework may not be worn into surgery.  Leave your suitcase in the car.  After surgery it may be brought to your room.  For patients admitted to the hospital, discharge time will be determined by your treatment team.  Patients discharged the day of surgery will not be allowed to drive home.   Name and phone number of your driver:    Special instructions:   Special Instructions: [facility] - Preparing for Surgery  Before surgery, you can play an important role.  Because skin is not sterile, your skin needs to be as free of germs as possible.  You can reduce the number of germs on you skin by washing with CHG (chlorahexidine gluconate) soap before surgery.  CHG is an antiseptic cleaner which kills germs and bonds with the skin to continue killing germs even after washing.  Please DO NOT use if you have an allergy to CHG or antibacterial soaps.  If your skin becomes reddened/irritated stop using the CHG and  inform your nurse when you arrive at Short Stay.  Do not shave (including legs and underarms) for at least 48 hours prior to the first CHG shower.  You may shave your face.  Please follow these instructions carefully:   1.  Shower with CHG Soap the night before surgery and the morning of Surgery.  2.  If you choose to wash your hair, wash your hair first as usual with your normal shampoo.  3.  After you shampoo, rinse your hair and body thoroughly to remove the Shampoo.  4.  Use CHG as you would any other liquid soap.  You can apply chg directly  to the skin and wash gently with scrungie or a clean washcloth.  5.  Apply the CHG Soap to your body ONLY FROM THE NECK DOWN.  Do not use on open wounds or open sores.  Avoid contact with your eyes ears, mouth and genitals (private parts).  Wash genitals (private parts)       with your normal soap.  6.  Wash thoroughly, paying special attention to the area where your surgery will be performed.  7.  Thoroughly rinse your body with warm water from the neck down.  8.  DO NOT shower/wash with your normal soap after using and rinsing off the CHG Soap.  9.  Pat yourself dry with a clean towel.            10.  Wear clean pajamas.  11.  Place clean sheets on your bed the night of your first shower and do not sleep with pets.  Day of Surgery  Do not apply any lotions/deodorants the morning of surgery.  Please wear clean clothes to the hospital/surgery center.  Please read over the following fact sheets that you were given. Pain Booklet, Coughing and Deep Breathing and Surgical Site Infection Prevention

## 2015-07-07 NOTE — Progress Notes (Signed)
Anesthesia Chart Review: Patient is a 69 year old female scheduled for ORIF left wrist fracture and repair as indicated tomorrow by Dr. Caralyn Guile. Anesthesia type is posted for regional.  History includes never smoker, tremor, pseudoseizures, HTN, anemia, depression, breast cancer s/p left breast lumpectomy and axillary SN biopsy and radiation '15, GERD, arthritis, anxiety, OSA without CPAP compliance, exertional dyspnea, CVA '80, hard of hearing, multiple back surgeries, THA, hysterectomy, bowel and bladder incontinence. BMI is consistent with morbid obesity. PCP is Dr. Lin Landsman at [facility]. HEM-ONC is Dr. Lindi Adie.  03/19/15 EKG: SR, consider LAE, non-specific T wave abnormalities, lateral leads.  Last echo was in 2007 (EF 50-55%, study inadequate to evaluation for LV regional wall motion abnormalities).   03/20/15 CXR: IMPRESSION: No active disease.  Preoperative labs noted.   Definitive anesthesia plan following anesthesiologist evaluation tomorrow.  [facility] Short Stay Center/Anesthesiology Phone [phone] 07/07/2015 1:07 PM

## 2015-07-07 NOTE — Progress Notes (Signed)
Patient called to arrive at 830 to ed.spoke with patient

## 2015-07-08 ENCOUNTER — Encounter (HOSPITAL_COMMUNITY)
Admission: RE | Disposition: A | Discharge status: Home / Self Care | Payer: Self-pay | Source: Ambulatory Visit | Attending: Orthopedic Surgery

## 2015-07-08 ENCOUNTER — Ambulatory Visit (HOSPITAL_COMMUNITY): Payer: Medicare (Managed Care) | Admitting: Anesthesiology

## 2015-07-08 ENCOUNTER — Ambulatory Visit (HOSPITAL_COMMUNITY)
Admission: RE | Admit: 2015-07-08 | Discharge: 2015-07-09 | Disposition: A | Discharge status: Home / Self Care | Payer: Medicare (Managed Care) | Source: Ambulatory Visit | Attending: Orthopedic Surgery | Admitting: Orthopedic Surgery

## 2015-07-08 ENCOUNTER — Ambulatory Visit (HOSPITAL_COMMUNITY): Payer: Medicare (Managed Care) | Admitting: Vascular Surgery

## 2015-07-08 ENCOUNTER — Encounter (HOSPITAL_COMMUNITY): Payer: Self-pay | Admitting: *Deleted

## 2015-07-08 DIAGNOSIS — Z23 Encounter for immunization: Secondary | ICD-10-CM | POA: Diagnosis not present

## 2015-07-08 DIAGNOSIS — M1990 Unspecified osteoarthritis, unspecified site: Secondary | ICD-10-CM | POA: Insufficient documentation

## 2015-07-08 DIAGNOSIS — S52579A Other intraarticular fracture of lower end of unspecified radius, initial encounter for closed fracture: Secondary | ICD-10-CM | POA: Diagnosis not present

## 2015-07-08 DIAGNOSIS — Z8673 Personal history of transient ischemic attack (TIA), and cerebral infarction without residual deficits: Secondary | ICD-10-CM | POA: Diagnosis not present

## 2015-07-08 DIAGNOSIS — Z79899 Other long term (current) drug therapy: Secondary | ICD-10-CM | POA: Insufficient documentation

## 2015-07-08 DIAGNOSIS — S52502A Unspecified fracture of the lower end of left radius, initial encounter for closed fracture: Secondary | ICD-10-CM | POA: Diagnosis present

## 2015-07-08 DIAGNOSIS — Z853 Personal history of malignant neoplasm of breast: Secondary | ICD-10-CM | POA: Diagnosis not present

## 2015-07-08 DIAGNOSIS — G4730 Sleep apnea, unspecified: Secondary | ICD-10-CM | POA: Insufficient documentation

## 2015-07-08 DIAGNOSIS — Z96641 Presence of right artificial hip joint: Secondary | ICD-10-CM | POA: Insufficient documentation

## 2015-07-08 DIAGNOSIS — S6992XA Unspecified injury of left wrist, hand and finger(s), initial encounter: Secondary | ICD-10-CM | POA: Insufficient documentation

## 2015-07-08 DIAGNOSIS — Z6841 Body Mass Index (BMI) 40.0 and over, adult: Secondary | ICD-10-CM | POA: Insufficient documentation

## 2015-07-08 DIAGNOSIS — K219 Gastro-esophageal reflux disease without esophagitis: Secondary | ICD-10-CM | POA: Insufficient documentation

## 2015-07-08 DIAGNOSIS — E039 Hypothyroidism, unspecified: Secondary | ICD-10-CM | POA: Insufficient documentation

## 2015-07-08 HISTORY — PX: ORIF WRIST FRACTURE: SHX2133

## 2015-07-08 SURGERY — OPEN REDUCTION INTERNAL FIXATION (ORIF) WRIST FRACTURE
Anesthesia: Regional | Site: Wrist | Laterality: Left

## 2015-07-08 MED ORDER — LIDOCAINE HCL (CARDIAC) 20 MG/ML IV SOLN
INTRAVENOUS | Status: AC
  Filled 2015-07-08: qty 5

## 2015-07-08 MED ORDER — VITAMIN D3 25 MCG (1000 UNIT) PO TABS
1000.0000 [IU] | ORAL_TABLET | Freq: Every day | ORAL | Status: DC
  Administered 2015-07-09: 1000 [IU] via ORAL
  Filled 2015-07-08 (×3): qty 1

## 2015-07-08 MED ORDER — LIDOCAINE HCL (CARDIAC) 20 MG/ML IV SOLN
INTRAVENOUS | Status: DC | PRN
  Administered 2015-07-08: 40 mg via INTRAVENOUS

## 2015-07-08 MED ORDER — CEFAZOLIN SODIUM 1-5 GM-% IV SOLN
1.0000 g | Freq: Three times a day (TID) | INTRAVENOUS | Status: DC
  Administered 2015-07-08 – 2015-07-09 (×3): 1 g via INTRAVENOUS
  Filled 2015-07-08 (×5): qty 50

## 2015-07-08 MED ORDER — SUCCINYLCHOLINE CHLORIDE 20 MG/ML IJ SOLN
INTRAMUSCULAR | Status: AC
  Filled 2015-07-08: qty 1

## 2015-07-08 MED ORDER — PROPOFOL 10 MG/ML IV BOLUS
INTRAVENOUS | Status: DC | PRN
  Administered 2015-07-08: 30 mg via INTRAVENOUS
  Administered 2015-07-08: 120 mg via INTRAVENOUS

## 2015-07-08 MED ORDER — OXYCODONE HCL 5 MG PO TABS: 5.0000 mg | ORAL_TABLET | ORAL | Status: DC | PRN

## 2015-07-08 MED ORDER — FENTANYL CITRATE (PF) 100 MCG/2ML IJ SOLN
INTRAMUSCULAR | Status: DC | PRN
  Administered 2015-07-08: 25 ug via INTRAVENOUS
  Administered 2015-07-08: 50 ug via INTRAVENOUS

## 2015-07-08 MED ORDER — VITAMIN C 500 MG PO TABS: 500.0000 mg | ORAL_TABLET | Freq: Every day | ORAL | Status: DC

## 2015-07-08 MED ORDER — PROPOFOL 10 MG/ML IV BOLUS
INTRAVENOUS | Status: AC
  Filled 2015-07-08: qty 20

## 2015-07-08 MED ORDER — ARTIFICIAL TEARS OP OINT
TOPICAL_OINTMENT | OPHTHALMIC | Status: AC
  Filled 2015-07-08: qty 3.5

## 2015-07-08 MED ORDER — DOCUSATE SODIUM 100 MG PO CAPS: 100.0000 mg | ORAL_CAPSULE | Freq: Two times a day (BID) | ORAL | Status: DC

## 2015-07-08 MED ORDER — ATENOLOL 25 MG PO TABS
25.0000 mg | ORAL_TABLET | Freq: Every day | ORAL | Status: DC
  Administered 2015-07-08: 25 mg via ORAL
  Filled 2015-07-08 (×2): qty 1

## 2015-07-08 MED ORDER — FENTANYL CITRATE (PF) 100 MCG/2ML IJ SOLN: 25.0000 ug | INTRAMUSCULAR | Status: DC | PRN

## 2015-07-08 MED ORDER — CHLORHEXIDINE GLUCONATE 4 % EX LIQD: 60.0000 mL | Freq: Once | CUTANEOUS | Status: DC

## 2015-07-08 MED ORDER — CEFAZOLIN SODIUM 1-5 GM-% IV SOLN
1.0000 g | INTRAVENOUS | Status: AC
  Administered 2015-07-08: 1 g via INTRAVENOUS

## 2015-07-08 MED ORDER — LEVOTHYROXINE SODIUM 50 MCG PO TABS
50.0000 ug | ORAL_TABLET | Freq: Every day | ORAL | Status: DC
  Administered 2015-07-09: 50 ug via ORAL
  Filled 2015-07-08: qty 1

## 2015-07-08 MED ORDER — ONDANSETRON HCL 4 MG PO TABS
4.0000 mg | ORAL_TABLET | Freq: Four times a day (QID) | ORAL | Status: DC | PRN
Start: 2015-07-08 — End: 2015-07-09

## 2015-07-08 MED ORDER — SENNOSIDES-DOCUSATE SODIUM 8.6-50 MG PO TABS
1.0000 | ORAL_TABLET | Freq: Every evening | ORAL | Status: DC | PRN
  Administered 2015-07-08: 1 via ORAL
  Filled 2015-07-08: qty 1

## 2015-07-08 MED ORDER — CEFAZOLIN SODIUM 1-5 GM-% IV SOLN
INTRAVENOUS | Status: AC
  Filled 2015-07-08: qty 50

## 2015-07-08 MED ORDER — DIPHENHYDRAMINE HCL 25 MG PO CAPS
25.0000 mg | ORAL_CAPSULE | Freq: Four times a day (QID) | ORAL | Status: DC | PRN
  Administered 2015-07-09: 25 mg via ORAL
  Filled 2015-07-08: qty 1

## 2015-07-08 MED ORDER — KCL IN DEXTROSE-NACL 20-5-0.45 MEQ/L-%-% IV SOLN
INTRAVENOUS | Status: DC
  Administered 2015-07-08: 15:00:00 via INTRAVENOUS
  Filled 2015-07-08: qty 1000

## 2015-07-08 MED ORDER — SODIUM CHLORIDE 0.9 % IJ SOLN
INTRAMUSCULAR | Status: AC
  Filled 2015-07-08: qty 10

## 2015-07-08 MED ORDER — ONDANSETRON HCL 4 MG/2ML IJ SOLN
INTRAMUSCULAR | Status: DC | PRN
  Administered 2015-07-08: 4 mg via INTRAVENOUS

## 2015-07-08 MED ORDER — LORAZEPAM 2 MG/ML IJ SOLN
INTRAMUSCULAR | Status: AC
  Filled 2015-07-08: qty 1

## 2015-07-08 MED ORDER — METHOCARBAMOL 1000 MG/10ML IJ SOLN
500.0000 mg | Freq: Four times a day (QID) | INTRAVENOUS | Status: DC | PRN
  Filled 2015-07-08: qty 5

## 2015-07-08 MED ORDER — ROCURONIUM BROMIDE 50 MG/5ML IV SOLN
INTRAVENOUS | Status: AC
  Filled 2015-07-08: qty 1

## 2015-07-08 MED ORDER — FENTANYL CITRATE (PF) 250 MCG/5ML IJ SOLN
INTRAMUSCULAR | Status: AC
  Filled 2015-07-08: qty 5

## 2015-07-08 MED ORDER — CEFAZOLIN SODIUM-DEXTROSE 2-3 GM-% IV SOLR
2.0000 g | INTRAVENOUS | Status: AC
  Administered 2015-07-08: 2 g via INTRAVENOUS

## 2015-07-08 MED ORDER — OXYCODONE-ACETAMINOPHEN 10-325 MG PO TABS: 1.0000 | ORAL_TABLET | ORAL | Status: DC | PRN

## 2015-07-08 MED ORDER — INFLUENZA VAC SPLIT QUAD 0.5 ML IM SUSY
0.5000 mL | PREFILLED_SYRINGE | INTRAMUSCULAR | Status: AC
  Administered 2015-07-09: 0.5 mL via INTRAMUSCULAR
  Filled 2015-07-08: qty 0.5

## 2015-07-08 MED ORDER — BUPIVACAINE-EPINEPHRINE (PF) 0.5% -1:200000 IJ SOLN
INTRAMUSCULAR | Status: DC | PRN
  Administered 2015-07-08: 15 mL via PERINEURAL

## 2015-07-08 MED ORDER — CLONAZEPAM 0.5 MG PO TABS
0.5000 mg | ORAL_TABLET | Freq: Three times a day (TID) | ORAL | Status: DC | PRN
  Administered 2015-07-08: 0.5 mg via ORAL
  Filled 2015-07-08: qty 1

## 2015-07-08 MED ORDER — VITAMIN C 500 MG PO TABS
1000.0000 mg | ORAL_TABLET | Freq: Every day | ORAL | Status: DC
  Administered 2015-07-09: 1000 mg via ORAL
  Filled 2015-07-08: qty 2

## 2015-07-08 MED ORDER — ESCITALOPRAM OXALATE 20 MG PO TABS
20.0000 mg | ORAL_TABLET | Freq: Every day | ORAL | Status: DC
  Administered 2015-07-09: 20 mg via ORAL
  Filled 2015-07-08: qty 2
  Filled 2015-07-08 (×2): qty 1

## 2015-07-08 MED ORDER — EPHEDRINE SULFATE 50 MG/ML IJ SOLN
INTRAMUSCULAR | Status: DC | PRN
  Administered 2015-07-08 (×3): 10 mg via INTRAVENOUS

## 2015-07-08 MED ORDER — GLYCOPYRROLATE 0.2 MG/ML IJ SOLN
INTRAMUSCULAR | Status: AC
  Filled 2015-07-08: qty 1

## 2015-07-08 MED ORDER — LACTATED RINGERS IV SOLN
INTRAVENOUS | Status: DC | PRN
  Administered 2015-07-08: 10:00:00 via INTRAVENOUS

## 2015-07-08 MED ORDER — METHOCARBAMOL 500 MG PO TABS
500.0000 mg | ORAL_TABLET | Freq: Four times a day (QID) | ORAL | Status: DC | PRN
  Administered 2015-07-08: 500 mg via ORAL
  Filled 2015-07-08: qty 1

## 2015-07-08 MED ORDER — PROMETHAZINE HCL 25 MG RE SUPP: 12.5000 mg | Freq: Four times a day (QID) | RECTAL | Status: DC | PRN

## 2015-07-08 MED ORDER — ONDANSETRON HCL 4 MG/2ML IJ SOLN
INTRAMUSCULAR | Status: AC
  Filled 2015-07-08: qty 2

## 2015-07-08 MED ORDER — MORPHINE SULFATE (PF) 2 MG/ML IV SOLN
1.0000 mg | INTRAVENOUS | Status: DC | PRN
  Administered 2015-07-08: 1 mg via INTRAVENOUS
  Filled 2015-07-08: qty 1

## 2015-07-08 MED ORDER — ONDANSETRON HCL 4 MG/2ML IJ SOLN
4.0000 mg | Freq: Four times a day (QID) | INTRAMUSCULAR | Status: DC | PRN
  Administered 2015-07-08: 4 mg via INTRAVENOUS
  Filled 2015-07-08: qty 2

## 2015-07-08 MED ORDER — HYDROCODONE-ACETAMINOPHEN 10-325 MG PO TABS
1.0000 | ORAL_TABLET | ORAL | Status: DC | PRN
  Administered 2015-07-08: 2 via ORAL
  Administered 2015-07-09: 1 via ORAL
  Filled 2015-07-08: qty 1
  Filled 2015-07-08: qty 2

## 2015-07-08 MED ORDER — AMLODIPINE BESYLATE 10 MG PO TABS
10.0000 mg | ORAL_TABLET | Freq: Every day | ORAL | Status: DC
  Administered 2015-07-09: 10 mg via ORAL
  Filled 2015-07-08: qty 1

## 2015-07-08 MED ORDER — OXYBUTYNIN CHLORIDE ER 10 MG PO TB24
10.0000 mg | ORAL_TABLET | Freq: Every day | ORAL | Status: DC
  Administered 2015-07-08: 10 mg via ORAL
  Filled 2015-07-08: qty 1

## 2015-07-08 MED ORDER — NAPROXEN SODIUM 220 MG PO TABS: 220.0000 mg | ORAL_TABLET | Freq: Two times a day (BID) | ORAL | Status: DC | PRN

## 2015-07-08 MED ORDER — ONDANSETRON HCL 4 MG PO TABS: 4.0000 mg | ORAL_TABLET | Freq: Three times a day (TID) | ORAL | Status: DC | PRN

## 2015-07-08 MED ORDER — MIDAZOLAM HCL 5 MG/5ML IJ SOLN
INTRAMUSCULAR | Status: DC | PRN
  Administered 2015-07-08 (×2): .5 mg via INTRAVENOUS

## 2015-07-08 MED ORDER — CEFAZOLIN SODIUM-DEXTROSE 2-3 GM-% IV SOLR
INTRAVENOUS | Status: AC
  Filled 2015-07-08: qty 50

## 2015-07-08 MED ORDER — MIDAZOLAM HCL 2 MG/2ML IJ SOLN
INTRAMUSCULAR | Status: AC
  Filled 2015-07-08: qty 4

## 2015-07-08 MED ORDER — EPHEDRINE SULFATE 50 MG/ML IJ SOLN
INTRAMUSCULAR | Status: AC
  Filled 2015-07-08: qty 1

## 2015-07-08 SURGICAL SUPPLY — 72 items
BANDAGE ELASTIC 3 VELCRO ST LF (GAUZE/BANDAGES/DRESSINGS) ×2 IMPLANT
BANDAGE ELASTIC 4 VELCRO ST LF (GAUZE/BANDAGES/DRESSINGS) ×2 IMPLANT
BIT DRILL 2.2 SS TIBIAL (BIT) ×1 IMPLANT
BLADE SURG ROTATE 9660 (MISCELLANEOUS) IMPLANT
BNDG CMPR 9X4 STRL LF SNTH (GAUZE/BANDAGES/DRESSINGS) ×1
BNDG ESMARK 4X9 LF (GAUZE/BANDAGES/DRESSINGS) ×2 IMPLANT
BNDG GAUZE ELAST 4 BULKY (GAUZE/BANDAGES/DRESSINGS) ×2 IMPLANT
CORDS BIPOLAR (ELECTRODE) ×2 IMPLANT
COVER SURGICAL LIGHT HANDLE (MISCELLANEOUS) ×2 IMPLANT
CUFF TOURNIQUET SINGLE 18IN (TOURNIQUET CUFF) ×2 IMPLANT
CUFF TOURNIQUET SINGLE 24IN (TOURNIQUET CUFF) IMPLANT
DRAIN TLS ROUND 10FR (DRAIN) IMPLANT
DRAPE OEC MINIVIEW 54X84 (DRAPES) ×2 IMPLANT
DRAPE SURG 17X11 SM STRL (DRAPES) ×2 IMPLANT
DRSG ADAPTIC 3X8 NADH LF (GAUZE/BANDAGES/DRESSINGS) ×2 IMPLANT
ELECT REM PT RETURN 9FT ADLT (ELECTROSURGICAL)
ELECTRODE REM PT RTRN 9FT ADLT (ELECTROSURGICAL) IMPLANT
GAUZE SPONGE 4X4 12PLY STRL (GAUZE/BANDAGES/DRESSINGS) ×2 IMPLANT
GLOVE BIOGEL PI IND STRL 8.5 (GLOVE) ×1 IMPLANT
GLOVE BIOGEL PI INDICATOR 8.5 (GLOVE) ×1
GLOVE SURG ORTHO 8.0 STRL STRW (GLOVE) ×2 IMPLANT
GOWN STRL REUS W/ TWL LRG LVL3 (GOWN DISPOSABLE) ×3 IMPLANT
GOWN STRL REUS W/ TWL XL LVL3 (GOWN DISPOSABLE) ×1 IMPLANT
GOWN STRL REUS W/TWL LRG LVL3 (GOWN DISPOSABLE) ×6
GOWN STRL REUS W/TWL XL LVL3 (GOWN DISPOSABLE) ×2
K-WIRE 1.6 (WIRE) ×2
K-WIRE FX5X1.6XNS BN SS (WIRE) ×1
KIT BASIN OR (CUSTOM PROCEDURE TRAY) ×2 IMPLANT
KIT ROOM TURNOVER OR (KITS) ×2 IMPLANT
KWIRE FX5X1.6XNS BN SS (WIRE) IMPLANT
MANIFOLD NEPTUNE II (INSTRUMENTS) ×1 IMPLANT
NDL HYPO 25X1 1.5 SAFETY (NEEDLE) ×1 IMPLANT
NEEDLE HYPO 25X1 1.5 SAFETY (NEEDLE) IMPLANT
NS IRRIG 1000ML POUR BTL (IV SOLUTION) ×2 IMPLANT
PACK ORTHO EXTREMITY (CUSTOM PROCEDURE TRAY) ×2 IMPLANT
PAD ARMBOARD 7.5X6 YLW CONV (MISCELLANEOUS) ×4 IMPLANT
PAD CAST 4YDX4 CTTN HI CHSV (CAST SUPPLIES) ×1 IMPLANT
PADDING CAST ABS 3INX4YD NS (CAST SUPPLIES) ×1
PADDING CAST ABS COTTON 3X4 (CAST SUPPLIES) IMPLANT
PADDING CAST COTTON 4X4 STRL (CAST SUPPLIES)
PEG LOCKING SMOOTH 2.2X20 (Screw) ×2 IMPLANT
PEG LOCKING SMOOTH 2.2X22 (Screw) ×2 IMPLANT
PLATE STANDARD DVR LEFT (Plate) ×2 IMPLANT
PLATE STD DVR LT 24X51 (Plate) IMPLANT
PUTTY DBM STAGRAFT PLUS 2CC (Putty) ×1 IMPLANT
SCREW LOCK 12X2.7X 3 LD (Screw) IMPLANT
SCREW LOCK 14X2.7X 3 LD TPR (Screw) IMPLANT
SCREW LOCK 16X2.7X 3 LD TPR (Screw) IMPLANT
SCREW LOCK 24X2.7X3 LD THRD (Screw) IMPLANT
SCREW LOCKING 2.7X12MM (Screw) ×4 IMPLANT
SCREW LOCKING 2.7X14 (Screw) ×2 IMPLANT
SCREW LOCKING 2.7X15MM (Screw) ×1 IMPLANT
SCREW LOCKING 2.7X16 (Screw) ×2 IMPLANT
SCREW LOCKING 2.7X24MM (Screw) ×4 IMPLANT
SCREW MULTI DIRECTIONAL 2.7X20 (Screw) ×1 IMPLANT
SOAP 2 % CHG 4 OZ (WOUND CARE) ×2 IMPLANT
SPLINT FIBERGLASS 3X35 (CAST SUPPLIES) ×1 IMPLANT
SUCTION FRAZIER TIP 10 FR DISP (SUCTIONS) ×1 IMPLANT
SUT PROLENE 4 0 PS 2 18 (SUTURE) IMPLANT
SUT VIC AB 2-0 FS1 27 (SUTURE) IMPLANT
SUT VIC AB 2-0 SH 27 (SUTURE) ×2
SUT VIC AB 2-0 SH 27X BRD (SUTURE) IMPLANT
SUT VIC AB 4-0 P-3 18X BRD (SUTURE) IMPLANT
SUT VIC AB 4-0 P3 18 (SUTURE) ×2
SUT VICRYL 4-0 PS2 18IN ABS (SUTURE) IMPLANT
SUT VICRYL RAPIDE 4/0 PS 2 (SUTURE) ×1 IMPLANT
SYR CONTROL 10ML LL (SYRINGE) IMPLANT
SYSTEM CHEST DRAIN TLS 7FR (DRAIN) IMPLANT
TOWEL OR 17X24 6PK STRL BLUE (TOWEL DISPOSABLE) ×2 IMPLANT
TOWEL OR 17X26 10 PK STRL BLUE (TOWEL DISPOSABLE) ×2 IMPLANT
TUBE CONNECTING 12X1/4 (SUCTIONS) ×2 IMPLANT
WATER STERILE IRR 1000ML POUR (IV SOLUTION) ×2 IMPLANT

## 2015-07-08 NOTE — Discharge Instructions (Signed)
KEEP BANDAGE CLEAN AND DRY CALL OFFICE FOR F/U APPT [phone] in 15 days KEEP HAND ELEVATED ABOVE HEART OK TO APPLY ICE TO OPERATIVE AREA CONTACT OFFICE IF ANY WORSENING PAIN OR CONCERNS.  

## 2015-07-08 NOTE — Transfer of Care (Signed)
Immediate Anesthesia Transfer of Care Note  Patient: [name]  Procedure(s) Performed: Procedure(s): OPEN REDUCTION INTERNAL FIXATION (ORIF) LEFT WRIST FRACTURE AND REPAIR AS INDICATED (Left)  Patient Location: PACU  Anesthesia Type:General and Regional  Level of Consciousness: awake, alert , oriented and sedated  Airway & Oxygen Therapy: Patient Spontanous Breathing and Patient connected to nasal cannula oxygen  Post-op Assessment: Report given to RN, Post -op Vital signs reviewed and stable and Patient moving all extremities  Post vital signs: Reviewed and stable  Last Vitals:  Filed Vitals:   07/08/15 1203  BP: 110/61  Pulse: 70  Temp:   Resp: 13    Complications: No apparent anesthesia complications

## 2015-07-08 NOTE — Anesthesia Procedure Notes (Addendum)
Anesthesia Regional Block:  Supraclavicular block  Pre-Anesthetic Checklist: ,, timeout performed, Correct Patient, Correct Site, Correct Laterality, Correct Procedure, Correct Position, site marked, Risks and benefits discussed,  Surgical consent,  Pre-op evaluation,  At surgeon's request and post-op pain management  Laterality: Left and Upper  Prep: chloraprep       Needles:   Needle Type: Echogenic Stimulator Needle     Needle Length: 9cm 9 cm Needle Gauge: 21 and 21 G  Needle insertion depth: 5 cm   Additional Needles:  Procedures: ultrasound guided (picture in chart) and nerve stimulator Supraclavicular block Narrative:  Start time: 07/08/2015 9:55 AM End time: 07/08/2015 10:10 AM Injection made incrementally with aspirations every 5 mL.  Performed by: Personally  Anesthesiologist: M[place], TERRY  Additional Notes: Tolerated well. Then had shaking episode . Almost looked like mini seizure but then abated. Spoke c son who says this happens time to time and usually resolves , which it did. VSS, she remained coherent. Plan to proceed .  Discussed c son.   Procedure Name: LMA Insertion Date/Time: 07/08/2015 10:29 AM Performed by: Scheryl Darter Pre-anesthesia Checklist: Patient identified, Emergency Drugs available, Suction available, Patient being monitored and Timeout performed Patient Re-evaluated:Patient Re-evaluated prior to inductionOxygen Delivery Method: Circle system utilized Preoxygenation: Pre-oxygenation with 100% oxygen Intubation Type: IV induction Ventilation: Mask ventilation without difficulty LMA: LMA inserted LMA Size: 4.0 Number of attempts: 1 Placement Confirmation: positive ETCO2 and breath sounds checked- equal and bilateral Tube secured with: Tape Dental Injury: Teeth and Oropharynx as per pre-operative assessment

## 2015-07-08 NOTE — Progress Notes (Signed)
Orthopedic Tech Progress Note Patient Details:  [name] 02/17/1946 [id number]  Ortho Devices Type of Ortho Device: Arm sling Ortho Device/Splint Location: LUE Ortho Device/Splint Interventions: Ordered, Application   Braulio Bosch 07/08/2015, 3:32 PM

## 2015-07-08 NOTE — Evaluation (Signed)
Physical Therapy Evaluation Patient Details Name: [name] MRN: [id number] DOB: Feb 02, 1946 Today's Date: 07/08/2015   History of Present Illness  69 y.o. female s/p OPEN REDUCTION INTERNAL FIXATION (ORIF) LEFT WRIST FRACTURE  with release and lengthening of brachioradialis tendon. Hx of tremors, anxiety, breast CA, seizures, and stroke.  Clinical Impression  Patient is s/p above surgery presenting with functional limitations due to the deficits listed below (see PT Problem List). Demonstrates good tolerance to higher level dynamic gait tasks without loss of balance. Towards end of ambulatory bout pt became increasingly tremulous and required hand held support; this resolved with cues for deep breathing and relaxation techniques; she did not experience syncope or buckling of LEs. Denies any falls in the past year other than the fall which resulted in her fracture when she was hit [place] a [place]. States she plans to work out a supervision schedule between her son and friend to assist her at home. Patient will benefit from skilled PT to increase their independence and safety with mobility to allow discharge to the venue listed below.       Follow Up Recommendations No PT follow up;Supervision - Intermittent    Equipment Recommendations  None recommended by PT    Recommendations for Other Services OT consult     Precautions / Restrictions Precautions Precautions: Fall Required Braces or Orthoses: Sling      Mobility  Bed Mobility               General bed mobility comments: in recliner  Transfers Overall transfer level: Independent               General transfer comment: Stood up and down several times as PT entered room. No loss of balance noted.  Ambulation/Gait Ambulation/Gait assistance: Min assist Ambulation Distance (Feet): 110 Feet Assistive device: None;1 person hand held assist Gait Pattern/deviations: Step-through pattern Gait  velocity: decreased Gait velocity interpretation: Below normal speed for age/gender General Gait Details: Tolerated majority of bout at a supervision level including higher level dynamic gait tasks including side stepping LT/RT, high marches, turns, backwards stepping, variable speeds, and horizontal/vertical head turns. No loss of balance with these tasks although pt appears less confident. At end of distance pt became increasingly tremulous and reached for PTs hand for support without syncope or LE buckling. Assisted pt with hand held assist back to recliner. Improved symptoms with cues for deep breathing and relaxation techniques.  Stairs            Wheelchair Mobility    Modified Rankin (Stroke Patients Only)       Balance Overall balance assessment: Needs assistance Sitting-balance support: No upper extremity supported;Feet supported Sitting balance-Leahy Scale: Normal     Standing balance support: No upper extremity supported Standing balance-Leahy Scale: Good                               Pertinent Vitals/Pain Pain Assessment: 0-10 Pain Score:  ("hurts a little but it's numb" no value given) Pain Location: LUE Pain Descriptors / Indicators: Sore Pain Intervention(s): Monitored during session;Repositioned;Premedicated before session;Limited activity within patient's tolerance    Home Living Family/patient expects to be discharged to:: Private residence Living Arrangements: Alone Available Help at Discharge: Family;Friend(s);Available PRN/intermittently Type of Home: Apartment (retirement community) Home Access: Level entry     Home Layout: Multi-level Home Equipment: Cane - single point;Walker - 2 wheels  Prior Function Level of Independence: Independent               Hand Dominance        Extremity/Trunk Assessment   Upper Extremity Assessment: Defer to OT evaluation           Lower Extremity Assessment: Overall WFL for  tasks assessed         Communication   Communication: No difficulties  Cognition Arousal/Alertness: Awake/alert Behavior During Therapy: WFL for tasks assessed/performed Overall Cognitive Status: Within Functional Limits for tasks assessed                      General Comments General comments (skin integrity, edema, etc.): States she feels off balance frequently but has not had a fall all year until she was [place] caused her fracture.    Exercises        Assessment/Plan    PT Assessment Patient needs continued PT services  PT Diagnosis Abnormality of gait;Acute pain   PT Problem List Decreased strength;Decreased range of motion;Decreased activity tolerance;Decreased balance;Decreased mobility;Decreased knowledge of use of DME;Impaired sensation;Pain;Obesity  PT Treatment Interventions DME instruction;Gait training;Functional mobility training;Therapeutic activities;Therapeutic exercise;Balance training;Patient/family education   PT Goals (Current goals can be found in the Care Plan section) Acute Rehab PT Goals Patient Stated Goal: To be okay PT Goal Formulation: With patient Time For Goal Achievement: 07/15/15 Potential to Achieve Goals: Good    Frequency Min 5X/week   Barriers to discharge Decreased caregiver support son works. She states she is attempting to arrange family and friend supervision for the first few days while she is at home.    Co-evaluation               End of Session   Activity Tolerance: Patient tolerated treatment well Patient left: in chair;with call bell/phone within reach Nurse Communication: Mobility status    Functional Assessment Tool Used: clinical observation Functional Limitation: Mobility: Walking and moving around Mobility: Walking and Moving Around Current Status [id number]): At least 1 percent but less than 20 percent impaired, limited or restricted Mobility: Walking and Moving Around Goal  Status [phone]): At least 1 percent but less than 20 percent impaired, limited or restricted    Time: 1736-1758 PT Time Calculation (min) (ACUTE ONLY): 22 min   Charges:   PT Evaluation $Initial PT Evaluation Tier I: 1 Procedure     PT G Codes:   PT G-Codes **NOT FOR INPATIENT CLASS** Functional Assessment Tool Used: clinical observation Functional Limitation: Mobility: Walking and moving around Mobility: Walking and Moving Around Current Status [id number]): At least 1 percent but less than 20 percent impaired, limited or restricted Mobility: Walking and Moving Around Goal Status [phone]): At least 1 percent but less than 20 percent impaired, limited or restricted    Ellouise Newer 07/08/2015, [place] PM  Camille Bal Switzer, [place]

## 2015-07-08 NOTE — Brief Op Note (Signed)
07/08/2015  9:54 AM  PATIENT:  [name]  69 y.o. female  PRE-OPERATIVE DIAGNOSIS:  LEFT WRIST INTRA-ARTICULAR DISTAL RADIUS FRACTURE  POST-OPERATIVE DIAGNOSIS:  LEFT WRIST INTRA-ARTICULAR DISTAL RADIUS FRACTURE  PROCEDURE:  Procedure(s): OPEN REDUCTION INTERNAL FIXATION (ORIF) LEFT WRIST FRACTURE AND REPAIR AS INDICATED (Left)  SURGEON:  Surgeon(s) and Role:    * Iran Planas, MD - Primary  PHYSICIAN ASSISTANT:   ASSISTANTS: none   ANESTHESIA:   general  EBL:     BLOOD ADMINISTERED:none  DRAINS: none   LOCAL MEDICATIONS USED:  NONE  SPECIMEN:  No Specimen  DISPOSITION OF SPECIMEN:  N/A  COUNTS:  YES  TOURNIQUET:  * Missing tourniquet times found for documented tourniquets in log:  [id number] *  DICTATION: .Other Dictation: Dictation Number [id number]  PLAN OF CARE: Discharge to home after PACU  PATIENT DISPOSITION:  PACU - hemodynamically stable.   Delay start of Pharmacological VTE agent (>24hrs) due to surgical blood loss or risk of bleeding: not applicable

## 2015-07-08 NOTE — Transfer of Care (Signed)
Immediate Anesthesia Transfer of Care Note  Patient: [name]  Procedure(s) Performed: Procedure(s): OPEN REDUCTION INTERNAL FIXATION (ORIF) LEFT WRIST FRACTURE AND REPAIR AS INDICATED (Left)  Patient Location: PACU  Anesthesia Type:General and Regional  Level of Consciousness: awake, alert , oriented and sedated  Airway & Oxygen Therapy: Patient Spontanous Breathing and Patient connected to nasal cannula oxygen  Post-op Assessment: Report given to RN, Post -op Vital signs reviewed and stable and Patient moving all extremities  Post vital signs: Reviewed and stable  Last Vitals:  Filed Vitals:   07/08/15 0836  BP: 123/64  Pulse: 54  Temp: 37.1 C  Resp: 18    Complications: No apparent anesthesia complications

## 2015-07-08 NOTE — Anesthesia Postprocedure Evaluation (Signed)
  Anesthesia Post-op Note  Patient: [name]  Procedure(s) Performed: Procedure(s): OPEN REDUCTION INTERNAL FIXATION (ORIF) LEFT WRIST FRACTURE AND REPAIR AS INDICATED (Left)  Patient Location: PACU  Anesthesia Type:GA combined with regional for post-op pain  Level of Consciousness: awake and alert   Airway and Oxygen Therapy: Patient Spontanous Breathing  Post-op Pain: none  Post-op Assessment: Post-op Vital signs reviewed              Post-op Vital Signs: stable  Last Vitals:  Filed Vitals:   07/08/15 1305  BP: 120/71  Pulse: 69  Temp: 36.6 C  Resp: 18    Complications: No apparent anesthesia complications

## 2015-07-08 NOTE — Op Note (Signed)
NAMEJORDAN, [name] NO.:  [id number]  MEDICAL RECORD NO.:  [id number]  LOCATION:  [id number]                        FACILITY:  [facility]  PHYSICIAN:  Linna Hoff IV, M.D.DATE OF BIRTH:  1946-06-16  DATE OF PROCEDURE:  07/08/2015 DATE OF DISCHARGE:                              OPERATIVE REPORT   PREOPERATIVE DIAGNOSIS:  Left wrist intra-articular distal radius fracture 3 or more fragments.  POSTOPERATIVE DIAGNOSIS:  Left wrist intra-articular distal radius fracture 3 or more fragments.  ATTENDING PHYSICIAN:  Linna Hoff, M.D. scrubbed and present for the entire procedure.  ASSISTANT SURGEON:  None.  ANESTHESIA:  General via LMA with supraclavicular block.  PROCEDURE: 1. Open treatment of left wrist intra-articular distal radius fracture     3 or more fragments. 2. Left wrist brachioradialis tendon release and lengthening. 3. Left wrist radiographs 3-views.  SURGICAL IMPLANTS:  Biomet DVR Crosslock with 2 mL of StaGraft, bone graft substitute.  RADIOGRAPHIC INTERPRETATION:  AP and lateral views of the wrist did show a volar plate fixation in good position.  SURGICAL INDICATIONS:  [name] is a right-hand-dominant female who sustained comminuted intra-articular distal radius fracture.  The patient was seen and evaluated in the office and recommended to undergo the above procedure.  Risks, benefits, and alternatives were discussed in detail with the patient.  Signed informed consent was obtained. Risks include, but not limited to bleeding, infection, damage to nearby nerves, arteries, or tendons, loss of motion of wrist and digit, incomplete relief of symptoms, and need for further surgical intervention.  DESCRIPTION OF PROCEDURE:  The patient was identified in the preoperative holding area and marked with a permanent marker made on the left wrist to indicate correct operative site.  The patient was then brought back to the operating room, placed  supine on the anesthesia table.  General anesthesia was administered.  The patient tolerated this well.  A well-padded tourniquet placed on left brachium, sealed with a 1000 drape.  The left upper extremity was then prepped and draped in normal sterile fashion.  Time-out was called.  Correct site was identified, and procedure begun.  Attention then turned to the left wrist.  A longitudinal incision made directly over the FCR sheath. Dissection was carried down through the skin and subcutaneous tissue. The FCR sheath was opened.  The FPL was carefully swept all the way. Pronator quadratus was then elevated in an L-shaped fashion.  The fracture site was exposed.  This was an intra-articular fracture of 3 or more fragments.  The patient did have significant amount of comminution along the radial column and dorsally 2 mL biological StaGraft was then placed in the metaphyseal defect.  This elevated the dorsal comminution. The wound was then thoroughly irrigated.  The volar plate was then applied and held this with a K-wire.  The oblong screw hole was then used proximally and the plate height was then adjusted.  Distal fixation was then carried out with the combination of locking screws, intact. Proximal fixation then carried out with locking pegs and screws.  The wound was then irrigated.  The final fixation was then carried down in the shaft with combination of locking  and nonlocking screws.  The pronator quadratus was closed with 2-0 Vicryl.  Subcutaneous tissues closed with 4-0 Vicryl and skin was closed with 4-0 Vicryl Rapide. Adaptic dressing and sterile compressive bandage then applied.  The patient placed in a well-padded sugar-tong splint, extubated, and taken to recovery room in good condition.  POSTPROCEDURE PLAN:  The patient admitted overnight for IV antibiotics and pain control.  Discharge in the morning.  Seen back in the office in approximately 2 weeks for wound check, suture  removal, x-rays, application of short-arm cast and begin a total immobilization for 4 weeks and begin an outpatient therapy regimen in a 4-week mark.     [name], M.D.     FWO/MEDQ  D:  07/08/2015  T:  07/08/2015  Job:  [id number]

## 2015-07-08 NOTE — Discharge Summary (Signed)
Physician Discharge Summary  Patient ID: [name] MRN: [id number] DOB/AGE: 02/11/1946 69 y.o.  Admit date: 07/08/2015 Discharge date: 07/09/2015  Admission Diagnoses: LEFT WRIST INTRA-ARTICULAR DISTAL RADIUS FRACTURE Past Medical History  Diagnosis Date  . Tremor   . Bowel incontinence   . Bladder incontinence   . High blood pressure   . Anemia   . Depression   . Arthritis   . Cancer [facility])     breast cancer  . GERD (gastroesophageal reflux disease)   . Full dentures   . HOH (hard of hearing)   . Complication of anesthesia     hard to wake up  . Anxiety   . Chronic diarrhea   . Breast cancer ([place]) 08/16/13    left, 1 o'clock  . History of radiation therapy 11/02/13- 12/24/13    left breast 4680 cGy in 26 sessions, left breast boost 1400 cGy in 7 sessions  . Stroke ([place]) 1980    legs weak  . Sleep apnea     has a cpap-5 hr/ not used in years  . Pneumonia     hx  . Hypothyroidism   . Urinary incontinence   . Shortness of breath     when walks  . Seizures ([place])     no meds    Discharge Diagnoses:  Active Problems:   Distal radius fracture, left   Surgeries: Procedure(s): OPEN REDUCTION INTERNAL FIXATION (ORIF) LEFT WRIST FRACTURE AND REPAIR AS INDICATED on 07/08/2015    Consultants:    Discharged Condition: Improved  Hospital Course: [name] is an 69 y.o. female who was admitted 07/08/2015 with a chief complaint of No chief complaint on file. , and found to have a diagnosis of LEFT WRIST INTRA-ARTICULAR DISTAL RADIUS FRACTURE.  They were brought to the operating room on 07/08/2015 and underwent Procedure(s): OPEN REDUCTION INTERNAL FIXATION (ORIF) LEFT WRIST FRACTURE AND REPAIR AS INDICATED.    They were given perioperative antibiotics: Anti-infectives    Start     Dose/Rate Route Frequency Ordered Stop   07/08/15 2100  ceFAZolin (ANCEF) IVPB 1 g/50 mL premix     1 g 100 mL/hr over 30 Minutes Intravenous Every 8 hours 07/08/15 1335     07/08/15  1247  ceFAZolin (ANCEF) 1-5 GM-% IVPB    Comments:  Orie Fisherman   : cabinet override      07/08/15 1247 07/09/15 0059   07/08/15 1215  ceFAZolin (ANCEF) IVPB 1 g/50 mL premix     1 g 100 mL/hr over 30 Minutes Intravenous NOW 07/08/15 1207 07/08/15 1320   07/08/15 0948  ceFAZolin (ANCEF) 2-3 GM-% IVPB SOLR    Comments:  Henrine Screws   : cabinet override      07/08/15 0948 07/08/15 2159   07/08/15 0843  ceFAZolin (ANCEF) IVPB 2 g/50 mL premix     2 g 100 mL/hr over 30 Minutes Intravenous On call to O.R. 07/08/15 [id number] 07/08/15 1033    .  They were given sequential compression devices, early ambulation, and Other (comment) for DVT prophylaxis.  Recent vital signs: Patient Vitals for the past 24 hrs:  BP Temp Temp src Pulse Resp SpO2 Height Weight  07/08/15 1335 123/68 mmHg 97.5 F (36.4 C) Oral 86 16 95 % - -  07/08/15 1305 120/71 mmHg 97.8 F (36.6 C) - 69 18 91 % - -  07/08/15 1248 96/77 mmHg - - 67 16 96 % - -  07/08/15 1233 (!) 96/54 mmHg - - 69  13 92 % - -  07/08/15 1218 (!) 94/38 mmHg - - 69 17 96 % - -  07/08/15 1203 110/61 mmHg 97 F (36.1 C) - 70 13 92 % - -  07/08/15 0836 123/64 mmHg 98.7 F (37.1 C) Oral (!) 54 18 95 % 5\' 3"  (1.6 m) 110.859 kg (244 lb 6.4 oz)  .  Recent laboratory studies: No results found.  Discharge Medications:     Medication List    TAKE these medications        amLODipine 5 MG tablet  Commonly known as:  NORVASC  Take 10 mg by mouth daily.     atenolol 25 MG tablet  Commonly known as:  TENORMIN  Take 25 mg by mouth at bedtime.     cholecalciferol 1000 UNITS tablet  Commonly known as:  VITAMIN D  Take 1,000 Units by mouth daily.     citalopram 20 MG tablet  Commonly known as:  CELEXA  Take 1 tablet (20 mg total) by mouth daily.     clonazePAM 0.5 MG tablet  Commonly known as:  KLONOPIN  Take 1 tablet (0.5 mg total) by mouth 3 (three) times daily as needed for anxiety.     docusate sodium 100 MG capsule  Commonly known as:   COLACE  Take 1 capsule (100 mg total) by mouth 2 (two) times daily.     escitalopram 20 MG tablet  Commonly known as:  LEXAPRO  Take 20 mg by mouth daily.     IMODIUM PO  Take 1 tablet by mouth daily as needed (diarrhea).     levothyroxine 50 MCG tablet  Commonly known as:  SYNTHROID, LEVOTHROID  Take 50 mcg by mouth daily before breakfast.     naproxen sodium 220 MG tablet  Commonly known as:  ANAPROX  Take 220 mg by mouth 2 (two) times daily as needed (pain).     nystatin cream  Commonly known as:  MYCOSTATIN  Apply 1 application topically 2 (two) times daily.     ondansetron 4 MG tablet  Commonly known as:  ZOFRAN  Take 1 tablet (4 mg total) by mouth every 8 (eight) hours as needed for nausea or vomiting.     oxybutynin 10 MG 24 hr tablet  Commonly known as:  DITROPAN-XL  Take 10 mg by mouth at bedtime.     oxyCODONE-acetaminophen 10-325 MG tablet  Commonly known as:  PERCOCET  Take 1 tablet by mouth every 4 (four) hours as needed for pain.     ranitidine 150 MG capsule  Commonly known as:  ZANTAC  Take 150 mg by mouth every evening.     vitamin C 500 MG tablet  Commonly known as:  ASCORBIC ACID  Take 1 tablet (500 mg total) by mouth daily.     ZYRTEC ALLERGY PO  Take 1 tablet by mouth at bedtime as needed (allergies).        Diagnostic Studies: No results found.  They benefited maximally from their hospital stay and there were no complications.     Disposition: 01-Home or Self Care     Signed: Linna Hoff 07/08/2015, 3:11 PM

## 2015-07-08 NOTE — Anesthesia Preprocedure Evaluation (Addendum)
Anesthesia Evaluation  Patient identified by MRN, date of birth, ID band Patient awake    Reviewed: Allergy & Precautions, H&P , NPO status   History of Anesthesia Complications (+) PROLONGED EMERGENCE and history of anesthetic complications  Airway Mallampati: II  TM Distance: >3 FB Neck ROM: Full    Dental  (+) Edentulous Upper   Pulmonary shortness of breath, sleep apnea ,    breath sounds clear to auscultation       Cardiovascular hypertension,  Rhythm:Regular Rate:Normal     Neuro/Psych Seizures -,  CVA    GI/Hepatic GERD  ,  Endo/Other  Morbid obesity  Renal/GU      Musculoskeletal  (+) Arthritis ,   Abdominal (+) + obese,   Peds  Hematology   Anesthesia Other Findings   Reproductive/Obstetrics                            Anesthesia Physical Anesthesia Plan  ASA: III  Anesthesia Plan: Regional   Post-op Pain Management:    Induction: Intravenous  Airway Management Planned: Natural Airway and LMA  Additional Equipment:   Intra-op Plan:   Post-operative Plan:   Informed Consent: I have reviewed the patients History and Physical, chart, labs and discussed the procedure including the risks, benefits and alternatives for the proposed anesthesia with the patient or authorized representative who has indicated his/her understanding and acceptance.     Plan Discussed with: CRNA and Surgeon  Anesthesia Plan Comments:        Anesthesia Quick Evaluation

## 2015-07-08 NOTE — H&P (Signed)
[name] is an 69 y.o. female.   Chief Complaint: left wrist injury after fall HPI: PT SUSTAINED CLOSED LEFT DISTAL RADIUS FRACTURE PT SEEN/EVALUATED IN OFFICE PT HERE FOR SURGERY NO PRIOR SURGERY TO LEFT WRIST PT C/O PAIN TO LEFT WRIST  Past Medical History  Diagnosis Date  . Tremor   . Bowel incontinence   . Bladder incontinence   . High blood pressure   . Anemia   . Depression   . Arthritis   . Cancer [facility])     breast cancer  . GERD (gastroesophageal reflux disease)   . Full dentures   . HOH (hard of hearing)   . Complication of anesthesia     hard to wake up  . Anxiety   . Chronic diarrhea   . Breast cancer ([place]) 08/16/13    left, 1 o'clock  . History of radiation therapy 11/02/13- 12/24/13    left breast 4680 cGy in 26 sessions, left breast boost 1400 cGy in 7 sessions  . Stroke ([place]) 1980    legs weak  . Sleep apnea     has a cpap-5 hr/ not used in years  . Pneumonia     hx  . Hypothyroidism   . Urinary incontinence   . Shortness of breath     when walks  . Seizures ([place])     no meds    Past Surgical History  Procedure Laterality Date  . Total hip arthroplasty  2005    rt total hip  . Abdominal hysterectomy    . Ovarian cysts    . Back surgery  2005    lumbar fusion  . Colonoscopy    . Multiple tooth extractions    . Breast lumpectomy with needle localization and axillary sentinel lymph node bx Left 09/29/2013    Procedure: BREAST LUMPECTOMY WITH NEEDLE LOCALIZATION AND AXILLARY SENTINEL LYMPH NODE BX;  Surgeon: [name], MD;  Location: [facility];  Service: General;  Laterality: Left;  clean wound class    Family History  Problem Relation Age of Onset  . Ataxia Neg Hx   . Chorea Neg Hx   . Dementia Neg Hx   . Mental retardation Neg Hx   . Migraines Neg Hx   . Multiple sclerosis Neg Hx   . Neurofibromatosis Neg Hx   . Neuropathy Neg Hx   . Parkinsonism Neg Hx   . Seizures Neg Hx   . Lung cancer Brother   . Stroke Mother     Social History:  reports that she has never smoked. She has never used smokeless tobacco. She reports that she does not drink alcohol or use illicit drugs.  Allergies:  Allergies  Allergen Reactions  . Codeine Nausea And Vomiting    Medications Prior to Admission  Medication Sig Dispense Refill  . amLODipine (NORVASC) 5 MG tablet Take 10 mg by mouth daily.     Marland Kitchen atenolol (TENORMIN) 25 MG tablet Take 25 mg by mouth at bedtime.     . Cetirizine HCl (ZYRTEC ALLERGY PO) Take 1 tablet by mouth at bedtime as needed (allergies).     . cholecalciferol (VITAMIN D) 1000 UNITS tablet Take 1,000 Units by mouth daily.    . clonazePAM (KLONOPIN) 0.5 MG tablet Take 1 tablet (0.5 mg total) by mouth 3 (three) times daily as needed for anxiety. (Patient taking differently: Take 1 mg by mouth 2 (two) times daily as needed for anxiety. ) 30 tablet 1  . escitalopram (  LEXAPRO) 20 MG tablet Take 20 mg by mouth daily.    Marland Kitchen levothyroxine (SYNTHROID, LEVOTHROID) 50 MCG tablet Take 50 mcg by mouth daily before breakfast.     . Loperamide HCl (IMODIUM PO) Take 1 tablet by mouth daily as needed (diarrhea).    . naproxen sodium (ANAPROX) 220 MG tablet Take 220 mg by mouth 2 (two) times daily as needed (pain).    . nystatin cream (MYCOSTATIN) Apply 1 application topically 2 (two) times daily. (Patient taking differently: Apply 1 application topically 2 (two) times daily as needed for dry skin. ) 30 g 0  . oxybutynin (DITROPAN-XL) 10 MG 24 hr tablet Take 10 mg by mouth at bedtime.    . ranitidine (ZANTAC) 150 MG capsule Take 150 mg by mouth every evening.    . citalopram (CELEXA) 20 MG tablet Take 1 tablet (20 mg total) by mouth daily. (Patient not taking: Reported on 07/05/2015) 30 tablet 1    Results for orders placed or performed during the hospital encounter of 07/07/15 (from the past 48 hour(s))  CBC     Status: Abnormal   Collection Time: 07/07/15 11:46 AM  Result Value Ref Range   WBC 5.8 4.0 - 10.5 K/uL    RBC 4.82 3.87 - 5.11 MIL/uL   Hemoglobin 15.2 (H) 12.0 - 15.0 g/dL   HCT 47.1 (H) 36.0 - 46.0 %   MCV 97.7 78.0 - 100.0 fL   MCH 31.5 26.0 - 34.0 pg   MCHC 32.3 30.0 - 36.0 g/dL   RDW 13.9 11.5 - 15.5 %   Platelets 136 (L) 150 - 400 K/uL  Basic metabolic panel     Status: Abnormal   Collection Time: 07/07/15 11:46 AM  Result Value Ref Range   Sodium 138 135 - 145 mmol/L   Potassium 3.9 3.5 - 5.1 mmol/L   Chloride 106 101 - 111 mmol/L   CO2 26 22 - 32 mmol/L   Glucose, Bld 101 (H) 65 - 99 mg/dL   BUN 18 6 - 20 mg/dL   Creatinine, Ser 0.97 0.44 - 1.00 mg/dL   Calcium 8.9 8.9 - 10.3 mg/dL   GFR calc non Af Amer 58 (L) >60 mL/min   GFR calc Af Amer >60 >60 mL/min    Comment: (NOTE) The eGFR has been calculated using the CKD EPI equation. This calculation has not been validated in all clinical situations. eGFR's persistently <60 mL/min signify possible Chronic Kidney Disease.    Anion gap 6 5 - 15   No results found.  [facility] RECENT ILLNESSES OR HOSPITALIZATIONS  Blood pressure 123/64, pulse 54, temperature 98.7 F (37.1 C), temperature source Oral, resp. rate 18, height 5' 3" (1.6 m), weight 110.859 kg (244 lb 6.4 oz), SpO2 95 %. Physical Exam  General Appearance:  Alert, cooperative, no distress, appears stated age  Head:  Normocephalic, without obvious abnormality, atraumatic  Eyes:  Pupils equal, conjunctiva/corneas clear,         Throat: Lips, mucosa, and tongue normal; teeth and gums normal  Neck: No visible masses     Lungs:   respirations unlabored  Chest Wall:  No tenderness or deformity  Heart:  Regular rate and rhythm,  Abdomen:   Soft, non-tender,         Extremities: LEFT WRIST: SKIN INTACT MARKED DEFORMITY TO WRIST FINGERS WARM WELL PERFUSED ABLE TO EXTEND THUMB AND WIGGLE FINGER  Pulses: 2+ and symmetric  Skin: Skin color, texture, turgor normal, no rashes or lesions  Neurologic: Normal   Assessment/Plan LEFT WRIST COMMINUTED INTRA-ARTICULAR  DISTAL RADIUS FRACTURE  LEFT WRIST OPEN REDUCTION AND INTERNAL FIXATION AND REPAIR AS INDICATED  R/B/A DISCUSSED WITH PT IN OFFICE.  PT VOICED UNDERSTANDING OF PLAN CONSENT SIGNED DAY OF SURGERY PT SEEN AND EXAMINED PRIOR TO OPERATIVE PROCEDURE/DAY OF SURGERY SITE MARKED. QUESTIONS ANSWERED WILL BE ADMITTED OVERNIGHT FOLLOWING SURGERY  WE ARE PLANNING SURGERY FOR YOUR UPPER EXTREMITY. THE RISKS AND BENEFITS OF SURGERY INCLUDE BUT NOT LIMITED TO BLEEDING INFECTION, DAMAGE TO NEARBY NERVES ARTERIES TENDONS, FAILURE OF SURGERY TO ACCOMPLISH ITS INTENDED GOALS, PERSISTENT SYMPTOMS AND NEED FOR FURTHER SURGICAL INTERVENTION. WITH THIS IN MIND WE WILL PROCEED. I HAVE DISCUSSED WITH THE PATIENT THE PRE AND POSTOPERATIVE REGIMEN AND THE DOS AND DON'TS. PT VOICED UNDERSTANDING AND INFORMED CONSENT SIGNED.  Linna Hoff 07/08/2015, 9:51 AM

## 2015-07-09 DIAGNOSIS — S52579A Other intraarticular fracture of lower end of unspecified radius, initial encounter for closed fracture: Secondary | ICD-10-CM | POA: Diagnosis not present

## 2015-07-09 NOTE — Progress Notes (Signed)
Patient discharged to son's home. Patient was awake and alert at time of discharge, with oxygen saturation of 90% on room air. Family assured RN that someone would be with patient 24 hours/day after discharge. Florian Buff, RN

## 2015-07-09 NOTE — Progress Notes (Signed)
Physical Therapy Treatment Patient Details Name: [name] MRN: [id number] DOB: [birth year] Today's Date: 07/09/2015    History of Present Illness 69 y.o. female s/p OPEN REDUCTION INTERNAL FIXATION (ORIF) LEFT WRIST FRACTURE  with release and lengthening of brachioradialis tendon. Hx of tremors, anxiety, breast CA, seizures, and stroke.    PT Comments    Patient with significant decrease in mobility status compared to initial evaluation. Current session limited to ambulation of 10 feet X2 with noted loss of balance with assistance to recover. SpO2 dropping with activity to 79% on room air, improving with application of O2 at 2L/min back to 99-100%. Patient very lethargic throughout session and needing repeated cues to orient to task and for safety with mobility. Discussed current mobility concerns with nursing after session. Will continue to follow to progress mobility and independence for anticipated D/C to home if able to progress as anticipated with physically and medically.   Follow Up Recommendations  No PT follow up;Supervision/Assistance - 24 hour;Other (comment) (once medically stable)     Equipment Recommendations  None recommended by PT    Recommendations for Other Services       Precautions / Restrictions Precautions Precautions: Fall Required Braces or Orthoses: Sling Restrictions Weight Bearing Restrictions: Yes LUE Weight Bearing: Non weight bearing    Mobility  Bed Mobility               General bed mobility comments: up in chair upon arrival  Transfers Overall transfer level: Needs assistance Equipment used: 1 person hand held assist Transfers: Sit to/from Stand Sit to Stand: Min assist Stand pivot transfers: Min assist       General transfer comment: sit/stand from recliner and toilet. Patient with loss of balance while attempting to sit on toilet with max assist to recover.   Ambulation/Gait Ambulation/Gait assistance: Min assist Ambulation  Distance (Feet): 20 Feet (10 feet X2) Assistive device: None (patient not alert enough to use SPC safely) Gait Pattern/deviations: Step-through pattern;Decreased step length - right;Decreased step length - left Gait velocity: decreased   General Gait Details: Unsteady pattern with ambulation, patient not alert enough to safely attempt use of SPC. Noted 1 full loss of balance with transfers.    Stairs            Wheelchair Mobility    Modified Rankin (Stroke Patients Only)       Balance Overall balance assessment: Needs assistance Sitting-balance support: No upper extremity supported Sitting balance-Leahy Scale: Good     Standing balance support: Single extremity supported Standing balance-Leahy Scale: Poor Standing balance comment: noted loss of balance and physical assistance needed.                     Cognition Arousal/Alertness: Lethargic Behavior During Therapy: Flat affect Overall Cognitive Status: Impaired/Different from baseline Area of Impairment: Safety/judgement;Memory               General Comments: forgetting where call light and phone were placed moments after reviewing, reorientation to task needed during session    Exercises      General Comments General comments (skin integrity, edema, etc.): Patient found with nasal cannula removed, SpO2 90%, attempted ambulation to bathroom (10 feet) and SpO2 decrease to 79%. Verbal cues needed for deep breathing to bring sats up to 88%. Ambulated back to chair and SpO2 again 81%. Oxygen applied at 3L/min and SpO2 incresed to 99-100%. Decreased to 2L/min. Discussed findings with nursing following session.  Pertinent Vitals/Pain Pain Assessment: 0-10 Pain Score: 5  Pain Location: Lt UE Pain Descriptors / Indicators: Sore Pain Intervention(s): Limited activity within patient's tolerance;Monitored during session;Repositioned    Home Living                      Prior Function             PT Goals (current goals can now be found in the care plan section) Acute Rehab PT Goals Patient Stated Goal: not sure really PT Goal Formulation: With patient Time For Goal Achievement: 07/15/15 Potential to Achieve Goals: Good Progress towards PT goals: Not progressing toward goals - comment (decreased mobility today)    Frequency  Min 5X/week    PT Plan Current plan remains appropriate    Co-evaluation             End of Session Equipment Utilized During Treatment: Gait belt Activity Tolerance: Patient limited by lethargy;Patient limited by fatigue;Other (comment) (Dropping SpO2. ) Patient left: in chair;with call bell/phone within reach;with chair alarm set;Other (comment) (oxygen)     Time: [id number] PT Time Calculation (min) (ACUTE ONLY): 28 min  Charges:  $Therapeutic Activity: 23-37 mins                    G Codes:      Cassell Clement, PT, CSCS Pager [phone] Office [phone]  07/09/2015, 1:36 PM

## 2015-07-09 NOTE — Evaluation (Signed)
Occupational Therapy Evaluation Patient Details Name: [name] MRN: [id number] DOB: [birth year] Today's Date: 07/09/2015    History of Present Illness 69 y.o. female s/p OPEN REDUCTION INTERNAL FIXATION (ORIF) LEFT WRIST FRACTURE  with release and lengthening of brachioradialis tendon. Hx of tremors, anxiety, breast CA, seizures, and stroke.   Clinical Impression   This 69 yo female admitted and underwent above presents to acute OT not doing near as well this AM as she did yesterday PM with PT. She was very sleepy/lethargic upon arrival (had benadryl and pain meds at 12:49 this morning), her O2 was 92% on 3 liters of O2 at rest supine. Upon getting on her feet at EOB after sitting for 5 minutes before moving, she had a posterior LOB I had to correct and then once we started trying to walk around the bed she took only 2 steps and said she had to sit down because she did not feel well (checked O2 since I had removed it to walk around bed and she was 85% on RA)--reapplied O2 at 3 liters and she came up to 93% with cues for purse lipped breathing. Brought recliner to pt. She reports that although she was managing at home prior to sx this is much worse and she does not feel she can manage herself now and I would have to agree based off her session with me this AM. [facility] SNF is recommend at this time for pt to get to a Mod I level to return home.    Follow Up Recommendations  SNF    Equipment Recommendations  3 in 1 bedside comode       Precautions / Restrictions Precautions Precautions: Fall Required Braces or Orthoses: Sling Restrictions Weight Bearing Restrictions: Yes LUE Weight Bearing: Non weight bearing      Mobility Bed Mobility Overal bed mobility: Needs Assistance Bed Mobility: Supine to Sit     Supine to sit: Supervision     General bed mobility comments: Pt reports she normally sleeps in recliner at home  Transfers Overall transfer level: Needs assistance Equipment  used: 1 person hand held assist Transfers: Sit to/from [facility] Sit to Stand: Min assist Stand pivot transfers: Min assist            Balance Overall balance assessment: Needs assistance Sitting-balance support: Feet supported;No upper extremity supported Sitting balance-Leahy Scale: Fair     Standing balance support: Single extremity supported Standing balance-Leahy Scale: Poor                              ADL Overall ADL's : Needs assistance/impaired Eating/Feeding: Set up;Sitting   Grooming: Moderate assistance (min A sit<>stand)   Upper Body Bathing: Moderate assistance;Sitting   Lower Body Bathing: Maximal assistance (Min A sit<>stand)   Upper Body Dressing : Maximal assistance;Sitting   Lower Body Dressing: Total assistance (min A sit<>stand)   Toilet Transfer: Minimal assistance;Stand-pivot;BSC Toilet Transfer Details (indicate cue type and reason): Attempted ambulation, but pt much too unsteady on feet. She took 2 steps and said she needed to sit down and I agree [place] and Hygiene: Total assistance (min A sit<>stand)               Vision Additional Comments: No change from baseline          Pertinent Vitals/Pain Pain Assessment: 0-10 Pain Score: 4  Pain Location: LUE Pain Descriptors / Indicators: Aching;Sore Pain Intervention(s): Monitored  during session;Repositioned;Ice applied (pt reports she does not want to take any pain meds right now since she is so sleepy)     Hand Dominance Right   Extremity/Trunk Assessment Upper Extremity Assessment Upper Extremity Assessment: LUE deficits/detail LUE Deficits / Details: miminal movement of fingers due to edema--encouraged her to keep her arm elevated and continue to try and move them (open/close hand). Has to use use her other arm to move her left arm due to pain and heaviness LUE Coordination: decreased fine motor;decreased gross motor            Communication Communication Communication: No difficulties   Cognition Arousal/Alertness: Lethargic (sleepy--depsite not having pain meds since 12:49 AM) Behavior During Therapy: Flat affect Overall Cognitive Status: Impaired/Different from baseline Area of Impairment: Problem solving;Safety/judgement         Safety/Judgement: Decreased awareness of safety   Problem Solving: Slow processing                Home Living Family/patient expects to be discharged to:: Skilled nursing facility Living Arrangements: Alone Available Help at Discharge: Family;Friend(s);Available PRN/intermittently Type of Home: Apartment Home Access: Level entry     Home Layout: Multi-level               Home Equipment: Cane - single point;Walker - 2 wheels;Walker - 4 wheels          Prior Functioning/Environment Level of Independence: Independent             OT Diagnosis: Generalized weakness;Acute pain   OT Problem List: Decreased strength;Decreased range of motion;Decreased activity tolerance;Impaired balance (sitting and/or standing);Pain;Decreased safety awareness;Impaired UE functional use;Obesity;Decreased knowledge of use of DME or AE;Increased edema   OT Treatment/Interventions: Self-care/ADL training;Patient/family education;Balance training;Therapeutic exercise;Therapeutic activities;DME and/or AE instruction    OT Goals(Current goals can be found in he care plan section) Acute Rehab OT Goals Patient Stated Goal: to not go home today--I cannot manage by myself OT Goal Formulation: With patient Time For Goal Achievement: 07/16/15 Potential to Achieve Goals: Good  OT Frequency: Min 3X/week   Barriers to D/C: Decreased caregiver support             End of Session Equipment Utilized During Treatment: Gait belt (sling) Nurse Communication:  (Pt reports she is nauseated, but thinks it is because she has not had a bowel movement in several days. She is lethargic.  Sats on RA as low as 85%. Do not feel she is safe to go home by herself)  Activity Tolerance: Patient limited by fatigue;Patient limited by lethargy;Patient limited by pain Patient left: in chair;with call bell/phone within reach;with chair alarm set   Time: [phone] OT Time Calculation (min): 48 min Charges:  OT General Charges $OT Visit: 1 Procedure OT Evaluation $Initial OT Evaluation Tier I: 1 Procedure OT Treatments $Self Care/Home Management : 23-37 mins G-Codes: OT G-codes **NOT FOR INPATIENT CLASS** Functional Assessment Tool Used: Clinical observation Functional Limitation: Self care Self Care Current Status [id number]): At least 80 percent but less than 100 percent impaired, limited or restricted Self Care Goal Status [id number]): At least 40 percent but less than 60 percent impaired, limited or restricted  Almon Register [id number] 07/09/2015, 9:15 AM

## 2015-07-10 ENCOUNTER — Encounter (HOSPITAL_COMMUNITY): Payer: Self-pay | Admitting: Orthopedic Surgery

## 2015-07-24 ENCOUNTER — Encounter (HOSPITAL_COMMUNITY): Payer: Self-pay

## 2015-07-24 ENCOUNTER — Emergency Department (HOSPITAL_COMMUNITY): Payer: Medicare (Managed Care)

## 2015-07-24 ENCOUNTER — Emergency Department (INDEPENDENT_AMBULATORY_CARE_PROVIDER_SITE_OTHER)
Admission: EM | Admit: 2015-07-24 | Discharge: 2015-07-24 | Disposition: A | Discharge status: Other Acute Inpatient Hospital | Payer: Medicare (Managed Care) | Source: Home / Self Care | Attending: Family Medicine | Admitting: Family Medicine

## 2015-07-24 ENCOUNTER — Inpatient Hospital Stay (HOSPITAL_COMMUNITY)
Admission: EM | Admit: 2015-07-24 | Discharge: 2015-08-01 | DRG: 872 | Disposition: A | Discharge status: Skilled Nursing Facility | Payer: Medicare (Managed Care) | Attending: Internal Medicine | Admitting: Internal Medicine

## 2015-07-24 DIAGNOSIS — C50912 Malignant neoplasm of unspecified site of left female breast: Secondary | ICD-10-CM | POA: Diagnosis present

## 2015-07-24 DIAGNOSIS — G4730 Sleep apnea, unspecified: Secondary | ICD-10-CM

## 2015-07-24 DIAGNOSIS — N390 Urinary tract infection, site not specified: Secondary | ICD-10-CM

## 2015-07-24 DIAGNOSIS — E039 Hypothyroidism, unspecified: Secondary | ICD-10-CM | POA: Diagnosis present

## 2015-07-24 DIAGNOSIS — R32 Unspecified urinary incontinence: Secondary | ICD-10-CM | POA: Diagnosis present

## 2015-07-24 DIAGNOSIS — E860 Dehydration: Secondary | ICD-10-CM | POA: Diagnosis not present

## 2015-07-24 DIAGNOSIS — A4151 Sepsis due to Escherichia coli [E. coli]: Principal | ICD-10-CM | POA: Diagnosis present

## 2015-07-24 DIAGNOSIS — Z8673 Personal history of transient ischemic attack (TIA), and cerebral infarction without residual deficits: Secondary | ICD-10-CM

## 2015-07-24 DIAGNOSIS — Z823 Family history of stroke: Secondary | ICD-10-CM

## 2015-07-24 DIAGNOSIS — F329 Major depressive disorder, single episode, unspecified: Secondary | ICD-10-CM | POA: Diagnosis present

## 2015-07-24 DIAGNOSIS — I10 Essential (primary) hypertension: Secondary | ICD-10-CM | POA: Diagnosis present

## 2015-07-24 DIAGNOSIS — R251 Tremor, unspecified: Secondary | ICD-10-CM | POA: Diagnosis present

## 2015-07-24 DIAGNOSIS — Z96641 Presence of right artificial hip joint: Secondary | ICD-10-CM | POA: Diagnosis present

## 2015-07-24 DIAGNOSIS — K219 Gastro-esophageal reflux disease without esophagitis: Secondary | ICD-10-CM | POA: Diagnosis present

## 2015-07-24 DIAGNOSIS — Z79899 Other long term (current) drug therapy: Secondary | ICD-10-CM

## 2015-07-24 DIAGNOSIS — D649 Anemia, unspecified: Secondary | ICD-10-CM | POA: Diagnosis present

## 2015-07-24 DIAGNOSIS — F419 Anxiety disorder, unspecified: Secondary | ICD-10-CM | POA: Diagnosis present

## 2015-07-24 DIAGNOSIS — A419 Sepsis, unspecified organism: Secondary | ICD-10-CM | POA: Diagnosis present

## 2015-07-24 DIAGNOSIS — D696 Thrombocytopenia, unspecified: Secondary | ICD-10-CM | POA: Diagnosis present

## 2015-07-24 DIAGNOSIS — Z885 Allergy status to narcotic agent status: Secondary | ICD-10-CM

## 2015-07-24 DIAGNOSIS — Z923 Personal history of irradiation: Secondary | ICD-10-CM

## 2015-07-24 DIAGNOSIS — Z981 Arthrodesis status: Secondary | ICD-10-CM

## 2015-07-24 DIAGNOSIS — H9190 Unspecified hearing loss, unspecified ear: Secondary | ICD-10-CM | POA: Diagnosis present

## 2015-07-24 DIAGNOSIS — R7881 Bacteremia: Secondary | ICD-10-CM | POA: Insufficient documentation

## 2015-07-24 DIAGNOSIS — S5292XD Unspecified fracture of left forearm, subsequent encounter for closed fracture with routine healing: Secondary | ICD-10-CM

## 2015-07-24 DIAGNOSIS — G4733 Obstructive sleep apnea (adult) (pediatric): Secondary | ICD-10-CM | POA: Diagnosis present

## 2015-07-24 LAB — URINE MICROSCOPIC-ADD ON

## 2015-07-24 LAB — URINALYSIS, ROUTINE W REFLEX MICROSCOPIC
BILIRUBIN URINE: NEGATIVE
Glucose, UA: NEGATIVE mg/dL
KETONES UR: NEGATIVE mg/dL
NITRITE: NEGATIVE
PROTEIN: 30 mg/dL — AB
Specific Gravity, Urine: 1.01 (ref 1.005–1.030)
pH: 6.5 (ref 5.0–8.0)

## 2015-07-24 LAB — CBC WITH DIFFERENTIAL/PLATELET
Basophils Absolute: 0 10*3/uL (ref 0.0–0.1)
Basophils Relative: 0 %
EOS ABS: 0 10*3/uL (ref 0.0–0.7)
EOS PCT: 0 %
HCT: 47.1 % — ABNORMAL HIGH (ref 36.0–46.0)
Hemoglobin: 15.3 g/dL — ABNORMAL HIGH (ref 12.0–15.0)
LYMPHS ABS: 0.8 10*3/uL (ref 0.7–4.0)
Lymphocytes Relative: 8 %
MCH: 31.8 pg (ref 26.0–34.0)
MCHC: 32.5 g/dL (ref 30.0–36.0)
MCV: 97.9 fL (ref 78.0–100.0)
Monocytes Absolute: 0.7 10*3/uL (ref 0.1–1.0)
Monocytes Relative: 8 %
Neutro Abs: 8.2 10*3/uL — ABNORMAL HIGH (ref 1.7–7.7)
Neutrophils Relative %: 84 %
PLATELETS: 130 10*3/uL — AB (ref 150–400)
RBC: 4.81 MIL/uL (ref 3.87–5.11)
RDW: 14.5 % (ref 11.5–15.5)
WBC: 9.7 10*3/uL (ref 4.0–10.5)

## 2015-07-24 LAB — I-STAT CHEM 8, ED
BUN: 14 mg/dL (ref 6–20)
CALCIUM ION: 1.06 mmol/L — AB (ref 1.13–1.30)
CHLORIDE: 99 mmol/L — AB (ref 101–111)
Creatinine, Ser: 1 mg/dL (ref 0.44–1.00)
Glucose, Bld: 138 mg/dL — ABNORMAL HIGH (ref 65–99)
HCT: 49 % — ABNORMAL HIGH (ref 36.0–46.0)
HEMOGLOBIN: 16.7 g/dL — AB (ref 12.0–15.0)
Potassium: 3.8 mmol/L (ref 3.5–5.1)
SODIUM: 140 mmol/L (ref 135–145)
TCO2: 28 mmol/L (ref 0–100)

## 2015-07-24 MED ORDER — SODIUM CHLORIDE 0.9 % IV BOLUS (SEPSIS)
250.0000 mL | Freq: Once | INTRAVENOUS | Status: AC
Start: 2015-07-24 — End: 2015-07-24
  Administered 2015-07-24: 250 mL via INTRAVENOUS

## 2015-07-24 MED ORDER — CLONAZEPAM 0.5 MG PO TABS
2.0000 mg | ORAL_TABLET | ORAL | Status: AC
Start: 2015-07-24 — End: 2015-07-24
  Administered 2015-07-24: 1 mg via ORAL
  Filled 2015-07-24: qty 4

## 2015-07-24 MED ORDER — ACETAMINOPHEN 500 MG PO TABS
1000.0000 mg | ORAL_TABLET | Freq: Once | ORAL | Status: AC
  Administered 2015-07-24: 1000 mg via ORAL
  Filled 2015-07-24: qty 2

## 2015-07-24 MED ORDER — CEFTRIAXONE SODIUM 1 G IJ SOLR
1.0000 g | Freq: Once | INTRAMUSCULAR | Status: AC
  Administered 2015-07-25: 1 g via INTRAVENOUS
  Filled 2015-07-24: qty 10

## 2015-07-24 NOTE — ED Notes (Signed)
C/o has felt badly since last night, cannot get her PCP to return her calls. C/o nauseated, vomiting at home. Reportedly prone to seizures

## 2015-07-24 NOTE — ED Notes (Signed)
Pt here from [facility] with c/o nausea/vomiting/diarrhea x 2 days. Pt also reports wewakness since today, and dehydration. A/o x 4.

## 2015-07-24 NOTE — ED Notes (Signed)
CN, advised of pending transfer to main ED for poss admission. [facility] called to transport to ED. Unable to establish IV w attempt x 2

## 2015-07-24 NOTE — ED Provider Notes (Signed)
CSN: [id number]     Arrival date & time 07/24/15  1923 History   First MD Initiated Contact with Patient 07/24/15 1958     Chief Complaint  Patient presents with  . Weakness  . Emesis  . Fever   (Consider location/radiation/quality/duration/timing/severity/associated sxs/prior Treatment) Patient is a 69 y.o. female presenting with weakness, vomiting, and fever. The history is provided by the patient.  Weakness This is a new problem. The current episode started 12 to 24 hours ago (has had flu shot this year, onset of n/v/d, fever chills myalgias .). The problem has been gradually worsening. Associated symptoms include abdominal pain.  Emesis Associated symptoms: abdominal pain, chills and diarrhea   Fever Associated symptoms: chills, diarrhea, nausea and vomiting     Past Medical History  Diagnosis Date  . Tremor   . Bowel incontinence   . Bladder incontinence   . High blood pressure   . Anemia   . Depression   . Arthritis   . Cancer [facility])     breast cancer  . GERD (gastroesophageal reflux disease)   . Full dentures   . HOH (hard of hearing)   . Complication of anesthesia     hard to wake up  . Anxiety   . Chronic diarrhea   . Breast cancer ([place]) 08/16/13    left, 1 o'clock  . History of radiation therapy 11/02/13- 12/24/13    left breast 4680 cGy in 26 sessions, left breast boost 1400 cGy in 7 sessions  . Stroke ([place]) 1980    legs weak  . Sleep apnea     has a cpap-5 hr/ not used in years  . Pneumonia     hx  . Hypothyroidism   . Urinary incontinence   . Shortness of breath     when walks  . Seizures ([place])     no meds   Past Surgical History  Procedure Laterality Date  . Total hip arthroplasty  2005    rt total hip  . Abdominal hysterectomy    . Ovarian cysts    . Back surgery  2005    lumbar fusion  . Colonoscopy    . Multiple tooth extractions    . Breast lumpectomy with needle localization and axillary sentinel lymph node bx Left 09/29/2013     Procedure: BREAST LUMPECTOMY WITH NEEDLE LOCALIZATION AND AXILLARY SENTINEL LYMPH NODE BX;  Surgeon: [name], MD;  Location: [place];  Service: General;  Laterality: Left;  clean wound class  . Orif wrist fracture Left 07/08/2015    Procedure: OPEN REDUCTION INTERNAL FIXATION (ORIF) LEFT WRIST FRACTURE AND REPAIR AS INDICATED;  Surgeon: Iran Planas, MD;  Location: [place];  Service: Orthopedics;  Laterality: Left;   Family History  Problem Relation Age of Onset  . Ataxia Neg Hx   . Chorea Neg Hx   . Dementia Neg Hx   . Mental retardation Neg Hx   . Migraines Neg Hx   . Multiple sclerosis Neg Hx   . Neurofibromatosis Neg Hx   . Neuropathy Neg Hx   . Parkinsonism Neg Hx   . Seizures Neg Hx   . Lung cancer Brother   . Stroke Mother    Social History  Substance Use Topics  . Smoking status: Never Smoker   . Smokeless tobacco: Never Used  . Alcohol Use: No   OB History    No data available     Review of Systems  Constitutional: Positive for fever  and chills.  Respiratory: Negative.   Cardiovascular: Positive for leg swelling.  Gastrointestinal: Positive for nausea, vomiting, abdominal pain and diarrhea. Negative for blood in stool.  Genitourinary: Positive for difficulty urinating.  Neurological: Positive for weakness.    Allergies  Codeine  Home Medications   Prior to Admission medications   Medication Sig Start Date End Date Taking? Authorizing Provider  amLODipine (NORVASC) 5 MG tablet Take 10 mg by mouth daily.  06/01/13   Historical Provider, MD  atenolol (TENORMIN) 25 MG tablet Take 25 mg by mouth at bedtime.     Historical Provider, MD  Cetirizine HCl (ZYRTEC ALLERGY PO) Take 1 tablet by mouth at bedtime as needed (allergies).     Historical Provider, MD  cholecalciferol (VITAMIN D) 1000 UNITS tablet Take 1,000 Units by mouth daily.    Historical Provider, MD  citalopram (CELEXA) 20 MG tablet Take 1 tablet (20 mg total) by mouth daily. Patient  not taking: Reported on 07/05/2015 03/21/15   Alvina Chou, PA-C  clonazePAM (KLONOPIN) 0.5 MG tablet Take 1 tablet (0.5 mg total) by mouth 3 (three) times daily as needed for anxiety. Patient taking differently: Take 1 mg by mouth 2 (two) times daily as needed for anxiety.  03/21/15   Kaitlyn Szekalski, PA-C  docusate sodium (COLACE) 100 MG capsule Take 1 capsule (100 mg total) by mouth 2 (two) times daily. 07/08/15   Iran Planas, MD  escitalopram (LEXAPRO) 20 MG tablet Take 20 mg by mouth daily.    Historical Provider, MD  levothyroxine (SYNTHROID, LEVOTHROID) 50 MCG tablet Take 50 mcg by mouth daily before breakfast.  06/28/13   Historical Provider, MD  Loperamide HCl (IMODIUM PO) Take 1 tablet by mouth daily as needed (diarrhea).    Historical Provider, MD  naproxen sodium (ANAPROX) 220 MG tablet Take 220 mg by mouth 2 (two) times daily as needed (pain).    Historical Provider, MD  nystatin cream (MYCOSTATIN) Apply 1 application topically 2 (two) times daily. Patient taking differently: Apply 1 application topically 2 (two) times daily as needed for dry skin.  06/21/14   Nicholas Lose, MD  ondansetron (ZOFRAN) 4 MG tablet Take 1 tablet (4 mg total) by mouth every 8 (eight) hours as needed for nausea or vomiting. 07/08/15   Iran Planas, MD  oxybutynin (DITROPAN-XL) 10 MG 24 hr tablet Take 10 mg by mouth at bedtime.    Historical Provider, MD  oxyCODONE-acetaminophen (PERCOCET) 10-325 MG tablet Take 1 tablet by mouth every 4 (four) hours as needed for pain. 07/08/15   Iran Planas, MD  ranitidine (ZANTAC) 150 MG capsule Take 150 mg by mouth every evening.    Historical Provider, MD  vitamin C (ASCORBIC ACID) 500 MG tablet Take 1 tablet (500 mg total) by mouth daily. 07/08/15   Iran Planas, MD   Meds Ordered and Administered this Visit  Medications - No data to display  BP 142/74 mmHg  Pulse 89  Temp(Src) 102.6 F (39.2 C) (Oral)  SpO2 89% No data found.   Physical Exam   Constitutional: She is oriented to person, place, and time. She appears distressed.  HENT:  Mouth/Throat: Mucous membranes are dry.  Neck: Normal range of motion. Neck supple.  Cardiovascular: Normal heart sounds.   Pulmonary/Chest: Breath sounds normal.  Abdominal: Soft. She exhibits no distension and no mass. Bowel sounds are decreased. There is generalized tenderness. There is no rigidity, no rebound, no guarding, no CVA tenderness, no tenderness at McBurney's point and negative Murphy's sign.  Lymphadenopathy:    She has no cervical adenopathy.  Neurological: She is alert and oriented to person, place, and time.  Skin: Skin is warm and dry.  Nursing note and vitals reviewed.   ED Course  Procedures (including critical care time)  Labs Review Labs Reviewed - No data to display  Imaging Review No results found.   Visual Acuity Review  Right Eye Distance:   Left Eye Distance:   Bilateral Distance:    Right Eye Near:   Left Eye Near:    Bilateral Near:         MDM   1. Dehydration syndrome        Billy Fischer, MD 07/24/15 2006

## 2015-07-24 NOTE — ED Provider Notes (Signed)
CSN: [id number]     Arrival date & time 07/24/15  2039 History   First MD Initiated Contact with Patient 07/24/15 2041     Chief Complaint  Patient presents with  . Diarrhea  . Emesis     (Consider location/radiation/quality/duration/timing/severity/associated sxs/prior Treatment) HPI Comments: This is a 69 year old female in poor physical condition who reports that she has chronic diarrhea.  She took Imodium 5 days ago with total resolution of her loose stools.  She also reports that she has chronic leaking of the urine.  She is due to have a bladder tack, but hasn't gotten around to it yet, but reports that for the past 5 days.  She's had dysuria.  Reports decreased appetite over the past 3 days.  Chills developed last night.  She has no appetite.  She does not have nausea, vomiting.  She feels very weak.  She called her primary care physician today, but was unable to get an appointment, so went to urgent care who transferred her here with "dehydration syndrome."  Patient is a 69 y.o. female presenting with diarrhea, vomiting, and dysuria. The history is provided by the patient.  Diarrhea Quality:  Unable to specify Onset quality:  Gradual Progression:  Resolved Relieved by:  Anti-motility medications Associated symptoms: chills and myalgias   Associated symptoms: no abdominal pain, no fever and no vomiting   Emesis Associated symptoms: chills, diarrhea and myalgias   Associated symptoms: no abdominal pain   Dysuria Pain quality:  Burning Pain severity:  Moderate Onset quality:  Gradual Duration:  5 days Timing:  Intermittent Progression:  Unchanged Chronicity:  New Recent urinary tract infections: no   Relieved by:  None tried Worsened by:  Nothing tried Ineffective treatments:  None tried Urinary symptoms: frequent urination   Associated symptoms: no abdominal pain, no fever, no flank pain, no nausea and no vomiting     Past Medical History  Diagnosis Date  . Tremor    . Bowel incontinence   . Bladder incontinence   . High blood pressure   . Anemia   . Depression   . Arthritis   . Cancer [facility])     breast cancer  . GERD (gastroesophageal reflux disease)   . Full dentures   . HOH (hard of hearing)   . Complication of anesthesia     hard to wake up  . Anxiety   . Chronic diarrhea   . Breast cancer ([place]) 08/16/13    left, 1 o'clock  . History of radiation therapy 11/02/13- 12/24/13    left breast 4680 cGy in 26 sessions, left breast boost 1400 cGy in 7 sessions  . Stroke ([place]) 1980    legs weak  . Sleep apnea     has a cpap-5 hr/ not used in years  . Pneumonia     hx  . Hypothyroidism   . Urinary incontinence   . Shortness of breath     when walks  . Seizures ([place])     no meds   Past Surgical History  Procedure Laterality Date  . Total hip arthroplasty  2005    rt total hip  . Abdominal hysterectomy    . Ovarian cysts    . Back surgery  2005    lumbar fusion  . Colonoscopy    . Multiple tooth extractions    . Breast lumpectomy with needle localization and axillary sentinel lymph node bx Left 09/29/2013    Procedure: BREAST LUMPECTOMY WITH NEEDLE LOCALIZATION AND AXILLARY  SENTINEL LYMPH NODE BX;  Surgeon: [name], MD;  Location: [facility];  Service: General;  Laterality: Left;  clean wound class  . Orif wrist fracture Left 07/08/2015    Procedure: OPEN REDUCTION INTERNAL FIXATION (ORIF) LEFT WRIST FRACTURE AND REPAIR AS INDICATED;  Surgeon: Iran Planas, MD;  Location: [place];  Service: Orthopedics;  Laterality: Left;   Family History  Problem Relation Age of Onset  . Ataxia Neg Hx   . Chorea Neg Hx   . Dementia Neg Hx   . Mental retardation Neg Hx   . Migraines Neg Hx   . Multiple sclerosis Neg Hx   . Neurofibromatosis Neg Hx   . Neuropathy Neg Hx   . Parkinsonism Neg Hx   . Seizures Neg Hx   . Lung cancer Brother   . Stroke Mother    Social History  Substance Use Topics  . Smoking status: Never Smoker    . Smokeless tobacco: Never Used  . Alcohol Use: No   OB History    No data available     Review of Systems  Constitutional: Positive for chills and appetite change. Negative for fever.  Respiratory: Negative for cough and shortness of breath.   Gastrointestinal: Positive for diarrhea. Negative for nausea, vomiting and abdominal pain.  Genitourinary: Positive for dysuria and frequency. Negative for flank pain.  Musculoskeletal: Positive for myalgias.      Allergies  Codeine  Home Medications   Prior to Admission medications   Medication Sig Start Date End Date Taking? Authorizing Provider  amLODipine (NORVASC) 5 MG tablet Take 10 mg by mouth daily.  06/01/13  Yes Historical Provider, MD  atenolol (TENORMIN) 25 MG tablet Take 25 mg by mouth at bedtime.    Yes Historical Provider, MD  Cetirizine HCl (ZYRTEC ALLERGY PO) Take 1 tablet by mouth at bedtime as needed (allergies).    Yes Historical Provider, MD  cholecalciferol (VITAMIN D) 1000 UNITS tablet Take 1,000 Units by mouth daily.   Yes Historical Provider, MD  clonazePAM (KLONOPIN) 2 MG tablet Take 1-2 mg by mouth See admin instructions. Patient states she takes 1mg  in the morning then she will take two of the 2mg  in the evening   Yes Historical Provider, MD  escitalopram (LEXAPRO) 20 MG tablet Take 20 mg by mouth daily.   Yes Historical Provider, MD  levothyroxine (SYNTHROID, LEVOTHROID) 50 MCG tablet Take 50 mcg by mouth daily before breakfast.  06/28/13  Yes Historical Provider, MD  Loperamide HCl (IMODIUM PO) Take 1 tablet by mouth daily as needed (diarrhea).   Yes Historical Provider, MD  naproxen sodium (ANAPROX) 220 MG tablet Take 220 mg by mouth 2 (two) times daily as needed (pain).   Yes Historical Provider, MD  ondansetron (ZOFRAN) 4 MG tablet Take 1 tablet (4 mg total) by mouth every 8 (eight) hours as needed for nausea or vomiting. 07/08/15  Yes Iran Planas, MD  oxyCODONE-acetaminophen (PERCOCET) 10-325 MG tablet Take  1 tablet by mouth every 4 (four) hours as needed for pain. 07/08/15  Yes Iran Planas, MD  ranitidine (ZANTAC) 150 MG capsule Take 150 mg by mouth every evening.   Yes Historical Provider, MD  vitamin C (ASCORBIC ACID) 500 MG tablet Take 1 tablet (500 mg total) by mouth daily. 07/08/15  Yes Iran Planas, MD   BP 132/89 mmHg  Pulse 80  Temp(Src) 101.8 F (38.8 C) (Rectal)  Resp 22  Ht 5\' 3"  (1.6 m)  Wt 108.3 kg  BMI  42.30 kg/m2  SpO2 95% Physical Exam  Constitutional: She is oriented to person, place, and time. She appears well-developed and well-nourished. No distress.  HENT:  Head: Normocephalic.  Mouth/Throat: Uvula is midline. Mucous membranes are not pale and not dry.  Lip dry  Eyes: Pupils are equal, round, and reactive to light.  Cardiovascular: Normal rate and regular rhythm.   Pulmonary/Chest: Effort normal and breath sounds normal.  Abdominal: Soft. She exhibits no distension. There is no tenderness.  Musculoskeletal: She exhibits edema.  Has fractured L radius/ulna in splint from previous injury  Neurological: She is alert and oriented to person, place, and time.  Skin: Skin is warm and dry. She is not diaphoretic. There is pallor.  Nursing note and vitals reviewed.   ED Course  Procedures (including critical care time) Labs Review Labs Reviewed  CBC WITH DIFFERENTIAL/PLATELET - Abnormal; Notable for the following:    Hemoglobin 15.3 (*)    HCT 47.1 (*)    Platelets 130 (*)    Neutro Abs 8.2 (*)    All other components within normal limits  URINALYSIS, ROUTINE W REFLEX MICROSCOPIC (NOT AT [facility]) - Abnormal; Notable for the following:    APPearance CLOUDY (*)    Hgb urine dipstick MODERATE (*)    Protein, ur 30 (*)    Leukocytes, UA LARGE (*)    All other components within normal limits  URINE MICROSCOPIC-ADD ON - Abnormal; Notable for the following:    Squamous Epithelial / LPF 0-5 (*)    Bacteria, UA RARE (*)    All other components within normal limits   COMPREHENSIVE METABOLIC PANEL - Abnormal; Notable for the following:    Glucose, Bld 161 (*)    Creatinine, Ser 1.10 (*)    Calcium 8.4 (*)    Total Protein 6.1 (*)    Albumin 3.2 (*)    GFR calc non Af Amer 50 (*)    GFR calc Af Amer 58 (*)    All other components within normal limits  CBC - Abnormal; Notable for the following:    WBC 12.2 (*)    Hemoglobin 15.4 (*)    HCT 48.2 (*)    Platelets 134 (*)    All other components within normal limits  I-STAT CHEM 8, ED - Abnormal; Notable for the following:    Chloride 99 (*)    Glucose, Bld 138 (*)    Calcium, Ion 1.06 (*)    Hemoglobin 16.7 (*)    HCT 49.0 (*)    All other components within normal limits  URINE CULTURE  BASIC METABOLIC PANEL  LACTIC ACID, PLASMA  LACTIC ACID, PLASMA  I-STAT CG4 LACTIC ACID, ED    Imaging Review Dg Chest 2 View  07/24/2015  CLINICAL DATA:  Fever.  Urinary tract symptoms. EXAM: CHEST  2 VIEW COMPARISON:  03/20/2015 FINDINGS: There is moderate cardiomegaly and aortic tortuosity. The lungs are clear. There is no pleural effusion. Hilar and mediastinal contours are unremarkable and unchanged. IMPRESSION: Cardiomegaly.  No acute findings. Electronically Signed   By: Andreas Newport M.D.   On: 07/24/2015 21:42   I have personally reviewed and evaluated these images and lab results as part of my medical decision-making.   EKG Interpretation None     Treated fro UTI, hypoxia of unexplained origin chest xray normal patient placed on BiPap until she is place [facility] when she can be switched to CAP MDM   Final diagnoses:  UTI (lower urinary tract infection)  Sleep  apnea in adult         Junius Creamer, NP 07/25/15 [id number]  Gareth Morgan, MD 07/30/15 2145

## 2015-07-25 DIAGNOSIS — K219 Gastro-esophageal reflux disease without esophagitis: Secondary | ICD-10-CM | POA: Diagnosis present

## 2015-07-25 DIAGNOSIS — N390 Urinary tract infection, site not specified: Secondary | ICD-10-CM | POA: Diagnosis present

## 2015-07-25 DIAGNOSIS — A419 Sepsis, unspecified organism: Secondary | ICD-10-CM | POA: Diagnosis present

## 2015-07-25 DIAGNOSIS — E039 Hypothyroidism, unspecified: Secondary | ICD-10-CM | POA: Diagnosis present

## 2015-07-25 DIAGNOSIS — S5292XD Unspecified fracture of left forearm, subsequent encounter for closed fracture with routine healing: Secondary | ICD-10-CM | POA: Diagnosis not present

## 2015-07-25 DIAGNOSIS — Z923 Personal history of irradiation: Secondary | ICD-10-CM | POA: Diagnosis not present

## 2015-07-25 DIAGNOSIS — G4733 Obstructive sleep apnea (adult) (pediatric): Secondary | ICD-10-CM | POA: Diagnosis present

## 2015-07-25 DIAGNOSIS — D696 Thrombocytopenia, unspecified: Secondary | ICD-10-CM | POA: Diagnosis present

## 2015-07-25 DIAGNOSIS — I10 Essential (primary) hypertension: Secondary | ICD-10-CM | POA: Diagnosis present

## 2015-07-25 DIAGNOSIS — D649 Anemia, unspecified: Secondary | ICD-10-CM | POA: Diagnosis present

## 2015-07-25 DIAGNOSIS — Z981 Arthrodesis status: Secondary | ICD-10-CM | POA: Diagnosis not present

## 2015-07-25 DIAGNOSIS — C50912 Malignant neoplasm of unspecified site of left female breast: Secondary | ICD-10-CM | POA: Diagnosis present

## 2015-07-25 DIAGNOSIS — H9190 Unspecified hearing loss, unspecified ear: Secondary | ICD-10-CM | POA: Diagnosis present

## 2015-07-25 DIAGNOSIS — Z823 Family history of stroke: Secondary | ICD-10-CM | POA: Diagnosis not present

## 2015-07-25 DIAGNOSIS — Z79899 Other long term (current) drug therapy: Secondary | ICD-10-CM | POA: Diagnosis not present

## 2015-07-25 DIAGNOSIS — R251 Tremor, unspecified: Secondary | ICD-10-CM | POA: Diagnosis present

## 2015-07-25 DIAGNOSIS — G4730 Sleep apnea, unspecified: Secondary | ICD-10-CM | POA: Insufficient documentation

## 2015-07-25 DIAGNOSIS — A4151 Sepsis due to Escherichia coli [E. coli]: Secondary | ICD-10-CM | POA: Diagnosis present

## 2015-07-25 DIAGNOSIS — Z8673 Personal history of transient ischemic attack (TIA), and cerebral infarction without residual deficits: Secondary | ICD-10-CM | POA: Diagnosis not present

## 2015-07-25 DIAGNOSIS — F419 Anxiety disorder, unspecified: Secondary | ICD-10-CM | POA: Diagnosis present

## 2015-07-25 DIAGNOSIS — R7881 Bacteremia: Secondary | ICD-10-CM | POA: Diagnosis not present

## 2015-07-25 DIAGNOSIS — R32 Unspecified urinary incontinence: Secondary | ICD-10-CM | POA: Diagnosis present

## 2015-07-25 DIAGNOSIS — F329 Major depressive disorder, single episode, unspecified: Secondary | ICD-10-CM | POA: Diagnosis present

## 2015-07-25 DIAGNOSIS — Z885 Allergy status to narcotic agent status: Secondary | ICD-10-CM | POA: Diagnosis not present

## 2015-07-25 DIAGNOSIS — Z96641 Presence of right artificial hip joint: Secondary | ICD-10-CM | POA: Diagnosis present

## 2015-07-25 LAB — COMPREHENSIVE METABOLIC PANEL
ALBUMIN: 3.2 g/dL — AB (ref 3.5–5.0)
ALT: 18 U/L (ref 14–54)
ANION GAP: 7 (ref 5–15)
AST: 17 U/L (ref 15–41)
Alkaline Phosphatase: 43 U/L (ref 38–126)
BILIRUBIN TOTAL: 1.2 mg/dL (ref 0.3–1.2)
BUN: 11 mg/dL (ref 6–20)
CO2: 28 mmol/L (ref 22–32)
Calcium: 8.4 mg/dL — ABNORMAL LOW (ref 8.9–10.3)
Chloride: 104 mmol/L (ref 101–111)
Creatinine, Ser: 1.1 mg/dL — ABNORMAL HIGH (ref 0.44–1.00)
GFR, EST AFRICAN AMERICAN: 58 mL/min — AB (ref >60)
GFR, EST NON AFRICAN AMERICAN: 50 mL/min — AB (ref >60)
GLUCOSE: 161 mg/dL — AB (ref 65–99)
POTASSIUM: 3.9 mmol/L (ref 3.5–5.1)
Sodium: 139 mmol/L (ref 135–145)
TOTAL PROTEIN: 6.1 g/dL — AB (ref 6.5–8.1)

## 2015-07-25 LAB — BASIC METABOLIC PANEL
ANION GAP: 9 (ref 5–15)
BUN: 12 mg/dL (ref 6–20)
CALCIUM: 8.7 mg/dL — AB (ref 8.9–10.3)
CO2: 28 mmol/L (ref 22–32)
Chloride: 104 mmol/L (ref 101–111)
Creatinine, Ser: 1.17 mg/dL — ABNORMAL HIGH (ref 0.44–1.00)
GFR calc Af Amer: 54 mL/min — ABNORMAL LOW (ref >60)
GFR, EST NON AFRICAN AMERICAN: 46 mL/min — AB (ref >60)
Glucose, Bld: 136 mg/dL — ABNORMAL HIGH (ref 65–99)
POTASSIUM: 4.3 mmol/L (ref 3.5–5.1)
Sodium: 141 mmol/L (ref 135–145)

## 2015-07-25 LAB — LACTIC ACID, PLASMA
LACTIC ACID, VENOUS: 0.7 mmol/L (ref 0.5–2.0)
LACTIC ACID, VENOUS: 1 mmol/L (ref 0.5–2.0)

## 2015-07-25 LAB — CBC
HEMATOCRIT: 48.2 % — AB (ref 36.0–46.0)
Hemoglobin: 15.4 g/dL — ABNORMAL HIGH (ref 12.0–15.0)
MCH: 31.4 pg (ref 26.0–34.0)
MCHC: 32 g/dL (ref 30.0–36.0)
MCV: 98.4 fL (ref 78.0–100.0)
Platelets: 134 10*3/uL — ABNORMAL LOW (ref 150–400)
RBC: 4.9 MIL/uL (ref 3.87–5.11)
RDW: 14.8 % (ref 11.5–15.5)
WBC: 12.2 10*3/uL — AB (ref 4.0–10.5)

## 2015-07-25 LAB — I-STAT CG4 LACTIC ACID, ED: Lactic Acid, Venous: 0.56 mmol/L (ref 0.5–2.0)

## 2015-07-25 MED ORDER — VITAMIN D 1000 UNITS PO TABS
1000.0000 [IU] | ORAL_TABLET | Freq: Every day | ORAL | Status: DC
  Administered 2015-07-25 – 2015-08-01 (×8): 1000 [IU] via ORAL
  Filled 2015-07-25 (×8): qty 1

## 2015-07-25 MED ORDER — AMLODIPINE BESYLATE 10 MG PO TABS
10.0000 mg | ORAL_TABLET | Freq: Every day | ORAL | Status: DC
  Administered 2015-07-25 – 2015-08-01 (×8): 10 mg via ORAL
  Filled 2015-07-25 (×9): qty 1

## 2015-07-25 MED ORDER — NAPROXEN 250 MG PO TABS: 250.0000 mg | ORAL_TABLET | Freq: Two times a day (BID) | ORAL | Status: DC | PRN

## 2015-07-25 MED ORDER — LEVOTHYROXINE SODIUM 50 MCG PO TABS
50.0000 ug | ORAL_TABLET | Freq: Every day | ORAL | Status: DC
  Administered 2015-07-25 – 2015-08-01 (×8): 50 ug via ORAL
  Filled 2015-07-25 (×8): qty 1

## 2015-07-25 MED ORDER — OXYCODONE-ACETAMINOPHEN 5-325 MG PO TABS
1.0000 | ORAL_TABLET | ORAL | Status: DC | PRN
  Administered 2015-07-28: 1 via ORAL
  Filled 2015-07-25 (×2): qty 1

## 2015-07-25 MED ORDER — OXYCODONE-ACETAMINOPHEN 10-325 MG PO TABS: 1.0000 | ORAL_TABLET | ORAL | Status: DC | PRN

## 2015-07-25 MED ORDER — HEPARIN SODIUM (PORCINE) 5000 UNIT/ML IJ SOLN
5000.0000 [IU] | Freq: Three times a day (TID) | INTRAMUSCULAR | Status: DC
  Administered 2015-07-25 – 2015-07-26 (×5): 5000 [IU] via SUBCUTANEOUS
  Filled 2015-07-25 (×4): qty 1

## 2015-07-25 MED ORDER — CLONAZEPAM 0.5 MG PO TABS: 1.0000 mg | ORAL_TABLET | ORAL | Status: DC

## 2015-07-25 MED ORDER — OXYCODONE HCL 5 MG PO TABS
5.0000 mg | ORAL_TABLET | ORAL | Status: DC | PRN
  Administered 2015-07-27 – 2015-07-28 (×2): 5 mg via ORAL
  Filled 2015-07-25 (×3): qty 1

## 2015-07-25 MED ORDER — DEXTROSE 5 % IV SOLN: 1.0000 g | INTRAVENOUS | Status: DC

## 2015-07-25 MED ORDER — ESCITALOPRAM OXALATE 20 MG PO TABS
20.0000 mg | ORAL_TABLET | Freq: Every day | ORAL | Status: DC
  Administered 2015-07-25 – 2015-08-01 (×8): 20 mg via ORAL
  Filled 2015-07-25 (×9): qty 1

## 2015-07-25 MED ORDER — ACETAMINOPHEN 325 MG PO TABS
650.0000 mg | ORAL_TABLET | Freq: Four times a day (QID) | ORAL | Status: DC | PRN
  Administered 2015-07-25 – 2015-07-27 (×3): 650 mg via ORAL
  Filled 2015-07-25 (×3): qty 2

## 2015-07-25 MED ORDER — CETYLPYRIDINIUM CHLORIDE 0.05 % MT LIQD
7.0000 mL | Freq: Two times a day (BID) | OROMUCOSAL | Status: DC
  Administered 2015-07-25 – 2015-08-01 (×12): 7 mL via OROMUCOSAL

## 2015-07-25 MED ORDER — ONDANSETRON HCL 4 MG PO TABS: 4.0000 mg | ORAL_TABLET | Freq: Three times a day (TID) | ORAL | Status: DC | PRN

## 2015-07-25 MED ORDER — DEXTROSE 5 % IV SOLN
1.0000 g | INTRAVENOUS | Status: DC
  Administered 2015-07-25: 1 g via INTRAVENOUS
  Filled 2015-07-25 (×2): qty 10

## 2015-07-25 MED ORDER — CLONAZEPAM 1 MG PO TABS
2.0000 mg | ORAL_TABLET | Freq: Every evening | ORAL | Status: DC
  Administered 2015-07-25 – 2015-07-30 (×6): 2 mg via ORAL
  Filled 2015-07-25 (×7): qty 2

## 2015-07-25 MED ORDER — FAMOTIDINE 20 MG PO TABS
20.0000 mg | ORAL_TABLET | Freq: Every day | ORAL | Status: DC
Start: 2015-07-25 — End: 2015-08-01
  Administered 2015-07-25 – 2015-07-31 (×8): 20 mg via ORAL
  Filled 2015-07-25 (×8): qty 1

## 2015-07-25 MED ORDER — SODIUM CHLORIDE 0.9 % IV SOLN
INTRAVENOUS | Status: DC
Start: 2015-07-25 — End: 2015-08-01
  Administered 2015-07-25 – 2015-07-30 (×5): via INTRAVENOUS

## 2015-07-25 MED ORDER — CLONAZEPAM 1 MG PO TABS
1.0000 mg | ORAL_TABLET | Freq: Every day | ORAL | Status: DC
  Administered 2015-07-25 – 2015-08-01 (×8): 1 mg via ORAL
  Filled 2015-07-25 (×8): qty 1

## 2015-07-25 MED ORDER — ATENOLOL 50 MG PO TABS
25.0000 mg | ORAL_TABLET | Freq: Every day | ORAL | Status: DC
  Administered 2015-07-25: 25 mg via ORAL
  Filled 2015-07-25 (×2): qty 1

## 2015-07-25 NOTE — Progress Notes (Signed)
Patient seen and examined, admitted by Dr. Alcario Drought this morning. Briefly 69 year old female with chronic neurological problems, bladder incontinence, was going to have bladder tuck surgery by her urologist (not scheduled yet) in near future. She presented with dysuria, decrease appetite over 3 days, fevers and chills. Temperature 102.7 UA positive for UTI.  SIRS/uti  - Agree with current assessment and plan, labs reviewed, imagings reviewed - Added blood cultures, follow urine culture and cell studies, continue IV Rocephin. - Continue hydration, PTOT evaluation   , M.D. [facility] 07/25/2015, 9:45 AM  Pager: [phone]

## 2015-07-25 NOTE — ED Notes (Signed)
Pt ambulated in hallway in POD E with steady gait. O2 on 2L [place] prior to ambulation was 92%. Pt ambulated and then returned back to stretcher and then RA O2 dropped down to 83%.

## 2015-07-25 NOTE — ED Notes (Signed)
Urine culture specimen collected via In and Out cath,not clean catch, error in charting.

## 2015-07-25 NOTE — Progress Notes (Signed)
Placed patient on CPAP set at 10cm with oxygen set at 2lpm

## 2015-07-25 NOTE — H&P (Signed)
[facility] History and Physical  [name] QQV:[id number] DOB: 1945-10-28 DOA: 07/24/2015  Referring physician: EDP PCP: Marletta Lor, NP   Chief Complaint: Dysuria   HPI: [name] is a 69 y.o. female who feels poorly, she reports "chronic bladder problems" described as chronic urinary incontinence.  She is due to have a bladder tack procedure but hasnt had this done yet.  For the past 5 days she has had dysuria, decreased appetite over past 3 days, chills developed last night.  Went to Urgent care who transferred her to ED with "dehydration syndrome".  In the ED she was found to have UTI, with fever of 102.7.  Review of Systems: Systems reviewed.  As above, otherwise negative  Past Medical History  Diagnosis Date  . Tremor   . Bowel incontinence   . Bladder incontinence   . High blood pressure   . Anemia   . Depression   . Arthritis   . Cancer [facility])     breast cancer  . GERD (gastroesophageal reflux disease)   . Full dentures   . HOH (hard of hearing)   . Complication of anesthesia     hard to wake up  . Anxiety   . Chronic diarrhea   . Breast cancer (HCC) 08/16/13    left, 1 o'clock  . History of radiation therapy 11/02/13- 12/24/13    left breast 4680 cGy in 26 sessions, left breast boost 1400 cGy in 7 sessions  . Stroke (HCC) 1980    legs weak  . Sleep apnea     has a cpap-5 hr/ not used in years  . Pneumonia     hx  . Hypothyroidism   . Urinary incontinence   . Shortness of breath     when walks  . Seizures (HCC)     no meds   Past Surgical History  Procedure Laterality Date  . Total hip arthroplasty  2005    rt total hip  . Abdominal hysterectomy    . Ovarian cysts    . Back surgery  2005    lumbar fusion  . Colonoscopy    . Multiple tooth extractions    . Breast lumpectomy with needle localization and axillary sentinel lymph node bx Left 09/29/2013    Procedure: BREAST LUMPECTOMY WITH NEEDLE LOCALIZATION AND AXILLARY SENTINEL LYMPH NODE  BX;  Surgeon: Almond Lint, MD;  Location: [facility];  Service: General;  Laterality: Left;  clean wound class  . Orif wrist fracture Left 07/08/2015    Procedure: OPEN REDUCTION INTERNAL FIXATION (ORIF) LEFT WRIST FRACTURE AND REPAIR AS INDICATED;  Surgeon: Bradly Bienenstock, MD;  Location: [facility] OR;  Service: Orthopedics;  Laterality: Left;   Social History:  reports that she has never smoked. She has never used smokeless tobacco. She reports that she does not drink alcohol or use illicit drugs.  Allergies  Allergen Reactions  . Codeine Nausea And Vomiting    Family History  Problem Relation Age of Onset  . Ataxia Neg Hx   . Chorea Neg Hx   . Dementia Neg Hx   . Mental retardation Neg Hx   . Migraines Neg Hx   . Multiple sclerosis Neg Hx   . Neurofibromatosis Neg Hx   . Neuropathy Neg Hx   . Parkinsonism Neg Hx   . Seizures Neg Hx   . Lung cancer Brother   . Stroke Mother      Prior to Admission medications   Medication Sig  Start Date End Date Taking? Authorizing Provider  amLODipine (NORVASC) 5 MG tablet Take 10 mg by mouth daily.  06/01/13  Yes Historical Provider, MD  atenolol (TENORMIN) 25 MG tablet Take 25 mg by mouth at bedtime.    Yes Historical Provider, MD  Cetirizine HCl (ZYRTEC ALLERGY PO) Take 1 tablet by mouth at bedtime as needed (allergies).    Yes Historical Provider, MD  cholecalciferol (VITAMIN D) 1000 UNITS tablet Take 1,000 Units by mouth daily.   Yes Historical Provider, MD  clonazePAM (KLONOPIN) 2 MG tablet Take 1-2 mg by mouth See admin instructions. Patient states she takes 1mg  in the morning then she will take two of the 2mg  in the evening   Yes Historical Provider, MD  escitalopram (LEXAPRO) 20 MG tablet Take 20 mg by mouth daily.   Yes Historical Provider, MD  levothyroxine (SYNTHROID, LEVOTHROID) 50 MCG tablet Take 50 mcg by mouth daily before breakfast.  06/28/13  Yes Historical Provider, MD  Loperamide HCl (IMODIUM PO) Take 1 tablet by mouth  daily as needed (diarrhea).   Yes Historical Provider, MD  naproxen sodium (ANAPROX) 220 MG tablet Take 220 mg by mouth 2 (two) times daily as needed (pain).   Yes Historical Provider, MD  ondansetron (ZOFRAN) 4 MG tablet Take 1 tablet (4 mg total) by mouth every 8 (eight) hours as needed for nausea or vomiting. 07/08/15  Yes Bradly Bienenstock, MD  oxyCODONE-acetaminophen (PERCOCET) 10-325 MG tablet Take 1 tablet by mouth every 4 (four) hours as needed for pain. 07/08/15  Yes Bradly Bienenstock, MD  ranitidine (ZANTAC) 150 MG capsule Take 150 mg by mouth every evening.   Yes Historical Provider, MD  vitamin C (ASCORBIC ACID) 500 MG tablet Take 1 tablet (500 mg total) by mouth daily. 07/08/15  Yes Bradly Bienenstock, MD   Physical Exam: Filed Vitals:   07/25/15 0045 07/25/15 0135  BP: 111/66 104/54  Pulse: 69 64  Temp:    Resp: 18 22    BP 104/54 mmHg  Pulse 64  Temp(Src) 100.5 F (38.1 C) (Oral)  Resp 22  Ht 5\' 3"  (1.6 m)  Wt 108.863 kg (240 lb)  BMI 42.52 kg/m2  SpO2 98%  General Appearance:    Alert, oriented, no distress, appears stated age  Head:    Normocephalic, atraumatic  Eyes:    PERRL, EOMI, sclera non-icteric        Nose:   Nares without drainage or epistaxis. Mucosa, turbinates normal  Throat:   Moist mucous membranes. Oropharynx without erythema or exudate.  Neck:   Supple. No carotid bruits.  No thyromegaly.  No lymphadenopathy.   Back:     No CVA tenderness, no spinal tenderness  Lungs:     Clear to auscultation bilaterally, without wheezes, rhonchi or rales  Chest wall:    No tenderness to palpitation  Heart:    Regular rate and rhythm without murmurs, gallops, rubs  Abdomen:     Soft, non-tender, nondistended, normal bowel sounds, no organomegaly  Genitalia:    deferred  Rectal:    deferred  Extremities:   No clubbing, cyanosis or edema.  Pulses:   2+ and symmetric all extremities  Skin:   Skin color, texture, turgor normal, no rashes or lesions  Lymph nodes:   Cervical,  supraclavicular, and axillary nodes normal  Neurologic:   CNII-XII intact. Normal strength, sensation and reflexes      throughout    Labs on Admission:  Basic Metabolic Panel:  Recent Labs Lab  07/24/15 2220 07/25/15 0042  NA 140 139  K 3.8 3.9  CL 99* 104  CO2  --  28  GLUCOSE 138* 161*  BUN 14 11  CREATININE 1.00 1.10*  CALCIUM  --  8.4*   Liver Function Tests:  Recent Labs Lab 07/25/15 0042  AST 17  ALT 18  ALKPHOS 43  BILITOT 1.2  PROT 6.1*  ALBUMIN 3.2*   No results for input(s): LIPASE, AMYLASE in the last 168 hours. No results for input(s): AMMONIA in the last 168 hours. CBC:  Recent Labs Lab 07/24/15 2220 07/24/15 2225  WBC  --  9.7  NEUTROABS  --  8.2*  HGB 16.7* 15.3*  HCT 49.0* 47.1*  MCV  --  97.9  PLT  --  130*   Cardiac Enzymes: No results for input(s): CKTOTAL, CKMB, CKMBINDEX, TROPONINI in the last 168 hours.  BNP (last 3 results) No results for input(s): PROBNP in the last 8760 hours. CBG: No results for input(s): GLUCAP in the last 168 hours.  Radiological Exams on Admission: Dg Chest 2 View  07/24/2015  CLINICAL DATA:  Fever.  Urinary tract symptoms. EXAM: CHEST  2 VIEW COMPARISON:  03/20/2015 FINDINGS: There is moderate cardiomegaly and aortic tortuosity. The lungs are clear. There is no pleural effusion. Hilar and mediastinal contours are unremarkable and unchanged. IMPRESSION: Cardiomegaly.  No acute findings. Electronically Signed   By: Ellery Plunk M.D.   On: 07/24/2015 21:42    EKG: Independently reviewed.  Assessment/Plan Principal Problem:   Sepsis secondary to UTI (HCC)   1. Sepsis secondary to UTI - 1. PRN tylenol for fever 2. Rocephin 3. Cultures pending 4. Repeat CBC and BMP in AM 5. Long term likely needs the incontinence addressed to help lower risk of recurrent UTIs (is already doing this by seeing urology it sounds like). 2. OSA - 1. CPAP per home settings    Code Status: Full Code  Family  Communication: No family in room Disposition Plan: Admit to inpatient   Time spent: 70 min  Hilarie Sinha M. [facility] Pager [phone]  If 7AM-7PM, please contact the day team taking care of the patient [email or web address] Password [email or web address] 07/25/2015, 1:49 AM

## 2015-07-25 NOTE — Progress Notes (Signed)
PT Cancellation Note  Patient Details Name: WILDER MOSSA MRN: [id number] DOB: [birth year]   Cancelled Treatment:    Reason Eval/Treat Not Completed: Patient not medically ready   Patient continues with elevated temp, feels "chilled" and is achey all over. PT eval deferred at this time and will attempt 11/30   Brittaney Beaulieu 07/25/2015, 3:58 PM  Pager [phone]

## 2015-07-25 NOTE — Progress Notes (Signed)
Nutrition Brief Note  Patient identified on the Malnutrition Screening Tool (MST) Report  Wt Readings from Last 15 Encounters:  07/25/15 238 lb 12.1 oz (108.3 kg)  07/08/15 244 lb 6.4 oz (110.859 kg)  07/07/15 244 lb 6.4 oz (110.859 kg)  03/19/15 250 lb (113.399 kg)  06/21/14 246 lb 11.2 oz (111.902 kg)  01/25/14 250 lb 1.6 oz (113.445 kg)  12/20/13 249 lb 3.2 oz (113.036 kg)  12/13/13 249 lb 11.2 oz (113.263 kg)  12/06/13 249 lb 1.6 oz (112.991 kg)  11/22/13 250 lb 8 oz (113.626 kg)  11/08/13 247 lb 11.2 oz (112.356 kg)  10/18/13 249 lb 12.8 oz (113.309 kg)  10/14/13 250 lb 11.2 oz (113.717 kg)  10/12/13 248 lb 8 oz (112.719 kg)  09/29/13 243 lb (110.224 kg)   [name] is a 69 y.o. female who feels poorly, she reports "chronic bladder problems" described as chronic urinary incontinence. She is due to have a bladder tack procedure but hasnt had this done yet. For the past 5 days she has had dysuria, decreased appetite over past 3 days, chills developed last night. Went to Urgent care who transferred her to ED with "dehydration syndrome".  Pt very somnolent at time of visit. She did not arouse during exam or when name was called.   Reviewed wt hx; wt changes not significant for time frame. No signs of fat or muscle depletion noted.   Body mass index is 42.3 kg/(m^2). Patient meets criteria for extreme obesity, class III based on current BMI.   Current diet order is Heart Healthy, patient is consuming approximately 50% of meals at this time. Labs and medications reviewed.   No nutrition interventions warranted at this time. If nutrition issues arise, please consult RD.   Layloni Fahrner A. Jimmye Norman, RD, LDN, CDE Pager: [phone] After hours Pager: [phone]

## 2015-07-25 NOTE — Progress Notes (Signed)
Pt is on cpap at this time tolerating it well. Settings in flowsheet

## 2015-07-25 NOTE — Progress Notes (Signed)
Pt cant stay awake long enough for me to ask questions to assess her need for RT tx's. Pt is stable at this time. Preparing patient for CPAP use.

## 2015-07-25 NOTE — ED Notes (Signed)
Verbal order for CPAP by Baker Janus NP. Respiratory called.

## 2015-07-26 DIAGNOSIS — N390 Urinary tract infection, site not specified: Secondary | ICD-10-CM

## 2015-07-26 DIAGNOSIS — G4733 Obstructive sleep apnea (adult) (pediatric): Secondary | ICD-10-CM

## 2015-07-26 DIAGNOSIS — A419 Sepsis, unspecified organism: Secondary | ICD-10-CM

## 2015-07-26 LAB — BASIC METABOLIC PANEL
Anion gap: 7 (ref 5–15)
BUN: 11 mg/dL (ref 6–20)
CHLORIDE: 105 mmol/L (ref 101–111)
CO2: 27 mmol/L (ref 22–32)
CREATININE: 0.96 mg/dL (ref 0.44–1.00)
Calcium: 8.1 mg/dL — ABNORMAL LOW (ref 8.9–10.3)
GFR calc non Af Amer: 59 mL/min — ABNORMAL LOW (ref >60)
Glucose, Bld: 115 mg/dL — ABNORMAL HIGH (ref 65–99)
Potassium: 4 mmol/L (ref 3.5–5.1)
Sodium: 139 mmol/L (ref 135–145)

## 2015-07-26 LAB — CBC
HCT: 42.6 % (ref 36.0–46.0)
HEMOGLOBIN: 13.7 g/dL (ref 12.0–15.0)
MCH: 31.6 pg (ref 26.0–34.0)
MCHC: 32.2 g/dL (ref 30.0–36.0)
MCV: 98.4 fL (ref 78.0–100.0)
PLATELETS: 116 10*3/uL — AB (ref 150–400)
RBC: 4.33 MIL/uL (ref 3.87–5.11)
RDW: 14.7 % (ref 11.5–15.5)
WBC: 10.4 10*3/uL (ref 4.0–10.5)

## 2015-07-26 MED ORDER — ATENOLOL 50 MG PO TABS
25.0000 mg | ORAL_TABLET | Freq: Every day | ORAL | Status: DC
  Administered 2015-07-26 – 2015-07-31 (×5): 25 mg via ORAL
  Filled 2015-07-26 (×6): qty 1

## 2015-07-26 MED ORDER — ENOXAPARIN SODIUM 40 MG/0.4ML ~~LOC~~ SOLN
40.0000 mg | SUBCUTANEOUS | Status: DC
  Administered 2015-07-27 – 2015-07-31 (×5): 40 mg via SUBCUTANEOUS
  Filled 2015-07-26 (×6): qty 0.4

## 2015-07-26 MED ORDER — ALUM & MAG HYDROXIDE-SIMETH 200-200-20 MG/5ML PO SUSP
30.0000 mL | Freq: Four times a day (QID) | ORAL | Status: DC | PRN
  Administered 2015-07-28: 30 mL via ORAL
  Filled 2015-07-26: qty 30

## 2015-07-26 MED ORDER — DEXTROSE 5 % IV SOLN
2.0000 g | INTRAVENOUS | Status: DC
  Administered 2015-07-26: 2 g via INTRAVENOUS
  Filled 2015-07-26 (×2): qty 2

## 2015-07-26 MED ORDER — PANTOPRAZOLE SODIUM 40 MG PO TBEC
40.0000 mg | DELAYED_RELEASE_TABLET | Freq: Every day | ORAL | Status: DC
  Administered 2015-07-27 – 2015-07-28 (×2): 40 mg via ORAL
  Filled 2015-07-26 (×2): qty 1

## 2015-07-26 NOTE — Evaluation (Signed)
Physical Therapy Evaluation Patient Details Name: [name] MRN: [id number] DOB: 1946-01-25 Today's Date: 07/26/2015   History of Present Illness  Patient is a 69 y/o female who presents with dysuria, decreased appetite over past several days and chills. Went to Urgent care who transferred her to ED with "dehydration syndrome". UA positive for UTI. PMH includes ORIF left wrist ~2 weeks ago, tremors, breast ca, seizures, stroke.  Clinical Impression  Patient presents with functional limitations due to deficits listed in PT problem list (see below). Pt demonstrates DOE with drop in Sp02 to 85% on 2L/min 02. Pt with generalized weakness impacting safe mobility. Pt from home alone and would really benefit from having supervision for safety. Required use of RW for support. Pt reports she can stay with her son at d/c if needed. Will follow acutely to maximize independence and mobility prior to return home.    Follow Up Recommendations Supervision for mobility/OOB;Home health PT    Equipment Recommendations  None recommended by PT    Recommendations for Other Services       Precautions / Restrictions Precautions Precautions: Fall Restrictions Weight Bearing Restrictions: Yes LUE Weight Bearing: Non weight bearing Other Position/Activity Restrictions: WB status from prior admission      Mobility  Bed Mobility Overal bed mobility: Needs Assistance Bed Mobility: Supine to Sit     Supine to sit: Supervision;HOB elevated     General bed mobility comments: Increased time to get to EOB with use of rail. + dizziness.  Transfers Overall transfer level: Needs assistance Equipment used: Rolling walker (2 wheeled) Transfers: Sit to/from Stand Sit to Stand: Min guard         General transfer comment: Min guard for safety. Cues not to use LUE to stand.   Ambulation/Gait Ambulation/Gait assistance: Min guard Ambulation Distance (Feet): 100 Feet Assistive device: Rolling walker (2  wheeled) Gait Pattern/deviations: Step-through pattern;Decreased stride length;Trunk flexed Gait velocity: decreased   General Gait Details: mildly unsteady gait, able to push RW with RUE and rest LUE on handle. No LOB. DOE. Sp02 dropped to 85% on 2L/min 02.  Stairs            Wheelchair Mobility    Modified Rankin (Stroke Patients Only)       Balance Overall balance assessment: Needs assistance Sitting-balance support: Feet supported;No upper extremity supported Sitting balance-Leahy Scale: Good     Standing balance support: During functional activity Standing balance-Leahy Scale: Fair Standing balance comment: Able to stand unsupported for short period but requires UE support for dynamic standing/walking.                             Pertinent Vitals/Pain Pain Assessment: Faces Faces Pain Scale: Hurts a little bit Pain Location: abdomen Pain Descriptors / Indicators: Aching Pain Intervention(s): Monitored during session;Repositioned    Home Living Family/patient expects to be discharged to:: Private residence Living Arrangements: Alone Available Help at Discharge: Family;Friend(s);Available PRN/intermittently Type of Home: Apartment Home Access: Level entry     Home Layout: One level Home Equipment: Cane - single point;Walker - 2 wheels;Walker - 4 wheels      Prior Function Level of Independence: Independent         Comments: Reports difficulty performing ADLs since recent surgery on wrist.      Hand Dominance   Dominant Hand: Right    Extremity/Trunk Assessment   Upper Extremity Assessment: Defer to OT evaluation  LUE Deficits / Details: Able to mobilize digits however edema noted.    Lower Extremity Assessment: Generalized weakness         Communication   Communication: No difficulties  Cognition Arousal/Alertness: Awake/alert Behavior During Therapy: WFL for tasks assessed/performed Overall Cognitive Status: No  family/caregiver present to determine baseline cognitive functioning Area of Impairment: Safety/judgement;Problem solving         Safety/Judgement: Decreased awareness of safety   Problem Solving: Slow processing General Comments: Seems WFL however question safety awareness.     General Comments      Exercises        Assessment/Plan    PT Assessment Patient needs continued PT services  PT Diagnosis Difficulty walking;Generalized weakness   PT Problem List Decreased strength;Cardiopulmonary status limiting activity;Pain;Decreased activity tolerance;Decreased mobility;Decreased balance;Decreased safety awareness  PT Treatment Interventions Balance training;Gait training;Functional mobility training;Therapeutic activities;Therapeutic exercise;Patient/family education   PT Goals (Current goals can be found in the Care Plan section) Acute Rehab PT Goals Patient Stated Goal: to get better PT Goal Formulation: With patient Time For Goal Achievement: 08/09/15 Potential to Achieve Goals: Good    Frequency Min 3X/week   Barriers to discharge Decreased caregiver support Pt lives alone    Co-evaluation               End of Session Equipment Utilized During Treatment: Gait belt;Oxygen Activity Tolerance: Patient tolerated treatment well;Treatment limited secondary to medical complications (Comment) (drop in Sp02) Patient left: in bed;with call bell/phone within reach;with bed alarm set Nurse Communication: Mobility status;Precautions         Time: 1114-1140 PT Time Calculation (min) (ACUTE ONLY): 26 min   Charges:   PT Evaluation $Initial PT Evaluation Tier I: 1 Procedure PT Treatments $Gait Training: 8-22 mins   PT G Codes:        Maryl Blalock A Shaketta Rill 07/26/2015, 12:24 PM Mylo Red, PT, DPT [phone]

## 2015-07-26 NOTE — Progress Notes (Signed)
Patient refused CPAP for tonight 

## 2015-07-26 NOTE — Progress Notes (Signed)
PATIENT DETAILS Name: [name] Age: 69 y.o. Sex: female Date of Birth: 1946-08-02 Admit Date: 07/24/2015 Admitting Physician Etta Quill, DO [id number], Almyra Free, NP  Subjective: Still complaining of chills, shaking.Denies abdominal pain   Assessment/Plan: Principal Problem: Gram negative Bacteremia secondary to [id number] with history of chronic urinary incontinence presented to the ED with dysuria, decreased appetite and chills x 3 days.UA was positive for UTI. Blood culture positive for gram negative rods, Urine culture pending. Although improved, still with intermittent chills. Increase Rocephin to 2 g daily, await final sensitivities/ID. Continue supportive measures.   Active Problems: Sleep apnea:Continue CPAP   HTN :-BPs stable during admission.Continue Amlodipine, Atenolol  Hypothyroidism:Continue Levothryoxine  Depression:Stable, denies SI/HI.Continue Lexapro, Klonopin   Thrombocytopenia: Appears to be mild and chronic-stable for outpatient follow-up. Watch closely while on VTE prophylaxis  Tremors: Chronic issue-claims she takes Klonopin.  Left wrist fracture: Underwent ORIF on 11/12-will notify Orthopedics of patient's admission (left msg with Office)-left arm in sling/cast-good capillary refill.  History of left breast cancer:Follows with Dr Lindi Adie  [id number] CPAP qhs  Disposition: Remain inpatient  Antimicrobial agents  See below  Anti-infectives    Start     Dose/Rate Route Frequency Ordered Stop   07/26/15 2200  cefTRIAXone (ROCEPHIN) 2 g in dextrose 5 % 50 mL IVPB     2 g 100 mL/hr over 30 Minutes Intravenous Every 24 hours 07/26/15 0742     07/26/15 0145  cefTRIAXone (ROCEPHIN) 1 g in dextrose 5 % 50 mL IVPB  Status:  Discontinued     1 g 100 mL/hr over 30 Minutes Intravenous Every 24 hours 07/25/15 0144 07/25/15 0144   07/25/15 0144  cefTRIAXone (ROCEPHIN) 1 g in dextrose 5 % 50 mL IVPB  Status:  Discontinued     1 g 100  mL/hr over 30 Minutes Intravenous Every 24 hours 07/25/15 0144 07/26/15 0742   07/25/15 0000  cefTRIAXone (ROCEPHIN) 1 g in dextrose 5 % 50 mL IVPB     1 g 100 mL/hr over 30 Minutes Intravenous  Once 07/24/15 2346 07/25/15 0124      DVT Prophylaxis:  Heparin   Code Status: Full code   Family Communication Daughter in law over the phone  Procedures: None  CONSULTS:  None  Time spent 30 minutes-Greater than 50% of this time was spent in counseling, explanation of diagnosis, planning of further management, and coordination of care.  MEDICATIONS: Scheduled Meds: . amLODipine  10 mg Oral Daily  . antiseptic oral rinse  7 mL Mouth Rinse BID  . atenolol  25 mg Oral QHS  . cefTRIAXone (ROCEPHIN)  IV  2 g Intravenous Q24H  . cholecalciferol  1,000 Units Oral Daily  . clonazePAM  1 mg Oral Daily  . clonazePAM  2 mg Oral QPM  . enoxaparin (LOVENOX) injection  40 mg Subcutaneous Q24H  . escitalopram  20 mg Oral Daily  . famotidine  20 mg Oral QHS  . levothyroxine  50 mcg Oral QAC breakfast   Continuous Infusions: . sodium chloride 20 mL/hr at 07/26/15 1119   PRN Meds:.acetaminophen, ondansetron, oxyCODONE-acetaminophen **AND** oxyCODONE    PHYSICAL EXAM: Vital signs in last 24 hours: Filed Vitals:   07/25/15 1734 07/25/15 2158 07/25/15 2238 07/26/15 0555  BP:  113/67  124/64  Pulse:  75 81 76  Temp: 100.7 F (38.2 C) 99.4 F (37.4 C)  98.7 F (37.1 C)  TempSrc: Oral  Oral    Resp:  15 18 28   Height:      Weight:      SpO2:  91% 95% 92%    Weight change:  Filed Weights   07/24/15 2054 07/25/15 0239  Weight: 108.863 kg (240 lb) 108.3 kg (238 lb 12.1 oz)   Body mass index is 42.3 kg/(m^2).   Gen Exam: Pleasant female, sitting in recliner, NAD  Neck: Supple, No JVD.   Chest: B/L Clear.   CVS: S1 S2 Regular, no murmurs.  Abdomen: soft, BS +, non tender, non distended.  Extremities: no edema, lower extremities warm to touch. Neurologic: Non Focal.   Skin: No  Rash.   Wounds: N/A.    Intake/Output from previous day:  Intake/Output Summary (Last 24 hours) at 07/26/15 1359 Last data filed at 07/25/15 2156  Gross per 24 hour  Intake     50 ml  Output    500 ml  Net   -450 ml     LAB RESULTS: CBC  Recent Labs Lab 07/24/15 2220 07/24/15 2225 07/25/15 0530 07/26/15 0415  WBC  --  9.7 12.2* 10.4  HGB 16.7* 15.3* 15.4* 13.7  HCT 49.0* 47.1* 48.2* 42.6  PLT  --  130* 134* 116*  MCV  --  97.9 98.4 98.4  MCH  --  31.8 31.4 31.6  MCHC  --  32.5 32.0 32.2  RDW  --  14.5 14.8 14.7  LYMPHSABS  --  0.8  --   --   MONOABS  --  0.7  --   --   EOSABS  --  0.0  --   --   BASOSABS  --  0.0  --   --     Chemistries   Recent Labs Lab 07/24/15 2220 07/25/15 0042 07/25/15 0530 07/26/15 0415  NA 140 139 141 139  K 3.8 3.9 4.3 4.0  CL 99* 104 104 105  CO2  --  28 28 27   GLUCOSE 138* 161* 136* 115*  BUN 14 11 12 11   CREATININE 1.00 1.10* 1.17* 0.96  CALCIUM  --  8.4* 8.7* 8.1*    CBG: No results for input(s): GLUCAP in the last 168 hours.  GFR Estimated Creatinine Clearance: 65.3 mL/min (by C-G formula based on Cr of 0.96).   Urine Studies No results for input(s): UHGB, CRYS in the last 72 hours.  Invalid input(s): UACOL, UAPR, USPG, UPH, UTP, UGL, UKET, UBIL, UNIT, UROB, ULEU, UEPI, UWBC, URBC, UBAC, CAST, UCOM, BILUA   MICROBIOLOGY: Recent Results (from the past 240 hour(s))  Urine culture     Status: None (Preliminary result)   Collection Time: 07/24/15 10:41 PM  Result Value Ref Range Status   Specimen Description URINE, CLEAN CATCH  Final   Special Requests NONE  Final   Culture >=100,000 COLONIES/mL ESCHERICHIA COLI  Final   Report Status PENDING  Incomplete  Culture, blood (routine x 2)     Status: None (Preliminary result)   Collection Time: 07/25/15 10:35 AM  Result Value Ref Range Status   Specimen Description BLOOD RIGHT ANTECUBITAL  Final   Special Requests BOTTLES DRAWN AEROBIC ONLY 5CC  Final   Culture   Setup Time   Final    GRAM NEGATIVE RODS AEROBIC BOTTLE ONLY CRITICAL RESULT CALLED TO, READ BACK BY AND VERIFIED WITH: V ROBERTSON @0245  [id number] MKELLY    Culture GRAM NEGATIVE RODS  Final   Report Status PENDING  Incomplete  Culture, blood (routine x 2)  Status: None (Preliminary result)   Collection Time: 07/25/15 10:40 AM  Result Value Ref Range Status   Specimen Description BLOOD RIGHT HAND  Final   Special Requests IN PEDIATRIC BOTTLE 2CC  Final   Culture NO GROWTH 1 DAY  Final   Report Status PENDING  Incomplete    RADIOLOGY STUDIES/RESULTS: Dg Chest 2 View  07/24/2015  CLINICAL DATA:  Fever.  Urinary tract symptoms. EXAM: CHEST  2 VIEW COMPARISON:  03/20/2015 FINDINGS: There is moderate cardiomegaly and aortic tortuosity. The lungs are clear. There is no pleural effusion. Hilar and mediastinal contours are unremarkable and unchanged. IMPRESSION: Cardiomegaly.  No acute findings. Electronically Signed   By: Andreas Newport M.D.   On: 07/24/2015 21:42    Oren Binet, PA-S [facility]  07/26/2015, 1:59 PM   LOS: 1 day

## 2015-07-26 NOTE — Progress Notes (Signed)
CRITICAL VALUE ALERT  Critical value received:  Aerobic BC growing gram neg rods   Date of notification:  07-26-15  Time of notification:  19  Critical value read back:  yes  Nurse who received alert:  Leatrice Jewels, RN  MD notified (1st page):  Lamar Blinks, NP   Time of first page: 0249  MD notified (2nd page):  Time of second page:  Responding MD:  No new orders, pt already on antibiotics  Time MD responded:  n/a

## 2015-07-26 NOTE — Care Management Note (Signed)
Case Management Note  Patient Details  Name: [name] MRN: [id number] Date of Birth: Jan 30, 1946  Subjective/Objective:    Date: 07/26/15 Spoke with patient at the bedside.  Introduced self as Tourist information centre manager and explained role in discharge planning and how to be reached.  Verified patient lives in town, alone. Has DME rollator from [facility].  Expressed potential need for no other DME.  Verified patient anticipates to go home alone, at time of discharge and will have no supervision  at this time to best of their knowledge.  NCM explained to patient that she will get a pt eval by the physical therapist and we will see how her mobility is and go from there.   Patient denied needing help with their medication.  Patient drives  Or is driven by her daughter n law , who lives out of town to MD appointments.  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   Plan: CM will continue to follow for discharge planning and [facility] resources.                 Action/Plan:   Expected Discharge Date:                  Expected Discharge Plan:  [place]  In-House Referral:     Discharge planning Services  CM Consult  Post Acute Care Choice:    Choice offered to:     DME Arranged:    DME Agency:     HH Arranged:    [place] Agency:     Status of Service:  In process, will continue to follow  Medicare Important Message Given:    Date Medicare IM Given:    Medicare IM give by:    Date Additional Medicare IM Given:    Additional Medicare Important Message give by:     If discussed at [place] of Stay Meetings, dates discussed:    Additional Comments:  Zenon Mayo, RN 07/26/2015, 3:25 PM

## 2015-07-27 DIAGNOSIS — R251 Tremor, unspecified: Secondary | ICD-10-CM

## 2015-07-27 DIAGNOSIS — I10 Essential (primary) hypertension: Secondary | ICD-10-CM

## 2015-07-27 LAB — URINE CULTURE

## 2015-07-27 LAB — BASIC METABOLIC PANEL
ANION GAP: 6 (ref 5–15)
BUN: 12 mg/dL (ref 6–20)
CALCIUM: 8.1 mg/dL — AB (ref 8.9–10.3)
CO2: 27 mmol/L (ref 22–32)
Chloride: 106 mmol/L (ref 101–111)
Creatinine, Ser: 0.99 mg/dL (ref 0.44–1.00)
GFR, EST NON AFRICAN AMERICAN: 57 mL/min — AB (ref >60)
GLUCOSE: 125 mg/dL — AB (ref 65–99)
POTASSIUM: 4.2 mmol/L (ref 3.5–5.1)
Sodium: 139 mmol/L (ref 135–145)

## 2015-07-27 LAB — CBC
HEMATOCRIT: 41.8 % (ref 36.0–46.0)
HEMOGLOBIN: 13.2 g/dL (ref 12.0–15.0)
MCH: 31.1 pg (ref 26.0–34.0)
MCHC: 31.6 g/dL (ref 30.0–36.0)
MCV: 98.6 fL (ref 78.0–100.0)
Platelets: 122 10*3/uL — ABNORMAL LOW (ref 150–400)
RBC: 4.24 MIL/uL (ref 3.87–5.11)
RDW: 14.8 % (ref 11.5–15.5)
WBC: 6.6 10*3/uL (ref 4.0–10.5)

## 2015-07-27 MED ORDER — SODIUM CHLORIDE 0.9 % IV SOLN
1.0000 g | INTRAVENOUS | Status: DC
  Administered 2015-07-27 – 2015-07-31 (×5): 1 g via INTRAVENOUS
  Filled 2015-07-27 (×6): qty 1

## 2015-07-27 NOTE — Progress Notes (Signed)
PATIENT DETAILS Name: [name] Age: 69 y.o. Sex: female Date of Birth: 02/13/1946 Admit Date: 07/24/2015 Admitting Physician Etta Quill, DO [id number], Almyra Free, NP  Subjective: Feels better.  Assessment/Plan: Principal Problem: Gram negative Bacteremia secondary to UTI (ESBL E Coli):Patient with history of chronic urinary incontinence presented to the ED with dysuria, decreased appetite and chills x 3 days.UA was positive for UTI.Urine culture positive for E Coli ESBL,Blood culture positive for gram negative rods. Will change antibiotics to Invanz. Although improved, still with intermittent chills. Continue supportive measures.   Active Problems: Sleep apnea:Continue CPAP   [id number] during admission.Continue Amlodipine, Atenolol  Hypothyroidism:Continue Levothryoxine  Depression:Stable, denies SI/HI.Continue Lexapro, Klonopin   Thrombocytopenia: Appears to be mild and chronic-stable for outpatient follow-up. Watch closely while on VTE prophylaxis  Tremors: Chronic issue-claims she takes Klonopin.  Left wrist fracture: Underwent ORIF on 11/12-notified Orthopedics of patient's admission-spoke with Dr Apolonio Schneiders on 11/30-he will see patient -left arm in sling/cast-good capillary refill.  History of left breast cancer:Follows with Dr Lindi Adie  [id number] CPAP qhs  Disposition: Remain inpatient-home when better  Antimicrobial agents  See below  Anti-infectives    Start     Dose/Rate Route Frequency Ordered Stop   07/27/15 1400  ertapenem (INVANZ) 1 g in sodium chloride 0.9 % 50 mL IVPB     1 g 100 mL/hr over 30 Minutes Intravenous Every 24 hours 07/27/15 1302     07/26/15 2200  cefTRIAXone (ROCEPHIN) 2 g in dextrose 5 % 50 mL IVPB  Status:  Discontinued     2 g 100 mL/hr over 30 Minutes Intravenous Every 24 hours 07/26/15 0742 07/27/15 1302   07/26/15 0145  cefTRIAXone (ROCEPHIN) 1 g in dextrose 5 % 50 mL IVPB  Status:  Discontinued     1 g 100  mL/hr over 30 Minutes Intravenous Every 24 hours 07/25/15 0144 07/25/15 0144   07/25/15 0144  cefTRIAXone (ROCEPHIN) 1 g in dextrose 5 % 50 mL IVPB  Status:  Discontinued     1 g 100 mL/hr over 30 Minutes Intravenous Every 24 hours 07/25/15 0144 07/26/15 0742   07/25/15 0000  cefTRIAXone (ROCEPHIN) 1 g in dextrose 5 % 50 mL IVPB     1 g 100 mL/hr over 30 Minutes Intravenous  Once 07/24/15 2346 07/25/15 0124      DVT Prophylaxis: Prophylaxis Lovenox  Code Status: Full code   Family Communication Daughter in law over the phone  Procedures: None  CONSULTS:  None  Time spent 20 minutes-Greater than 50% of this time was spent in counseling, explanation of diagnosis, planning of further management, and coordination of care.  MEDICATIONS: Scheduled Meds: . amLODipine  10 mg Oral Daily  . antiseptic oral rinse  7 mL Mouth Rinse BID  . atenolol  25 mg Oral QHS  . cholecalciferol  1,000 Units Oral Daily  . clonazePAM  1 mg Oral Daily  . clonazePAM  2 mg Oral QPM  . enoxaparin (LOVENOX) injection  40 mg Subcutaneous Q24H  . ertapenem  1 g Intravenous Q24H  . escitalopram  20 mg Oral Daily  . famotidine  20 mg Oral QHS  . levothyroxine  50 mcg Oral QAC breakfast  . pantoprazole  40 mg Oral Q1200   Continuous Infusions: . sodium chloride 20 mL/hr at 07/26/15 1119   PRN Meds:.acetaminophen, alum & mag hydroxide-simeth, ondansetron, oxyCODONE-acetaminophen **AND** oxyCODONE    PHYSICAL EXAM: Vital  signs in last 24 hours: Filed Vitals:   07/26/15 1419 07/26/15 2200 07/27/15 0545 07/27/15 1136  BP: 125/68 121/71 119/68 125/82  Pulse: 69 72 61 120  Temp: 100.3 F (37.9 C) 99.4 F (37.4 C) 98.8 F (37.1 C)   TempSrc: Oral Oral Oral   Resp: 16 18 18    Height:      Weight:      SpO2: 97% 91% 94%     Weight change:  Filed Weights   07/24/15 2054 07/25/15 0239  Weight: 108.863 kg (240 lb) 108.3 kg (238 lb 12.1 oz)   Body mass index is 42.3 kg/(m^2).   Gen Exam:  Pleasant female, sitting in recliner, NAD  Neck: Supple, No JVD.   Chest: B/L Clear.   CVS: S1 S2 Regular, no murmurs.  Abdomen: soft, BS +, non tender, non distended.  Extremities: no edema, lower extremities warm to touch. Neurologic: Non Focal.   Skin: No Rash.   Wounds: N/A.    Intake/Output from previous day:  Intake/Output Summary (Last 24 hours) at 07/27/15 1308 Last data filed at 07/27/15 [id number]  Gross per 24 hour  Intake 930.33 ml  Output    100 ml  Net 830.33 ml     LAB RESULTS: CBC  Recent Labs Lab 07/24/15 2220 07/24/15 2225 07/25/15 0530 07/26/15 0415 07/27/15 0541  WBC  --  9.7 12.2* 10.4 6.6  HGB 16.7* 15.3* 15.4* 13.7 13.2  HCT 49.0* 47.1* 48.2* 42.6 41.8  PLT  --  130* 134* 116* 122*  MCV  --  97.9 98.4 98.4 98.6  MCH  --  31.8 31.4 31.6 31.1  MCHC  --  32.5 32.0 32.2 31.6  RDW  --  14.5 14.8 14.7 14.8  LYMPHSABS  --  0.8  --   --   --   MONOABS  --  0.7  --   --   --   EOSABS  --  0.0  --   --   --   BASOSABS  --  0.0  --   --   --     Chemistries   Recent Labs Lab 07/24/15 2220 07/25/15 0042 07/25/15 0530 07/26/15 0415 07/27/15 0541  NA 140 139 141 139 139  K 3.8 3.9 4.3 4.0 4.2  CL 99* 104 104 105 106  CO2  --  28 28 27 27   GLUCOSE 138* 161* 136* 115* 125*  BUN 14 11 12 11 12   CREATININE 1.00 1.10* 1.17* 0.96 0.99  CALCIUM  --  8.4* 8.7* 8.1* 8.1*    CBG: No results for input(s): GLUCAP in the last 168 hours.  GFR Estimated Creatinine Clearance: 63.3 mL/min (by C-G formula based on Cr of 0.99).   Urine Studies No results for input(s): UHGB, CRYS in the last 72 hours.  Invalid input(s): UACOL, UAPR, USPG, UPH, UTP, UGL, UKET, UBIL, UNIT, UROB, ULEU, UEPI, UWBC, URBC, UBAC, CAST, UCOM, BILUA   MICROBIOLOGY: Recent Results (from the past 240 hour(s))  Urine culture     Status: None   Collection Time: 07/24/15 10:41 PM  Result Value Ref Range Status   Specimen Description URINE, CLEAN CATCH  Final   Special Requests NONE   Final   Culture   Final    >=100,000 COLONIES/mL ESCHERICHIA COLI Confirmed Extended Spectrum Beta-Lactamase Producer (ESBL)    Report Status 07/27/2015 FINAL  Final   Organism ID, Bacteria ESCHERICHIA COLI  Final      Susceptibility   Escherichia coli - MIC*  AMPICILLIN >=32 RESISTANT Resistant     CEFAZOLIN >=64 RESISTANT Resistant     CEFTRIAXONE >=64 RESISTANT Resistant     CIPROFLOXACIN <=0.25 SENSITIVE Sensitive     GENTAMICIN <=1 SENSITIVE Sensitive     IMIPENEM <=0.25 SENSITIVE Sensitive     NITROFURANTOIN <=16 SENSITIVE Sensitive     TRIMETH/SULFA <=20 SENSITIVE Sensitive     AMPICILLIN/SULBACTAM >=32 RESISTANT Resistant     PIP/TAZO <=4 SENSITIVE Sensitive     * >=100,000 COLONIES/mL ESCHERICHIA COLI  Culture, blood (routine x 2)     Status: None (Preliminary result)   Collection Time: 07/25/15 10:35 AM  Result Value Ref Range Status   Specimen Description BLOOD RIGHT ANTECUBITAL  Final   Special Requests BOTTLES DRAWN AEROBIC ONLY 5CC  Final   Culture  Setup Time   Final    GRAM NEGATIVE RODS AEROBIC BOTTLE ONLY CRITICAL RESULT CALLED TO, READ BACK BY AND VERIFIED WITH: V ROBERTSON @0245  07/26/15 MKELLY    Culture ESCHERICHIA COLI  Final   Report Status PENDING  Incomplete  Culture, blood (routine x 2)     Status: None (Preliminary result)   Collection Time: 07/25/15 10:40 AM  Result Value Ref Range Status   Specimen Description BLOOD RIGHT HAND  Final   Special Requests IN PEDIATRIC BOTTLE 2CC  Final   Culture NO GROWTH 1 DAY  Final   Report Status PENDING  Incomplete    RADIOLOGY STUDIES/RESULTS: Dg Chest 2 View  07/24/2015  CLINICAL DATA:  Fever.  Urinary tract symptoms. EXAM: CHEST  2 VIEW COMPARISON:  03/20/2015 FINDINGS: There is moderate cardiomegaly and aortic tortuosity. The lungs are clear. There is no pleural effusion. Hilar and mediastinal contours are unremarkable and unchanged. IMPRESSION: Cardiomegaly.  No acute findings. Electronically Signed    By: Andreas Newport M.D.   On: 07/24/2015 21:42    Oren Binet, PA-S [facility]  07/27/2015, 1:08 PM   LOS: 2 days

## 2015-07-27 NOTE — Progress Notes (Signed)
PT SEEN/EXAMINED RECOMMENED CONTINUE WITH CURRENT SPLINT UNTIL OUT OF HOSPITAL NO WEIGHT ON RIGHT WRIST OK TO USE FINGERS F/U IN OFFICE IN ONE WEEK AND WILL THEN PUT ON CAST CONTACT IF ANY QUESTIONS [phone]

## 2015-07-27 NOTE — Care Management Note (Signed)
Case Management Note  Patient Details  Name: [name] MRN: [id number] Date of Birth: 07/28/1946  Subjective/Objective:   07/27/15               Spoke with patient at the bedside.  Introduced self as Tourist information centre manager and explained role in discharge planning and how to be reached.  Verified patient lives in town, alone. Has DME rollator from [facility].  Expressed potential need for no other DME.  Verified patient anticipates to go SNF, at time of discharge.  NCM explained to patient that she will get a pt eval by the physical therapist and we will see how her mobility is and go from there.  Patient denied needing help with their medication.  Patient drives Or is driven by her daughter n law , who lives out of town to MD appointments.  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   Plan: CM will continue to follow for discharge planning and [facility] resources.  Action/Plan:   Expected Discharge Date:                  Expected Discharge Plan:  Skilled Nursing Facility  In-House Referral:  Clinical Social Work  Discharge planning Services  CM Consult  Post Acute Care Choice:    Choice offered to:     DME Arranged:    DME Agency:     HH Arranged:    [place] Agency:     Status of Service:  Completed, signed off  Medicare Important Message Given:    Date Medicare IM Given:    Medicare IM give by:    Date Additional Medicare IM Given:    Additional Medicare Important Message give by:     If discussed at [place] of Stay Meetings, dates discussed:    Additional Comments:  Zenon Mayo, RN 07/27/2015, 4:46 PM

## 2015-07-27 NOTE — Progress Notes (Signed)
Patient refused CPAP for tonight 

## 2015-07-27 NOTE — NC FL2 (Signed)
[place] LEVEL OF CARE SCREENING TOOL     IDENTIFICATION  Patient Name: [name] Birthdate: September 26, 1945 Sex: female Admission Date (Current Location): 07/24/2015  [facility] and [place] Number: Herbalist and Address:  [facility] [place], [place] [address], [place], [place] [address]      Provider Number: [id number]  Attending Physician Name and Address:  Jonetta Osgood, MD  Relative Name and Phone Number:  Albirda Pauly, Daughter, [phone]    Current Level of Care: Hospital Recommended Level of Care: [place] Prior Approval Number:    Date Approved/Denied:   PASRR Number: [id number]  Discharge Plan: SNF    Current Diagnoses: Patient Active Problem List   Diagnosis Date Noted  . Sepsis secondary to UTI ([place]) 07/25/2015  . Sleep apnea in adult   . UTI (lower urinary tract infection)   . Distal radius fracture, left 07/08/2015  . MDD (major depressive disorder), recurrent severe, without psychosis ([place]) 03/21/2015  . 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 Grieving 03/08/2014  . Depressive disorder 03/08/2014  . Breast cancer of upper-outer quadrant of left female breast ([place]) 08/18/2013    Orientation ACTIVITIES/SOCIAL BLADDER RESPIRATION    Self, Time, Situation, Place  Family supportive Incontinent O2 (As needed) (Nasal Cannula 2L/min; CPAP at hospital for PM. Does not have CPAP at home.)  BEHAVIORAL SYMPTOMS/MOOD NEUROLOGICAL BOWEL NUTRITION STATUS      Continent  (See DC Summary)  PHYSICIAN VISITS COMMUNICATION OF NEEDS Height & Weight Skin    Verbally 5\' 3"  (160 cm) 238 lbs. Other (Comment) (Arm Incision w/dressing 07/08/15)          AMBULATORY STATUS RESPIRATION    Assist extensive O2 (As needed) (Nasal Cannula 2L/min; CPAP at hospital for PM. Does not have CPAP at home.)      [place] Level of Assistance  Bathing, Feeding, Dressing Bathing Assistance: Maximum assistance Feeding  assistance: Limited assistance Dressing Assistance: Maximum assistance      Functional Limitations Info                [place]  PT (By licensed PT)     PT Frequency: 5x/week             Additional Factors Info  Code Status, Allergies Code Status Info: Full Allergies Info: Codeine           Current Medications (07/27/2015):  This is the current hospital active medication list Current Facility-Administered Medications  Medication Dose Route Frequency Provider Last Rate Last Dose  . 0.9 %  sodium chloride infusion   Intravenous Continuous Jonetta Osgood, MD 20 mL/hr at 07/26/15 1119    . acetaminophen (TYLENOL) tablet 650 mg  650 mg Oral Q6H PRN Etta Quill, DO   650 mg at 07/25/15 1535  . alum & mag hydroxide-simeth (MAALOX/MYLANTA) 200-200-20 MG/5ML suspension 30 mL  30 mL Oral Q6H PRN Jonetta Osgood, MD      . amLODipine (NORVASC) tablet 10 mg  10 mg Oral Daily Etta Quill, DO   10 mg at 07/27/15 1052  . antiseptic oral rinse (CPC / CETYLPYRIDINIUM CHLORIDE 0.05%) solution 7 mL  7 mL Mouth Rinse BID Ripudeep K Rai, MD   7 mL at 07/27/15 1053  . atenolol (TENORMIN) tablet 25 mg  25 mg Oral QHS Jonetta Osgood, MD   25 mg at 07/26/15 2200  . cholecalciferol (VITAMIN D) tablet 1,000 Units  1,000 Units Oral  Daily Etta Quill, DO   1,000 Units at 07/27/15 1052  . clonazePAM (KLONOPIN) tablet 1 mg  1 mg Oral Daily Etta Quill, DO   1 mg at 07/27/15 1052  . clonazePAM (KLONOPIN) tablet 2 mg  2 mg Oral QPM Etta Quill, DO   2 mg at 07/26/15 1846  . enoxaparin (LOVENOX) injection 40 mg  40 mg Subcutaneous Q24H Jonetta Osgood, MD   40 mg at 07/27/15 1418  . ertapenem (INVANZ) 1 g in sodium chloride 0.9 % 50 mL IVPB  1 g Intravenous Q24H Jonetta Osgood, MD   1 g at 07/27/15 1418  . escitalopram (LEXAPRO) tablet 20 mg  20 mg Oral Daily Etta Quill, DO   20 mg at 07/27/15 1052  . famotidine (PEPCID) tablet 20 mg  20 mg Oral  QHS Etta Quill, DO   20 mg at 07/26/15 2200  . levothyroxine (SYNTHROID, LEVOTHROID) tablet 50 mcg  50 mcg Oral QAC breakfast Etta Quill, DO   50 mcg at 07/27/15 0849  . ondansetron (ZOFRAN) tablet 4 mg  4 mg Oral Q8H PRN Etta Quill, DO      . oxyCODONE-acetaminophen (PERCOCET/ROXICET) 5-325 MG per tablet 1 tablet  1 tablet Oral Q4H PRN Erenest Blank, RPH       And  . oxyCODONE (Oxy IR/ROXICODONE) immediate release tablet 5 mg  5 mg Oral Q4H PRN Erenest Blank, RPH   5 mg at 07/27/15 0246  . pantoprazole (PROTONIX) EC tablet 40 mg  40 mg Oral Q1200 Jonetta Osgood, MD   40 mg at 07/27/15 1235     Discharge Medications: Please see discharge summary for a list of discharge medications.  Relevant Imaging Results:  Relevant Lab Results:  Recent Labs    Additional [place], LCSW

## 2015-07-27 NOTE — Progress Notes (Signed)
Ms. [name] was walking in the hall with PT when patient stopped talking and felt like she "needed to sit down." Patient stated she felt like she had a "funny feeling in her head" and "things went black just for an instant." Her sp02 was 95% on 2L 02. Her blood pressure is 125/82 and pulse of 120. She now now resting in bed. MD notified.   Alex Gardener, RN

## 2015-07-27 NOTE — Progress Notes (Signed)
Physical Therapy Treatment Patient Details Name: [name] MRN: [id number] DOB: [birth year] Today's Date: 07/27/2015    History of Present Illness Patient is a 69 y/o female who presents with dysuria, decreased appetite over past several days and chills. Went to Urgent care who transferred her to ED with "dehydration syndrome". UA positive for UTI. PMH includes ORIF left wrist ~2 weeks ago, tremors, breast ca, seizures, stroke.    PT Comments    Pt with brief period of decr responsiveness with amb. Pt lives alone and initially had mentioned she might be able to stay with her son at DC. Pt doesn't feel like that is a viable option. Pt agreeable to ST-SNF.  Follow Up Recommendations  SNF     Equipment Recommendations  None recommended by PT    Recommendations for Other Services OT consult     Precautions / Restrictions Precautions Precautions: Fall Restrictions Weight Bearing Restrictions: Yes LUE Weight Bearing: Non weight bearing Other Position/Activity Restrictions: WB status from prior admission    Mobility  Bed Mobility Overal bed mobility: Needs Assistance Bed Mobility: Sit to Supine       Sit to supine: Min assist   General bed mobility comments: Assist to bring legs up into bed.  Transfers Overall transfer level: Needs assistance Equipment used: 4-wheeled walker Transfers: Sit to/from Stand Sit to Stand: Min guard Stand pivot transfers: Min assist       General transfer comment: Assist for balance and safety  Ambulation/Gait Ambulation/Gait assistance: Min guard;Min assist Ambulation Distance (Feet): 100 Feet Assistive device: 4-wheeled walker Gait Pattern/deviations: Step-through pattern;Decreased stride length Gait velocity: decreased Gait velocity interpretation: Below normal speed for age/gender General Gait Details: Mildly unsteady gait. Used rollator with left hand just resting on handle and not bearing weight. After amb 100' pt abruptly  stopped talking and I had pt turn and sit on seat of rollator. Pt with 5-10 seconds of unresponsiveness. When name called loudly pt became alert again and stated she has seizures like that. Pushed pt back to  room on rollator seat and assisted back to bed with nursing present. Began amb on RA but SpO2 dropped to 86% and replaced O2 at 2L and SpO2 returned to 92%.    Stairs            Wheelchair Mobility    Modified Rankin (Stroke Patients Only)       Balance   Sitting-balance support: Feet supported;No upper extremity supported Sitting balance-Leahy Scale: Good     Standing balance support: During functional activity Standing balance-Leahy Scale: Fair                      Cognition Arousal/Alertness: Awake/alert Behavior During Therapy: WFL for tasks assessed/performed Overall Cognitive Status: No family/caregiver present to determine baseline cognitive functioning Area of Impairment: Problem solving             Problem Solving: Slow processing      Exercises      General Comments        Pertinent Vitals/Pain Pain Assessment: No/denies pain    Home Living                      Prior Function            PT Goals (current goals can now be found in the care plan section) Progress towards PT goals: Not progressing toward goals - comment (brief period of unresponsiveness)    Frequency  Min 2X/week    PT Plan Discharge plan needs to be updated;Frequency needs to be updated    Co-evaluation             End of Session Equipment Utilized During Treatment: Gait belt;Oxygen Activity Tolerance: Treatment limited secondary to medical complications (Comment) (Short period of decr responsiveness.) Patient left: in bed;with call bell/phone within reach;with nursing/sitter in room     Time: 4098-1191 PT Time Calculation (min) (ACUTE ONLY): 21 min  Charges:  $Gait Training: 8-22 mins                    G Codes:       [name] 08-22-15, 3:45 PM [employer] PT [phone]

## 2015-07-27 NOTE — Clinical Social Work Note (Signed)
Clinical Social Work Assessment  Patient Details  Name: [name] MRN: [id number] Date of Birth: 07-28-1946  Date of referral:  07/27/15               Reason for consult:  Facility Placement                Permission sought to share information with:  Family Supports, Customer service manager Permission granted to share information::  Yes, Verbal Permission Granted  Name::     [name]  Agency::  [facility] SNF's  Relationship::  Daughter  Contact Information:  [phone]  Housing/Transportation Living arrangements for the past 2 months:  Apartment Source of Information:  Patient Patient Interpreter Needed:  None Criminal Activity/Legal Involvement Pertinent to Current Situation/Hospitalization:  No - Comment as needed Significant Relationships:  Adult Children, Neighbor, [employer], [employer] Lives with:  Self Do you feel safe going back to the place where you live?  No Need for family participation in patient care:  Yes (Comment)  Care giving concerns:  CSW received referral for possible SNF placement at time of discharge. CSW met with patient regarding PT recommendation of SNF placement at time of discharge. Per patient, patient is currently unable to care for herself at home given patient's current need for IV antibiotics. Patient expressed understanding of PT recommendation and is agreeable to SNF placement at time of discharge. CSW to continue to follow and assist with discharge planning needs.   Social Worker assessment / plan:  CSW spoke with patient concerning possibility of rehab at [facility] before returning home.  Employment status:  Retired Forensic scientist:  Other (Comment Required) (Generic Medicare advantage) PT Recommendations:  [place] / Referral to community resources:  [place]  Patient/Family's Response to care:  Patient recognizes need for rehab before returning home and is agreeable to a SNF  in [place]. Patient hopes to be back at her apartment in time for her community's holiday parties.  Patient/Family's Understanding of and Emotional Response to Diagnosis, Current Treatment, and Prognosis:  Patient is realistic regarding therapy needs. No questions/concerns about plan or treatment.    Emotional Assessment Appearance:  Appears stated age Attitude/Demeanor/Rapport:    Affect (typically observed):  Appropriate, Accepting Orientation:  Oriented to Self, Oriented to Place, Oriented to  Time, Oriented to Situation Alcohol / Substance use:  Not Applicable Psych involvement (Current and /or in the community):  No (Comment)  Discharge Needs  Concerns to be addressed:  Care Coordination Readmission within the last 30 days:  Yes Current discharge risk:  None Barriers to Discharge:  Continued Medical Work up   [employer], LCSW 07/27/2015, 4:42 PM

## 2015-07-28 ENCOUNTER — Inpatient Hospital Stay (HOSPITAL_COMMUNITY): Payer: Medicare (Managed Care)

## 2015-07-28 DIAGNOSIS — R7881 Bacteremia: Secondary | ICD-10-CM

## 2015-07-28 MED ORDER — HYDRALAZINE HCL 20 MG/ML IJ SOLN: 10.0000 mg | Freq: Four times a day (QID) | INTRAMUSCULAR | Status: DC | PRN

## 2015-07-28 NOTE — Care Management Important Message (Signed)
Important Message  Patient Details  Name: [name] MRN: [id number] Date of Birth: 13-Nov-1945   Medicare Important Message Given:  Yes    Zenon Mayo, RN 07/28/2015, 4:48 [place] Message  Patient Details  Name: [name] MRN: [id number] Date of Birth: 06/23/1946   Medicare Important Message Given:  Yes    Zenon Mayo, RN 07/28/2015, 4:48 PM

## 2015-07-28 NOTE — Progress Notes (Signed)
Echocardiogram 2D Echocardiogram has been performed.  [name] 07/28/2015, 12:38 PM

## 2015-07-28 NOTE — Progress Notes (Signed)
PATIENT DETAILS Name: [name] Age: 69 y.o. Sex: female Date of Birth: 28-Dec-1945 Admit Date: 07/24/2015 Admitting Physician Etta Quill, DO [id number], Almyra Free, NP  Subjective: Feels better.  Assessment/Plan: Principal Problem: E Coli Bacteremia secondary to UTI (ESBL E Coli):Patient with history of chronic urinary incontinence presented to the ED with dysuria, decreased appetite and chills x 3 days.UA was positive for UTI.Urine culture positive for E Coli ESBL,Blood culture positive for Escherichia coli. Continue Invanz-2 weeks from 12/1. Will place PICC line on 12/3, plans are for discharge to SNF over the weekend. Much improved, leukocytosis has resolved, her fever curve significantly better. Continue supportive measures.   Active Problems: Sleep apnea:Continue CPAP   [id number] during admission.Continue Amlodipine, Atenolol  Hypothyroidism:Continue Levothryoxine  Depression:Stable, denies SI/HI.Continue Lexapro, Klonopin   Thrombocytopenia: Appears to be mild and chronic-stable for outpatient follow-up. Watch closely while on VTE prophylaxis  Tremors: Chronic issue-claims she takes Klonopin.  Left wrist fracture: Underwent ORIF on 11/12-seen by Dr Apolonio Schneiders on 12/1 recommendations are to continue with current splint, no weight on right wrist, okay to use fingers. Follow-up in his office in one week.  History of left breast cancer:Follows with Dr Lindi Adie  [id number] CPAP qhs  Disposition: Remain inpatient-SNF ?tomorrow if clinical improvement continues  Antimicrobial agents  See below  Anti-infectives    Start     Dose/Rate Route Frequency Ordered Stop   07/27/15 1400  ertapenem (INVANZ) 1 g in sodium chloride 0.9 % 50 mL IVPB     1 g 100 mL/hr over 30 Minutes Intravenous Every 24 hours 07/27/15 1302     07/26/15 2200  cefTRIAXone (ROCEPHIN) 2 g in dextrose 5 % 50 mL IVPB  Status:  Discontinued     2 g 100 mL/hr over 30 Minutes Intravenous  Every 24 hours 07/26/15 0742 07/27/15 1302   07/26/15 0145  cefTRIAXone (ROCEPHIN) 1 g in dextrose 5 % 50 mL IVPB  Status:  Discontinued     1 g 100 mL/hr over 30 Minutes Intravenous Every 24 hours 07/25/15 0144 07/25/15 0144   07/25/15 0144  cefTRIAXone (ROCEPHIN) 1 g in dextrose 5 % 50 mL IVPB  Status:  Discontinued     1 g 100 mL/hr over 30 Minutes Intravenous Every 24 hours 07/25/15 0144 07/26/15 0742   07/25/15 0000  cefTRIAXone (ROCEPHIN) 1 g in dextrose 5 % 50 mL IVPB     1 g 100 mL/hr over 30 Minutes Intravenous  Once 07/24/15 2346 07/25/15 0124      DVT Prophylaxis: Prophylaxis Lovenox  Code Status: Full code   Family Communication Daughter in law over the phone 12/2  Procedures: None  CONSULTS:  None  Time spent 20 minutes-Greater than 50% of this time was spent in counseling, explanation of diagnosis, planning of further management, and coordination of care.  MEDICATIONS: Scheduled Meds: . amLODipine  10 mg Oral Daily  . antiseptic oral rinse  7 mL Mouth Rinse BID  . atenolol  25 mg Oral QHS  . cholecalciferol  1,000 Units Oral Daily  . clonazePAM  1 mg Oral Daily  . clonazePAM  2 mg Oral QPM  . enoxaparin (LOVENOX) injection  40 mg Subcutaneous Q24H  . ertapenem  1 g Intravenous Q24H  . escitalopram  20 mg Oral Daily  . famotidine  20 mg Oral QHS  . levothyroxine  50 mcg Oral QAC breakfast  . pantoprazole  40 mg  Oral Q1200   Continuous Infusions: . sodium chloride 20 mL/hr at 07/26/15 1119   PRN Meds:.acetaminophen, alum & mag hydroxide-simeth, ondansetron, oxyCODONE-acetaminophen **AND** oxyCODONE    PHYSICAL EXAM: Vital signs in last 24 hours: Filed Vitals:   07/27/15 1136 07/27/15 1721 07/27/15 2052 07/28/15 0629  BP: 125/82 118/73 123/74 125/77  Pulse: 120 68 60 60  Temp:  100.7 F (38.2 C) 98.7 F (37.1 C) 98.1 F (36.7 C)  TempSrc:  Oral Oral Oral  Resp:  16 20 18   Height:      Weight:      SpO2:  94% 90% 90%    Weight change:    Filed Weights   07/24/15 2054 07/25/15 0239  Weight: 108.863 kg (240 lb) 108.3 kg (238 lb 12.1 oz)   Body mass index is 42.3 kg/(m^2).   Gen Exam: Pleasant female, sitting in recliner, NAD  Neck: Supple, No JVD.   Chest: B/L Clear.   CVS: S1 S2 Regular, no murmurs.  Abdomen: soft, BS +, non tender, non distended.  Extremities: no edema, lower extremities warm to touch. Neurologic: Non Focal.   Skin: No Rash.   Wounds: N/A.    Intake/Output from previous day:  Intake/Output Summary (Last 24 hours) at 07/28/15 1350 Last data filed at 07/28/15 0900  Gross per 24 hour  Intake    880 ml  Output    400 ml  Net    480 ml     LAB RESULTS: CBC  Recent Labs Lab 07/24/15 2220 07/24/15 2225 07/25/15 0530 07/26/15 0415 07/27/15 0541  WBC  --  9.7 12.2* 10.4 6.6  HGB 16.7* 15.3* 15.4* 13.7 13.2  HCT 49.0* 47.1* 48.2* 42.6 41.8  PLT  --  130* 134* 116* 122*  MCV  --  97.9 98.4 98.4 98.6  MCH  --  31.8 31.4 31.6 31.1  MCHC  --  32.5 32.0 32.2 31.6  RDW  --  14.5 14.8 14.7 14.8  LYMPHSABS  --  0.8  --   --   --   MONOABS  --  0.7  --   --   --   EOSABS  --  0.0  --   --   --   BASOSABS  --  0.0  --   --   --     Chemistries   Recent Labs Lab 07/24/15 2220 07/25/15 0042 07/25/15 0530 07/26/15 0415 07/27/15 0541  NA 140 139 141 139 139  K 3.8 3.9 4.3 4.0 4.2  CL 99* 104 104 105 106  CO2  --  28 28 27 27   GLUCOSE 138* 161* 136* 115* 125*  BUN 14 11 12 11 12   CREATININE 1.00 1.10* 1.17* 0.96 0.99  CALCIUM  --  8.4* 8.7* 8.1* 8.1*    CBG: No results for input(s): GLUCAP in the last 168 hours.  GFR Estimated Creatinine Clearance: 63.3 mL/min (by C-G formula based on Cr of 0.99).   Urine Studies No results for input(s): UHGB, CRYS in the last 72 hours.  Invalid input(s): UACOL, UAPR, USPG, UPH, UTP, UGL, UKET, UBIL, UNIT, UROB, ULEU, UEPI, UWBC, URBC, UBAC, CAST, UCOM, BILUA   MICROBIOLOGY: Recent Results (from the past 240 hour(s))  Urine culture      Status: None   Collection Time: 07/24/15 10:41 PM  Result Value Ref Range Status   Specimen Description URINE, CLEAN CATCH  Final   Special Requests NONE  Final   Culture   Final    >=100,000  COLONIES/mL ESCHERICHIA COLI Confirmed Extended Spectrum Beta-Lactamase Producer (ESBL)    Report Status 07/27/2015 FINAL  Final   Organism ID, Bacteria ESCHERICHIA COLI  Final      Susceptibility   Escherichia coli - MIC*    AMPICILLIN >=32 RESISTANT Resistant     CEFAZOLIN >=64 RESISTANT Resistant     CEFTRIAXONE >=64 RESISTANT Resistant     CIPROFLOXACIN <=0.25 SENSITIVE Sensitive     GENTAMICIN <=1 SENSITIVE Sensitive     IMIPENEM <=0.25 SENSITIVE Sensitive     NITROFURANTOIN <=16 SENSITIVE Sensitive     TRIMETH/SULFA <=20 SENSITIVE Sensitive     AMPICILLIN/SULBACTAM >=32 RESISTANT Resistant     PIP/TAZO <=4 SENSITIVE Sensitive     * >=100,000 COLONIES/mL ESCHERICHIA COLI  Culture, blood (routine x 2)     Status: None (Preliminary result)   Collection Time: 07/25/15 10:35 AM  Result Value Ref Range Status   Specimen Description BLOOD RIGHT ANTECUBITAL  Final   Special Requests BOTTLES DRAWN AEROBIC ONLY 5CC  Final   Culture  Setup Time   Final    GRAM NEGATIVE RODS AEROBIC BOTTLE ONLY CRITICAL RESULT CALLED TO, READ BACK BY AND VERIFIED WITH: V ROBERTSON @0245  07/26/15 MKELLY    Culture ESCHERICHIA COLI SENSITIVITIES REPEATING   Final   Report Status PENDING  Incomplete  Culture, blood (routine x 2)     Status: None (Preliminary result)   Collection Time: 07/25/15 10:40 AM  Result Value Ref Range Status   Specimen Description BLOOD RIGHT HAND  Final   Special Requests IN PEDIATRIC BOTTLE 2CC  Final   Culture NO GROWTH 3 DAYS  Final   Report Status PENDING  Incomplete    RADIOLOGY STUDIES/RESULTS: Dg Chest 2 View  07/24/2015  CLINICAL DATA:  Fever.  Urinary tract symptoms. EXAM: CHEST  2 VIEW COMPARISON:  03/20/2015 FINDINGS: There is moderate cardiomegaly and aortic  tortuosity. The lungs are clear. There is no pleural effusion. Hilar and mediastinal contours are unremarkable and unchanged. IMPRESSION: Cardiomegaly.  No acute findings. Electronically Signed   By: Andreas Newport M.D.   On: 07/24/2015 21:42    Oren Binet, PA-S [facility]  07/28/2015, 1:50 PM   LOS: 3 days

## 2015-07-28 NOTE — Progress Notes (Signed)
Patient is refusing CPAP for tonight. 

## 2015-07-29 DIAGNOSIS — R7881 Bacteremia: Secondary | ICD-10-CM | POA: Insufficient documentation

## 2015-07-29 LAB — CULTURE, BLOOD (ROUTINE X 2)

## 2015-07-29 MED ORDER — OXYCODONE-ACETAMINOPHEN 10-325 MG PO TABS
1.0000 | ORAL_TABLET | Freq: Four times a day (QID) | ORAL | Status: DC | PRN
Start: 2015-07-29 — End: 2016-01-29

## 2015-07-29 MED ORDER — SODIUM CHLORIDE 0.9 % IV SOLN: 1.0000 g | INTRAVENOUS | Status: DC

## 2015-07-29 MED ORDER — CLONAZEPAM 2 MG PO TABS: 1.0000 mg | ORAL_TABLET | ORAL | Status: DC

## 2015-07-29 NOTE — Progress Notes (Signed)
Pt refusing cpap tonight

## 2015-07-29 NOTE — Discharge Summary (Addendum)
PATIENT DETAILS Name: [name] Age: 69 y.o. Sex: female Date of Birth: 1946/08/16 MRN: [id number]. Admitting Physician: Etta Quill, DO [id number], Almyra Free, NP  Admit Date: 07/24/2015 Discharge date: 08/01/2015  Recommendations for Outpatient Follow-up:  1. Please repeat blood cultures once patient has completed IV Invanz to document clearance of bacteremia. 2. 2 weeks of IV Invanz from 07/27/15 3. Please repeat CBC/chemistries weekly while on IV antibiotics. 4. Please remove PICC line once patient completes duration of IV antibiotics  5. Please ensure follow-up with orthopedics in 1 week  6. Please follow blood cultures done on 12/3 till negative  PRIMARY DISCHARGE DIAGNOSIS:  Principal Problem:   Sepsis secondary to UTI [facility]) Active Problems:   Sleep apnea in adult   UTI (lower urinary tract infection)   Bacteremia      PAST MEDICAL HISTORY: Past Medical History  Diagnosis Date  . Tremor   . Bowel incontinence   . Bladder incontinence   . High blood pressure   . Anemia   . Depression   . Arthritis   . Cancer [facility])     breast cancer  . GERD (gastroesophageal reflux disease)   . Full dentures   . HOH (hard of hearing)   . Complication of anesthesia     hard to wake up  . Anxiety   . Chronic diarrhea   . Breast cancer ([place]) 08/16/13    left, 1 o'clock  . History of radiation therapy 11/02/13- 12/24/13    left breast 4680 cGy in 26 sessions, left breast boost 1400 cGy in 7 sessions  . Stroke ([place]) 1980    legs weak  . Sleep apnea     has a cpap-5 hr/ not used in years  . Pneumonia     hx  . Hypothyroidism   . Urinary incontinence   . Shortness of breath     when walks  . Seizures ([place])     no meds    DISCHARGE MEDICATIONS: Current Discharge Medication List    START taking these medications   Details  ertapenem 1 g in sodium chloride 0.9 % 50 mL Inject 1 g into the vein daily. 2 weeks from 07/27/15      CONTINUE these medications which have  CHANGED   Details  clonazePAM (KLONOPIN) 2 MG tablet Take 0.5-1 tablets (1-2 mg total) by mouth See admin instructions. Patient states she takes 1mg  in the morning then she will take two of the 2mg  in the evening (Do not give if lethargy or sedation) Qty: 20 tablet, Refills: 0    oxyCODONE-acetaminophen (PERCOCET) 10-325 MG tablet Take 1 tablet by mouth every 6 (six) hours as needed for pain. Qty: 20 tablet, Refills: 0      CONTINUE these medications which have NOT CHANGED   Details  amLODipine (NORVASC) 5 MG tablet Take 10 mg by mouth daily.     atenolol (TENORMIN) 25 MG tablet Take 25 mg by mouth at bedtime.     Cetirizine HCl (ZYRTEC ALLERGY PO) Take 1 tablet by mouth at bedtime as needed (allergies).     cholecalciferol (VITAMIN D) 1000 UNITS tablet Take 1,000 Units by mouth daily.    escitalopram (LEXAPRO) 20 MG tablet Take 20 mg by mouth daily.    levothyroxine (SYNTHROID, LEVOTHROID) 50 MCG tablet Take 50 mcg by mouth daily before breakfast.     Loperamide HCl (IMODIUM PO) Take 1 tablet by mouth daily as needed (diarrhea).    naproxen sodium (ANAPROX) 220  MG tablet Take 220 mg by mouth 2 (two) times daily as needed (pain).    ondansetron (ZOFRAN) 4 MG tablet Take 1 tablet (4 mg total) by mouth every 8 (eight) hours as needed for nausea or vomiting. Qty: 20 tablet, Refills: 0    ranitidine (ZANTAC) 150 MG capsule Take 150 mg by mouth every evening.    vitamin C (ASCORBIC ACID) 500 MG tablet Take 1 tablet (500 mg total) by mouth daily. Qty: 50 tablet, Refills: 0        ALLERGIES:   Allergies  Allergen Reactions  . Codeine Nausea And Vomiting    BRIEF HPI:  See H&P, Labs, Consult and Test reports for all details in brief, patient is a 69 year old female with history of chronic urinary incontinence presented to the ED with dysuria, decreased appetite and chills x 3 days.UA was positive for UTI. She was admitted for further evaluation and treatment   CONSULTATIONS:     None  PERTINENT RADIOLOGIC STUDIES: Dg Chest 2 View  07/24/2015  CLINICAL DATA:  Fever.  Urinary tract symptoms. EXAM: CHEST  2 VIEW COMPARISON:  03/20/2015 FINDINGS: There is moderate cardiomegaly and aortic tortuosity. The lungs are clear. There is no pleural effusion. Hilar and mediastinal contours are unremarkable and unchanged. IMPRESSION: Cardiomegaly.  No acute findings. Electronically Signed   By: Andreas Newport M.D.   On: 07/24/2015 21:42     PERTINENT LAB RESULTS: CBC: No results for input(s): WBC, HGB, HCT, PLT in the last 72 hours. CMET CMP     Component Value Date/Time   NA 139 07/27/2015 0541   NA 144 09/01/2013 1221   K 4.2 07/27/2015 0541   K 4.2 09/01/2013 1221   CL 106 07/27/2015 0541   CO2 27 07/27/2015 0541   CO2 30* 09/01/2013 1221   GLUCOSE 125* 07/27/2015 0541   GLUCOSE 82 09/01/2013 1221   BUN 12 07/27/2015 0541   BUN 18.8 09/01/2013 1221   CREATININE 0.99 07/27/2015 0541   CREATININE 1.3* 09/01/2013 1221   CALCIUM 8.1* 07/27/2015 0541   CALCIUM 9.2 09/01/2013 1221   PROT 6.1* 07/25/2015 0042   PROT 7.1 09/01/2013 1221   ALBUMIN 3.2* 07/25/2015 0042   ALBUMIN 3.7 09/01/2013 1221   AST 17 07/25/2015 0042   AST 17 09/01/2013 1221   ALT 18 07/25/2015 0042   ALT 16 09/01/2013 1221   ALKPHOS 43 07/25/2015 0042   ALKPHOS 48 09/01/2013 1221   BILITOT 1.2 07/25/2015 0042   BILITOT 0.78 09/01/2013 1221   GFRNONAA 57* 07/27/2015 0541   GFRAA >60 07/27/2015 0541    GFR Estimated Creatinine Clearance: 63.3 mL/min (by C-G formula based on Cr of 0.99). No results for input(s): LIPASE, AMYLASE in the last 72 hours. No results for input(s): CKTOTAL, CKMB, CKMBINDEX, TROPONINI in the last 72 hours. Invalid input(s): POCBNP No results for input(s): DDIMER in the last 72 hours. No results for input(s): HGBA1C in the last 72 hours. No results for input(s): CHOL, HDL, LDLCALC, TRIG, CHOLHDL, LDLDIRECT in the last 72 hours. No results for input(s): TSH,  T4TOTAL, T3FREE, THYROIDAB in the last 72 hours.  Invalid input(s): FREET3 No results for input(s): VITAMINB12, FOLATE, FERRITIN, TIBC, IRON, RETICCTPCT in the last 72 hours. Coags: No results for input(s): INR in the last 72 hours.  Invalid input(s): PT Microbiology: Recent Results (from the past 240 hour(s))  Urine culture     Status: None   Collection Time: 07/24/15 10:41 PM  Result Value Ref Range Status  Specimen Description URINE, CLEAN CATCH  Final   Special Requests NONE  Final   Culture   Final    >=100,000 COLONIES/mL ESCHERICHIA COLI Confirmed Extended Spectrum Beta-Lactamase Producer (ESBL)    Report Status 07/27/2015 FINAL  Final   Organism ID, Bacteria ESCHERICHIA COLI  Final      Susceptibility   Escherichia coli - MIC*    AMPICILLIN >=32 RESISTANT Resistant     CEFAZOLIN >=64 RESISTANT Resistant     CEFTRIAXONE >=64 RESISTANT Resistant     CIPROFLOXACIN <=0.25 SENSITIVE Sensitive     GENTAMICIN <=1 SENSITIVE Sensitive     IMIPENEM <=0.25 SENSITIVE Sensitive     NITROFURANTOIN <=16 SENSITIVE Sensitive     TRIMETH/SULFA <=20 SENSITIVE Sensitive     AMPICILLIN/SULBACTAM >=32 RESISTANT Resistant     PIP/TAZO <=4 SENSITIVE Sensitive     * >=100,000 COLONIES/mL ESCHERICHIA COLI  Culture, blood (routine x 2)     Status: None   Collection Time: 07/25/15 10:35 AM  Result Value Ref Range Status   Specimen Description BLOOD RIGHT ANTECUBITAL  Final   Special Requests BOTTLES DRAWN AEROBIC ONLY 5CC  Final   Culture  Setup Time   Final    GRAM NEGATIVE RODS AEROBIC BOTTLE ONLY CRITICAL RESULT CALLED TO, READ BACK BY AND VERIFIED WITH: V ROBERTSON @0245  07/26/15 MKELLY    Culture   Final    ESCHERICHIA COLI Confirmed Extended Spectrum Beta-Lactamase Producer (ESBL)    Report Status 07/29/2015 FINAL  Final   Organism ID, Bacteria ESCHERICHIA COLI  Final      Susceptibility   Escherichia coli - MIC*    AMPICILLIN >=32 RESISTANT Resistant     CEFAZOLIN >=64  RESISTANT Resistant     CEFEPIME 2 RESISTANT Resistant     CEFTAZIDIME <=1 RESISTANT Resistant     CEFTRIAXONE >=64 RESISTANT Resistant     CIPROFLOXACIN <=0.25 SENSITIVE Sensitive     GENTAMICIN <=1 SENSITIVE Sensitive     IMIPENEM <=0.25 SENSITIVE Sensitive     TRIMETH/SULFA <=20 SENSITIVE Sensitive     AMPICILLIN/SULBACTAM >=32 RESISTANT Resistant     PIP/TAZO <=4 SENSITIVE Sensitive     * ESCHERICHIA COLI  Culture, blood (routine x 2)     Status: None   Collection Time: 07/25/15 10:40 AM  Result Value Ref Range Status   Specimen Description BLOOD RIGHT HAND  Final   Special Requests IN PEDIATRIC BOTTLE 2CC  Final   Culture NO GROWTH 5 DAYS  Final   Report Status 07/30/2015 FINAL  Final  Culture, blood (routine x 2)     Status: None (Preliminary result)   Collection Time: 07/29/15  5:09 PM  Result Value Ref Range Status   Specimen Description BLOOD RIGHT HAND  Final   Special Requests IN PEDIATRIC BOTTLE 3CC  Final   Culture NO GROWTH 2 DAYS  Final   Report Status PENDING  Incomplete  Culture, blood (routine x 2)     Status: None (Preliminary result)   Collection Time: 07/29/15  5:12 PM  Result Value Ref Range Status   Specimen Description BLOOD RIGHT WRIST  Final   Special Requests IN PEDIATRIC BOTTLE 3CC  Final   Culture NO GROWTH 2 DAYS  Final   Report Status PENDING  Incomplete     BRIEF HOSPITAL COURSE:  Sepsis secondary to ESBL Escherichia coli UTI with bacteremia: Patient with history of chronic urinary incontinence presented to the ED with dysuria, decreased appetite and chills x 3 days.UA was positive for UTI.Urine  and blood cultures are positive for  E Coli ESBL. Plans are to Continue Invanz-2 weeks from 12/1. Case was discussed with infectious disease-Dr. Johnnye Sima, who reviewed the chart and was agreeable with the outlined plan. PICC line was placed on 12/4, plans are for discharge to SNF once bed  is available. She has significantly improved, no further fever,  leukocytosis has resolved.   Sleep apnea:Continue CPAP qhs  [id number] during admission.Continue Amlodipine, Atenolol  Hypothyroidism:Continue Levothryoxine  Depression:Stable, denies SI/HI.Continue Lexapro, Klonopin   Thrombocytopenia: Appears to be mild and chronic-stable for outpatient follow-up.   Tremors: Chronic issue-claims she takes Klonopin.  Left wrist fracture: Underwent ORIF on 11/12-seen by Dr Apolonio Schneiders on 12/1 recommendations are to continue with current splint, no weight on right wrist, okay to use fingers. Follow-up in his office in one week.  History of left breast cancer:Follows with Dr Lindi Adie  Chronic urinary incontinence: Continue follow-up with urology-patient already seeing a urologist in [place]:  Subjective:   [name] today has no headache,no chest abdominal pain,no new weakness tingling or numbness, feels much better wants to go home today.   Objective:   Blood pressure 138/71, pulse 58, temperature 98.3 F (36.8 C), temperature source Oral, resp. rate 18, height 5\' 3"  (1.6 m), weight 108.3 kg (238 lb 12.1 oz), SpO2 93 %.  Intake/Output Summary (Last 24 hours) at 08/01/15 1159 Last data filed at 08/01/15 0900  Gross per 24 hour  Intake    677 ml  Output    702 ml  Net    -25 ml   Filed Weights   07/24/15 2054 07/25/15 0239  Weight: 108.863 kg (240 lb) 108.3 kg (238 lb 12.1 oz)    Exam Awake Alert, Oriented *3, No new F.N deficits, Normal affect .AT,PERRAL Supple Neck,No JVD, No cervical lymphadenopathy appriciated.  Symmetrical Chest wall movement, Good air movement bilaterally, CTAB RRR,No Gallops,Rubs or new Murmurs, No Parasternal Heave +ve B.Sounds, Abd Soft, Non tender, No organomegaly appriciated, No rebound -guarding or rigidity. No Cyanosis, Clubbing or edema, No new Rash or bruise  DISCHARGE CONDITION: Stable  DISPOSITION: SNF  DISCHARGE INSTRUCTIONS:    CPAP QHS  Do not give narcotics or  sedatives/Benzodiazepines if sedated or lethargic  Activity:  As tolerated with Full fall precautions use walker/cane & assistance as needed-However no weight on right wrist, okay to use fingers.  Get Medicines reviewed and adjusted: Please take all your medications with you for your next visit with your Primary MD  Please request your Primary MD to go over all hospital tests and procedure/radiological results at the follow up, please ask your Primary MD to get all Hospital records sent to his/her office.  If you experience worsening of your admission symptoms, develop shortness of breath, life threatening emergency, suicidal or homicidal thoughts you must seek medical attention immediately by calling 911 or calling your MD immediately  if symptoms less severe.  You must read complete instructions/literature along with all the possible adverse reactions/side effects for all the Medicines you take and that have been prescribed to you. Take any new Medicines after you have completely understood and accpet all the possible adverse reactions/side effects.   Do not drive when taking Pain medications.   Do not take more than prescribed Pain, Sleep and Anxiety Medications  Special Instructions: If you have smoked or chewed Tobacco  in the last 2 yrs please stop smoking, stop any regular Alcohol  and or any Recreational drug use.  Wear  Seat belts while driving.  Please note  You were cared for by a hospitalist during your hospital stay. Once you are discharged, your primary care physician will handle any further medical issues. Please note that NO REFILLS for any discharge medications will be authorized once you are discharged, as it is imperative that you return to your primary care physician (or establish a relationship with a primary care physician if you do not have one) for your aftercare needs so that they can reassess your need for medications and monitor your lab values.   Diet  recommendation: Heart Healthy diet  Discharge Instructions    Call MD for:  persistant nausea and vomiting    Complete by:  As directed      Call MD for:  severe uncontrolled pain    Complete by:  As directed      Call MD for:  temperature >100.4    Complete by:  As directed      Diet - low sodium heart healthy    Complete by:  As directed      Increase activity slowly    Complete by:  As directed            Follow-up Information    Follow up with Alvester Chou, NP. Schedule an appointment as soon as possible for a visit in 2 weeks.   Specialty:  Nurse Practitioner   Why:  Hospital follow up   Contact information:   [facility] [place] [facility] [place] [address] [phone]       Follow up with Linna Hoff, MD. Schedule an appointment as soon as possible for a visit on 08/04/2015.   Specialty:  Orthopedic Surgery   Why:  Hospital follow up//Appointment with Dr. Caralyn Guile is on 08/04/15 at 12:15   Contact information:   [address] [place] [address] [phone]       Total Time spent on discharge equals 45 minutes.  SignedOren Binet 08/01/2015 11:59 AM

## 2015-07-29 NOTE — Progress Notes (Signed)
CSW has contacted [facility] to determining their willingness to accept pt on a LOG as she has [place] insurance and no [employer] fast streamer.  Await return call.

## 2015-07-29 NOTE — Progress Notes (Signed)
Pt unable to have PICC line placed today,  Noted.  LOG bed search continues.  [facility] and [facility] both in process of securing insurance auth, per CSW  Coverage from Friday, 12/2.  CSW will continue to follow for disposition.  Creta Levin, LCSW Saturday Coverage [id number]

## 2015-07-29 NOTE — Progress Notes (Signed)
PATIENT DETAILS Name: [name] Age: 69 y.o. Sex: female Date of Birth: 1945-11-21 Admit Date: 07/24/2015 Admitting Physician Etta Quill, DO [id number], Almyra Free, NP  Subjective: Feels better.  Assessment/Plan: Principal Problem: ESBL E Coli Bacteremia with UTI :Patient with history of chronic urinary incontinence presented to the ED with dysuria, decreased appetite and chills x 3 days.Urine culture/blood culture positive for E Coli ESBL. Place PICC line, plan for 2 weeks of IV Invanz. Case was discussed with Dr. Ileene Patrick over the phone. Social worker consulted for SNF placement   Active Problems: Sleep apnea:Continue CPAP   [id number] during admission.Continue Amlodipine, Atenolol  Hypothyroidism:Continue Levothryoxine  Depression:Stable, denies SI/HI.Continue Lexapro, Klonopin   Thrombocytopenia: Appears to be mild and chronic-stable for outpatient follow-up. Watch closely while on VTE prophylaxis  Tremors: Chronic issue-claims she takes Klonopin.  Left wrist fracture: Underwent ORIF on 11/12-seen by Dr Apolonio Schneiders on 12/1 recommendations are to continue with current splint, no weight on right wrist, okay to use fingers. Follow-up in his office in one week.  History of left breast cancer:Follows with Dr Lindi Adie  [id number] CPAP qhs  Disposition: Remain inpatient-SNF ?tomorrow if clinical improvement continues  Antimicrobial agents  See below  Anti-infectives    Start     Dose/Rate Route Frequency Ordered Stop   07/29/15 0000  ertapenem 1 g in sodium chloride 0.9 % 50 mL     1 g 100 mL/hr over 30 Minutes Intravenous Every 24 hours 07/29/15 1027     07/27/15 1400  ertapenem (INVANZ) 1 g in sodium chloride 0.9 % 50 mL IVPB     1 g 100 mL/hr over 30 Minutes Intravenous Every 24 hours 07/27/15 1302     07/26/15 2200  cefTRIAXone (ROCEPHIN) 2 g in dextrose 5 % 50 mL IVPB  Status:  Discontinued     2 g 100 mL/hr over 30 Minutes Intravenous Every 24 hours  07/26/15 0742 07/27/15 1302   07/26/15 0145  cefTRIAXone (ROCEPHIN) 1 g in dextrose 5 % 50 mL IVPB  Status:  Discontinued     1 g 100 mL/hr over 30 Minutes Intravenous Every 24 hours 07/25/15 0144 07/25/15 0144   07/25/15 0144  cefTRIAXone (ROCEPHIN) 1 g in dextrose 5 % 50 mL IVPB  Status:  Discontinued     1 g 100 mL/hr over 30 Minutes Intravenous Every 24 hours 07/25/15 0144 07/26/15 0742   07/25/15 0000  cefTRIAXone (ROCEPHIN) 1 g in dextrose 5 % 50 mL IVPB     1 g 100 mL/hr over 30 Minutes Intravenous  Once 07/24/15 2346 07/25/15 0124      DVT Prophylaxis: Prophylaxis Lovenox  Code Status: Full code   Family Communication Daughter in law over the phone 12/2  Procedures: None  CONSULTS:  None  Time spent 20 minutes-Greater than 50% of this time was spent in counseling, explanation of diagnosis, planning of further management, and coordination of care.  MEDICATIONS: Scheduled Meds: . amLODipine  10 mg Oral Daily  . antiseptic oral rinse  7 mL Mouth Rinse BID  . atenolol  25 mg Oral QHS  . cholecalciferol  1,000 Units Oral Daily  . clonazePAM  1 mg Oral Daily  . clonazePAM  2 mg Oral QPM  . enoxaparin (LOVENOX) injection  40 mg Subcutaneous Q24H  . ertapenem  1 g Intravenous Q24H  . escitalopram  20 mg Oral Daily  . famotidine  20 mg Oral  QHS  . levothyroxine  50 mcg Oral QAC breakfast   Continuous Infusions: . sodium chloride 20 mL/hr at 07/26/15 1119   PRN Meds:.acetaminophen, alum & mag hydroxide-simeth, hydrALAZINE, ondansetron, oxyCODONE-acetaminophen **AND** oxyCODONE    PHYSICAL EXAM: Vital signs in last 24 hours: Filed Vitals:   07/28/15 2254 07/29/15 0520 07/29/15 1119 07/29/15 1309  BP: 125/67 140/92 135/83 123/65  Pulse: 59 64 67 62  Temp:  98.2 F (36.8 C)  97.6 F (36.4 C)  TempSrc:  Oral    Resp:  15  19  Height:      Weight:      SpO2:  87%  92%    Weight change:  Filed Weights   07/24/15 2054 07/25/15 0239  Weight: 108.863 kg  (240 lb) 108.3 kg (238 lb 12.1 oz)   Body mass index is 42.3 kg/(m^2).   Gen Exam: Pleasant female, sitting in recliner, NAD  Neck: Supple, No JVD.   Chest: B/L Clear.   CVS: S1 S2 Regular, no murmurs.  Abdomen: soft, BS +, non tender, non distended.  Extremities: no edema, lower extremities warm to touch. Neurologic: Non Focal.   Skin: No Rash.   Wounds: N/A.    Intake/Output from previous day:  Intake/Output Summary (Last 24 hours) at 07/29/15 1644 Last data filed at 07/29/15 1434  Gross per 24 hour  Intake    600 ml  Output    350 ml  Net    250 ml     LAB RESULTS: CBC  Recent Labs Lab 07/24/15 2220 07/24/15 2225 07/25/15 0530 07/26/15 0415 07/27/15 0541  WBC  --  9.7 12.2* 10.4 6.6  HGB 16.7* 15.3* 15.4* 13.7 13.2  HCT 49.0* 47.1* 48.2* 42.6 41.8  PLT  --  130* 134* 116* 122*  MCV  --  97.9 98.4 98.4 98.6  MCH  --  31.8 31.4 31.6 31.1  MCHC  --  32.5 32.0 32.2 31.6  RDW  --  14.5 14.8 14.7 14.8  LYMPHSABS  --  0.8  --   --   --   MONOABS  --  0.7  --   --   --   EOSABS  --  0.0  --   --   --   BASOSABS  --  0.0  --   --   --     Chemistries   Recent Labs Lab 07/24/15 2220 07/25/15 0042 07/25/15 0530 07/26/15 0415 07/27/15 0541  NA 140 139 141 139 139  K 3.8 3.9 4.3 4.0 4.2  CL 99* 104 104 105 106  CO2  --  28 28 27 27   GLUCOSE 138* 161* 136* 115* 125*  BUN 14 11 12 11 12   CREATININE 1.00 1.10* 1.17* 0.96 0.99  CALCIUM  --  8.4* 8.7* 8.1* 8.1*    CBG: No results for input(s): GLUCAP in the last 168 hours.  GFR Estimated Creatinine Clearance: 63.3 mL/min (by C-G formula based on Cr of 0.99).   Urine Studies No results for input(s): UHGB, CRYS in the last 72 hours.  Invalid input(s): UACOL, UAPR, USPG, UPH, UTP, UGL, UKET, UBIL, UNIT, UROB, ULEU, UEPI, UWBC, URBC, UBAC, CAST, UCOM, BILUA   MICROBIOLOGY: Recent Results (from the past 240 hour(s))  Urine culture     Status: None   Collection Time: 07/24/15 10:41 PM  Result Value Ref  Range Status   Specimen Description URINE, CLEAN CATCH  Final   Special Requests NONE  Final   Culture  Final    >=100,000 COLONIES/mL ESCHERICHIA COLI Confirmed Extended Spectrum Beta-Lactamase Producer (ESBL)    Report Status 07/27/2015 FINAL  Final   Organism ID, Bacteria ESCHERICHIA COLI  Final      Susceptibility   Escherichia coli - MIC*    AMPICILLIN >=32 RESISTANT Resistant     CEFAZOLIN >=64 RESISTANT Resistant     CEFTRIAXONE >=64 RESISTANT Resistant     CIPROFLOXACIN <=0.25 SENSITIVE Sensitive     GENTAMICIN <=1 SENSITIVE Sensitive     IMIPENEM <=0.25 SENSITIVE Sensitive     NITROFURANTOIN <=16 SENSITIVE Sensitive     TRIMETH/SULFA <=20 SENSITIVE Sensitive     AMPICILLIN/SULBACTAM >=32 RESISTANT Resistant     PIP/TAZO <=4 SENSITIVE Sensitive     * >=100,000 COLONIES/mL ESCHERICHIA COLI  Culture, blood (routine x 2)     Status: None   Collection Time: 07/25/15 10:35 AM  Result Value Ref Range Status   Specimen Description BLOOD RIGHT ANTECUBITAL  Final   Special Requests BOTTLES DRAWN AEROBIC ONLY 5CC  Final   Culture  Setup Time   Final    GRAM NEGATIVE RODS AEROBIC BOTTLE ONLY CRITICAL RESULT CALLED TO, READ BACK BY AND VERIFIED WITH: V ROBERTSON @0245  07/26/15 MKELLY    Culture   Final    ESCHERICHIA COLI Confirmed Extended Spectrum Beta-Lactamase Producer (ESBL)    Report Status 07/29/2015 FINAL  Final   Organism ID, Bacteria ESCHERICHIA COLI  Final      Susceptibility   Escherichia coli - MIC*    AMPICILLIN >=32 RESISTANT Resistant     CEFAZOLIN >=64 RESISTANT Resistant     CEFEPIME 2 RESISTANT Resistant     CEFTAZIDIME <=1 RESISTANT Resistant     CEFTRIAXONE >=64 RESISTANT Resistant     CIPROFLOXACIN <=0.25 SENSITIVE Sensitive     GENTAMICIN <=1 SENSITIVE Sensitive     IMIPENEM <=0.25 SENSITIVE Sensitive     TRIMETH/SULFA <=20 SENSITIVE Sensitive     AMPICILLIN/SULBACTAM >=32 RESISTANT Resistant     PIP/TAZO <=4 SENSITIVE Sensitive     * ESCHERICHIA  COLI  Culture, blood (routine x 2)     Status: None (Preliminary result)   Collection Time: 07/25/15 10:40 AM  Result Value Ref Range Status   Specimen Description BLOOD RIGHT HAND  Final   Special Requests IN PEDIATRIC BOTTLE 2CC  Final   Culture NO GROWTH 4 DAYS  Final   Report Status PENDING  Incomplete    RADIOLOGY STUDIES/RESULTS: Dg Chest 2 View  07/24/2015  CLINICAL DATA:  Fever.  Urinary tract symptoms. EXAM: CHEST  2 VIEW COMPARISON:  03/20/2015 FINDINGS: There is moderate cardiomegaly and aortic tortuosity. The lungs are clear. There is no pleural effusion. Hilar and mediastinal contours are unremarkable and unchanged. IMPRESSION: Cardiomegaly.  No acute findings. Electronically Signed   By: Andreas Newport M.D.   On: 07/24/2015 21:42    Oren Binet, PA-S [facility]  07/29/2015, 4:44 PM   LOS: 4 days

## 2015-07-30 LAB — CULTURE, BLOOD (ROUTINE X 2): Culture: NO GROWTH

## 2015-07-30 MED ORDER — SODIUM CHLORIDE 0.9 % IJ SOLN
10.0000 mL | INTRAMUSCULAR | Status: DC | PRN
  Administered 2015-07-31: 10 mL
  Filled 2015-07-30: qty 40

## 2015-07-30 NOTE — Progress Notes (Signed)
Peripherally Inserted Central Catheter/Midline Placement  The IV Nurse has discussed with the patient and/or persons authorized to consent for the patient, the purpose of this procedure and the potential benefits and risks involved with this procedure.  The benefits include less needle sticks, lab draws from the catheter and patient may be discharged home with the catheter.  Risks include, but not limited to, infection, bleeding, blood clot (thrombus formation), and puncture of an artery; nerve damage and irregular heat beat.  Alternatives to this procedure were also discussed.  PICC/Midline Placement Documentation  PICC Single Lumen [id number] PICC Right Basilic 41 cm 0 cm (Active)  Indication for Insertion or Continuance of Line Home intravenous therapies (PICC only) 07/30/2015  5:49 PM  Exposed Catheter (cm) 0 cm 07/30/2015  5:49 PM  Site Assessment Clean;Dry;Intact 07/30/2015  5:49 PM  Line Status Flushed;Saline locked;Blood return noted 07/30/2015  5:49 PM  Dressing Type Transparent 07/30/2015  5:49 PM  Dressing Status Clean;Dry;Intact;Antimicrobial disc in place 07/30/2015  5:49 PM  Line Care Connections checked and tightened 07/30/2015  5:49 PM  Line Adjustment (NICU/IV Team Only) No 07/30/2015  5:49 PM  Dressing Intervention New dressing 07/30/2015  5:49 PM  Dressing Change Due 08/06/15 07/30/2015  5:49 PM       Rolena Infante 07/30/2015, 5:50 PM

## 2015-07-30 NOTE — Progress Notes (Signed)
PATIENT DETAILS Name: [name] Age: 69 y.o. Sex: female Date of Birth: [birth year] Admit Date: 07/24/2015 Admitting Physician Etta Quill, DO [id number], Almyra Free, NP  Subjective: Feels better-no complaints-awaiting SNF bed  Assessment/Plan: Principal Problem: ESBL E Coli Bacteremia with UTI :Patient with history of chronic urinary incontinence presented to the ED with dysuria, decreased appetite and chills x 3 days.Urine culture/blood culture positive for E Coli ESBL. Place PICC line, plan for 2 weeks of IV Invanz. Afebrile, leukocytosis has resolved, and clinically much improved. Case was discussed with Dr. Johnnye Sima over the phone on 12/3. Awaiting SNF placement.   Active Problems: Sleep apnea:Continue CPAP   [id number] during admission.Continue Amlodipine, Atenolol  Hypothyroidism:Continue Levothryoxine  Depression:Stable, denies SI/HI.Continue Lexapro, Klonopin   Thrombocytopenia: Appears to be mild and chronic-stable for outpatient follow-up. Watch closely while on VTE prophylaxis  Tremors: Chronic issue-claims she takes Klonopin.  Left wrist fracture: Underwent ORIF on 11/12-seen by Dr Apolonio Schneiders on 12/1 recommendations are to continue with current splint, no weight on right wrist, okay to use fingers. Follow-up in his office in one week.  History of left breast cancer:Follows with Dr Lindi Adie  [id number] CPAP qhs  Disposition:SNF when bed available  Antimicrobial agents  See below  Anti-infectives    Start     Dose/Rate Route Frequency Ordered Stop   07/29/15 0000  ertapenem 1 g in sodium chloride 0.9 % 50 mL     1 g 100 mL/hr over 30 Minutes Intravenous Every 24 hours 07/29/15 1027     07/27/15 1400  ertapenem (INVANZ) 1 g in sodium chloride 0.9 % 50 mL IVPB     1 g 100 mL/hr over 30 Minutes Intravenous Every 24 hours 07/27/15 1302     07/26/15 2200  cefTRIAXone (ROCEPHIN) 2 g in dextrose 5 % 50 mL IVPB  Status:  Discontinued     2 g 100  mL/hr over 30 Minutes Intravenous Every 24 hours 07/26/15 0742 07/27/15 1302   07/26/15 0145  cefTRIAXone (ROCEPHIN) 1 g in dextrose 5 % 50 mL IVPB  Status:  Discontinued     1 g 100 mL/hr over 30 Minutes Intravenous Every 24 hours 07/25/15 0144 07/25/15 0144   07/25/15 0144  cefTRIAXone (ROCEPHIN) 1 g in dextrose 5 % 50 mL IVPB  Status:  Discontinued     1 g 100 mL/hr over 30 Minutes Intravenous Every 24 hours 07/25/15 0144 07/26/15 0742   07/25/15 0000  cefTRIAXone (ROCEPHIN) 1 g in dextrose 5 % 50 mL IVPB     1 g 100 mL/hr over 30 Minutes Intravenous  Once 07/24/15 2346 07/25/15 0124      DVT Prophylaxis: Prophylaxis Lovenox  Code Status: Full code   Family Communication Daughter in law over the phone 12/2  Procedures: None  CONSULTS:  None  Time spent 20 minutes-Greater than 50% of this time was spent in counseling, explanation of diagnosis, planning of further management, and coordination of care.  MEDICATIONS: Scheduled Meds: . amLODipine  10 mg Oral Daily  . antiseptic oral rinse  7 mL Mouth Rinse BID  . atenolol  25 mg Oral QHS  . cholecalciferol  1,000 Units Oral Daily  . clonazePAM  1 mg Oral Daily  . clonazePAM  2 mg Oral QPM  . enoxaparin (LOVENOX) injection  40 mg Subcutaneous Q24H  . ertapenem  1 g Intravenous Q24H  . escitalopram  20 mg Oral Daily  .  famotidine  20 mg Oral QHS  . levothyroxine  50 mcg Oral QAC breakfast   Continuous Infusions: . sodium chloride 20 mL/hr at 07/30/15 0442   PRN Meds:.acetaminophen, alum & mag hydroxide-simeth, hydrALAZINE, ondansetron, oxyCODONE-acetaminophen **AND** oxyCODONE    PHYSICAL EXAM: Vital signs in last 24 hours: Filed Vitals:   07/29/15 1309 07/29/15 2129 07/30/15 0519 07/30/15 0959  BP: 123/65 125/70 142/67 138/63  Pulse: 62 56 63 51  Temp: 97.6 F (36.4 C) 98.1 F (36.7 C) 98.4 F (36.9 C)   TempSrc:  Oral Oral   Resp: 19 18 18    Height:      Weight:      SpO2: 92% 92% 92% 93%    Weight  change:  Filed Weights   07/24/15 2054 07/25/15 0239  Weight: 108.863 kg (240 lb) 108.3 kg (238 lb 12.1 oz)   Body mass index is 42.3 kg/(m^2).   Gen Exam: Pleasant female, sitting in recliner, NAD  Neck: Supple, No JVD.   Chest: B/L Clear.   CVS: S1 S2 Regular, no murmurs.  Abdomen: soft, BS +, non tender, non distended.  Extremities: no edema, lower extremities warm to touch. Neurologic: Non Focal.   Skin: No Rash.   Wounds: N/A.    Intake/Output from previous day:  Intake/Output Summary (Last 24 hours) at 07/30/15 1335 Last data filed at 07/30/15 1203  Gross per 24 hour  Intake    680 ml  Output    350 ml  Net    330 ml     LAB RESULTS: CBC  Recent Labs Lab 07/24/15 2220 07/24/15 2225 07/25/15 0530 07/26/15 0415 07/27/15 0541  WBC  --  9.7 12.2* 10.4 6.6  HGB 16.7* 15.3* 15.4* 13.7 13.2  HCT 49.0* 47.1* 48.2* 42.6 41.8  PLT  --  130* 134* 116* 122*  MCV  --  97.9 98.4 98.4 98.6  MCH  --  31.8 31.4 31.6 31.1  MCHC  --  32.5 32.0 32.2 31.6  RDW  --  14.5 14.8 14.7 14.8  LYMPHSABS  --  0.8  --   --   --   MONOABS  --  0.7  --   --   --   EOSABS  --  0.0  --   --   --   BASOSABS  --  0.0  --   --   --     Chemistries   Recent Labs Lab 07/24/15 2220 07/25/15 0042 07/25/15 0530 07/26/15 0415 07/27/15 0541  NA 140 139 141 139 139  K 3.8 3.9 4.3 4.0 4.2  CL 99* 104 104 105 106  CO2  --  28 28 27 27   GLUCOSE 138* 161* 136* 115* 125*  BUN 14 11 12 11 12   CREATININE 1.00 1.10* 1.17* 0.96 0.99  CALCIUM  --  8.4* 8.7* 8.1* 8.1*    CBG: No results for input(s): GLUCAP in the last 168 hours.  GFR Estimated Creatinine Clearance: 63.3 mL/min (by C-G formula based on Cr of 0.99).   Urine Studies No results for input(s): UHGB, CRYS in the last 72 hours.  Invalid input(s): UACOL, UAPR, USPG, UPH, UTP, UGL, UKET, UBIL, UNIT, UROB, ULEU, UEPI, UWBC, URBC, UBAC, CAST, UCOM, BILUA   MICROBIOLOGY: Recent Results (from the past 240 hour(s))  Urine culture      Status: None   Collection Time: 07/24/15 10:41 PM  Result Value Ref Range Status   Specimen Description URINE, CLEAN CATCH  Final   Special Requests  NONE  Final   Culture   Final    >=100,000 COLONIES/mL ESCHERICHIA COLI Confirmed Extended Spectrum Beta-Lactamase Producer (ESBL)    Report Status 07/27/2015 FINAL  Final   Organism ID, Bacteria ESCHERICHIA COLI  Final      Susceptibility   Escherichia coli - MIC*    AMPICILLIN >=32 RESISTANT Resistant     CEFAZOLIN >=64 RESISTANT Resistant     CEFTRIAXONE >=64 RESISTANT Resistant     CIPROFLOXACIN <=0.25 SENSITIVE Sensitive     GENTAMICIN <=1 SENSITIVE Sensitive     IMIPENEM <=0.25 SENSITIVE Sensitive     NITROFURANTOIN <=16 SENSITIVE Sensitive     TRIMETH/SULFA <=20 SENSITIVE Sensitive     AMPICILLIN/SULBACTAM >=32 RESISTANT Resistant     PIP/TAZO <=4 SENSITIVE Sensitive     * >=100,000 COLONIES/mL ESCHERICHIA COLI  Culture, blood (routine x 2)     Status: None   Collection Time: 07/25/15 10:35 AM  Result Value Ref Range Status   Specimen Description BLOOD RIGHT ANTECUBITAL  Final   Special Requests BOTTLES DRAWN AEROBIC ONLY 5CC  Final   Culture  Setup Time   Final    GRAM NEGATIVE RODS AEROBIC BOTTLE ONLY CRITICAL RESULT CALLED TO, READ BACK BY AND VERIFIED WITH: V ROBERTSON @0245  07/26/15 MKELLY    Culture   Final    ESCHERICHIA COLI Confirmed Extended Spectrum Beta-Lactamase Producer (ESBL)    Report Status 07/29/2015 FINAL  Final   Organism ID, Bacteria ESCHERICHIA COLI  Final      Susceptibility   Escherichia coli - MIC*    AMPICILLIN >=32 RESISTANT Resistant     CEFAZOLIN >=64 RESISTANT Resistant     CEFEPIME 2 RESISTANT Resistant     CEFTAZIDIME <=1 RESISTANT Resistant     CEFTRIAXONE >=64 RESISTANT Resistant     CIPROFLOXACIN <=0.25 SENSITIVE Sensitive     GENTAMICIN <=1 SENSITIVE Sensitive     IMIPENEM <=0.25 SENSITIVE Sensitive     TRIMETH/SULFA <=20 SENSITIVE Sensitive     AMPICILLIN/SULBACTAM >=32  RESISTANT Resistant     PIP/TAZO <=4 SENSITIVE Sensitive     * ESCHERICHIA COLI  Culture, blood (routine x 2)     Status: None (Preliminary result)   Collection Time: 07/25/15 10:40 AM  Result Value Ref Range Status   Specimen Description BLOOD RIGHT HAND  Final   Special Requests IN PEDIATRIC BOTTLE 2CC  Final   Culture NO GROWTH 4 DAYS  Final   Report Status PENDING  Incomplete    RADIOLOGY STUDIES/RESULTS: Dg Chest 2 View  07/24/2015  CLINICAL DATA:  Fever.  Urinary tract symptoms. EXAM: CHEST  2 VIEW COMPARISON:  03/20/2015 FINDINGS: There is moderate cardiomegaly and aortic tortuosity. The lungs are clear. There is no pleural effusion. Hilar and mediastinal contours are unremarkable and unchanged. IMPRESSION: Cardiomegaly.  No acute findings. Electronically Signed   By: Andreas Newport M.D.   On: 07/24/2015 21:42    Oren Binet, PA-S [facility]  07/30/2015, 1:35 PM   LOS: 5 days

## 2015-07-30 NOTE — Social Work (Signed)
No LOG beds available for 07/30/15.  Christene Lye MSW, [place]

## 2015-07-31 MED ORDER — CLONAZEPAM 1 MG PO TABS
2.0000 mg | ORAL_TABLET | Freq: Every evening | ORAL | Status: DC
  Administered 2015-07-31: 2 mg via ORAL

## 2015-07-31 NOTE — Care Management Important Message (Signed)
Important Message  Patient Details  Name: [name] MRN: [id number] Date of Birth: Jun 20, 1946   Medicare Important Message Given:  Yes    Zenon Mayo, RN 07/31/2015, 7:09 [place] Message  Patient Details  Name: [name] MRN: [id number] Date of Birth: [birth year]   Medicare Important Message Given:  Yes    Zenon Mayo, RN 07/31/2015, 7:09 PM

## 2015-07-31 NOTE — Care Management (Addendum)
ED CM received call from Unit CM requesting that this CM  discuss discharge planning with patient's son. Awaiting insurance auth for SNF placement, or HH.  CM arrived to room at 6:45p to speak with patient and son, son not present, patient stated, son recently left and will not be returning tonight. Discussed with possibility of discharge home [facility] services, as per discussion with Daytime Unit CM, Patient then stated, she lives at home alone, and at son's home she would be alone until son and his wife return from work between 7-8 pm. Patient is incontinent of B/B as per patient/staff, and has a fractured left arm and a PICC in the Right arm and will be receiving IV antibiotics until 12/15 as per notes. While discussing plan of care patient fell asleep in the middle of the discussion,   Patient is incontinent of B/B as per Aldean Ast. Patient states, "I don't think I am ready to go home yet."   CM explained that Unit CM and CSW will follow up with securing a safe discharge plan in the am.

## 2015-07-31 NOTE — Progress Notes (Signed)
Physical Therapy Treatment Patient Details Name: DETTA MALCOM MRN: [id number] DOB: 09/20/1945 Today's Date: 07/31/2015    History of Present Illness Patient is a 69 y/o female who presents with dysuria, decreased appetite over past several days and chills. Went to Urgent care who transferred her to ED with "dehydration syndrome". UA positive for UTI. PMH includes ORIF left wrist ~2 weeks ago, tremors, breast ca, seizures, stroke.    PT Comments    Patient is feeling much better today and is progressing with ambulation. No seizure like activity noted. No complaints of lightheadedness or dizziness with activity. Continue to recommend SNF for ongoing Physical Therapy.     Follow Up Recommendations  SNF     Equipment Recommendations  None recommended by PT    Recommendations for Other Services       Precautions / Restrictions Precautions Precautions: Fall Restrictions Weight Bearing Restrictions: Yes LUE Weight Bearing: Non weight bearing Other Position/Activity Restrictions: WB status from prior admission    Mobility  Bed Mobility               General bed mobility comments: Patient up in recliner before and after session  Transfers Overall transfer level: Needs assistance Equipment used: 4-wheeled walker Transfers: Sit to/from Stand Sit to Stand: Min guard         General transfer comment: Assist for balance and safety  Ambulation/Gait Ambulation/Gait assistance: Min guard Ambulation Distance (Feet): 180 Feet Assistive device: 4-wheeled walker   Gait velocity: decreased Gait velocity interpretation: Below normal speed for age/gender General Gait Details: Mildly unsteady gait with cues for safe use of RW. Increased cues for turning with use of RW. Tends to keep feet outside of RW with turn   Stairs            Wheelchair Mobility    Modified Rankin (Stroke Patients Only)       Balance                                     Cognition Arousal/Alertness: Awake/alert Behavior During Therapy: WFL for tasks assessed/performed Overall Cognitive Status: No family/caregiver present to determine baseline cognitive functioning                      Exercises      General Comments        Pertinent Vitals/Pain Pain Assessment: No/denies pain    Home Living                      Prior Function            PT Goals (current goals can now be found in the care plan section) Progress towards PT goals: Progressing toward goals    Frequency  Min 2X/week    PT Plan Current plan remains appropriate    Co-evaluation             End of Session Equipment Utilized During Treatment: Gait belt Activity Tolerance: Patient tolerated treatment well Patient left: in chair;with call bell/phone within reach;with chair alarm set     Time: 0952-1010 PT Time Calculation (min) (ACUTE ONLY): 18 min  Charges:  $Gait Training: 8-22 mins                    G Codes:      Jacqualyn Posey 07/31/2015, 10:13 AM  07/31/2015 [place], Tonia Brooms  PTA

## 2015-07-31 NOTE — Care Management Note (Addendum)
Case Management Note  Patient Details  Name: [name] MRN: [id number] Date of Birth: August 01, 1946  Subjective/Objective:   NCM spoke with patient to let her know that we have not gotten auth from her insurance co yet and if there is anyone that she could go home with to assist her with getting her iv invanz til 12/15.  Patient states she did not have anyone, her son Mortimer Fries and his wife work and they do not get home until later in the evening.  The other grandchldren that live in the house also works.  Patient called her son Mortimer Fries on the  Phone while NCM was in the room, Mortimer Fries states that there will be no one there until around 7 or 8 pm at night.  If they are able to assist her at that time the pharmacist states that it will be ok for patient to take invanz at 7 or 8 pm.  Patient states if she goes home she would like [facility] , Pam with [facility] was notified and she will follow up with this patient tomorrow.  Patient has a left broken arm  With cast on and the picc line in the other arm.  Patient did walk 180 feet with pt today.  If we do not get auth , hopefully patient can go home with her son, per RN patient is incontinent. Mortimer Fries will be coming to see patient later tonight , NCM asked ER NCM to see if she could speak with patient 's sone when he gets there.  NCM will try to speak with family when they arrive to make sure they will be prepared for patient's dc tomorrow.              Action/Plan:   Expected Discharge Date:                  Expected Discharge Plan:  Skilled Nursing Facility  In-House Referral:  Clinical Social Work  Discharge planning Services  CM Consult  Post Acute Care Choice:    Choice offered to:     DME Arranged:    DME Agency:     HH Arranged:    [place] Agency:     Status of Service:  Completed, signed off  Medicare Important Message Given:  Yes Date Medicare IM Given:    Medicare IM give by:    Date Additional Medicare IM Given:    Additional Medicare Important Message  give by:     If discussed at [place] of Stay Meetings, dates discussed:    Additional Comments:  Zenon Mayo, RN 07/31/2015, 6:08 PM

## 2015-08-01 DIAGNOSIS — E039 Hypothyroidism, unspecified: Secondary | ICD-10-CM

## 2015-08-01 MED ORDER — HEPARIN SOD (PORK) LOCK FLUSH 100 UNIT/ML IV SOLN
250.0000 [IU] | INTRAVENOUS | Status: AC | PRN
Start: 2015-08-01 — End: 2015-08-01
  Administered 2015-08-01: 250 [IU]

## 2015-08-01 NOTE — Progress Notes (Signed)
Report called to Pascal LPN at [facility]. No questions or concerns voiced. Nurse tech got pt dressed and belongings packed. IV team came and saline lock pt's PICC line. Pt left unit on stretcher accompanied by 2X medic.

## 2015-08-01 NOTE — Progress Notes (Signed)
Patient will DC to: [facility] Anticipated DC date: 08/01/15 Family notified: Left message for dtr, [name] Transport by: Corey Harold  CSW signing off.  Cedric Fishman, [place] Social Worker [phone]

## 2015-08-01 NOTE — Progress Notes (Signed)
Patient seen and examined. Vitals stable. PICC Line in place. Stable for discharge to SNF. Discharge Summary done a few days back still valid.

## 2015-08-01 NOTE — Care Management Note (Addendum)
Case Management Note  Patient Details  Name: HOPELYN PINAULT MRN: [id number] Date of Birth: 07-09-1946  Subjective/Objective:   Patient is for dc today to snf, CSW  Has gotten bed for patient.  As patient discussed with physician advisor by CSW and NCM plan is for patient to go to snf. CSW received auth from [employer] today and spoke with patient and family regarding placement facility.   Action/Plan:   Expected Discharge Date:                  Expected Discharge Plan:  Skilled Nursing Facility  In-House Referral:  Clinical Social Work  Discharge planning Services  CM Consult  Post Acute Care Choice:    Choice offered to:     DME Arranged:    DME Agency:     HH Arranged:    [place] Agency:     Status of Service:  Completed, signed off  Medicare Important Message Given:  Yes Date Medicare IM Given:    Medicare IM give by:    Date Additional Medicare IM Given:    Additional Medicare Important Message give by:     If discussed at [place] of Stay Meetings, dates discussed:    Additional Comments:  Zenon Mayo, RN 08/01/2015, 11:30 AM

## 2015-08-01 NOTE — Clinical Social Work Placement (Signed)
   CLINICAL SOCIAL WORK PLACEMENT  NOTE  Date:  08/01/2015  Patient Details  Name: [name] MRN: [id number] Date of Birth: [birth year]  Clinical Social Work is seeking post-discharge placement for this patient at the [place] level of care (*CSW will initial, date and re-position this form in  chart as items are completed):  Yes   Patient/family provided with [place] Work Department's list of facilities offering this level of care within the geographic area requested by the patient (or if unable, by the patient's family).  Yes   Patient/family informed of their freedom to choose among providers that offer the needed level of care, that participate in Medicare, Medicaid or managed care program needed by the patient, have an available bed and are willing to accept the patient.  Yes   Patient/family informed of [facility] ownership interest in [facility] and [facility], as well as of the fact that they are under no obligation to receive care at these facilities.  PASRR submitted to EDS on 07/28/15     PASRR number received on 07/28/15     Existing PASRR number confirmed on       FL2 transmitted to all facilities in geographic area requested by pt/family on 07/28/15     FL2 transmitted to all facilities within larger geographic area on       Patient informed that his/her managed care company has contracts with or will negotiate with certain facilities, including the following:        Yes   Patient/family informed of bed offers received.  Patient chooses bed at [facility], [facility]     Physician recommends and patient chooses bed at      Patient to be transferred to [facility] on 08/01/15.  Patient to be transferred to facility by [facility]     Patient family notified on 08/01/15 of transfer.  Name of family member notified:  [name], Daughter     PHYSICIAN       Additional Comment:     _______________________________________________ Benard Halsted, Liborio Negron Torres 08/01/2015, 2:59 PM

## 2015-08-03 LAB — CULTURE, BLOOD (ROUTINE X 2)
CULTURE: NO GROWTH
Culture: NO GROWTH

## 2015-08-06 DIAGNOSIS — R32 Unspecified urinary incontinence: Secondary | ICD-10-CM | POA: Insufficient documentation

## 2015-11-15 NOTE — Addendum Note (Signed)
Addendum  created 11/15/15 1319 by Scheryl Darter, CRNA   Modules edited: Notes Section   Notes Section:  File: [id number]

## 2016-01-08 ENCOUNTER — Other Ambulatory Visit: Payer: Self-pay | Admitting: Family Medicine

## 2016-01-08 DIAGNOSIS — Z853 Personal history of malignant neoplasm of breast: Secondary | ICD-10-CM

## 2016-01-29 ENCOUNTER — Ambulatory Visit (INDEPENDENT_AMBULATORY_CARE_PROVIDER_SITE_OTHER): Payer: Medicare Other | Admitting: Neurology

## 2016-01-29 ENCOUNTER — Encounter: Payer: Self-pay | Admitting: Neurology

## 2016-01-29 VITALS — BP 129/83 | HR 58 | Ht 63.0 in | Wt 241.8 lb

## 2016-01-29 DIAGNOSIS — R251 Tremor, unspecified: Secondary | ICD-10-CM | POA: Diagnosis not present

## 2016-01-29 DIAGNOSIS — F329 Major depressive disorder, single episode, unspecified: Secondary | ICD-10-CM

## 2016-01-29 DIAGNOSIS — F32A Depression, unspecified: Secondary | ICD-10-CM

## 2016-01-29 NOTE — Progress Notes (Signed)
PATIENT: [name] DOB: 1945-12-02  Chief Complaint  Patient presents with  . Tremors    She is here to discuss her full body shaking that has been present for years.  Feels stress makes her symptoms worse.  Says she has had a normal EEG in the past and was diagnosed with pseudoseizures.     HISTORICAL  [name] is 70 yo right handed female, seen in refer by her primary care Cammie Fulp, for evaluation of tremor in June fifth 2017  I reviewed and summarized referring note, she had a history of left breast cancer, status post left lobectomy in 2014, followed by radiation therapy, also had a past history of lumbar surgery in 2005, right hip replacement, cervical decompression surgery in 2005, she has history of chronic urinary and bowel incontinence for many years, She also suffered depression,  She has been treated as SSRI for many years, used to take Celexa, at the beginning of 2017, she was changed to Lexapro, which has helped her symptoms, she also carried a diagnosis of pseudoseizure  She was noted to have tremor since 2007, she noted bilateral hands fairly symmetric tremor, getting worse with stress, she also has intermittent mild slurred speech, stuttering, at its worst during stressful living situation, she felt that whole body was tremoring, lasting for 5-10 minutes, never had loss of consciousness, she also feel chronic neck and low back pain, she denies significant gait difficulty  Her symptoms seems has improved since May 2017, she thinks she has been healed by prayer Have reviewed laboratory evaluation, elevated A1c 6.0, normal TSH 1.9, creatinine 0.97  REVIEW OF SYSTEMS: Full 14 system review of systems performed and notable only for Weight gain, swelling in legs, hearing loss, ringing ears, blurry vision, diarrhea, urination problems, incontinence, joint pain, cramps, achy muscles, allergy, runny nose, confusion, headaches, slurred speech, dizziness, seizure, tremor,  snoring, decreased energy  ALLERGIES: Allergies  Allergen Reactions  . Codeine Nausea And Vomiting    HOME MEDICATIONS: Current Outpatient Prescriptions  Medication Sig Dispense Refill  . amLODipine (NORVASC) 5 MG tablet Take 10 mg by mouth daily.     Marland Kitchen atenolol (TENORMIN) 25 MG tablet Take 25 mg by mouth at bedtime.     . cholecalciferol (VITAMIN D) 1000 UNITS tablet Take 1,000 Units by mouth daily.    . clonazePAM (KLONOPIN) 1 MG tablet Takes 0.5mg  BID and 1.5-2 tablets at bedtime.    Marland Kitchen escitalopram (LEXAPRO) 20 MG tablet Take 20 mg by mouth daily.    Marland Kitchen levothyroxine (SYNTHROID, LEVOTHROID) 50 MCG tablet Take 50 mcg by mouth daily before breakfast.     . Loperamide HCl (IMODIUM PO) Take 1 tablet by mouth daily as needed (diarrhea).    . montelukast (SINGULAIR) 10 MG tablet Take 10 mg by mouth at bedtime.    . naproxen sodium (ANAPROX) 220 MG tablet Take 220 mg by mouth 2 (two) times daily as needed (pain).    . ondansetron (ZOFRAN) 4 MG tablet Take 1 tablet (4 mg total) by mouth every 8 (eight) hours as needed for nausea or vomiting. 20 tablet 0  . ranitidine (ZANTAC) 150 MG capsule Take 150 mg by mouth every evening.    . vitamin C (ASCORBIC ACID) 500 MG tablet Take 1 tablet (500 mg total) by mouth daily. 50 tablet 0   No current facility-administered medications for this visit.    PAST MEDICAL HISTORY: Past Medical History  Diagnosis Date  . Tremor   .  Bowel incontinence   . Bladder incontinence   . High blood pressure   . Anemia   . Depression   . Arthritis   . Cancer [facility])     breast cancer  . GERD (gastroesophageal reflux disease)   . Full dentures   . HOH (hard of hearing)   . Complication of anesthesia     hard to wake up  . Anxiety   . Chronic diarrhea   . Breast cancer ([place]) 08/16/13    left, 1 o'clock  . History of radiation therapy 11/02/13- 12/24/13    left breast 4680 cGy in 26 sessions, left breast boost 1400 cGy in 7 sessions  . Stroke ([place]) 1980     legs weak  . Sleep apnea     has a cpap-5 hr/ not used in years  . Pneumonia     hx  . Hypothyroidism   . Urinary incontinence   . Shortness of breath     when walks  . Pseudoseizures     PAST SURGICAL HISTORY: Past Surgical History  Procedure Laterality Date  . Total hip arthroplasty  2005    rt total hip  . Abdominal hysterectomy    . Ovarian cysts    . Back surgery  2005    lumbar fusion  . Colonoscopy    . Multiple tooth extractions    . Breast lumpectomy with needle localization and axillary sentinel lymph node bx Left 09/29/2013    Procedure: BREAST LUMPECTOMY WITH NEEDLE LOCALIZATION AND AXILLARY SENTINEL LYMPH NODE BX;  Surgeon: [name], MD;  Location: [facility];  Service: General;  Laterality: Left;  clean wound class  . Orif wrist fracture Left 07/08/2015    Procedure: OPEN REDUCTION INTERNAL FIXATION (ORIF) LEFT WRIST FRACTURE AND REPAIR AS INDICATED;  Surgeon: Iran Planas, MD;  Location: [facility];  Service: Orthopedics;  Laterality: Left;    FAMILY HISTORY: Family History  Problem Relation Age of Onset  . Ataxia Neg Hx   . Chorea Neg Hx   . Dementia Neg Hx   . Mental retardation Neg Hx   . Migraines Neg Hx   . Multiple sclerosis Neg Hx   . Neurofibromatosis Neg Hx   . Neuropathy Neg Hx   . Parkinsonism Neg Hx   . Seizures Neg Hx   . Lung cancer Brother   . Stroke Mother     SOCIAL HISTORY:  Social History   Social History  . Marital Status: Widowed    Spouse Name: N/A  . Number of Children: 2  . Years of Education: 10th grade   Occupational History  . Retired    Social History Main Topics  . Smoking status: Never Smoker   . Smokeless tobacco: Never Used  . Alcohol Use: No  . Drug Use: No  . Sexual Activity: Not Currently     Comment: menarche age 78, first live birth age 81, P20,  contraception x 40 years, menopause 75   Other Topics Concern  . Not on file   Social History Narrative   Lives at home alone.    Right-handed.   1 cup caffeine per day.     PHYSICAL EXAM   Filed Vitals:   01/29/16 1350  BP: 129/83  Pulse: 58  Height: 5\' 3"  (1.6 m)  Weight: 241 lb 12 oz (109.657 kg)    Not recorded      Body mass index is 42.83 kg/(m^2).  PHYSICAL EXAMNIATION:  Gen: NAD, conversant, well  nourised, obese, well groomed                     Cardiovascular: Regular rate rhythm, no peripheral edema, warm, nontender. Eyes: Conjunctivae clear without exudates or hemorrhage Neck: Supple, no carotid bruise. Pulmonary: Clear to auscultation bilaterally   NEUROLOGICAL EXAM:  MENTAL STATUS: Speech:    Speech is normal; fluent and spontaneous with normal comprehension.  Cognition:     Orientation to time, place and person     Normal recent and remote memory     Normal Attention span and concentration     Normal Language, naming, repeating,spontaneous speech     Fund of knowledge   CRANIAL NERVES: CN II: Visual fields are full to confrontation. Fundoscopic exam is normal with sharp discs and no vascular changes. Pupils are round equal and briskly reactive to light. CN III, IV, VI: extraocular movement are normal. No ptosis. CN V: Facial sensation is intact to pinprick in all 3 divisions bilaterally. Corneal responses are intact.  CN VII: Face is symmetric with normal eye closure and smile. CN VIII: Hearing is normal to rubbing fingers CN IX, X: Palate elevates symmetrically. Phonation is normal. CN XI: Head turning and shoulder shrug are intact CN XII: Tongue is midline with normal movements and no atrophy.  MOTOR: There is no pronator drift of out-stretched arms. Muscle bulk and tone are normal. Muscle strength is normal.  REFLEXES: Reflexes are 2+ and symmetric at the biceps, triceps, knees, and ankles. Plantar responses are flexor.  SENSORY: Intact to light touch, pinprick, positional sensation and vibratory sensation are intact in fingers and toes.  COORDINATION: Rapid  alternating movements and fine finger movements are intact. There is no dysmetria on finger-to-nose and heel-knee-shin.    GAIT/STANCE: Posture is normal. Gait is steady with normal steps, base, arm swing, and turning. Heel and toe walking are normal. Tandem gait is normal.  Romberg is absent.   DIAGNOSTIC DATA (LABS, IMAGING, TESTING) - I reviewed patient records, labs, notes, testing and imaging myself where available.   ASSESSMENT AND PLAN  [name] is a 70 y.o. 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 Ph.D.  [facility] [address], [place], [place] [address] Ph: [phone] Fax: [phone]  CC: Antony Blackbird, MD

## 2016-03-01 ENCOUNTER — Encounter: Payer: Self-pay | Admitting: Internal Medicine

## 2016-03-21 ENCOUNTER — Telehealth: Payer: Self-pay

## 2016-03-21 NOTE — Telephone Encounter (Signed)
I left messages on her voice mail 03/14/16 and 03/21/16 to call me back and we will move her appointment up from 05/06/16 to sooner to see Dr Carlean Purl.

## 2016-03-26 NOTE — Telephone Encounter (Signed)
Called back to let us know appointment on 05-06-16 is ok with her. She has to arrange transportation and this date is ok with her.

## 2016-04-11 ENCOUNTER — Ambulatory Visit
Admission: RE | Admit: 2016-04-11 | Discharge: 2016-04-11 | Disposition: A | Discharge status: Home / Self Care | Payer: Medicare Other | Source: Ambulatory Visit | Attending: Family Medicine | Admitting: Family Medicine

## 2016-04-11 DIAGNOSIS — Z853 Personal history of malignant neoplasm of breast: Secondary | ICD-10-CM

## 2016-05-06 ENCOUNTER — Ambulatory Visit: Payer: Self-pay | Admitting: Internal Medicine

## 2016-07-25 ENCOUNTER — Emergency Department (HOSPITAL_COMMUNITY)
Admission: EM | Admit: 2016-07-25 | Discharge: 2016-07-25 | Disposition: A | Discharge status: Home / Self Care | Payer: Medicare Other | Attending: Emergency Medicine | Admitting: Emergency Medicine

## 2016-07-25 ENCOUNTER — Encounter (HOSPITAL_COMMUNITY): Payer: Self-pay | Admitting: Emergency Medicine

## 2016-07-25 DIAGNOSIS — Z8673 Personal history of transient ischemic attack (TIA), and cerebral infarction without residual deficits: Secondary | ICD-10-CM | POA: Insufficient documentation

## 2016-07-25 DIAGNOSIS — R569 Unspecified convulsions: Secondary | ICD-10-CM | POA: Diagnosis present

## 2016-07-25 DIAGNOSIS — E039 Hypothyroidism, unspecified: Secondary | ICD-10-CM | POA: Insufficient documentation

## 2016-07-25 DIAGNOSIS — Z96641 Presence of right artificial hip joint: Secondary | ICD-10-CM | POA: Diagnosis not present

## 2016-07-25 DIAGNOSIS — Z853 Personal history of malignant neoplasm of breast: Secondary | ICD-10-CM | POA: Insufficient documentation

## 2016-07-25 DIAGNOSIS — F445 Conversion disorder with seizures or convulsions: Secondary | ICD-10-CM

## 2016-07-25 NOTE — ED Provider Notes (Signed)
Emergency Department Provider Note   I have reviewed the triage vital signs and the nursing notes.   HISTORY  Chief Complaint Tremors and Seizures   HPI [name] is a 70 y.o. female with PMH of non-epileptiform seizures presents to the emergency room in for evaluation of non-epileptiform seizure. The patient states that she's been taking less Klonopin recently because it makes her very sleepy. She last took Klonopin yesterday evening. She was talking with friends when she began shaking all over and stopping her feet. She reports that this is very typical of her non-epileptiform seizures. She told the people around her that she would be fine and just needed to go back to her room to take her Klonopin but they insisted on calling EMS for transport. The patient states she's been her normal state of health. She has been feeling more anxiety. She has baseline headache and neck discomfort.     Past Medical History:  Diagnosis Date  . Anemia   . Anxiety   . Arthritis   . Bladder incontinence   . Bowel incontinence   . Breast cancer ([place]) 08/16/13   left, 1 o'clock  . Cancer [facility])    breast cancer  . Chronic cystitis   . Chronic diarrhea   . Complication of anesthesia    hard to wake up  . Depression   . Full dentures   . GERD (gastroesophageal reflux disease)   . High blood pressure   . History of radiation therapy 11/02/13- 12/24/13   left breast 4680 cGy in 26 sessions, left breast boost 1400 cGy in 7 sessions  . HOH (hard of hearing)   . Hypothyroidism   . Pneumonia    hx  . Pseudoseizures   . Shortness of breath    when walks  . Sleep apnea    has a cpap-5 hr/ not used in years  . Stroke ([place]) 1980   legs weak  . Tremor   . Urinary incontinence     Patient Active Problem List   Diagnosis Date Noted  . Tremor 01/29/2016  . Bacteremia   . Sepsis secondary to UTI ([place]) 07/25/2015  . Sleep apnea in adult   . UTI (lower urinary tract infection)   . Distal  radius fracture, left 07/08/2015  . MDD (major depressive disorder), recurrent severe, without psychosis ([place]) 03/21/2015  . 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 Grieving 03/08/2014  . Depressive disorder 03/08/2014  . Breast cancer of upper-outer quadrant of left female breast ([place]) 08/18/2013    Past Surgical History:  Procedure Laterality Date  . ABDOMINAL HYSTERECTOMY    . BACK SURGERY  2005   lumbar fusion  . BREAST LUMPECTOMY WITH NEEDLE LOCALIZATION AND AXILLARY SENTINEL LYMPH NODE BX Left 09/29/2013   Procedure: BREAST LUMPECTOMY WITH NEEDLE LOCALIZATION AND AXILLARY SENTINEL LYMPH NODE BX;  Surgeon: [name], MD;  Location: [facility];  Service: General;  Laterality: Left;  clean wound class  . COLONOSCOPY    . MULTIPLE TOOTH EXTRACTIONS    . ORIF WRIST FRACTURE Left 07/08/2015   Procedure: OPEN REDUCTION INTERNAL FIXATION (ORIF) LEFT WRIST FRACTURE AND REPAIR AS INDICATED;  Surgeon: Iran Planas, MD;  Location: [facility];  Service: Orthopedics;  Laterality: Left;  . ovarian cysts    . TOTAL HIP ARTHROPLASTY  2005   rt total hip    Current Outpatient Rx  . Order #: [id number] Class: Historical Med  . Order #: [id number] Class: Historical Med  . Order #: [id number] Class:  Historical Med  . Order #: [id number] Class: Historical Med  . Order #: [id number] Class: Historical Med  . Order #: [id number] Class: Historical Med  . Order #: [id number] Class: Historical Med  . Order #: [id number] Class: Historical Med  . Order #: [id number] Class: Historical Med  . Order #: [id number] Class: Normal  . Order #: [id number] Class: Historical Med  . Order #: [id number] Class: Normal    Allergies Codeine  Family History  Problem Relation Age of Onset  . Lung cancer Brother   . Stroke Mother   . Ataxia Neg Hx   . Chorea Neg Hx   . Dementia Neg Hx   . Mental retardation Neg Hx   . Migraines Neg Hx   . Multiple sclerosis Neg Hx   . Neurofibromatosis Neg Hx   . Neuropathy Neg Hx   . Parkinsonism  Neg Hx   . Seizures Neg Hx     Social History Social History  Substance Use Topics  . Smoking status: Never Smoker  . Smokeless tobacco: Never Used  . Alcohol use No    Review of Systems  Constitutional: No fever/chills Eyes: No visual changes. ENT: No sore throat. Cardiovascular: Denies chest pain. Respiratory: Denies shortness of breath. Gastrointestinal: No abdominal pain.  No nausea, no vomiting.  No diarrhea.  No constipation. Genitourinary: Negative for dysuria. Musculoskeletal: Negative for back pain. Skin: Negative for rash. Neurological: Negative for headaches, focal weakness or numbness. Positive non-epileptic seizure.   10-point ROS otherwise negative.  ____________________________________________   PHYSICAL EXAM:  VITAL SIGNS: ED Triage Vitals  Enc Vitals Group     BP 07/25/16 1538 128/81     Pulse Rate 07/25/16 1538 (!) 56     Resp 07/25/16 1538 16     Temp 07/25/16 1538 97.9 F (36.6 C)     Temp src --      SpO2 07/25/16 1538 92 %     Weight 07/25/16 1542 228 lb (103.4 kg)     Height 07/25/16 1542 5\' 3"  (1.6 m)     Pain Score 07/25/16 1543 7    Constitutional: Alert and oriented. Well appearing and in no acute distress. Eyes: Conjunctivae are normal. PERRL. EOMI. Head: Atraumatic. Nose: No congestion/rhinnorhea. Mouth/Throat: Mucous membranes are moist.  Oropharynx non-erythematous. Neck: No stridor.  Cardiovascular: Normal rate, regular rhythm. Good peripheral circulation. Grossly normal heart sounds.   Respiratory: Normal respiratory effort.  No retractions. Lungs CTAB. Gastrointestinal: Soft and nontender. No distention.  Musculoskeletal: No lower extremity tenderness nor edema. No gross deformities of extremities. Neurologic:  Normal speech and language. No gross focal neurologic deficits are appreciated.  Skin:  Skin is warm, dry and intact. No rash noted. Psychiatric: Mood and affect are normal. Speech and behavior are  normal.  ____________________________________________   PROCEDURES  Procedure(s) performed:   Procedures  None ____________________________________________   INITIAL IMPRESSION / ASSESSMENT AND PLAN / ED COURSE  Pertinent labs & imaging results that were available during my care of the patient were reviewed by me and considered in my medical decision making (see chart for details).  Patient resents emergency department for evaluation of pseudoseizure activity. Patient reports a known history of this diagnosis and that it felt very similar to prior episodes. The patient reports feeling fine and back to her baseline. She did not want transport to the emergency department initially. She has an intact neurological exam. No clear indication for additional lab work or EKG at this time. Plan for discharge home to take Klonopin as needed and follow with  the primary care physician.  At this time, I do not feel there is any life-threatening condition present. I have reviewed and discussed all results (EKG, imaging, lab, urine as appropriate), exam findings with patient. I have reviewed nursing notes and appropriate previous records.  I feel the patient is safe to be discharged home without further emergent workup. Discussed usual and customary return precautions. Patient and family (if present) verbalize understanding and are comfortable with this plan.  Patient will follow-up with their primary care provider. If they do not have a primary care provider, information for follow-up has been provided to them. All questions have been answered.  ____________________________________________  FINAL CLINICAL IMPRESSION(S) / ED DIAGNOSES  Final diagnoses:  Pseudoseizure     MEDICATIONS GIVEN DURING THIS VISIT:  None  NEW OUTPATIENT MEDICATIONS STARTED DURING THIS VISIT:  None   Note:  This document was prepared using Dragon voice recognition software and may include unintentional dictation  errors.  Nanda Quinton, MD Emergency Medicine   Margette Fast, MD 07/26/16 1049

## 2016-07-25 NOTE — Discharge Instructions (Signed)
You were seen in the ED today with non-epileptic seizures. We believe that you are safe to return home and take your klonopin.   Return to the ED with any new breakthrough seizures.

## 2016-07-25 NOTE — ED Triage Notes (Signed)
Patient is having increased anxiety x 1-2 days.  She has a history of pseudo-seizures and has been taking 1/2 doses of her medication for seizures for 3 weeks. She is from home.  BP:145/76 HR:56 R:20 95% on room air  CBG:152

## 2016-07-26 ENCOUNTER — Encounter: Payer: Self-pay | Admitting: Internal Medicine

## 2016-07-26 ENCOUNTER — Ambulatory Visit (INDEPENDENT_AMBULATORY_CARE_PROVIDER_SITE_OTHER): Payer: Medicare Other | Admitting: Internal Medicine

## 2016-07-26 VITALS — BP 112/74 | HR 74 | Ht 63.0 in | Wt 227.0 lb

## 2016-07-26 DIAGNOSIS — R152 Fecal urgency: Secondary | ICD-10-CM | POA: Diagnosis not present

## 2016-07-26 DIAGNOSIS — Z860100 Personal history of colon polyps, unspecified: Secondary | ICD-10-CM

## 2016-07-26 DIAGNOSIS — K582 Mixed irritable bowel syndrome: Secondary | ICD-10-CM

## 2016-07-26 DIAGNOSIS — E739 Lactose intolerance, unspecified: Secondary | ICD-10-CM

## 2016-07-26 DIAGNOSIS — K6289 Other specified diseases of anus and rectum: Secondary | ICD-10-CM

## 2016-07-26 DIAGNOSIS — K641 Second degree hemorrhoids: Secondary | ICD-10-CM

## 2016-07-26 DIAGNOSIS — R159 Full incontinence of feces: Secondary | ICD-10-CM | POA: Diagnosis not present

## 2016-07-26 NOTE — Progress Notes (Signed)
[name] 70 y.o. 03/20/1946 [id number] Referred by: Bjorn Loser, MD  Assessment & Plan:   Encounter Diagnoses  Name Primary?  . Incontinence of feces with fecal urgency Yes  . Anal sphincter incompetence   . Hx of colonic polyps   . Grade II internal hemorrhoids   . Lactose intolerance   . Irritable bowel syndrome with both constipation and diarrhea (most)      ColonoscopyWill be performed given history of polyps.The risks and benefits as well as alternatives of endoscopic procedure(s) have been discussed and reviewed. All questions answered. The patient agrees to proceed.  benefiber 1 tablespoon a day ? Pelvic PT, consider this pending colonoscopy Is already doing peroneal n stimulation for bladder, I'm not aware this will help the rectum I will check with Dr. Matilde Sprang ? Band hemorrhoids Anal sphincter very weak, it may not be possible to recover much function here  I appreciate the opportunity to care for this patient. CC: FULP, CAMMIE, MD Bjorn Loser M.D.  Subjective:   Chief Complaint: Leaking stool, diarrhea  HPI The patient is a nice 70 year old white woman here by herself, at the request of Dr. Matilde Sprang, because of fecal leakage, incontinence and diarrhea symptoms. He evaluated her in July, for urinary leakage symptoms, he had a small cystocele she had recurrent urinary tract infections suspected so he treated with antibiotics, didn't make much difference in her symptoms. She has started treatment with nerve stimulation in the legs to try to reduce urinary incontinence with urgency and overactive bladder symptoms. She wears pads to help with urinary leakage. She will leak stool into her diapers as well. These symptoms have been ongoing for a number of years. She has had extensive back surgery though it's not clear to related to that temporarily. Most of her symptoms of urge related. She had a colonoscopy 10 or more years ago and said she had polyps,  "but they were okay".   She does use Zantac to treat heartburn successfully, she cannot drink milk products or eat them because she'll get diarrhea so she uses Lactaid etc. with success, and she will take loperamide to help with diarrhea symptoms that she has. It does sound like she tends towards diarrhea. However she can get constipated. Believe she has hemorrhoids that she gets anal irritation and itching at times. Allergies  Allergen Reactions  . Codeine Nausea And Vomiting   Outpatient Medications Prior to Visit  Medication Sig Dispense Refill  . amLODipine (NORVASC) 5 MG tablet Take 10 mg by mouth daily.     Marland Kitchen atenolol (TENORMIN) 25 MG tablet Take 25 mg by mouth at bedtime.     . clonazePAM (KLONOPIN) 1 MG tablet Takes 0.5mg  BID and 1.5-2 tablets at bedtime.    Marland Kitchen escitalopram (LEXAPRO) 20 MG tablet Take 20 mg by mouth daily.    Marland Kitchen levothyroxine (SYNTHROID, LEVOTHROID) 50 MCG tablet Take 50 mcg by mouth daily before breakfast.     . montelukast (SINGULAIR) 10 MG tablet Take 10 mg by mouth at bedtime.    . ondansetron (ZOFRAN) 4 MG tablet Take 1 tablet (4 mg total) by mouth every 8 (eight) hours as needed for nausea or vomiting. 20 tablet 0  . ranitidine (ZANTAC) 150 MG capsule Take 150 mg by mouth every evening.    . naproxen sodium (ANAPROX) 220 MG tablet Take 220 mg by mouth 2 (two) times daily as needed (pain).    . cholecalciferol (VITAMIN D) 1000 UNITS tablet Take 1,000 Units by  mouth daily.    . Loperamide HCl (IMODIUM PO) Take 1 tablet by mouth daily as needed (diarrhea).    . vitamin C (ASCORBIC ACID) 500 MG tablet Take 1 tablet (500 mg total) by mouth daily. (Patient not taking: Reported on 70/08/2015) 50 tablet 0   No facility-administered medications prior to visit.    Past Medical History:  Diagnosis Date  . Anemia   . Anxiety   . Arthritis   . Bladder incontinence   . Bowel incontinence   . Breast cancer ([place]) 08/16/13   left, 1 o'clock  . Cancer [facility])    breast  cancer  . Chronic cystitis   . Chronic diarrhea   . Complication of anesthesia    hard to wake up  . Depression   . Full dentures   . GERD (gastroesophageal reflux disease)   . High blood pressure   . History of radiation therapy 11/02/13- 12/24/13   left breast 4680 cGy in 26 sessions, left breast boost 1400 cGy in 7 sessions  . HOH (hard of hearing)   . Hypothyroidism   . Pneumonia    hx  . Pseudoseizures   . Shortness of breath    when walks  . Sleep apnea    has a cpap-5 hr/ not used in years  . Stroke ([place]) 1980   legs weak  . Tremor   . Urinary incontinence    Past Surgical History:  Procedure Laterality Date  . ABDOMINAL HYSTERECTOMY    . BACK SURGERY  2005   lumbar fusion  . BREAST LUMPECTOMY WITH NEEDLE LOCALIZATION AND AXILLARY SENTINEL LYMPH NODE BX Left 09/29/2013   Procedure: BREAST LUMPECTOMY WITH NEEDLE LOCALIZATION AND AXILLARY SENTINEL LYMPH NODE BX;  Surgeon: [name], MD;  Location: [place];  Service: General;  Laterality: Left;  clean wound class  . COLONOSCOPY    . MULTIPLE TOOTH EXTRACTIONS    . ORIF WRIST FRACTURE Left 07/08/2015   Procedure: OPEN REDUCTION INTERNAL FIXATION (ORIF) LEFT WRIST FRACTURE AND REPAIR AS INDICATED;  Surgeon: Iran Planas, MD;  Location: [place];  Service: Orthopedics;  Laterality: Left;  . ovarian cysts    . TOTAL HIP ARTHROPLASTY  2005   rt total hip   Social History   Social History  . Marital status: Widowed    Spouse name: N/A  . Number of children: 2  . Years of education: 10th grade   Occupational History  . Retired    Social History Main Topics  . Smoking status: Never Smoker  . Smokeless tobacco: Never Used  . Alcohol use No  . Drug use: No  . Sexual activity: Not Currently     Comment: menarche age 75, first live birth age 65, P47,  contraception x 40 years, menopause 80   Other Topics Concern  . None   Social History Narrative   Lives at home alone.   Right-handed.   1 cup  caffeine per day.   Family History  Problem Relation Age of Onset  . Lung cancer Brother   . Stroke Mother   . Ataxia Neg Hx   . Chorea Neg Hx   . Dementia Neg Hx   . Mental retardation Neg Hx   . Migraines Neg Hx   . Multiple sclerosis Neg Hx   . Neurofibromatosis Neg Hx   . Neuropathy Neg Hx   . Parkinsonism Neg Hx   . Seizures Neg Hx         Review  of Systems As per history of present illness  back and hip pain. Pseudoseizures, had 1 yesterday Diffuse tremor All other review of systems negative  Objective:   Physical Exam @BP  112/74   Pulse 74   Ht 5\' 3"  (1.6 m)   Wt 227 lb (103 kg)   BMI 40.21 kg/m @  General:  Well-developed, well-nourished and in no acute distress - obese Eyes:  anicteric. ENT:   Mouth and posterior pharynx free of lesions.  Neck:   supple w/o thyromegaly or mass.  Lungs: Clear to auscultation bilaterally. Heart:  S1S2, no rubs, murmurs, gallops. Abdomen:  soft, non-tender, no hepatosplenomegaly, hernia, or mass and BS+.  Rectal:  Female staff present  NL anoderm Absent anal wink Small nodule posteriotr canal - decreased resting and volunray squeeze Appropriate to increased descent w/ simulated defecation  Anoscopy is performed that demonstrated grade 2 internal hemorrhoids in all 3 positions  Extremities:   no edema, cyanosis or clubbing Skin   no rash. Neuro:  A&O x 3. Diffuse tremor Psych:  appropriate mood and  Affect.   Data Reviewed:  Urology notes

## 2016-07-26 NOTE — Patient Instructions (Addendum)
   You have been scheduled for a colonoscopy. Please follow written instructions given to you at your visit today.  Please pick up your prep supplies at the pharmacy. If you use inhalers (even only as needed), please bring them with you on the day of your procedure.  Today we are giving you a benefiber handout to read and follow.  I appreciate the opportunity to care for you. Silvano Rusk, MD, [facility]

## 2016-07-30 ENCOUNTER — Telehealth: Payer: Self-pay | Admitting: *Deleted

## 2016-07-30 ENCOUNTER — Ambulatory Visit: Payer: Medicare Other | Admitting: Neurology

## 2016-07-30 NOTE — Telephone Encounter (Signed)
No showed follow up appointment. 

## 2016-09-09 ENCOUNTER — Telehealth: Payer: Self-pay | Admitting: Internal Medicine

## 2016-09-09 NOTE — Telephone Encounter (Signed)
Spoke with patient states due to not eating she gets shakes very bad but cannot have procedure so late in the day. Patient states she does not really want to have procedure.

## 2016-09-10 ENCOUNTER — Encounter: Payer: Self-pay | Admitting: Internal Medicine

## 2016-09-20 ENCOUNTER — Emergency Department (HOSPITAL_COMMUNITY): Payer: Medicare Other

## 2016-09-20 ENCOUNTER — Emergency Department (HOSPITAL_COMMUNITY)
Admission: EM | Admit: 2016-09-20 | Discharge: 2016-09-20 | Disposition: A | Discharge status: Home / Self Care | Payer: Medicare Other | Attending: Emergency Medicine | Admitting: Emergency Medicine

## 2016-09-20 ENCOUNTER — Encounter (HOSPITAL_COMMUNITY): Payer: Self-pay | Admitting: Emergency Medicine

## 2016-09-20 DIAGNOSIS — Z853 Personal history of malignant neoplasm of breast: Secondary | ICD-10-CM | POA: Diagnosis not present

## 2016-09-20 DIAGNOSIS — Z96641 Presence of right artificial hip joint: Secondary | ICD-10-CM | POA: Diagnosis not present

## 2016-09-20 DIAGNOSIS — E039 Hypothyroidism, unspecified: Secondary | ICD-10-CM | POA: Insufficient documentation

## 2016-09-20 DIAGNOSIS — Z8673 Personal history of transient ischemic attack (TIA), and cerebral infarction without residual deficits: Secondary | ICD-10-CM | POA: Insufficient documentation

## 2016-09-20 DIAGNOSIS — R42 Dizziness and giddiness: Secondary | ICD-10-CM

## 2016-09-20 DIAGNOSIS — Z79899 Other long term (current) drug therapy: Secondary | ICD-10-CM | POA: Insufficient documentation

## 2016-09-20 LAB — BASIC METABOLIC PANEL
Anion gap: 5 (ref 5–15)
BUN: 22 mg/dL — ABNORMAL HIGH (ref 6–20)
CO2: 29 mmol/L (ref 22–32)
Calcium: 8.5 mg/dL — ABNORMAL LOW (ref 8.9–10.3)
Chloride: 106 mmol/L (ref 101–111)
Creatinine, Ser: 1.02 mg/dL — ABNORMAL HIGH (ref 0.44–1.00)
GFR calc Af Amer: >60 mL/min (ref >60)
GFR calc non Af Amer: 54 mL/min — ABNORMAL LOW (ref >60)
Glucose, Bld: 90 mg/dL (ref 65–99)
Potassium: 4.5 mmol/L (ref 3.5–5.1)
Sodium: 140 mmol/L (ref 135–145)

## 2016-09-20 LAB — CBC WITH DIFFERENTIAL/PLATELET
Basophils Absolute: 0 10*3/uL (ref 0.0–0.1)
Basophils Relative: 0 %
Eosinophils Absolute: 0.1 10*3/uL (ref 0.0–0.7)
Eosinophils Relative: 2 %
HCT: 45.9 % (ref 36.0–46.0)
Hemoglobin: 15.5 g/dL — ABNORMAL HIGH (ref 12.0–15.0)
Lymphocytes Relative: 30 %
Lymphs Abs: 1.8 10*3/uL (ref 0.7–4.0)
MCH: 31.4 pg (ref 26.0–34.0)
MCHC: 33.8 g/dL (ref 30.0–36.0)
MCV: 93.1 fL (ref 78.0–100.0)
Monocytes Absolute: 0.4 10*3/uL (ref 0.1–1.0)
Monocytes Relative: 7 %
Neutro Abs: 3.9 10*3/uL (ref 1.7–7.7)
Neutrophils Relative %: 61 %
Platelets: 141 10*3/uL — ABNORMAL LOW (ref 150–400)
RBC: 4.93 MIL/uL (ref 3.87–5.11)
RDW: 13.7 % (ref 11.5–15.5)
WBC: 6.2 10*3/uL (ref 4.0–10.5)

## 2016-09-20 MED ORDER — MECLIZINE HCL 25 MG PO TABS
25.0000 mg | ORAL_TABLET | Freq: Once | ORAL | Status: AC
  Administered 2016-09-20: 25 mg via ORAL
  Filled 2016-09-20: qty 1

## 2016-09-20 MED ORDER — MECLIZINE HCL 25 MG PO TABS: 25.0000 mg | ORAL_TABLET | Freq: Three times a day (TID) | ORAL | 0 refills | Status: DC | PRN

## 2016-09-20 MED ORDER — LORAZEPAM 1 MG PO TABS
1.0000 mg | ORAL_TABLET | Freq: Once | ORAL | Status: AC
Start: 2016-09-20 — End: 2016-09-20
  Administered 2016-09-20: 1 mg via ORAL
  Filled 2016-09-20: qty 1

## 2016-09-20 NOTE — ED Triage Notes (Signed)
Patient c/o dizziness x 2 weeks.  Patient believes she has vertigo, so had it long time ago.  Patient c/o head pain and ear pain for couple weeks. Patient reports putting peroxide in her ears but didn't help much. Patient states that on Monday and Tuesday had diarrhea.

## 2016-09-20 NOTE — ED Notes (Signed)
Pt in CT at present time. Will administer medications with pt return.

## 2016-09-20 NOTE — ED Provider Notes (Signed)
[place] DEPT Provider Note   CSN: [id number] Arrival date & time: 09/20/16  1210   History   Chief Complaint Chief Complaint  Patient presents with  . Dizziness    HPI [name] is a 71 y.o. female.   HPI   71 year old female presents today with complaints of dizziness. Patient reports this is been going on for several weeks. She notes initially it was rare, but notes it's been coming more persistent. She notes symptoms are only present with movements, reporting getting up standing and walking causes significant dizziness. Patient notes she's also had ringing in ears for years, notes that she's had migrating bilateral ear pain lasting seconds at a time. She denies any significant recent trauma, she denies any fevers, nausea or vomiting. Patient reports very minor generalized headache described as pressure. Patient has no neurological deficits.    Past Medical History:  Diagnosis Date  . Anemia   . Anxiety   . Arthritis   . Bladder incontinence   . Bowel incontinence   . Breast cancer ([place]) 08/16/13   left, 1 o'clock  . Cancer [facility])    breast cancer  . Chronic cystitis   . Chronic diarrhea   . Complication of anesthesia    hard to wake up  . Depression   . Full dentures   . GERD (gastroesophageal reflux disease)   . High blood pressure   . History of radiation therapy 11/02/13- 12/24/13   left breast 4680 cGy in 26 sessions, left breast boost 1400 cGy in 7 sessions  . HOH (hard of hearing)   . Hypothyroidism   . Pneumonia    hx  . Pseudoseizures   . Shortness of breath    when walks  . Sleep apnea    has a cpap-5 hr/ not used in years  . Stroke ([place]) 1980   legs weak  . Tremor   . Urinary incontinence     Patient Active Problem List   Diagnosis Date Noted  . Lactose intolerance 07/26/2016  . Tremor 01/29/2016  . Bacteremia   . Sepsis secondary to UTI ([place]) 07/25/2015  . Sleep apnea in adult   . UTI (lower urinary tract infection)   . Distal  radius fracture, left 07/08/2015  . MDD (major depressive disorder), recurrent severe, without psychosis ([place]) 03/21/2015  . 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 Grieving 03/08/2014  . Depressive disorder 03/08/2014  . Breast cancer of upper-outer quadrant of left female breast ([place]) 08/18/2013    Past Surgical History:  Procedure Laterality Date  . ABDOMINAL HYSTERECTOMY    . BACK SURGERY  2005   lumbar fusion  . BREAST LUMPECTOMY WITH NEEDLE LOCALIZATION AND AXILLARY SENTINEL LYMPH NODE BX Left 09/29/2013   Procedure: BREAST LUMPECTOMY WITH NEEDLE LOCALIZATION AND AXILLARY SENTINEL LYMPH NODE BX;  Surgeon: [name], MD;  Location: [place];  Service: General;  Laterality: Left;  clean wound class  . COLONOSCOPY    . MULTIPLE TOOTH EXTRACTIONS    . ORIF WRIST FRACTURE Left 07/08/2015   Procedure: OPEN REDUCTION INTERNAL FIXATION (ORIF) LEFT WRIST FRACTURE AND REPAIR AS INDICATED;  Surgeon: Iran Planas, MD;  Location: [place];  Service: Orthopedics;  Laterality: Left;  . ovarian cysts    . TOTAL HIP ARTHROPLASTY  2005   rt total hip    OB History    No data available       Home Medications    Prior to Admission medications   Medication Sig Start Date  End Date Taking? Authorizing Provider  amLODipine (NORVASC) 5 MG tablet Take 10 mg by mouth daily.  06/01/13  Yes Historical Provider, MD  atenolol (TENORMIN) 25 MG tablet Take 25 mg by mouth at bedtime.    Yes Historical Provider, MD  cholecalciferol (VITAMIN D) 1000 UNITS tablet Take 1,000 Units by mouth daily.   Yes Historical Provider, MD  clonazePAM (KLONOPIN) 1 MG tablet Takes 0.5mg  BID and 1.5-2 tablets at bedtime.   Yes Historical Provider, MD  escitalopram (LEXAPRO) 20 MG tablet Take 20 mg by mouth daily.   Yes Historical Provider, MD  levothyroxine (SYNTHROID, LEVOTHROID) 50 MCG tablet Take 50 mcg by mouth daily before breakfast.  06/28/13  Yes Historical Provider, MD  Loperamide HCl (IMODIUM PO) Take 1 tablet by  mouth daily as needed (diarrhea).   Yes Historical Provider, MD  montelukast (SINGULAIR) 10 MG tablet Take 10 mg by mouth at bedtime.   Yes Historical Provider, MD  naproxen sodium (ANAPROX) 220 MG tablet Take 440 mg by mouth 2 (two) times daily with a meal.   Yes Historical Provider, MD  ranitidine (ZANTAC) 150 MG tablet Take 150 mg by mouth 2 (two) times daily. 07/23/16  Yes Historical Provider, MD  meclizine (ANTIVERT) 25 MG tablet Take 1 tablet (25 mg total) by mouth 3 (three) times daily as needed for dizziness. 09/20/16   Okey Regal, PA-C  vitamin C (ASCORBIC ACID) 500 MG tablet Take 1 tablet (500 mg total) by mouth daily. Patient not taking: Reported on 07/26/2016 07/08/15   Iran Planas, MD    Family History Family History  Problem Relation Age of Onset  . Lung cancer Brother   . Stroke Mother   . Ataxia Neg Hx   . Chorea Neg Hx   . Dementia Neg Hx   . Mental retardation Neg Hx   . Migraines Neg Hx   . Multiple sclerosis Neg Hx   . Neurofibromatosis Neg Hx   . Neuropathy Neg Hx   . Parkinsonism Neg Hx   . Seizures Neg Hx     Social History Social History  Substance Use Topics  . Smoking status: Never Smoker  . Smokeless tobacco: Never Used  . Alcohol use No     Allergies   Codeine   Review of Systems Review of Systems  All other systems reviewed and are negative.  Physical Exam Updated Vital Signs BP 153/93   Pulse (!) 52   Temp 97.6 F (36.4 C)   Resp 19   SpO2 95%   Physical Exam  Constitutional: She is oriented to person, place, and time. She appears well-developed and well-nourished.  HENT:  Head: Normocephalic and atraumatic.  Eyes: Conjunctivae are normal. Pupils are equal, round, and reactive to light. Right eye exhibits no discharge. Left eye exhibits no discharge. No scleral icterus.  Neck: Normal range of motion. No JVD present. No tracheal deviation present.  Pulmonary/Chest: Effort normal. No stridor.  Neurological: She is alert and  oriented to person, place, and time. She has normal strength. No cranial nerve deficit or sensory deficit. Coordination normal. GCS eye subscore is 4. GCS verbal subscore is 5. GCS motor subscore is 6.   no difficulty with ambulation, steady gait  Psychiatric: She has a normal mood and affect. Her behavior is normal. Judgment and thought content normal.  Nursing note and vitals reviewed.   ED Treatments / Results  Labs (all labs ordered are listed, but only abnormal results are displayed) Labs Reviewed  CBC WITH  DIFFERENTIAL/PLATELET - Abnormal; Notable for the following:       Result Value   Hemoglobin 15.5 (*)    Platelets 141 (*)    All other components within normal limits  BASIC METABOLIC PANEL - Abnormal; Notable for the following:    BUN 22 (*)    Creatinine, Ser 1.02 (*)    Calcium 8.5 (*)    GFR calc non Af Amer 54 (*)    All other components within normal limits  URINALYSIS, ROUTINE W REFLEX MICROSCOPIC    EKG  EKG Interpretation None       Radiology Ct Head Wo Contrast  Result Date: 09/20/2016 CLINICAL DATA:  Dizziness for 2 weeks. EXAM: CT HEAD WITHOUT CONTRAST TECHNIQUE: Contiguous axial images were obtained from the base of the skull through the vertex without intravenous contrast. COMPARISON:  03/19/2015 FINDINGS: Brain: There is no evidence for acute hemorrhage, hydrocephalus, mass lesion, or abnormal extra-axial fluid collection. No definite CT evidence for acute infarction. Patchy low attenuation in the deep hemispheric and periventricular white matter is nonspecific, but likely reflects chronic microvascular ischemic demyelination. Vascular: No hyperdense vessel or unexpected calcification. Skull: No evidence for fracture. No worrisome lytic or sclerotic lesion. Sinuses/Orbits: The visualized paranasal sinuses and mastoid air cells are clear. Visualized portions of the globes and intraorbital fat are unremarkable. Other: None. IMPRESSION: 1. Stable exam.  No acute  intracranial abnormality. 2. Chronic small vessel white matter ischemic disease. Electronically Signed   By: Misty Stanley M.D.   On: 09/20/2016 18:39    Procedures Procedures (including critical care time)  Medications Ordered in ED Medications  LORazepam (ATIVAN) tablet 1 mg (1 mg Oral Given 09/20/16 1842)  meclizine (ANTIVERT) tablet 25 mg (25 mg Oral Given 09/20/16 1842)     Initial Impression / Assessment and Plan / ED Course  I have reviewed the triage vital signs and the nursing notes.  Pertinent labs & imaging results that were available during my care of the patient were reviewed by me and considered in my medical decision making (see chart for details).     Final Clinical Impressions(s) / ED Diagnoses   Final diagnoses:  Vertigo    Labs:CBC, BMP  Imaging: CT head without, ED EKG  Consults:  Therapeutics:  Discharge Meds:   Assessment/Plan: Patient presents with vertigo likely secondary to peripheral cause. She has no signs or symptoms consistent with central etiology. Patient did have an episode of pseudoseizure here. She was talking throughout the seizure-like activity. I personally ambulated the patient here in the ED, she has steady gait, no neurological deficits, and reports medication improved her symptoms. She is stable for discharge home, neurology follow-up. Patient given strict return precautions, she verbalized understanding and agreement to today's plan had no further questions or concerns. Patient's pulse noted to be in the low 50s, she isn't instructed to hold her atenolol for the next several days in the event this is contributing to her vertiginous symptoms.       New Prescriptions Discharge Medication List as of 09/20/2016  8:06 PM    START taking these medications   Details  meclizine (ANTIVERT) 25 MG tablet Take 1 tablet (25 mg total) by mouth 3 (three) times daily as needed for dizziness., Starting Fri 09/20/2016, Print         Okey Regal, PA-C 09/20/16 2138    Tanna Furry, MD 10/03/16 1535

## 2016-09-20 NOTE — ED Notes (Addendum)
Unable to complete orthostatic VS related to pt tremor/shaking with attempt to stand. Hedges at bedside and verbalizes pseudo seizures. Pt responds to commands and verbal stimuli with tremor/shaking.

## 2016-09-20 NOTE — Discharge Instructions (Signed)
Please read attached information. If you experience any new or worsening signs or symptoms please return to the emergency room for evaluation. Please follow-up with your primary care provider or specialist as discussed. Please use medication prescribed only as directed and discontinue taking if you have any concerning signs or symptoms.   °

## 2016-10-17 ENCOUNTER — Encounter: Payer: Self-pay | Admitting: Internal Medicine

## 2016-10-17 ENCOUNTER — Ambulatory Visit (INDEPENDENT_AMBULATORY_CARE_PROVIDER_SITE_OTHER): Payer: Medicare Other | Admitting: Internal Medicine

## 2016-10-17 VITALS — BP 126/78 | HR 64 | Ht 62.0 in | Wt 228.1 lb

## 2016-10-17 DIAGNOSIS — Z86010 Personal history of colonic polyps: Secondary | ICD-10-CM

## 2016-10-17 NOTE — Progress Notes (Signed)
   [name] 71 y.o. 10-Sep-1945 [id number]  Assessment & Plan:   Encounter Diagnosis  Name Primary?  Marland Kitchen Hx of colonic polyps Yes   Go ahead w/ colonoscopy - we reviewed her concerns and I think she will do ok with prep. Once colonoscopy completed we can address other issues as per 07/27/2016 note (fecal incontinence, weak anal sphincter, hemorrhoids)   Subjective:   Chief Complaint: concerned about colonoscopy   HPI Here for f/u, had a colonoscopy set up due to hx polyps, was seen 12/1 w/ multiple issues - fecal urgency and incontinence main c/o. She has been fearful of pseudoseizures and consumption of the prep - and cancelled other procedure date. Here asking if there is an alternative.  Medications, allergies, past medical history, past surgical history, family history and social history are reviewed and updated in the EMR.  Review of Systems As above - recent vertigo  Objective:   Physical Exam BP 126/78 (BP Location: Right Arm, Patient Position: Sitting, Cuff Size: Normal)   Pulse 64   Ht 5\' 2"  (1.575 m) Comment: height measured without shoes  Wt 228 lb 2 oz (103.5 kg)   BMI 41.72 kg/m  NAD Obese

## 2016-10-17 NOTE — Patient Instructions (Signed)
You have been scheduled for a colonoscopy. Please follow written instructions given to you at your visit today.  Please pick up your prep supplies at the pharmacy within the next 1-3 days. If you use inhalers (even only as needed), please bring them with you on the day of your procedure. Your physician has requested that you go to www.startemmi.com and enter the access code given to you at your visit today. This web site gives a general overview about your procedure. However, you should still follow specific instructions given to you by our office regarding your preparation for the procedure.  If you are age 64 or older, your body mass index should be between 23-30. Your Body mass index is 41.72 kg/m. If this is out of the aforementioned range listed, please consider follow up with your Primary Care Provider.  If you are age 34 or younger, your body mass index should be between 19-25. Your Body mass index is 41.72 kg/m. If this is out of the aformentioned range listed, please consider follow up with your Primary Care Provider.   I appreciate the opportunity to care for you.  Gatha Mayer, M.D., [facility]

## 2016-11-25 ENCOUNTER — Telehealth: Payer: Self-pay | Admitting: Internal Medicine

## 2016-11-25 NOTE — Telephone Encounter (Signed)
Returned patient's call and she states that she has had diarrhea all morning and haven't started her prep for this evening yet.  She was concerned about this.  When I called, she states that it has slacked up and expressed to me her concerns about her incontinence.  I advised her to still take the prep as instructed and to be close to the bathroom and to wear depends in case she has an accident.  She states that she will.   Also she wanted to know if she could have crackers because she has "seizures" (shakes) really bad when she does not eat.  I told her she could not have crackers but to follow list on her instructions.  Advised her to drink a lot of fluids to keep her hydrated.  Stressed the importance of her not eating or drinking anything 3 hours before her procedure.

## 2016-11-26 ENCOUNTER — Ambulatory Visit (AMBULATORY_SURGERY_CENTER): Payer: Medicare Other | Admitting: Internal Medicine

## 2016-11-26 ENCOUNTER — Encounter: Payer: Self-pay | Admitting: Internal Medicine

## 2016-11-26 VITALS — BP 151/77 | HR 52 | Temp 97.7°F | Resp 12 | Ht 62.0 in | Wt 228.0 lb

## 2016-11-26 DIAGNOSIS — D123 Benign neoplasm of transverse colon: Secondary | ICD-10-CM | POA: Diagnosis not present

## 2016-11-26 DIAGNOSIS — K635 Polyp of colon: Secondary | ICD-10-CM | POA: Diagnosis not present

## 2016-11-26 DIAGNOSIS — Z86010 Personal history of colonic polyps: Secondary | ICD-10-CM

## 2016-11-26 MED ORDER — SODIUM CHLORIDE 0.9 % IV SOLN: 500.0000 mL | INTRAVENOUS | Status: DC

## 2016-11-26 NOTE — Progress Notes (Signed)
Called to room to assist during endoscopic procedure.  Patient ID and intended procedure confirmed with present staff. Received instructions for my participation in the procedure from the performing physician.  

## 2016-11-26 NOTE — Progress Notes (Signed)
Spontaneous respirations throughout. VSS. Resting comfortably. To PACU on room air. Report to  [facility].

## 2016-11-26 NOTE — Op Note (Signed)
[place] Patient Name: Lexani Corona Procedure Date: 11/26/2016 2:23 PM MRN: [id number] Endoscopist: Gatha Mayer , MD Age: 71 Referring MD:  Date of Birth: 06/21/1946 Gender: Female Account #: [id number] Procedure:                Colonoscopy Indications:              High risk colon cancer surveillance: Personal                            history of colonic polyps Medicines:                Propofol per Anesthesia, Monitored Anesthesia Care Procedure:                Pre-Anesthesia Assessment:                           - Prior to the procedure, a History and Physical                            was performed, and patient medications and                            allergies were reviewed. The patient's tolerance of                            previous anesthesia was also reviewed. The risks                            and benefits of the procedure and the sedation                            options and risks were discussed with the patient.                            All questions were answered, and informed consent                            was obtained. Prior Anticoagulants: The patient                            last took previous NSAID medication 4 days prior to                            the procedure. ASA Grade Assessment: III - A                            patient with severe systemic disease. After                            reviewing the risks and benefits, the patient was                            deemed in satisfactory condition to undergo the  procedure.                           After obtaining informed consent, the colonoscope                            was passed under direct vision. Throughout the                            procedure, the patient's blood pressure, pulse, and                            oxygen saturations were monitored continuously. The                            Colonoscope was introduced through the anus and                      advanced to the the cecum, identified by                            appendiceal orifice and ileocecal valve. The                            colonoscopy was performed without difficulty. The                            patient tolerated the procedure well. The quality                            of the bowel preparation was good. The bowel                            preparation used was Miralax. The appendiceal                            orifice and the rectum were photographed. Scope In: 2:34:24 PM Scope Out: 2:44:26 PM Scope Withdrawal Time: 0 hours 8 minutes 1 second  Total Procedure Duration: 0 hours 10 minutes 2 seconds  Findings:                 The perianal examination was normal.                           The digital rectal exam findings include decreased                            sphincter tone.                           A 5 mm polyp was found in the distal transverse                            colon. The polyp was sessile. The polyp was removed  with a cold snare. Resection and retrieval were                            complete. Verification of patient identification                            for the specimen was done. Estimated blood loss was                            minimal.                           Scattered small and large-mouthed diverticula were                            found in the colon.                           Internal hemorrhoids were found during retroflexion.                           The exam was otherwise without abnormality on                            direct and retroflexion views. Complications:            No immediate complications. Estimated Blood Loss:     Estimated blood loss was minimal. Impression:               - Decreased sphincter tone found on digital rectal                            exam.                           - One 5 mm polyp in the distal transverse colon,                            removed  with a cold snare. Resected and retrieved.                           - Diverticulosis.                           - Internal hemorrhoids.                           - The examination was otherwise normal on direct                            and retroflexion views. Recommendation:           - Patient has a contact number available for                            emergencies. The signs and symptoms of potential  delayed complications were discussed with the                            patient. Return to normal activities tomorrow.                            Written discharge instructions were provided to the                            patient.                           - Resume previous diet.                           - Continue present medications.                           - Repeat colonoscopy is recommended for                            surveillance. The colonoscopy date will be                            determined after pathology results from today's                            exam become available for review.                           - Will discuss possible pelvic floor PT with Dr.                            Nicki Reaper Macdiarmid - may also consider hemorrhoid                            bandimng - attempts to improve fecal incontinence Gatha Mayer, MD 11/26/2016 2:56:59 PM This report has been signed electronically.

## 2016-11-26 NOTE — Patient Instructions (Addendum)
   I found and removed one small polyp. It looks benign. I will let you know pathology results and when to have another routine colonoscopy by mail and/or My Chart.  I will speak to Dr. Matilde Sprang about next steps and get back to you.  I appreciate the opportunity to care for you. Gatha Mayer, MD, [facility]  Handout given on Polyp  YOU HAD AN ENDOSCOPIC PROCEDURE TODAY: Refer to the procedure report and other information in the discharge instructions given to you for any specific questions about what was found during the examination. If this information does not answer your questions, please call [place] office at [phone] to clarify.   YOU SHOULD EXPECT: Some feelings of bloating in the abdomen. Passage of more gas than usual. Walking can help get rid of the air that was put into your GI tract during the procedure and reduce the bloating. If you had a lower endoscopy (such as a colonoscopy or flexible sigmoidoscopy) you may notice spotting of blood in your stool or on the toilet paper. Some abdominal soreness may be present for a day or two, also.  DIET: Your first meal following the procedure should be a light meal and then it is ok to progress to your normal diet. A half-sandwich or bowl of soup is an example of a good first meal. Heavy or fried foods are harder to digest and may make you feel nauseous or bloated. Drink plenty of fluids but you should avoid alcoholic beverages for 24 hours. If you had a esophageal dilation, please see attached instructions for diet.    ACTIVITY: Your care partner should take you home directly after the procedure. You should plan to take it easy, moving slowly for the rest of the day. You can resume normal activity the day after the procedure however YOU SHOULD NOT DRIVE, use power tools, machinery or perform tasks that involve climbing or major physical exertion for 24 hours (because of the sedation medicines used during the test).   SYMPTOMS TO REPORT  IMMEDIATELY: A gastroenterologist can be reached at any hour. Please call [phone]  for any of the following symptoms:  Following lower endoscopy (colonoscopy, flexible sigmoidoscopy) Excessive amounts of blood in the stool  Significant tenderness, worsening of abdominal pains  Swelling of the abdomen that is new, acute  Fever of 100 or higher    FOLLOW UP:  If any biopsies were taken you will be contacted by phone or by letter within the next 1-3 weeks. Call [phone]  if you have not heard about the biopsies in 3 weeks.  Please also call with any specific questions about appointments or follow up tests.

## 2016-11-27 ENCOUNTER — Telehealth: Payer: Self-pay

## 2016-11-27 ENCOUNTER — Telehealth: Payer: Self-pay | Admitting: *Deleted

## 2016-11-27 NOTE — Telephone Encounter (Signed)
No answer for second call back left message to call if any questions or concerns. SM

## 2016-11-27 NOTE — Telephone Encounter (Signed)
Name identifier. Left voicemail we will call back later today.

## 2016-12-03 ENCOUNTER — Encounter: Payer: Self-pay | Admitting: Internal Medicine

## 2016-12-03 DIAGNOSIS — Z860100 Personal history of colon polyps, unspecified: Secondary | ICD-10-CM

## 2016-12-03 HISTORY — DX: Personal history of colonic polyps: Z86.010

## 2016-12-03 HISTORY — DX: Personal history of colon polyps, unspecified: Z86.0100

## 2016-12-03 NOTE — Progress Notes (Signed)
Call from office  Polyp benign - possibly precancerous (hyperplastic transverse polyp) - will recall in 5 years to consider colonoscopy  Tell her I talked to Dr. Matilde Sprang and urology will call about setting up pelvic floor PT  Hemorrhoid banding might also help and I think ok to try if she wants though probably give the pelvic PT a chance first and she can then f/u after that is done re: hemorrhoids unless fecal incontinence is fixed totally with PT  [place] no letter

## 2017-10-24 ENCOUNTER — Ambulatory Visit: Payer: Self-pay | Admitting: Cardiology

## 2017-10-27 ENCOUNTER — Ambulatory Visit (INDEPENDENT_AMBULATORY_CARE_PROVIDER_SITE_OTHER): Payer: Medicare Other | Admitting: Cardiology

## 2017-10-27 ENCOUNTER — Encounter: Payer: Self-pay | Admitting: Cardiology

## 2017-10-27 VITALS — BP 132/80 | HR 68 | Ht 62.0 in | Wt 237.8 lb

## 2017-10-27 DIAGNOSIS — R0609 Other forms of dyspnea: Secondary | ICD-10-CM | POA: Insufficient documentation

## 2017-10-27 DIAGNOSIS — E785 Hyperlipidemia, unspecified: Secondary | ICD-10-CM

## 2017-10-27 DIAGNOSIS — R0602 Shortness of breath: Secondary | ICD-10-CM | POA: Diagnosis not present

## 2017-10-27 MED ORDER — NITROGLYCERIN 0.4 MG SL SUBL: 0.4000 mg | SUBLINGUAL_TABLET | SUBLINGUAL | 11 refills | Status: DC | PRN

## 2017-10-27 NOTE — Addendum Note (Signed)
Addended by: Austin Miles on: 10/27/2017 04:29 PM   Modules accepted: Orders

## 2017-10-27 NOTE — Patient Instructions (Signed)
Medication Instructions:  Your physician has recommended you make the following change in your medication: START nitroglycerin sublingual (under tongue) every 5 minutes for chest pain. If you are having chest pain stop what you are doing and sit down, take a nitro, wait 5 minutes, if you are still having chest pain, take another nitro. If you take 3 nitroglycerin tablets under your tongue and you are still having chest pain, call 911.    Labwork: None  Testing/Procedures: Your physician has requested that you have an echocardiogram. Echocardiography is a painless test that uses sound waves to create images of your heart. It provides your doctor with information about the size and shape of your heart and how well your heart's chambers and valves are working. This procedure takes approximately one hour. There are no restrictions for this procedure.  Your physician has requested that you have a lexiscan myoview. For further information please visit [email or web address]. Please follow instruction sheet, as given.    Follow-Up: Your physician recommends that you schedule a follow-up appointment in: 1 month.  Any Other Special Instructions Will Be Listed Below (If Applicable).     If you need a refill on your cardiac medications before your next appointment, please call your pharmacy.

## 2017-10-27 NOTE — Addendum Note (Signed)
Addended by: Austin Miles on: 10/27/2017 04:43 PM   Modules accepted: Orders

## 2017-10-27 NOTE — Progress Notes (Signed)
Cardiology Office Note:    Date:  10/27/2017   ID:  SAKI GASPARYAN, DOB 1946/06/13, MRN [id number]  PCP:  Henrine Screws, MD  Cardiologist:  Garwin Brothers, MD   Referring MD: Henrine Screws, MD    ASSESSMENT:    1. Dyspnea on exertion   2. Dyslipidemia   3. Morbid obesity (HCC)    PLAN:    In order of problems listed above:  1. Primary prevention stressed with the patient.  Importance of compliance with diet and medications stressed and she vocalized understanding.  Her blood pressure is stable.  Lipids are followed by her primary care physician.  I discussed the risks of obesity and weight reduction and she is going to work on this seriously. 2. In view of her symptoms I will obtain a Lexiscan sestamibi.  Echocardiogram will be done to assess murmur heard on auscultation. 3. Sublingual nitroglycerin prescription was sent, its protocol and 911 protocol explained and the patient vocalized understanding questions were answered to the patient's satisfaction 4. Patient will be seen in follow-up appointment in 2 months or earlier if the patient has any concerns    Medication Adjustments/Labs and Tests Ordered: Current medicines are reviewed at length with the patient today.  Concerns regarding medicines are outlined above.  No orders of the defined types were placed in this encounter.  No orders of the defined types were placed in this encounter.    History of Present Illness:    [name] is a 72 y.o. female who is being seen today for the evaluation of shortness of breath on exertion at the request of Henrine Screws, MD.  Patient is a pleasant 72 year old female.  He is a poor historian.  She mentions to me that on shortness of breath is occurring regularly on exertion and she is not sure whether it gets worse.  I asked her about chest tightness with this and she does not know the answer to it.  No radiation of the symptoms to the neck or to the arms.  She is morbidly obese  and ambulates minimally.  No orthopnea PND or any syncope.  At the time of my evaluation, the patient is alert awake oriented and in no distress.  Past Medical History:  Diagnosis Date  . Anemia   . Anxiety   . Arthritis   . Bladder incontinence   . Bowel incontinence   . Breast cancer (HCC) 08/16/13   left, 1 o'clock  . Chronic cystitis   . Chronic diarrhea   . Complication of anesthesia    hard to wake up  . Depression   . Full dentures   . GERD (gastroesophageal reflux disease)   . High blood pressure   . History of radiation therapy 11/02/13- 12/24/13   left breast 4680 cGy in 26 sessions, left breast boost 1400 cGy in 7 sessions  . HOH (hard of hearing)   . Hx of colonic polyps 12/03/2016  . Hypothyroidism   . Pneumonia    hx  . Pseudoseizures   . Shortness of breath    when walks  . Sleep apnea    has a cpap-5 hr/ not used in years  . Stroke (HCC) 1980   legs weak  . Tremor   . Urinary incontinence   . Vertigo     Past Surgical History:  Procedure Laterality Date  . ABDOMINAL HYSTERECTOMY    . BACK SURGERY  2005   lumbar fusion  . BREAST LUMPECTOMY WITH  NEEDLE LOCALIZATION AND AXILLARY SENTINEL LYMPH NODE BX Left 09/29/2013   Procedure: BREAST LUMPECTOMY WITH NEEDLE LOCALIZATION AND AXILLARY SENTINEL LYMPH NODE BX;  Surgeon: Almond Lint, MD;  Location: [facility];  Service: General;  Laterality: Left;  clean wound class  . COLONOSCOPY    . MULTIPLE TOOTH EXTRACTIONS    . ORIF WRIST FRACTURE Left 07/08/2015   Procedure: OPEN REDUCTION INTERNAL FIXATION (ORIF) LEFT WRIST FRACTURE AND REPAIR AS INDICATED;  Surgeon: Bradly Bienenstock, MD;  Location: [facility] OR;  Service: Orthopedics;  Laterality: Left;  . ovarian cysts    . TOTAL HIP ARTHROPLASTY  2005   rt total hip    Current Medications: Current Meds  Medication Sig  . amLODipine (NORVASC) 5 MG tablet Take 10 mg by mouth daily.   Marland Kitchen atenolol (TENORMIN) 25 MG tablet Take 25 mg by mouth at bedtime.   Marland Kitchen  atorvastatin (LIPITOR) 10 MG tablet   . clonazePAM (KLONOPIN) 1 MG tablet Takes 0.5mg  BID and 1.5-2 tablets at bedtime.  Marland Kitchen escitalopram (LEXAPRO) 20 MG tablet Take 20 mg by mouth daily.  . fluticasone (FLONASE) 50 MCG/ACT nasal spray   . levothyroxine (SYNTHROID, LEVOTHROID) 50 MCG tablet Take 50 mcg by mouth daily before breakfast.   . Loperamide HCl (IMODIUM PO) Take 1 tablet by mouth daily as needed (diarrhea).  . meclizine (ANTIVERT) 25 MG tablet Take 1 tablet (25 mg total) by mouth 3 (three) times daily as needed for dizziness.  . montelukast (SINGULAIR) 10 MG tablet Take 10 mg by mouth at bedtime.  . naproxen sodium (ANAPROX) 220 MG tablet Take 440 mg by mouth 2 (two) times daily with a meal.  . ranitidine (ZANTAC) 150 MG tablet Take 150 mg by mouth 2 (two) times daily.  Marland Kitchen sulfamethoxazole-trimethoprim (BACTRIM DS,SEPTRA DS) 800-160 MG tablet   . [DISCONTINUED] cholecalciferol (VITAMIN D) 1000 UNITS tablet Take 1,000 Units by mouth daily.   Current Facility-Administered Medications for the 10/27/17 encounter (Office Visit) with Katheryne Gorr, Aundra Dubin, MD  Medication  . 0.9 %  sodium chloride infusion     Allergies:   Codeine   Social History   Socioeconomic History  . Marital status: Widowed    Spouse name: None  . Number of children: 2  . Years of education: 10th grade  . Highest education level: None  Social Needs  . Financial resource strain: None  . Food insecurity - worry: None  . Food insecurity - inability: None  . Transportation needs - medical: None  . Transportation needs - non-medical: None  Occupational History  . Occupation: Retired  Tobacco Use  . Smoking status: Never Smoker  . Smokeless tobacco: Never Used  Substance and Sexual Activity  . Alcohol use: No  . Drug use: No  . Sexual activity: Not Currently    Comment: menarche age 31, first live birth age 69, P81,  contraception x 40 years, menopause 61  Other Topics Concern  . None  Social History  Narrative   Lives at home alone.   Right-handed.   1 cup caffeine per day.     Family History: The patient's family history includes Diabetes in her brother; Lung cancer in her brother; Stroke in her mother. There is no history of Ataxia, Chorea, Dementia, Mental retardation, Migraines, Multiple sclerosis, Neurofibromatosis, Neuropathy, Parkinsonism, or Seizures.  ROS:   Please see the history of present illness.    All other systems reviewed and are negative.  EKGs/Labs/Other Studies Reviewed:    The following studies  were reviewed today: I discussed my findings with the patient at length.  EKG reveals sinus rhythm with nonspecific ST-T changes.   Recent Labs: No results found for requested labs within last 8760 hours.  Recent Lipid Panel No results found for: CHOL, TRIG, HDL, CHOLHDL, VLDL, LDLCALC, LDLDIRECT  Physical Exam:    VS:  BP 132/80 (BP Location: Left Arm, Patient Position: Sitting, Cuff Size: Normal)   Pulse 68   Ht 5\' 2"  (1.575 m)   Wt 237 lb 12.8 oz (107.9 kg)   SpO2 91%   BMI 43.49 kg/m     Wt Readings from Last 3 Encounters:  10/27/17 237 lb 12.8 oz (107.9 kg)  11/26/16 228 lb (103.4 kg)  10/17/16 228 lb 2 oz (103.5 kg)     GEN: Patient is in no acute distress HEENT: Normal NECK: No JVD; No carotid bruits LYMPHATICS: No lymphadenopathy CARDIAC: S1 S2 regular, 2/6 systolic murmur at the apex. RESPIRATORY:  Clear to auscultation without rales, wheezing or rhonchi  ABDOMEN: Soft, non-tender, non-distended MUSCULOSKELETAL:  No edema; No deformity  SKIN: Warm and dry NEUROLOGIC:  Alert and oriented x 3 PSYCHIATRIC:  Normal affect    Signed, Garwin Brothers, MD  10/27/2017 4:15 PM    [facility]

## 2017-11-05 ENCOUNTER — Telehealth (HOSPITAL_COMMUNITY): Payer: Self-pay | Admitting: *Deleted

## 2017-11-05 NOTE — Telephone Encounter (Signed)
Patient given detailed instructions per Myocardial Perfusion Study Information Sheet for the test on 11/05/17 Patient notified to arrive 15 minutes early and that it is imperative to arrive on time for appointment to keep from having the test rescheduled.  If you need to cancel or reschedule your appointment, please call the office within 24 hours of your appointment. . Patient verbalized understanding. Kirstie Peri

## 2017-11-10 ENCOUNTER — Other Ambulatory Visit: Payer: Self-pay

## 2017-11-10 ENCOUNTER — Ambulatory Visit (HOSPITAL_COMMUNITY): Payer: Medicare Other | Attending: Cardiovascular Disease

## 2017-11-10 ENCOUNTER — Ambulatory Visit (HOSPITAL_BASED_OUTPATIENT_CLINIC_OR_DEPARTMENT_OTHER): Payer: Medicare Other

## 2017-11-10 DIAGNOSIS — R0609 Other forms of dyspnea: Secondary | ICD-10-CM | POA: Diagnosis not present

## 2017-11-10 DIAGNOSIS — R0602 Shortness of breath: Secondary | ICD-10-CM | POA: Insufficient documentation

## 2017-11-10 DIAGNOSIS — I061 Rheumatic aortic insufficiency: Secondary | ICD-10-CM | POA: Insufficient documentation

## 2017-11-10 DIAGNOSIS — I5030 Unspecified diastolic (congestive) heart failure: Secondary | ICD-10-CM | POA: Insufficient documentation

## 2017-11-10 MED ORDER — TECHNETIUM TC 99M TETROFOSMIN IV KIT
33.0000 | PACK | Freq: Once | INTRAVENOUS | Status: AC | PRN
  Administered 2017-11-10: 33 via INTRAVENOUS
  Filled 2017-11-10: qty 33

## 2017-11-10 MED ORDER — ADENOSINE (DIAGNOSTIC) 3 MG/ML IV SOLN
0.5600 mg/kg | Freq: Once | INTRAVENOUS | Status: AC
  Administered 2017-11-10: 60 mg via INTRAVENOUS

## 2017-11-11 ENCOUNTER — Ambulatory Visit (HOSPITAL_COMMUNITY): Payer: Medicare Other | Attending: Cardiology

## 2017-11-11 LAB — MYOCARDIAL PERFUSION IMAGING
CSEPPHR: 81 {beats}/min
LHR: 0.28
LV sys vol: 42 mL
LVDIAVOL: 100 mL (ref 46–106)
Rest HR: 74 {beats}/min
SDS: 2
SRS: 4
SSS: 6
TID: 1.04

## 2017-11-11 MED ORDER — TECHNETIUM TC 99M TETROFOSMIN IV KIT
31.0000 | PACK | Freq: Once | INTRAVENOUS | Status: AC | PRN
  Administered 2017-11-11: 31 via INTRAVENOUS
  Filled 2017-11-11: qty 31

## 2017-11-24 ENCOUNTER — Other Ambulatory Visit: Payer: Self-pay | Admitting: Surgery

## 2017-11-24 DIAGNOSIS — R159 Full incontinence of feces: Secondary | ICD-10-CM

## 2017-11-25 ENCOUNTER — Ambulatory Visit: Payer: Self-pay | Admitting: Cardiology

## 2017-11-28 ENCOUNTER — Inpatient Hospital Stay (HOSPITAL_COMMUNITY)
Admission: EM | Admit: 2017-11-28 | Discharge: 2017-12-04 | DRG: 194 | Disposition: A | Discharge status: Skilled Nursing Facility | Payer: Medicare Other | Attending: Internal Medicine | Admitting: Internal Medicine

## 2017-11-28 ENCOUNTER — Other Ambulatory Visit: Payer: Self-pay

## 2017-11-28 ENCOUNTER — Emergency Department (HOSPITAL_COMMUNITY): Payer: Medicare Other

## 2017-11-28 DIAGNOSIS — F332 Major depressive disorder, recurrent severe without psychotic features: Secondary | ICD-10-CM | POA: Diagnosis present

## 2017-11-28 DIAGNOSIS — Z923 Personal history of irradiation: Secondary | ICD-10-CM

## 2017-11-28 DIAGNOSIS — E785 Hyperlipidemia, unspecified: Secondary | ICD-10-CM | POA: Diagnosis present

## 2017-11-28 DIAGNOSIS — H9190 Unspecified hearing loss, unspecified ear: Secondary | ICD-10-CM | POA: Diagnosis present

## 2017-11-28 DIAGNOSIS — Z79890 Hormone replacement therapy (postmenopausal): Secondary | ICD-10-CM

## 2017-11-28 DIAGNOSIS — F445 Conversion disorder with seizures or convulsions: Secondary | ICD-10-CM | POA: Diagnosis present

## 2017-11-28 DIAGNOSIS — J129 Viral pneumonia, unspecified: Secondary | ICD-10-CM | POA: Diagnosis not present

## 2017-11-28 DIAGNOSIS — E039 Hypothyroidism, unspecified: Secondary | ICD-10-CM | POA: Diagnosis present

## 2017-11-28 DIAGNOSIS — I712 Thoracic aortic aneurysm, without rupture: Secondary | ICD-10-CM | POA: Diagnosis present

## 2017-11-28 DIAGNOSIS — R251 Tremor, unspecified: Secondary | ICD-10-CM | POA: Diagnosis present

## 2017-11-28 DIAGNOSIS — Z972 Presence of dental prosthetic device (complete) (partial): Secondary | ICD-10-CM

## 2017-11-28 DIAGNOSIS — Z885 Allergy status to narcotic agent status: Secondary | ICD-10-CM

## 2017-11-28 DIAGNOSIS — N3020 Other chronic cystitis without hematuria: Secondary | ICD-10-CM | POA: Diagnosis present

## 2017-11-28 DIAGNOSIS — Z853 Personal history of malignant neoplasm of breast: Secondary | ICD-10-CM

## 2017-11-28 DIAGNOSIS — R0902 Hypoxemia: Secondary | ICD-10-CM | POA: Diagnosis present

## 2017-11-28 DIAGNOSIS — Z823 Family history of stroke: Secondary | ICD-10-CM

## 2017-11-28 DIAGNOSIS — Z801 Family history of malignant neoplasm of trachea, bronchus and lung: Secondary | ICD-10-CM

## 2017-11-28 DIAGNOSIS — G4730 Sleep apnea, unspecified: Secondary | ICD-10-CM | POA: Diagnosis present

## 2017-11-28 DIAGNOSIS — Z833 Family history of diabetes mellitus: Secondary | ICD-10-CM

## 2017-11-28 DIAGNOSIS — R062 Wheezing: Secondary | ICD-10-CM | POA: Diagnosis not present

## 2017-11-28 DIAGNOSIS — Z86010 Personal history of colonic polyps: Secondary | ICD-10-CM

## 2017-11-28 DIAGNOSIS — Z90710 Acquired absence of both cervix and uterus: Secondary | ICD-10-CM

## 2017-11-28 DIAGNOSIS — Z79899 Other long term (current) drug therapy: Secondary | ICD-10-CM

## 2017-11-28 DIAGNOSIS — K219 Gastro-esophageal reflux disease without esophagitis: Secondary | ICD-10-CM | POA: Diagnosis present

## 2017-11-28 DIAGNOSIS — G250 Essential tremor: Secondary | ICD-10-CM | POA: Diagnosis present

## 2017-11-28 DIAGNOSIS — Z8673 Personal history of transient ischemic attack (TIA), and cerebral infarction without residual deficits: Secondary | ICD-10-CM

## 2017-11-28 DIAGNOSIS — Z96641 Presence of right artificial hip joint: Secondary | ICD-10-CM | POA: Diagnosis present

## 2017-11-28 DIAGNOSIS — Z981 Arthrodesis status: Secondary | ICD-10-CM

## 2017-11-28 DIAGNOSIS — I119 Hypertensive heart disease without heart failure: Secondary | ICD-10-CM | POA: Diagnosis present

## 2017-11-28 DIAGNOSIS — J189 Pneumonia, unspecified organism: Secondary | ICD-10-CM | POA: Diagnosis present

## 2017-11-28 LAB — CBC WITH DIFFERENTIAL/PLATELET
Basophils Absolute: 0 10*3/uL (ref 0.0–0.1)
Basophils Relative: 0 %
EOS PCT: 2 %
Eosinophils Absolute: 0.2 10*3/uL (ref 0.0–0.7)
HCT: 45.8 % (ref 36.0–46.0)
Hemoglobin: 14.8 g/dL (ref 12.0–15.0)
LYMPHS ABS: 1.3 10*3/uL (ref 0.7–4.0)
LYMPHS PCT: 18 %
MCH: 32.3 pg (ref 26.0–34.0)
MCHC: 32.3 g/dL (ref 30.0–36.0)
MCV: 100 fL (ref 78.0–100.0)
MONO ABS: 0.5 10*3/uL (ref 0.1–1.0)
MONOS PCT: 6 %
Neutro Abs: 5.2 10*3/uL (ref 1.7–7.7)
Neutrophils Relative %: 74 %
PLATELETS: 129 10*3/uL — AB (ref 150–400)
RBC: 4.58 MIL/uL (ref 3.87–5.11)
RDW: 14.5 % (ref 11.5–15.5)
WBC: 7.2 10*3/uL (ref 4.0–10.5)

## 2017-11-28 LAB — COMPREHENSIVE METABOLIC PANEL
ALT: 17 U/L (ref 14–54)
AST: 24 U/L (ref 15–41)
Albumin: 3.5 g/dL (ref 3.5–5.0)
Alkaline Phosphatase: 42 U/L (ref 38–126)
Anion gap: 8 (ref 5–15)
BUN: 20 mg/dL (ref 6–20)
CO2: 25 mmol/L (ref 22–32)
Calcium: 8.3 mg/dL — ABNORMAL LOW (ref 8.9–10.3)
Chloride: 107 mmol/L (ref 101–111)
Creatinine, Ser: 0.85 mg/dL (ref 0.44–1.00)
Glucose, Bld: 107 mg/dL — ABNORMAL HIGH (ref 65–99)
POTASSIUM: 4.1 mmol/L (ref 3.5–5.1)
Sodium: 140 mmol/L (ref 135–145)
Total Bilirubin: 0.9 mg/dL (ref 0.3–1.2)
Total Protein: 6.6 g/dL (ref 6.5–8.1)

## 2017-11-28 LAB — I-STAT CG4 LACTIC ACID, ED: Lactic Acid, Venous: 0.61 mmol/L (ref 0.5–1.9)

## 2017-11-28 LAB — BRAIN NATRIURETIC PEPTIDE: B NATRIURETIC PEPTIDE 5: 24.9 pg/mL (ref 0.0–100.0)

## 2017-11-28 LAB — TROPONIN I: TROPONIN I: 0.04 ng/mL — AB (ref <0.03)

## 2017-11-28 MED ORDER — LORAZEPAM 2 MG/ML IJ SOLN
1.0000 mg | Freq: Once | INTRAMUSCULAR | Status: AC
  Administered 2017-11-28: 1 mg via INTRAVENOUS
  Filled 2017-11-28: qty 1

## 2017-11-28 MED ORDER — IPRATROPIUM-ALBUTEROL 0.5-2.5 (3) MG/3ML IN SOLN
3.0000 mL | Freq: Once | RESPIRATORY_TRACT | Status: AC
Start: 2017-11-28 — End: 2017-11-28
  Administered 2017-11-28: 3 mL via RESPIRATORY_TRACT
  Filled 2017-11-28: qty 3

## 2017-11-28 MED ORDER — IPRATROPIUM-ALBUTEROL 0.5-2.5 (3) MG/3ML IN SOLN
3.0000 mL | Freq: Once | RESPIRATORY_TRACT | Status: AC
  Administered 2017-11-29: 3 mL via RESPIRATORY_TRACT
  Filled 2017-11-28: qty 3

## 2017-11-28 MED ORDER — SODIUM CHLORIDE 0.9 % IV SOLN
500.0000 mg | Freq: Once | INTRAVENOUS | Status: AC
  Administered 2017-11-28: 500 mg via INTRAVENOUS
  Filled 2017-11-28: qty 500

## 2017-11-28 MED ORDER — SODIUM CHLORIDE 0.9 % IV BOLUS
1000.0000 mL | Freq: Once | INTRAVENOUS | Status: AC
  Administered 2017-11-28: 1000 mL via INTRAVENOUS

## 2017-11-28 MED ORDER — SODIUM CHLORIDE 0.9 % IV SOLN
2.0000 g | Freq: Once | INTRAVENOUS | Status: AC
  Administered 2017-11-28: 2 g via INTRAVENOUS
  Filled 2017-11-28: qty 20

## 2017-11-28 NOTE — ED Triage Notes (Signed)
SHOB, cough x3 weeks. Approx 2 weeks ago, it became worse. Productive cough. Yellow sputum in am, then gray. Occasional black spots. No blood noted. CXR at [facility] showed infiltrates in lungs. 89-90% on RA. 94-95% on 4L.

## 2017-11-28 NOTE — ED Notes (Signed)
Bed: TY03 Expected date:  Expected time:  Means of arrival:  Comments: EMS 72 yo female SOB x 2-3 weeks-O2 sat 86-89%/4l/[place] with sat 93-94%

## 2017-11-28 NOTE — ED Provider Notes (Addendum)
[facility] DEPT Provider Note   CSN: [id number] Arrival date & time: 11/28/17  2045     History   Chief Complaint Chief Complaint  Patient presents with  . Shortness of Breath    HPI [name] is a 72 y.o. female.  HPI 72 year old female with past medical history as below here with cough and generalized body aches.  Patient reports that over the last 2 weeks, the patient has had progressively worsening cough, sputum production, cough, and shortness of breath.  She had nausea.  She had poor appetite.  No fevers.  She denies any abdominal pain.  She is had progressive worsening general malaise and weakness.  She had poor appetite.  She said difficulty getting around her house.  No alleviating factors.  She went to her primary care doctor today and was notably hypoxic and had a chest x-ray concerning for pneumonia.  She was subsequent sent here for further evaluation.  No recent hospitalizations or antibiotic use.  Past Medical History:  Diagnosis Date  . Anemia   . Anxiety   . Arthritis   . Bladder incontinence   . Bowel incontinence   . Breast cancer ([facility]) 08/16/13   left, 1 o'clock  . Chronic cystitis   . Chronic diarrhea   . Complication of anesthesia    hard to wake up  . Depression   . Full dentures   . GERD (gastroesophageal reflux disease)   . High blood pressure   . History of radiation therapy 11/02/13- 12/24/13   left breast 4680 cGy in 26 sessions, left breast boost 1400 cGy in 7 sessions  . HOH (hard of hearing)   . Hx of colonic polyps 12/03/2016  . Hypothyroidism   . Pneumonia    hx  . Pseudoseizures   . Shortness of breath    when walks  . Sleep apnea    has a cpap-5 hr/ not used in years  . Stroke ([facility]) 1980   legs weak  . Tremor   . Urinary incontinence   . Vertigo     Patient Active Problem List   Diagnosis Date Noted  . Atypical pneumonia 11/29/2017  . Dyspnea on exertion 10/27/2017  . Dyslipidemia 10/27/2017    . Morbid obesity ([facility]) 10/27/2017  . Hx of colonic polyps 12/03/2016  . Lactose intolerance 07/26/2016  . Tremor 01/29/2016  . Incontinence 08/06/2015  . Bacteremia   . Sepsis secondary to UTI ([facility]) 07/25/2015  . Sleep apnea in adult   . UTI (lower urinary tract infection)   . Distal radius fracture, left 07/08/2015  . MDD (major depressive disorder), recurrent severe, without psychosis ([facility]) 03/21/2015  . 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 Grieving 03/08/2014  . Depressive disorder 03/08/2014  . Breast cancer of upper-outer quadrant of left female breast ([facility]) 08/18/2013    Past Surgical History:  Procedure Laterality Date  . ABDOMINAL HYSTERECTOMY    . BACK SURGERY  2005   lumbar fusion  . BREAST LUMPECTOMY WITH NEEDLE LOCALIZATION AND AXILLARY SENTINEL LYMPH NODE BX Left 09/29/2013   Procedure: BREAST LUMPECTOMY WITH NEEDLE LOCALIZATION AND AXILLARY SENTINEL LYMPH NODE BX;  Surgeon: [name], MD;  Location: [facility];  Service: General;  Laterality: Left;  clean wound class  . COLONOSCOPY    . MULTIPLE TOOTH EXTRACTIONS    . ORIF WRIST FRACTURE Left 07/08/2015   Procedure: OPEN REDUCTION INTERNAL FIXATION (ORIF) LEFT WRIST FRACTURE AND REPAIR AS INDICATED;  Surgeon: Iran Planas, MD;  Location: [facility];  Service: Orthopedics;  Laterality: Left;  . ovarian cysts    . TOTAL HIP ARTHROPLASTY  2005   rt total hip     OB History   None      Home Medications    Prior to Admission medications   Medication Sig Start Date End Date Taking? Authorizing Provider  amLODipine (NORVASC) 5 MG tablet Take 5 mg by mouth 2 (two) times daily.  06/01/13  Yes [provider]  clonazePAM (KLONOPIN) 1 MG tablet Take by mouth. Takes 0.5mg  BID and 1 tablet at bedtime.   Yes [provider]  escitalopram (LEXAPRO) 20 MG tablet Take 20 mg by mouth at bedtime.    Yes [provider]  fluticasone (FLONASE) 50 MCG/ACT nasal spray Place 1 spray into both nostrils  every evening.  10/20/17  Yes [provider]  levothyroxine (SYNTHROID, LEVOTHROID) 50 MCG tablet Take 50 mcg by mouth daily before breakfast.  06/28/13  Yes [provider]  montelukast (SINGULAIR) 10 MG tablet Take 10 mg by mouth at bedtime.   Yes [provider]  ranitidine (ZANTAC) 150 MG tablet Take 150 mg by mouth 2 (two) times daily. 07/23/16  Yes [provider]  atenolol (TENORMIN) 25 MG tablet Take 25 mg by mouth at bedtime.     [provider]  atorvastatin (LIPITOR) 10 MG tablet Take 10 mg by mouth daily.  10/24/17   [provider]  Loperamide HCl (IMODIUM PO) Take 1 tablet by mouth daily as needed (diarrhea).    [provider]  meclizine (ANTIVERT) 25 MG tablet Take 1 tablet (25 mg total) by mouth 3 (three) times daily as needed for dizziness. 09/20/16   Hedges, Dellis Filbert, PA-C  naproxen sodium (ANAPROX) 220 MG tablet Take 440 mg by mouth 2 (two) times daily as needed (pain).     [provider]  nitroGLYCERIN (NITROSTAT) 0.4 MG SL tablet Place 1 tablet (0.4 mg total) under the tongue every 5 (five) minutes as needed for chest pain. 10/27/17 01/25/18  Revankar, Reita Cliche, MD    Family History Family History  Problem Relation Age of Onset  . Lung cancer Brother   . Diabetes Brother   . Stroke Mother   . Ataxia Neg Hx   . Chorea Neg Hx   . Dementia Neg Hx   . Mental retardation Neg Hx   . Migraines Neg Hx   . Multiple sclerosis Neg Hx   . Neurofibromatosis Neg Hx   . Neuropathy Neg Hx   . Parkinsonism Neg Hx   . Seizures Neg Hx     Social History Social History   Tobacco Use  . Smoking status: Never Smoker  . Smokeless tobacco: Never Used  Substance Use Topics  . Alcohol use: No  . Drug use: No     Allergies   Codeine   Review of Systems Review of Systems  Constitutional: Positive for chills and fatigue.  Respiratory: Positive for cough and shortness of breath.   Neurological: Positive for  weakness.  All other systems reviewed and are negative.    Physical Exam Updated Vital Signs BP 128/86   Pulse 77   Temp 98.6 F (37 C) (Oral)   Resp 18   Ht 5\' 3"  (1.6 m)   Wt 107 kg (236 lb)   SpO2 93%   BMI 41.81 kg/m   Physical Exam  Constitutional: She is oriented to person, place, and time. She appears well-developed and well-nourished. No  distress.  HENT:  Head: Normocephalic and atraumatic.  Eyes: Conjunctivae are normal.  Neck: Neck supple.  Cardiovascular: Normal rate, regular rhythm and normal heart sounds. Exam reveals no friction rub.  No murmur heard. Pulmonary/Chest: Effort normal. Tachypnea noted. No respiratory distress. She has wheezes. She has rhonchi. She has no rales.  Abdominal: She exhibits no distension.  Musculoskeletal: She exhibits no edema.  Neurological: She is alert and oriented to person, place, and time. She exhibits normal muscle tone.  Skin: Skin is warm. Capillary refill takes less than 2 seconds.  Psychiatric: She has a normal mood and affect.  Nursing note and vitals reviewed.    ED Treatments / Results  Labs (all labs ordered are listed, but only abnormal results are displayed) Labs Reviewed  CBC WITH DIFFERENTIAL/PLATELET - Abnormal; Notable for the following components:      Result Value   Platelets 129 (*)    All other components within normal limits  COMPREHENSIVE METABOLIC PANEL - Abnormal; Notable for the following components:   Glucose, Bld 107 (*)    Calcium 8.3 (*)    All other components within normal limits  TROPONIN I - Abnormal; Notable for the following components:   Troponin I 0.04 (*)    All other components within normal limits  CULTURE, BLOOD (ROUTINE X 2)  CULTURE, BLOOD (ROUTINE X 2)  CULTURE, EXPECTORATED SPUTUM-ASSESSMENT  GRAM STAIN  BRAIN NATRIURETIC PEPTIDE  HIV ANTIBODY (ROUTINE TESTING)  STREP PNEUMONIAE URINARY ANTIGEN  I-STAT CG4 LACTIC ACID, ED    EKG EKG  Interpretation  Date/Time:  Saturday November 29 2017 00:01:20 EDT Ventricular Rate:  79 PR Interval:    QRS Duration: 95 QT Interval:  396 QTC Calculation: 454 R Axis:   -13 Text Interpretation:  Sinus rhythm LVH with secondary repolarization abnormality Since last EKG, rate has increased Otherwise no significant change Confirmed by Duffy Bruce [phone]) on 11/29/2017 12:09:08 AM   Radiology Dg Chest 2 View  Result Date: 11/29/2017 CLINICAL DATA:  72 y/o F; shortness of breath and cough for 3 weeks. EXAM: CHEST - 2 VIEW COMPARISON:  07/24/2015 chest radiograph FINDINGS: Stable cardiomegaly given projection and technique. Aortic atherosclerosis with calcification. Mild reticular opacities of the lungs. No consolidation. No pleural effusion or pneumothorax. No acute osseous abnormality is evident. Surgical clips project over the left axilla. IMPRESSION: Mild reticular opacities of the lungs may represent interstitial edema or atypical pneumonia. No consolidation. Stable cardiomegaly. Electronically Signed   By: Kristine Garbe M.D.   On: 11/29/2017 00:09    Procedures .Critical Care Performed by: Duffy Bruce, MD Authorized by: Duffy Bruce, MD   Critical care provider statement:    Critical care time (minutes):  35   Critical care time was exclusive of:  Separately billable procedures and treating other patients and teaching time   Critical care was necessary to treat or prevent imminent or life-threatening deterioration of the following conditions:  Respiratory failure, circulatory failure and sepsis   Critical care was time spent personally by me on the following activities:  Development of treatment plan with patient or surrogate, discussions with consultants, evaluation of patient's response to treatment, examination of patient, obtaining history from patient or surrogate, ordering and performing treatments and interventions, ordering and review of laboratory studies,  ordering and review of radiographic studies, pulse oximetry, re-evaluation of patient's condition and review of old charts   I assumed direction of critical care for this patient from another provider in my specialty: no   Comments:  Sepsis, pneumonia, resp distress requiring multiple breathing tx   (including critical care time)  Medications Ordered in ED Medications  enoxaparin (LOVENOX) injection 40 mg (has no administration in time range)  cefTRIAXone (ROCEPHIN) 2 g in sodium chloride 0.9 % 100 mL IVPB (has no administration in time range)  azithromycin (ZITHROMAX) 500 mg in sodium chloride 0.9 % 250 mL IVPB (has no administration in time range)  amLODipine (NORVASC) tablet 5 mg (has no administration in time range)  atenolol (TENORMIN) tablet 25 mg (has no administration in time range)  atorvastatin (LIPITOR) tablet 10 mg (has no administration in time range)  escitalopram (LEXAPRO) tablet 20 mg (has no administration in time range)  clonazePAM (KLONOPIN) tablet 0.5 mg (has no administration in time range)  levothyroxine (SYNTHROID, LEVOTHROID) tablet 50 mcg (has no administration in time range)  fluticasone (FLONASE) 50 MCG/ACT nasal spray 1 spray (has no administration in time range)  montelukast (SINGULAIR) tablet 10 mg (has no administration in time range)  meclizine (ANTIVERT) tablet 25 mg (has no administration in time range)  ipratropium-albuterol (DUONEB) 0.5-2.5 (3) MG/3ML nebulizer solution 3 mL (has no administration in time range)  clonazePAM (KLONOPIN) tablet 1 mg (has no administration in time range)  ipratropium-albuterol (DUONEB) 0.5-2.5 (3) MG/3ML nebulizer solution 3 mL (3 mLs Nebulization Given 11/28/17 2229)  sodium chloride 0.9 % bolus 1,000 mL (0 mLs Intravenous Stopped 11/29/17 0014)  cefTRIAXone (ROCEPHIN) 2 g in sodium chloride 0.9 % 100 mL IVPB (0 g Intravenous Stopped 11/28/17 2301)  azithromycin (ZITHROMAX) 500 mg in sodium chloride 0.9 % 250 mL IVPB (0 mg  Intravenous Stopped 11/29/17 0014)  LORazepam (ATIVAN) injection 1 mg (1 mg Intravenous Given 11/28/17 2321)  ipratropium-albuterol (DUONEB) 0.5-2.5 (3) MG/3ML nebulizer solution 3 mL (3 mLs Nebulization Given 11/29/17 0043)     Initial Impression / Assessment and Plan / ED Course  I have reviewed the triage vital signs and the nursing notes.  Pertinent labs & imaging results that were available during my care of the patient were reviewed by me and considered in my medical decision making (see chart for details).     72 year old female with past medical history as above here with cough and shortness of breath.  Concern for possible pneumonia versus reactive airway disease.  Will start empiric antibiotics and breathing treatments.  Given her hypoxia, will need admission.  No signs of severe sepsis.  Final Clinical Impressions(s) / ED Diagnoses   Final diagnoses:  Atypical pneumonia    ED Discharge Orders    None       Duffy Bruce, MD 11/29/17 7353    Duffy Bruce, MD 12/15/17 1531

## 2017-11-29 ENCOUNTER — Other Ambulatory Visit: Payer: Self-pay

## 2017-11-29 ENCOUNTER — Encounter (HOSPITAL_COMMUNITY): Payer: Self-pay | Admitting: *Deleted

## 2017-11-29 DIAGNOSIS — F332 Major depressive disorder, recurrent severe without psychotic features: Secondary | ICD-10-CM | POA: Diagnosis not present

## 2017-11-29 DIAGNOSIS — R251 Tremor, unspecified: Secondary | ICD-10-CM

## 2017-11-29 DIAGNOSIS — J189 Pneumonia, unspecified organism: Secondary | ICD-10-CM

## 2017-11-29 LAB — HIV ANTIBODY (ROUTINE TESTING W REFLEX): HIV SCREEN 4TH GENERATION: NONREACTIVE

## 2017-11-29 MED ORDER — ENOXAPARIN SODIUM 40 MG/0.4ML ~~LOC~~ SOLN
40.0000 mg | SUBCUTANEOUS | Status: DC
  Administered 2017-11-29 – 2017-12-04 (×6): 40 mg via SUBCUTANEOUS
  Filled 2017-11-29 (×6): qty 0.4

## 2017-11-29 MED ORDER — IPRATROPIUM-ALBUTEROL 0.5-2.5 (3) MG/3ML IN SOLN
3.0000 mL | Freq: Three times a day (TID) | RESPIRATORY_TRACT | Status: DC
  Administered 2017-11-29 – 2017-12-01 (×7): 3 mL via RESPIRATORY_TRACT
  Filled 2017-11-29 (×7): qty 3

## 2017-11-29 MED ORDER — LEVOTHYROXINE SODIUM 50 MCG PO TABS
50.0000 ug | ORAL_TABLET | Freq: Every day | ORAL | Status: DC
  Administered 2017-11-29 – 2017-12-04 (×6): 50 ug via ORAL
  Filled 2017-11-29 (×6): qty 1

## 2017-11-29 MED ORDER — SODIUM CHLORIDE 0.9 % IV SOLN
500.0000 mg | INTRAVENOUS | Status: AC
  Administered 2017-11-29 – 2017-12-02 (×4): 500 mg via INTRAVENOUS
  Filled 2017-11-29 (×4): qty 500

## 2017-11-29 MED ORDER — CLONAZEPAM 1 MG PO TABS
1.0000 mg | ORAL_TABLET | Freq: Every day | ORAL | Status: DC
  Administered 2017-11-29 – 2017-12-03 (×6): 1 mg via ORAL
  Filled 2017-11-29 (×5): qty 1

## 2017-11-29 MED ORDER — POLYETHYLENE GLYCOL 3350 17 G PO PACK: 17.0000 g | PACK | Freq: Every day | ORAL | Status: DC | PRN

## 2017-11-29 MED ORDER — AMLODIPINE BESYLATE 5 MG PO TABS
5.0000 mg | ORAL_TABLET | Freq: Two times a day (BID) | ORAL | Status: DC
  Administered 2017-11-29 – 2017-12-04 (×12): 5 mg via ORAL
  Filled 2017-11-29 (×12): qty 1

## 2017-11-29 MED ORDER — GUAIFENESIN-DM 100-10 MG/5ML PO SYRP
5.0000 mL | ORAL_SOLUTION | ORAL | Status: DC | PRN
  Administered 2017-11-29 – 2017-12-01 (×9): 5 mL via ORAL
  Filled 2017-11-29 (×9): qty 10

## 2017-11-29 MED ORDER — CEFTRIAXONE SODIUM 2 G IJ SOLR
2.0000 g | INTRAMUSCULAR | Status: AC
  Administered 2017-11-29 – 2017-12-02 (×5): 2 g via INTRAVENOUS
  Filled 2017-11-29: qty 20
  Filled 2017-11-29 (×3): qty 2

## 2017-11-29 MED ORDER — MONTELUKAST SODIUM 10 MG PO TABS
10.0000 mg | ORAL_TABLET | Freq: Every day | ORAL | Status: DC
  Administered 2017-11-29 – 2017-12-03 (×6): 10 mg via ORAL
  Filled 2017-11-29 (×6): qty 1

## 2017-11-29 MED ORDER — MECLIZINE HCL 25 MG PO TABS
25.0000 mg | ORAL_TABLET | Freq: Three times a day (TID) | ORAL | Status: DC | PRN
  Administered 2017-11-30: 25 mg via ORAL
  Filled 2017-11-29: qty 1

## 2017-11-29 MED ORDER — ATENOLOL 25 MG PO TABS
25.0000 mg | ORAL_TABLET | Freq: Every day | ORAL | Status: DC
  Administered 2017-11-29 – 2017-12-03 (×6): 25 mg via ORAL
  Filled 2017-11-29 (×6): qty 1

## 2017-11-29 MED ORDER — ESCITALOPRAM OXALATE 10 MG PO TABS
20.0000 mg | ORAL_TABLET | Freq: Every day | ORAL | Status: DC
  Administered 2017-11-29 – 2017-12-03 (×6): 20 mg via ORAL
  Filled 2017-11-29 (×6): qty 2

## 2017-11-29 MED ORDER — METHYLPREDNISOLONE SODIUM SUCC 40 MG IJ SOLR
40.0000 mg | Freq: Four times a day (QID) | INTRAMUSCULAR | Status: DC
  Administered 2017-11-29 – 2017-12-01 (×9): 40 mg via INTRAVENOUS
  Filled 2017-11-29 (×9): qty 1

## 2017-11-29 MED ORDER — ATORVASTATIN CALCIUM 10 MG PO TABS
10.0000 mg | ORAL_TABLET | Freq: Every day | ORAL | Status: DC
  Administered 2017-12-01 – 2017-12-04 (×4): 10 mg via ORAL
  Filled 2017-11-29 (×5): qty 1

## 2017-11-29 MED ORDER — FLUTICASONE PROPIONATE 50 MCG/ACT NA SUSP
1.0000 | Freq: Every evening | NASAL | Status: DC
  Administered 2017-11-29 – 2017-12-04 (×6): 1 via NASAL
  Filled 2017-11-29: qty 16

## 2017-11-29 MED ORDER — CLONAZEPAM 0.5 MG PO TABS
0.5000 mg | ORAL_TABLET | Freq: Two times a day (BID) | ORAL | Status: DC
  Administered 2017-11-29 – 2017-12-04 (×12): 0.5 mg via ORAL
  Filled 2017-11-29 (×12): qty 1

## 2017-11-29 NOTE — H&P (Signed)
History and Physical    [name] ZOX:[id number] DOB: [birth year] DOA: 11/28/2017  PCP: Henrine Screws, MD  Patient coming from: Home  I have personally briefly reviewed patient's old medical records in [facility]  Chief Complaint: Cough  HPI: [name] is a 72 y.o. female with medical history significant of anxiety, pseudoseizures, HTN.  Patient presents to the ED with c/o cough ongoing for past 3 weeks, got worse 2 weeks ago.  Cough is productive of yellow sputum.  CXR at [facility] showed infiltrates.  O2 sats 89-90 on RA.   ED Course: Patient satting 94-95% on 4L.  BNP 24, lactate 0.61.  CXR suggestive of atypical PNA.  Put on rocephin / azithro.   Review of Systems: As per HPI otherwise 10 point review of systems negative.   Past Medical History:  Diagnosis Date  . Anemia   . Anxiety   . Arthritis   . Bladder incontinence   . Bowel incontinence   . Breast cancer ([facility]) 08/16/13   left, 1 o'clock  . Chronic cystitis   . Chronic diarrhea   . Complication of anesthesia    hard to wake up  . Depression   . Full dentures   . GERD (gastroesophageal reflux disease)   . High blood pressure   . History of radiation therapy 11/02/13- 12/24/13   left breast 4680 cGy in 26 sessions, left breast boost 1400 cGy in 7 sessions  . HOH (hard of hearing)   . Hx of colonic polyps 12/03/2016  . Hypothyroidism   . Pneumonia    hx  . Pseudoseizures   . Shortness of breath    when walks  . Sleep apnea    has a cpap-5 hr/ not used in years  . Stroke ([facility]) 1980   legs weak  . Tremor   . Urinary incontinence   . Vertigo     Past Surgical History:  Procedure Laterality Date  . ABDOMINAL HYSTERECTOMY    . BACK SURGERY  2005   lumbar fusion  . BREAST LUMPECTOMY WITH NEEDLE LOCALIZATION AND AXILLARY SENTINEL LYMPH NODE BX Left 09/29/2013   Procedure: BREAST LUMPECTOMY WITH NEEDLE LOCALIZATION AND AXILLARY SENTINEL LYMPH NODE BX;  Surgeon: Almond Lint, MD;  Location: [facility];  Service: General;  Laterality: Left;  clean wound class  . COLONOSCOPY    . MULTIPLE TOOTH EXTRACTIONS    . ORIF WRIST FRACTURE Left 07/08/2015   Procedure: OPEN REDUCTION INTERNAL FIXATION (ORIF) LEFT WRIST FRACTURE AND REPAIR AS INDICATED;  Surgeon: Bradly Bienenstock, MD;  Location: [facility];  Service: Orthopedics;  Laterality: Left;  . ovarian cysts    . TOTAL HIP ARTHROPLASTY  2005   rt total hip     reports that she has never smoked. She has never used smokeless tobacco. She reports that she does not drink alcohol or use drugs.  Allergies  Allergen Reactions  . Codeine Nausea And Vomiting    Family History  Problem Relation Age of Onset  . Lung cancer Brother   . Diabetes Brother   . Stroke Mother   . Ataxia Neg Hx   . Chorea Neg Hx   . Dementia Neg Hx   . Mental retardation Neg Hx   . Migraines Neg Hx   . Multiple sclerosis Neg Hx   . Neurofibromatosis Neg Hx   . Neuropathy Neg Hx   . Parkinsonism Neg Hx   . Seizures Neg Hx  Prior to Admission medications   Medication Sig Start Date End Date Taking? Authorizing Provider  amLODipine (NORVASC) 5 MG tablet Take 5 mg by mouth 2 (two) times daily.  06/01/13  Yes [provider]  clonazePAM (KLONOPIN) 1 MG tablet Take by mouth. Takes 0.5mg  BID and 1 tablet at bedtime.   Yes [provider]  escitalopram (LEXAPRO) 20 MG tablet Take 20 mg by mouth at bedtime.    Yes [provider]  fluticasone (FLONASE) 50 MCG/ACT nasal spray Place 1 spray into both nostrils every evening.  10/20/17  Yes [provider]  levothyroxine (SYNTHROID, LEVOTHROID) 50 MCG tablet Take 50 mcg by mouth daily before breakfast.  06/28/13  Yes [provider]  montelukast (SINGULAIR) 10 MG tablet Take 10 mg by mouth at bedtime.   Yes [provider]  ranitidine (ZANTAC) 150 MG tablet Take 150 mg by mouth 2 (two) times daily. 07/23/16  Yes [provider]  atenolol (TENORMIN) 25 MG  tablet Take 25 mg by mouth at bedtime.     [provider]  atorvastatin (LIPITOR) 10 MG tablet Take 10 mg by mouth daily.  10/24/17   [provider]  Loperamide HCl (IMODIUM PO) Take 1 tablet by mouth daily as needed (diarrhea).    [provider]  meclizine (ANTIVERT) 25 MG tablet Take 1 tablet (25 mg total) by mouth 3 (three) times daily as needed for dizziness. 09/20/16   Hedges, Tinnie Gens, PA-C  naproxen sodium (ANAPROX) 220 MG tablet Take 440 mg by mouth 2 (two) times daily as needed (pain).     [provider]  nitroGLYCERIN (NITROSTAT) 0.4 MG SL tablet Place 1 tablet (0.4 mg total) under the tongue every 5 (five) minutes as needed for chest pain. 10/27/17 01/25/18  RevankarAundra Dubin, MD    Physical Exam: Vitals:   11/28/17 2054 11/28/17 2059 11/29/17 0043  BP:  (!) 153/90   Pulse:  75   Resp:  16   Temp:  98.6 F (37 C)   TempSrc:  Oral   SpO2:  95% 94%  Weight: 107 kg (236 lb)    Height: 5\' 3"  (1.6 m)      Constitutional: NAD, calm, comfortable Eyes: PERRL, lids and conjunctivae normal ENMT: Mucous membranes are moist. Posterior pharynx clear of any exudate or lesions.Normal dentition.  Neck: normal, supple, no masses, no thyromegaly Respiratory: Few diffuse wheezes Cardiovascular: Regular rate and rhythm, no murmurs / rubs / gallops. No extremity edema. 2+ pedal pulses. No carotid bruits.  Abdomen: no tenderness, no masses palpated. No hepatosplenomegaly. Bowel sounds positive.  Musculoskeletal: no clubbing / cyanosis. No joint deformity upper and lower extremities. Good ROM, no contractures. Normal muscle tone.  Skin: no rashes, lesions, ulcers. No induration Neurologic: CN 2-12 grossly intact. Sensation intact, DTR normal. Strength 5/5 in all 4.  Psychiatric: Normal judgment and insight. Alert and oriented x 3. Normal mood.    Labs on Admission: I have personally reviewed following labs and imaging studies  CBC: Recent Labs  Lab  11/28/17 2223  WBC 7.2  NEUTROABS 5.2  HGB 14.8  HCT 45.8  MCV 100.0  PLT 129*   Basic Metabolic Panel: Recent Labs  Lab 11/28/17 2223  NA 140  K 4.1  CL 107  CO2 25  GLUCOSE 107*  BUN 20  CREATININE 0.85  CALCIUM 8.3*   GFR: Estimated Creatinine Clearance: 71.1 mL/min (by C-G formula based on SCr of 0.85 mg/dL). Liver Function Tests: Recent Labs  Lab 11/28/17 2223  AST 24  ALT 17  ALKPHOS 42  BILITOT 0.9  PROT 6.6  ALBUMIN 3.5   No results for input(s): LIPASE, AMYLASE in the last 168 hours. No results for input(s): AMMONIA in the last 168 hours. Coagulation Profile: No results for input(s): INR, PROTIME in the last 168 hours. Cardiac Enzymes: Recent Labs  Lab 11/28/17 2223  TROPONINI 0.04*   BNP (last 3 results) No results for input(s): PROBNP in the last 8760 hours. HbA1C: No results for input(s): HGBA1C in the last 72 hours. CBG: No results for input(s): GLUCAP in the last 168 hours. Lipid Profile: No results for input(s): CHOL, HDL, LDLCALC, TRIG, CHOLHDL, LDLDIRECT in the last 72 hours. Thyroid Function Tests: No results for input(s): TSH, T4TOTAL, FREET4, T3FREE, THYROIDAB in the last 72 hours. Anemia Panel: No results for input(s): VITAMINB12, FOLATE, FERRITIN, TIBC, IRON, RETICCTPCT in the last 72 hours. Urine analysis:    Component Value Date/Time   COLORURINE YELLOW 07/24/2015 2241   APPEARANCEUR CLOUDY (A) 07/24/2015 2241   LABSPEC 1.010 07/24/2015 2241   PHURINE 6.5 07/24/2015 2241   GLUCOSEU NEGATIVE 07/24/2015 2241   HGBUR MODERATE (A) 07/24/2015 2241   BILIRUBINUR NEGATIVE 07/24/2015 2241   KETONESUR NEGATIVE 07/24/2015 2241   PROTEINUR 30 (A) 07/24/2015 2241   UROBILINOGEN 1.0 03/20/2015 0105   NITRITE NEGATIVE 07/24/2015 2241   LEUKOCYTESUR LARGE (A) 07/24/2015 2241    Radiological Exams on Admission: Dg Chest 2 View  Result Date: 11/29/2017 CLINICAL DATA:  72 y/o F; shortness of breath and cough for 3 weeks. EXAM: CHEST -  2 VIEW COMPARISON:  07/24/2015 chest radiograph FINDINGS: Stable cardiomegaly given projection and technique. Aortic atherosclerosis with calcification. Mild reticular opacities of the lungs. No consolidation. No pleural effusion or pneumothorax. No acute osseous abnormality is evident. Surgical clips project over the left axilla. IMPRESSION: Mild reticular opacities of the lungs may represent interstitial edema or atypical pneumonia. No consolidation. Stable cardiomegaly. Electronically Signed   By: Mitzi Hansen M.D.   On: 11/29/2017 00:09    EKG: Independently reviewed.  Assessment/Plan Principal Problem:   Atypical pneumonia Active Problems:   MDD (major depressive disorder), recurrent severe, without psychosis ([facility])   Tremor    1. Atypical PNA - 1. PNA pathway 2. Rocephin / azithro 3. O2 via [place] 4. Cultures pending 5. Adult wheeze protocol for breathing treatments 6. Will hold off on steroids for now though 2. Tremor - continue home meds 3. MDD - cont home meds 4. HTN - cont home meds  DVT prophylaxis: Lovenox Code Status: Full Family Communication: No family in room Disposition Plan: Home after admit Consults called: None Admission status: Place in obs   Grae Cannata, [place]. DO [facility] Pager [phone]  If 7AM-7PM, please contact day team taking care of patient [email or web address] Password [email or web address]  11/29/2017, 1:18 AM

## 2017-11-29 NOTE — Progress Notes (Signed)
PROGRESS NOTE    [name]  WUJ:[id number] DOB: Nov 20, 1945 DOA: 11/28/2017 PCP: Aura Dials, MD     Brief Narrative:   72 year old with past medical history relevant for depression, essential tremor, nonepileptic seizures, hypertension, hyperlipidemia, hypothyroidism admitted with hypoxia secondary to community-acquired pneumonia.   Assessment & Plan:   Principal Problem:   Atypical pneumonia Active Problems:   MDD (major depressive disorder), recurrent severe, without psychosis ([place])   Tremor   #) Community acquired pneumonia: Patient continues to require 1 L nasal cannula.  She is otherwise relatively unchanged.  On exam today she was wheezing a great deal. -Start IV methylprednisolone -Continue ceftriaxone and azithromycin IV -Wean oxygen as tolerated -Follow-up blood cultures from 11/28/2017  #) Hypertension: -Continue amlodipine 5 mg twice daily  #) Hyperlipidemia: -Continue atorvastatin 10 mg daily  #) Hypothyroidism: -Continue levothyroxine 50 mcg before breakfast  #) Essential tremor: -Continue atenolol 25 mg nightly  #) Psych: -Continue clonazepam 0.5 mg twice daily and 1 mg nightly -Continue citalopram 20 mg nightly  Fluids: Tolerating p.o. Electrolytes: Monitor and supplement Nutrition: Heart healthy diet  Prophylaxis: Enoxaparin  Disposition: Pending weaning to room air  Full code     Consultants:   None  Procedures: (Don't include imaging studies which can be auto populated. Include things that cannot be auto populated i.e. Echo, Carotid and venous dopplers, Foley, Bipap, HD, tubes/drains, wound vac, central lines etc)  None  Antimicrobials: (specify start and planned stop date. Auto populated tables are space occupying and do not give end dates)  IV ceftriaxone and azithromycin started 11/28/2017   Subjective: Patient reports that she is not much improved from admission.  She continues to report shortness of breath, congestion,  cough and wheezing.  She denies any nausea, abdominal pain, diarrhea  Objective: Vitals:   11/29/17 0519 11/29/17 0747 11/29/17 0800 11/29/17 1007  BP: 112/62  138/71   Pulse: 96     Resp: 13  13   Temp: 98.4 F (36.9 C)  98.2 F (36.8 C)   TempSrc: Oral  Oral   SpO2: 94% 90% 98% 95%  Weight:      Height:        Intake/Output Summary (Last 24 hours) at 11/29/2017 1333 Last data filed at 11/29/2017 0930 Gross per 24 hour  Intake 1950 ml  Output -  Net 1950 ml   Filed Weights   11/28/17 2054  Weight: 107 kg (236 lb)    Examination:  General exam: Appears calm and comfortable  Respiratory system: Mildly increased work of breathing, diffuse inspiratory and expiratory rhonchi and wheezes throughout all lung fields, no rales Cardiovascular system: Distant heart sounds, no murmurs. Gastrointestinal system: Soft, nontender, nondistended Central nervous system: Alert and oriented. No focal neurological deficits. Extremities: Trace lower extremity edema Skin: No rashes on visible skin Psychiatry: Judgement and insight appear normal. Mood & affect appropriate.     Data Reviewed: I have personally reviewed following labs and imaging studies  CBC: Recent Labs  Lab 11/28/17 2223  WBC 7.2  NEUTROABS 5.2  HGB 14.8  HCT 45.8  MCV 100.0  PLT 956*   Basic Metabolic Panel: Recent Labs  Lab 11/28/17 2223  NA 140  K 4.1  CL 107  CO2 25  GLUCOSE 107*  BUN 20  CREATININE 0.85  CALCIUM 8.3*   GFR: Estimated Creatinine Clearance: 71.1 mL/min (by C-G formula based on SCr of 0.85 mg/dL). Liver Function Tests: Recent Labs  Lab 11/28/17 2223  AST 24  ALT 17  ALKPHOS 42  BILITOT 0.9  PROT 6.6  ALBUMIN 3.5   No results for input(s): LIPASE, AMYLASE in the last 168 hours. No results for input(s): AMMONIA in the last 168 hours. Coagulation Profile: No results for input(s): INR, PROTIME in the last 168 hours. Cardiac Enzymes: Recent Labs  Lab 11/28/17 2223  TROPONINI  0.04*   BNP (last 3 results) No results for input(s): PROBNP in the last 8760 hours. HbA1C: No results for input(s): HGBA1C in the last 72 hours. CBG: No results for input(s): GLUCAP in the last 168 hours. Lipid Profile: No results for input(s): CHOL, HDL, LDLCALC, TRIG, CHOLHDL, LDLDIRECT in the last 72 hours. Thyroid Function Tests: No results for input(s): TSH, T4TOTAL, FREET4, T3FREE, THYROIDAB in the last 72 hours. Anemia Panel: No results for input(s): VITAMINB12, FOLATE, FERRITIN, TIBC, IRON, RETICCTPCT in the last 72 hours. Sepsis Labs: Recent Labs  Lab 11/28/17 2223  LATICACIDVEN 0.61    No results found for this or any previous visit (from the past 240 hour(s)).       Radiology Studies: Dg Chest 2 View  Result Date: 11/29/2017 CLINICAL DATA:  72 y/o F; shortness of breath and cough for 3 weeks. EXAM: CHEST - 2 VIEW COMPARISON:  07/24/2015 chest radiograph FINDINGS: Stable cardiomegaly given projection and technique. Aortic atherosclerosis with calcification. Mild reticular opacities of the lungs. No consolidation. No pleural effusion or pneumothorax. No acute osseous abnormality is evident. Surgical clips project over the left axilla. IMPRESSION: Mild reticular opacities of the lungs may represent interstitial edema or atypical pneumonia. No consolidation. Stable cardiomegaly. Electronically Signed   By: Kristine Garbe M.D.   On: 11/29/2017 00:09        Scheduled Meds: . amLODipine  5 mg Oral BID  . atenolol  25 mg Oral QHS  . atorvastatin  10 mg Oral q1800  . clonazePAM  0.5 mg Oral BID  . clonazePAM  1 mg Oral QHS  . enoxaparin (LOVENOX) injection  40 mg Subcutaneous Q24H  . escitalopram  20 mg Oral QHS  . fluticasone  1 spray Each Nare QPM  . ipratropium-albuterol  3 mL Nebulization TID  . levothyroxine  50 mcg Oral QAC breakfast  . methylPREDNISolone (SOLU-MEDROL) injection  40 mg Intravenous Q6H  . montelukast  10 mg Oral QHS   Continuous  Infusions: . azithromycin    . cefTRIAXone (ROCEPHIN)  IV       LOS: 0 days    Time spent: Adrian, MD [facility]   If 7PM-7AM, please contact night-coverage [email or web address] Password [email or web address] 11/29/2017, 1:33 PM

## 2017-11-29 NOTE — ED Notes (Signed)
ED TO INPATIENT HANDOFF REPORT  Name/Age/Gender [name] 72 y.o. female  Code Status    Code Status Orders  (From admission, onward)        Start     Ordered   11/29/17 0055  Full code  Continuous     11/29/17 0056    Code Status History    Date Active Date Inactive Code Status Order ID Comments User Context   07/25/2015 0149 08/01/2015 1832 Full Code [id number]  Etta Quill, DO ED   07/08/2015 1335 07/09/2015 1920 Full Code [id number]  Iran Planas, MD Inpatient   03/20/2015 0201 03/21/2015 1622 Full Code [id number]  Julianne Rice, MD ED      Home/SNF/Other Home  Chief Complaint shortness of breath  Level of Care/Admitting Diagnosis ED Disposition    ED Disposition Condition [facility] Area: [facility] [[id number]]  Level of Care: Med-Surg [16]  Diagnosis: Atypical pneumonia [786767]  Admitting Physician: Etta Quill [[id number]]  Attending Physician: Etta Quill [[id number]]  PT Class (Do Not Modify): Observation [104]  PT Acc Code (Do Not Modify): Observation [10022]       Medical History Past Medical History:  Diagnosis Date  . Anemia   . Anxiety   . Arthritis   . Bladder incontinence   . Bowel incontinence   . Breast cancer ([place]) 08/16/13   left, 1 o'clock  . Chronic cystitis   . Chronic diarrhea   . Complication of anesthesia    hard to wake up  . Depression   . Full dentures   . GERD (gastroesophageal reflux disease)   . High blood pressure   . History of radiation therapy 11/02/13- 12/24/13   left breast 4680 cGy in 26 sessions, left breast boost 1400 cGy in 7 sessions  . HOH (hard of hearing)   . Hx of colonic polyps 12/03/2016  . Hypothyroidism   . Pneumonia    hx  . Pseudoseizures   . Shortness of breath    when walks  . Sleep apnea    has a cpap-5 hr/ not used in years  . Stroke ([place]) 1980   legs weak  . Tremor   . Urinary incontinence   . Vertigo     Allergies Allergies  Allergen  Reactions  . Codeine Nausea And Vomiting    IV Location/Drains/Wounds Patient Lines/Drains/Airways Status   Active Line/Drains/Airways    Name:   Placement date:   Placement time:   Site:   Days:   Peripheral IV 11/29/17 Right Hand   11/29/17    0010    Hand   less than 1          Labs/Imaging Results for orders placed or performed during the hospital encounter of 11/28/17 (from the past 48 hour(s))  CBC with Differential     Status: Abnormal   Collection Time: 11/28/17 10:23 PM  Result Value Ref Range   WBC 7.2 4.0 - 10.5 K/uL   RBC 4.58 3.87 - 5.11 MIL/uL   Hemoglobin 14.8 12.0 - 15.0 g/dL   HCT 45.8 36.0 - 46.0 %   MCV 100.0 78.0 - 100.0 fL   MCH 32.3 26.0 - 34.0 pg   MCHC 32.3 30.0 - 36.0 g/dL   RDW 14.5 11.5 - 15.5 %   Platelets 129 (L) 150 - 400 K/uL   Neutrophils Relative % 74 %   Neutro Abs 5.2 1.7 - 7.7 K/uL   Lymphocytes Relative 18 %  Lymphs Abs 1.3 0.7 - 4.0 K/uL   Monocytes Relative 6 %   Monocytes Absolute 0.5 0.1 - 1.0 K/uL   Eosinophils Relative 2 %   Eosinophils Absolute 0.2 0.0 - 0.7 K/uL   Basophils Relative 0 %   Basophils Absolute 0.0 0.0 - 0.1 K/uL    Comment: Performed at [facility], [place] [address] [place], [place] [address]  Comprehensive metabolic panel     Status: Abnormal   Collection Time: 11/28/17 10:23 PM  Result Value Ref Range   Sodium 140 135 - 145 mmol/L   Potassium 4.1 3.5 - 5.1 mmol/L   Chloride 107 101 - 111 mmol/L   CO2 25 22 - 32 mmol/L   Glucose, Bld 107 (H) 65 - 99 mg/dL   BUN 20 6 - 20 mg/dL   Creatinine, Ser 0.85 0.44 - 1.00 mg/dL   Calcium 8.3 (L) 8.9 - 10.3 mg/dL   Total Protein 6.6 6.5 - 8.1 g/dL   Albumin 3.5 3.5 - 5.0 g/dL   AST 24 15 - 41 U/L   ALT 17 14 - 54 U/L   Alkaline Phosphatase 42 38 - 126 U/L   Total Bilirubin 0.9 0.3 - 1.2 mg/dL   GFR calc non Af Amer >60 >60 mL/min   GFR calc Af Amer >60 >60 mL/min    Comment: (NOTE) The eGFR has been calculated using the CKD EPI  equation. This calculation has not been validated in all clinical situations. eGFR's persistently <60 mL/min signify possible Chronic Kidney Disease.    Anion gap 8.0 5 - 15    Comment: Performed at [facility], [place] [address] [place], [place] [address]  Brain natriuretic peptide     Status: None   Collection Time: 11/28/17 10:23 PM  Result Value Ref Range   B Natriuretic Peptide 24.9 0.0 - 100.0 pg/mL    Comment: Performed at [facility], [place] [address], [place], [place] [address]  Troponin I     Status: Abnormal   Collection Time: 11/28/17 10:23 PM  Result Value Ref Range   Troponin I 0.04 (HH) <0.03 ng/mL    Comment: CRITICAL RESULT CALLED TO, READ BACK BY AND VERIFIED WITH: Harold Mattes,A AT 2349 ON 11/28/2017 BY MOSLEY,J Performed at [facility], [place] [address] [place], [place] [address]   I-Stat CG4 Lactic Acid, ED     Status: None   Collection Time: 11/28/17 10:23 PM  Result Value Ref Range   Lactic Acid, Venous 0.61 0.5 - 1.9 mmol/L   Dg Chest 2 View  Result Date: 11/29/2017 CLINICAL DATA:  72 y/o F; shortness of breath and cough for 3 weeks. EXAM: CHEST - 2 VIEW COMPARISON:  07/24/2015 chest radiograph FINDINGS: Stable cardiomegaly given projection and technique. Aortic atherosclerosis with calcification. Mild reticular opacities of the lungs. No consolidation. No pleural effusion or pneumothorax. No acute osseous abnormality is evident. Surgical clips project over the left axilla. IMPRESSION: Mild reticular opacities of the lungs may represent interstitial edema or atypical pneumonia. No consolidation. Stable cardiomegaly. Electronically Signed   By: Kristine Garbe M.D.   On: 11/29/2017 00:09    Pending Labs Unresulted Labs (From admission, onward)   Start     Ordered   11/29/17 0050  Culture, sputum-assessment  Once,   R     11/29/17 0056   11/29/17 0050  Gram stain  Once,   R     11/29/17 0056   11/29/17  0050  HIV antibody (Routine Screening)  Once,   R     11/29/17 0056   11/29/17 0050  Strep pneumoniae urinary antigen  Once,   R     11/29/17 0056   11/28/17 2154  Blood culture (routine x 2)  BLOOD CULTURE X 2,   STAT     11/28/17 2154      Vitals/Pain Today's Vitals   11/29/17 0043 11/29/17 0100 11/29/17 0200 11/29/17 0230  BP:  (!) 163/112 128/86 (!) 142/86  Pulse:  91 77 73  Resp:  (!) _0 Temp:      TempSrc:      SpO2: 94% 94% 93% 95%  Weight:      Height:      PainSc:        Isolation Precautions No active isolations  Medications Medications  enoxaparin (LOVENOX) injection 40 mg (has no administration in time range)  cefTRIAXone (ROCEPHIN) 2 g in sodium chloride 0.9 % 100 mL IVPB (has no administration in time range)  azithromycin (ZITHROMAX) 500 mg in sodium chloride 0.9 % 250 mL IVPB (has no administration in time range)  amLODipine (NORVASC) tablet 5 mg (has no administration in time range)  atenolol (TENORMIN) tablet 25 mg (has no administration in time range)  atorvastatin (LIPITOR) tablet 10 mg (has no administration in time range)  escitalopram (LEXAPRO) tablet 20 mg (has no administration in time range)  clonazePAM (KLONOPIN) tablet 0.5 mg (has no administration in time range)  levothyroxine (SYNTHROID, LEVOTHROID) tablet 50 mcg (has no administration in time range)  fluticasone (FLONASE) 50 MCG/ACT nasal spray 1 spray (has no administration in time range)  montelukast (SINGULAIR) tablet 10 mg (has no administration in time range)  meclizine (ANTIVERT) tablet 25 mg (has no administration in time range)  ipratropium-albuterol (DUONEB) 0.5-2.5 (3) MG/3ML nebulizer solution 3 mL (has no administration in time range)  clonazePAM (KLONOPIN) tablet 1 mg (has no administration in time range)  ipratropium-albuterol (DUONEB) 0.5-2.5 (3) MG/3ML nebulizer solution 3 mL (3 mLs Nebulization Given 11/28/17 2229)  sodium chloride 0.9 % bolus 1,000 mL (0 mLs Intravenous  Stopped 11/29/17 0014)  cefTRIAXone (ROCEPHIN) 2 g in sodium chloride 0.9 % 100 mL IVPB (0 g Intravenous Stopped 11/28/17 2301)  azithromycin (ZITHROMAX) 500 mg in sodium chloride 0.9 % 250 mL IVPB (0 mg Intravenous Stopped 11/29/17 0014)  LORazepam (ATIVAN) injection 1 mg (1 mg Intravenous Given 11/28/17 2321)  ipratropium-albuterol (DUONEB) 0.5-2.5 (3) MG/3ML nebulizer solution 3 mL (3 mLs Nebulization Given 11/29/17 0043)    Mobility walks

## 2017-11-30 DIAGNOSIS — I712 Thoracic aortic aneurysm, without rupture: Secondary | ICD-10-CM | POA: Diagnosis present

## 2017-11-30 DIAGNOSIS — Z885 Allergy status to narcotic agent status: Secondary | ICD-10-CM | POA: Diagnosis not present

## 2017-11-30 DIAGNOSIS — J129 Viral pneumonia, unspecified: Secondary | ICD-10-CM | POA: Diagnosis present

## 2017-11-30 DIAGNOSIS — Z79899 Other long term (current) drug therapy: Secondary | ICD-10-CM | POA: Diagnosis not present

## 2017-11-30 DIAGNOSIS — G250 Essential tremor: Secondary | ICD-10-CM | POA: Diagnosis present

## 2017-11-30 DIAGNOSIS — Z96641 Presence of right artificial hip joint: Secondary | ICD-10-CM | POA: Diagnosis present

## 2017-11-30 DIAGNOSIS — Z801 Family history of malignant neoplasm of trachea, bronchus and lung: Secondary | ICD-10-CM | POA: Diagnosis not present

## 2017-11-30 DIAGNOSIS — Z923 Personal history of irradiation: Secondary | ICD-10-CM | POA: Diagnosis not present

## 2017-11-30 DIAGNOSIS — H9190 Unspecified hearing loss, unspecified ear: Secondary | ICD-10-CM | POA: Diagnosis present

## 2017-11-30 DIAGNOSIS — F332 Major depressive disorder, recurrent severe without psychotic features: Secondary | ICD-10-CM | POA: Diagnosis present

## 2017-11-30 DIAGNOSIS — Z8673 Personal history of transient ischemic attack (TIA), and cerebral infarction without residual deficits: Secondary | ICD-10-CM | POA: Diagnosis not present

## 2017-11-30 DIAGNOSIS — E785 Hyperlipidemia, unspecified: Secondary | ICD-10-CM | POA: Diagnosis present

## 2017-11-30 DIAGNOSIS — Z823 Family history of stroke: Secondary | ICD-10-CM | POA: Diagnosis not present

## 2017-11-30 DIAGNOSIS — J189 Pneumonia, unspecified organism: Secondary | ICD-10-CM | POA: Diagnosis not present

## 2017-11-30 DIAGNOSIS — Z981 Arthrodesis status: Secondary | ICD-10-CM | POA: Diagnosis not present

## 2017-11-30 DIAGNOSIS — Z853 Personal history of malignant neoplasm of breast: Secondary | ICD-10-CM | POA: Diagnosis not present

## 2017-11-30 DIAGNOSIS — K219 Gastro-esophageal reflux disease without esophagitis: Secondary | ICD-10-CM | POA: Diagnosis present

## 2017-11-30 DIAGNOSIS — I119 Hypertensive heart disease without heart failure: Secondary | ICD-10-CM | POA: Diagnosis present

## 2017-11-30 DIAGNOSIS — Z833 Family history of diabetes mellitus: Secondary | ICD-10-CM | POA: Diagnosis not present

## 2017-11-30 DIAGNOSIS — Z90710 Acquired absence of both cervix and uterus: Secondary | ICD-10-CM | POA: Diagnosis not present

## 2017-11-30 DIAGNOSIS — R062 Wheezing: Secondary | ICD-10-CM | POA: Diagnosis present

## 2017-11-30 DIAGNOSIS — Z86010 Personal history of colonic polyps: Secondary | ICD-10-CM | POA: Diagnosis not present

## 2017-11-30 DIAGNOSIS — R0902 Hypoxemia: Secondary | ICD-10-CM | POA: Diagnosis present

## 2017-11-30 DIAGNOSIS — N3020 Other chronic cystitis without hematuria: Secondary | ICD-10-CM | POA: Diagnosis present

## 2017-11-30 DIAGNOSIS — E039 Hypothyroidism, unspecified: Secondary | ICD-10-CM | POA: Diagnosis present

## 2017-11-30 DIAGNOSIS — G4730 Sleep apnea, unspecified: Secondary | ICD-10-CM | POA: Diagnosis present

## 2017-11-30 MED ORDER — BENZONATATE 100 MG PO CAPS
100.0000 mg | ORAL_CAPSULE | Freq: Three times a day (TID) | ORAL | Status: DC | PRN
  Administered 2017-11-30 – 2017-12-01 (×2): 100 mg via ORAL
  Filled 2017-11-30 (×2): qty 1

## 2017-11-30 NOTE — Evaluation (Addendum)
Physical Therapy Evaluation Patient Details Name: [name] MRN: [id number] DOB: Jan 22, 1946 Today's Date: 11/30/2017   History of Present Illness  72 yo female admitted with atypical Pna. Hx tremors, pseudoseizures, MDD, anxiety, vertigo, breast cancer, anxiety, L wrist ORIF    Clinical Impression  On eval, pt required Mod assist (+2 safety) for mobility. Noted tremors prior to mobility. Assisted NT with getting pt to EOB in preparation for transferring to [facility]. Once sitting EOB, pt began shaking as if she were having a seizure. Assisted pt back to supine. Shaking lasted for ~10 seconds total time. RN made aware and took over care. Will follow and progress activity as tolerated. At this time, recommendation is for SNF.     Follow Up Recommendations SNF(unless mobility and safety improve significantly)    Equipment Recommendations  None recommended by PT    Recommendations for Other Services OT consult     Precautions / Restrictions Precautions Precautions: Fall Precaution Comments: sz Restrictions Weight Bearing Restrictions: No      Mobility  Bed Mobility Overal bed mobility: Needs Assistance Bed Mobility: Supine to Sit;Sit to Supine     Supine to sit: Mod assist Sit to supine: Mod assist   General bed mobility comments: Assist for trunk and LEs. Increased time. Once sitting at EOB pt began shaking for ~10 seconds-possible seizure??? Assisted back to supine. RN made aware and took over care.   Transfers                 General transfer comment: NT  Ambulation/Gait                Stairs            Wheelchair Mobility    Modified Rankin (Stroke Patients Only)       Balance                                             Pertinent Vitals/Pain Pain Assessment: No/denies pain    Home Living Family/patient expects to be discharged to:: Unsure Living Arrangements: Alone   Type of Home: Apartment Home Access: Level entry      Home Layout: (apt is on 2nd floor. complex has elevators) Home Equipment: Walker - 2 wheels;Cane - single point;Walker - 4 wheels      Prior Function Level of Independence: Independent         Comments: pt states she still drives     Hand Dominance        Extremity/Trunk Assessment   Upper Extremity Assessment Upper Extremity Assessment: Generalized weakness    Lower Extremity Assessment Lower Extremity Assessment: Generalized weakness    Cervical / Trunk Assessment Cervical / Trunk Assessment: Normal  Communication   Communication: No difficulties  Cognition Arousal/Alertness: Awake/alert Behavior During Therapy: Anxious Overall Cognitive Status: Within Functional Limits for tasks assessed                                        General Comments      Exercises     Assessment/Plan    PT Assessment Patient needs continued PT services  PT Problem List Decreased strength;Decreased balance;Decreased mobility;Decreased activity tolerance;Decreased knowledge of use of DME       PT Treatment Interventions DME instruction;Gait training;Therapeutic activities;Functional  mobility training;Balance training;Patient/family education;Therapeutic exercise    PT Goals (Current goals can be found in the Care Plan section)  Acute Rehab PT Goals Patient Stated Goal: none stated PT Goal Formulation: With patient Time For Goal Achievement: 12/14/17 Potential to Achieve Goals: Good    Frequency Min 3X/week   Barriers to discharge        Co-evaluation               AM-PAC PT "6 Clicks" Daily Activity  Outcome Measure Difficulty turning over in bed (including adjusting bedclothes, sheets and blankets)?: A Little Difficulty moving from lying on back to sitting on the side of the bed? : Unable Difficulty sitting down on and standing up from a chair with arms (e.g., wheelchair, bedside commode, etc,.)?: Unable Help needed moving to and from a bed  to chair (including a wheelchair)?: A Lot Help needed walking in hospital room?: A Lot Help needed climbing 3-5 steps with a railing? : A Lot 6 Click Score: 11    End of Session   Activity Tolerance: Treatment limited secondary to medical complications (Comment)(shaking-Possible sz?) Patient left: in bed;with call bell/phone within reach;with nursing/sitter in room   PT Visit Diagnosis: Muscle weakness (generalized) (M62.81);Difficulty in walking, not elsewhere classified (R26.2)    Time: 2924-4628 PT Time Calculation (min) (ACUTE ONLY): 13 min   Charges:   PT Evaluation $PT Eval Moderate Complexity: 1 Mod     PT G Codes:          Weston Anna, MPT Pager: [phone]

## 2017-11-30 NOTE — Progress Notes (Signed)
PROGRESS NOTE    [name]  JQB:[id number] DOB: 09-15-1945 DOA: 11/28/2017 PCP: Aura Dials, MD     Brief Narrative:   72 year old with past medical history relevant for depression, essential tremor, nonepileptic seizures, hypertension, hyperlipidemia, hypothyroidism admitted with hypoxia secondary to community-acquired pneumonia.   Assessment & Plan:   Principal Problem:   Atypical pneumonia Active Problems:   MDD (major depressive disorder), recurrent severe, without psychosis ([place])   Tremor   #) Community acquired pneumonia: Improving however patient continued to have oxygen requirement -Continue IV methylprednisolone -Continue ceftriaxone and azithromycin IV started 11/28/2017 -Scheduled bronchodilators -Wean oxygen as tolerated -Follow-up blood cultures from 11/28/2017  #) Hypertension: -Continue amlodipine 5 mg twice daily  #) Hyperlipidemia: -Continue atorvastatin 10 mg daily  #) Hypothyroidism: -Continue levothyroxine 50 mcg before breakfast  #) Essential tremor: -Continue atenolol 25 mg nightly  #) Psych: -Continue clonazepam 0.5 mg twice daily and 1 mg nightly -Continue citalopram 20 mg nightly  Fluids: Tolerating p.o. Electrolytes: Monitor and supplement Nutrition: Heart healthy diet  Prophylaxis: Enoxaparin  Disposition: Pending weaning to room air  Full code   Consultants:   None  Procedures: (Don't include imaging studies which can be auto populated. Include things that cannot be auto populated i.e. Echo, Carotid and venous dopplers, Foley, Bipap, HD, tubes/drains, wound vac, central lines etc)  None  Antimicrobials: (specify start and planned stop date. Auto populated tables are space occupying and do not give end dates)  IV ceftriaxone and azithromycin started 11/28/2017   Subjective: Patient reports that she has improved since admission.  She is still quite dyspneic and short of breath.  Her most troublesome symptom is her cough.   She denies any chest pain, nausea, vomiting, diarrhea.  Objective: Vitals:   11/29/17 1951 11/29/17 2029 11/30/17 0514 11/30/17 0759  BP:  133/65 119/74   Pulse:  73 60   Resp:  18 18   Temp:  98.1 F (36.7 C) 97.8 F (36.6 C)   TempSrc:  Oral Oral   SpO2: 92% 96% 93% (!) 86%  Weight:      Height:        Intake/Output Summary (Last 24 hours) at 11/30/2017 1042 Last data filed at 11/30/2017 4097 Gross per 24 hour  Intake 440 ml  Output 300 ml  Net 140 ml   Filed Weights   11/28/17 2054  Weight: 107 kg (236 lb)    Examination:  General exam: Appears calm and comfortable  Respiratory system: No increased work of breathing, scattered crackles throughout worse on right, diffuse inspiratory and expiratory wheezing worse on right Cardiovascular system: Distant heart sounds, no murmurs. Gastrointestinal system: Soft, nontender, nondistended Central nervous system: Alert and oriented. No focal neurological deficits. Extremities: Trace lower extremity edema Skin: No rashes on visible skin Psychiatry: Judgement and insight appear normal. Mood & affect appropriate.     Data Reviewed: I have personally reviewed following labs and imaging studies  CBC: Recent Labs  Lab 11/28/17 2223  WBC 7.2  NEUTROABS 5.2  HGB 14.8  HCT 45.8  MCV 100.0  PLT 353*   Basic Metabolic Panel: Recent Labs  Lab 11/28/17 2223  NA 140  K 4.1  CL 107  CO2 25  GLUCOSE 107*  BUN 20  CREATININE 0.85  CALCIUM 8.3*   GFR: Estimated Creatinine Clearance: 71.1 mL/min (by C-G formula based on SCr of 0.85 mg/dL). Liver Function Tests: Recent Labs  Lab 11/28/17 2223  AST 24  ALT 17  ALKPHOS 42  BILITOT 0.9  PROT 6.6  ALBUMIN 3.5   No results for input(s): LIPASE, AMYLASE in the last 168 hours. No results for input(s): AMMONIA in the last 168 hours. Coagulation Profile: No results for input(s): INR, PROTIME in the last 168 hours. Cardiac Enzymes: Recent Labs  Lab 11/28/17 2223    TROPONINI 0.04*   BNP (last 3 results) No results for input(s): PROBNP in the last 8760 hours. HbA1C: No results for input(s): HGBA1C in the last 72 hours. CBG: No results for input(s): GLUCAP in the last 168 hours. Lipid Profile: No results for input(s): CHOL, HDL, LDLCALC, TRIG, CHOLHDL, LDLDIRECT in the last 72 hours. Thyroid Function Tests: No results for input(s): TSH, T4TOTAL, FREET4, T3FREE, THYROIDAB in the last 72 hours. Anemia Panel: No results for input(s): VITAMINB12, FOLATE, FERRITIN, TIBC, IRON, RETICCTPCT in the last 72 hours. Sepsis Labs: Recent Labs  Lab 11/28/17 2223  LATICACIDVEN 0.61    No results found for this or any previous visit (from the past 240 hour(s)).       Radiology Studies: Dg Chest 2 View  Result Date: 11/29/2017 CLINICAL DATA:  72 y/o F; shortness of breath and cough for 3 weeks. EXAM: CHEST - 2 VIEW COMPARISON:  07/24/2015 chest radiograph FINDINGS: Stable cardiomegaly given projection and technique. Aortic atherosclerosis with calcification. Mild reticular opacities of the lungs. No consolidation. No pleural effusion or pneumothorax. No acute osseous abnormality is evident. Surgical clips project over the left axilla. IMPRESSION: Mild reticular opacities of the lungs may represent interstitial edema or atypical pneumonia. No consolidation. Stable cardiomegaly. Electronically Signed   By: Kristine Garbe M.D.   On: 11/29/2017 00:09        Scheduled Meds: . amLODipine  5 mg Oral BID  . atenolol  25 mg Oral QHS  . atorvastatin  10 mg Oral q1800  . clonazePAM  0.5 mg Oral BID  . clonazePAM  1 mg Oral QHS  . enoxaparin (LOVENOX) injection  40 mg Subcutaneous Q24H  . escitalopram  20 mg Oral QHS  . fluticasone  1 spray Each Nare QPM  . ipratropium-albuterol  3 mL Nebulization TID  . levothyroxine  50 mcg Oral QAC breakfast  . methylPREDNISolone (SOLU-MEDROL) injection  40 mg Intravenous Q6H  . montelukast  10 mg Oral QHS    Continuous Infusions: . azithromycin Stopped (11/29/17 2300)  . cefTRIAXone (ROCEPHIN)  IV Stopped (11/30/17 0347)     LOS: 0 days    Time spent: [place], MD [facility]   If 7PM-7AM, please contact night-coverage [email or web address] Password [facility] 11/30/2017, 10:42 AM

## 2017-12-01 ENCOUNTER — Inpatient Hospital Stay (HOSPITAL_COMMUNITY): Payer: Medicare Other

## 2017-12-01 ENCOUNTER — Encounter (HOSPITAL_COMMUNITY): Payer: Self-pay | Admitting: Radiology

## 2017-12-01 ENCOUNTER — Inpatient Hospital Stay
Admission: RE | Admit: 2017-12-01 | Discharge: 2017-12-01 | Disposition: A | Discharge status: Home / Self Care | Payer: Self-pay | Source: Ambulatory Visit | Attending: Surgery | Admitting: Surgery

## 2017-12-01 LAB — CBC
HCT: 44.1 % (ref 36.0–46.0)
Hemoglobin: 14.1 g/dL (ref 12.0–15.0)
MCH: 32 pg (ref 26.0–34.0)
MCHC: 32 g/dL (ref 30.0–36.0)
MCV: 100 fL (ref 78.0–100.0)
Platelets: 134 K/uL — ABNORMAL LOW (ref 150–400)
RBC: 4.41 MIL/uL (ref 3.87–5.11)
RDW: 14.6 % (ref 11.5–15.5)
WBC: 14.3 K/uL — ABNORMAL HIGH (ref 4.0–10.5)

## 2017-12-01 MED ORDER — IPRATROPIUM-ALBUTEROL 0.5-2.5 (3) MG/3ML IN SOLN
3.0000 mL | Freq: Two times a day (BID) | RESPIRATORY_TRACT | Status: DC
  Administered 2017-12-01 – 2017-12-02 (×3): 3 mL via RESPIRATORY_TRACT
  Filled 2017-12-01 (×3): qty 3

## 2017-12-01 MED ORDER — BENZONATATE 100 MG PO CAPS: 200.0000 mg | ORAL_CAPSULE | Freq: Three times a day (TID) | ORAL | Status: DC | PRN

## 2017-12-01 MED ORDER — IOHEXOL 300 MG/ML  SOLN
75.0000 mL | Freq: Once | INTRAMUSCULAR | Status: AC | PRN
  Administered 2017-12-01: 75 mL via INTRAVENOUS

## 2017-12-01 MED ORDER — IOPAMIDOL (ISOVUE-300) INJECTION 61%
INTRAVENOUS | Status: AC
  Filled 2017-12-01: qty 30

## 2017-12-01 MED ORDER — HYDROCODONE-HOMATROPINE 5-1.5 MG/5ML PO SYRP
5.0000 mL | ORAL_SOLUTION | ORAL | Status: DC | PRN
  Administered 2017-12-01 – 2017-12-04 (×8): 5 mL via ORAL
  Filled 2017-12-01 (×8): qty 5

## 2017-12-01 MED ORDER — BENZONATATE 100 MG PO CAPS
200.0000 mg | ORAL_CAPSULE | Freq: Three times a day (TID) | ORAL | Status: DC | PRN
  Administered 2017-12-01 – 2017-12-02 (×3): 200 mg via ORAL
  Filled 2017-12-01 (×3): qty 2

## 2017-12-01 MED ORDER — PREDNISONE 20 MG PO TABS
40.0000 mg | ORAL_TABLET | Freq: Every day | ORAL | Status: DC
  Administered 2017-12-02 – 2017-12-04 (×3): 40 mg via ORAL
  Filled 2017-12-01 (×3): qty 2

## 2017-12-01 NOTE — Progress Notes (Signed)
PROGRESS NOTE    [name]  [id number]:[id number] DOB: 1946/05/09 DOA: 11/28/2017 PCP: Henrine Screws, MD     Brief Narrative:   72 year old with past medical history relevant for depression, essential tremor, nonepileptic seizures, hypertension, hyperlipidemia, hypothyroidism admitted with hypoxia secondary to community-acquired pneumonia.   Assessment & Plan:   Principal Problem:   Atypical pneumonia Active Problems:   MDD (major depressive disorder), recurrent severe, without psychosis ([facility])   Tremor   #) Community acquired pneumonia: Not improving dramatically. -CTA ordered -Transition IV methylprednisolone to oral prednisone 40 mg daily -Continue ceftriaxone and azithromycin IV started 11/28/2017 -Scheduled bronchodilators -Wean oxygen as tolerated - blood cultures from 11/28/2017 no growth to date  #) Hypertension: -Continue amlodipine 5 mg twice daily  #) Hyperlipidemia: -Continue atorvastatin 10 mg daily  #) Hypothyroidism: -Continue levothyroxine 50 mcg before breakfast  #) Essential tremor: -Continue atenolol 25 mg nightly  #) Psych: -Continue clonazepam 0.5 mg twice daily and 1 mg nightly -Continue citalopram 20 mg nightly  Fluids: Tolerating p.o. Electrolytes: Monitor and supplement Nutrition: Heart healthy diet  Prophylaxis: Enoxaparin  Disposition: Per PT patient might need skilled nursing facility at discharge  Full code   Consultants:   None  Procedures: (Don't include imaging studies which can be auto populated. Include things that cannot be auto populated i.e. Echo, Carotid and venous dopplers, Foley, Bipap, HD, tubes/drains, wound vac, central lines etc)  None  Antimicrobials: (specify start and planned stop date. Auto populated tables are space occupying and do not give end dates)  IV ceftriaxone and azithromycin started 11/28/2017   Subjective: Patient reports that her improvement is continue to plateau.  She continues to report  significant cough and continues to have a 2 L oxygen requirement.  She is otherwise quite fatigued and debilitated from this illness.  Objective: Vitals:   11/30/17 2112 11/30/17 2143 12/01/17 0544 12/01/17 0819  BP: 140/89  (!) 146/84   Pulse: 64  65   Resp: 19  18   Temp: 99.2 F (37.3 C)  97.8 F (36.6 C)   TempSrc: Oral  Oral   SpO2: 93% (!) 87% 96% 93%  Weight:      Height:        Intake/Output Summary (Last 24 hours) at 12/01/2017 1135 Last data filed at 12/01/2017 6440 Gross per 24 hour  Intake 1140 ml  Output 600 ml  Net 540 ml   Filed Weights   11/28/17 2054  Weight: 107 kg (236 lb)    Examination:  General exam: Appears calm and comfortable  Respiratory system: Mild increased work of breathing, scattered rhonchi throughout worse on right, mild expiratory wheezes, no crackles Cardiovascular system: Distant heart sounds, no murmurs. Gastrointestinal system: Soft, nontender, nondistended Central nervous system: Alert and oriented. No focal neurological deficits, tremor noted in bilateral hands and chin Extremities: Trace lower extremity edema Skin: No rashes on visible skin Psychiatry: Judgement and insight appear normal. Mood & affect appropriate.     Data Reviewed: I have personally reviewed following labs and imaging studies  CBC: Recent Labs  Lab 11/28/17 2223 12/01/17 0516  WBC 7.2 14.3*  NEUTROABS 5.2  --   HGB 14.8 14.1  HCT 45.8 44.1  MCV 100.0 100.0  PLT 129* 134*   Basic Metabolic Panel: Recent Labs  Lab 11/28/17 2223  NA 140  K 4.1  CL 107  CO2 25  GLUCOSE 107*  BUN 20  CREATININE 0.85  CALCIUM 8.3*   GFR: Estimated Creatinine Clearance: 71.1 mL/min (  by C-G formula based on SCr of 0.85 mg/dL). Liver Function Tests: Recent Labs  Lab 11/28/17 2223  AST 24  ALT 17  ALKPHOS 42  BILITOT 0.9  PROT 6.6  ALBUMIN 3.5   No results for input(s): LIPASE, AMYLASE in the last 168 hours. No results for input(s): AMMONIA in the last 168  hours. Coagulation Profile: No results for input(s): INR, PROTIME in the last 168 hours. Cardiac Enzymes: Recent Labs  Lab 11/28/17 2223  TROPONINI 0.04*   BNP (last 3 results) No results for input(s): PROBNP in the last 8760 hours. HbA1C: No results for input(s): HGBA1C in the last 72 hours. CBG: No results for input(s): GLUCAP in the last 168 hours. Lipid Profile: No results for input(s): CHOL, HDL, LDLCALC, TRIG, CHOLHDL, LDLDIRECT in the last 72 hours. Thyroid Function Tests: No results for input(s): TSH, T4TOTAL, FREET4, T3FREE, THYROIDAB in the last 72 hours. Anemia Panel: No results for input(s): VITAMINB12, FOLATE, FERRITIN, TIBC, IRON, RETICCTPCT in the last 72 hours. Sepsis Labs: Recent Labs  Lab 11/28/17 2223  LATICACIDVEN 0.61    Recent Results (from the past 240 hour(s))  Blood culture (routine x 2)     Status: None (Preliminary result)   Collection Time: 11/28/17 10:23 PM  Result Value Ref Range Status   Specimen Description   Final    BLOOD LEFT HAND Performed at [facility], [address] [place], [place] [address]    Special Requests   Final    BOTTLES DRAWN AEROBIC AND ANAEROBIC Blood Culture adequate volume Performed at [facility], [address], [place], [place] [address]    Culture   Final    NO GROWTH 1 DAY Performed at [facility], [address] [place], [place] [address]    Report Status PENDING  Incomplete  Blood culture (routine x 2)     Status: None (Preliminary result)   Collection Time: 11/28/17 10:23 PM  Result Value Ref Range Status   Specimen Description   Final    BLOOD RIGHT ANTECUBITAL Performed at [facility], [address], [place], [place] [address]    Special Requests   Final    BOTTLES DRAWN AEROBIC ONLY Blood Culture adequate volume Performed at [facility], [address] [place], [place] [address]    Culture   Final    NO  GROWTH 1 DAY Performed at [facility], [address] [place], [place] [address]    Report Status PENDING  Incomplete         Radiology Studies: No results found.      Scheduled Meds: . amLODipine  5 mg Oral BID  . atenolol  25 mg Oral QHS  . atorvastatin  10 mg Oral q1800  . clonazePAM  0.5 mg Oral BID  . clonazePAM  1 mg Oral QHS  . enoxaparin (LOVENOX) injection  40 mg Subcutaneous Q24H  . escitalopram  20 mg Oral QHS  . fluticasone  1 spray Each Nare QPM  . iopamidol      . ipratropium-albuterol  3 mL Nebulization BID  . levothyroxine  50 mcg Oral QAC breakfast  . methylPREDNISolone (SOLU-MEDROL) injection  40 mg Intravenous Q6H  . montelukast  10 mg Oral QHS   Continuous Infusions: . azithromycin Stopped (12/01/17 0000)  . cefTRIAXone (ROCEPHIN)  IV Stopped (11/30/17 2251)     LOS: 1 day    Time spent: 30    Daelyn Mozer C  Charnita Trudel, MD [facility]   If 7PM-7AM, please contact night-coverage [email or web address] Password [facility] 12/01/2017, 11:35 AM

## 2017-12-01 NOTE — Progress Notes (Signed)
OT Cancellation Note  Patient Details Name: [name] MRN: [id number] DOB: 19-Jun-1946   Cancelled Treatment:    Reason Eval/Treat Not Completed: Patient at procedure or test/ unavailable  Will check back on pt next day Kari Baars, [place]  Payton Mccallum D 12/01/2017, 1:57 PM

## 2017-12-02 LAB — MAGNESIUM: Magnesium: 2.2 mg/dL (ref 1.7–2.4)

## 2017-12-02 LAB — CBC
HCT: 44.3 % (ref 36.0–46.0)
Hemoglobin: 13.8 g/dL (ref 12.0–15.0)
MCH: 31.4 pg (ref 26.0–34.0)
MCHC: 31.2 g/dL (ref 30.0–36.0)
MCV: 100.7 fL — ABNORMAL HIGH (ref 78.0–100.0)
Platelets: 136 K/uL — ABNORMAL LOW (ref 150–400)
RBC: 4.4 MIL/uL (ref 3.87–5.11)
RDW: 14.8 % (ref 11.5–15.5)
WBC: 12.3 10*3/uL — ABNORMAL HIGH (ref 4.0–10.5)

## 2017-12-02 LAB — BASIC METABOLIC PANEL WITH GFR
BUN: 32 mg/dL — ABNORMAL HIGH (ref 6–20)
Calcium: 8.5 mg/dL — ABNORMAL LOW (ref 8.9–10.3)
GFR calc non Af Amer: 56 mL/min — ABNORMAL LOW (ref >60)
Glucose, Bld: 130 mg/dL — ABNORMAL HIGH (ref 65–99)
Potassium: 4.1 mmol/L (ref 3.5–5.1)
Sodium: 141 mmol/L (ref 135–145)

## 2017-12-02 LAB — BASIC METABOLIC PANEL
Anion gap: 8 (ref 5–15)
CO2: 26 mmol/L (ref 22–32)
Chloride: 107 mmol/L (ref 101–111)
Creatinine, Ser: 0.99 mg/dL (ref 0.44–1.00)
GFR calc Af Amer: >60 mL/min (ref >60)

## 2017-12-02 LAB — STREP PNEUMONIAE URINARY ANTIGEN: STREP PNEUMO URINARY ANTIGEN: NEGATIVE

## 2017-12-02 MED ORDER — IPRATROPIUM-ALBUTEROL 0.5-2.5 (3) MG/3ML IN SOLN
3.0000 mL | Freq: Three times a day (TID) | RESPIRATORY_TRACT | Status: DC
  Administered 2017-12-03 – 2017-12-04 (×5): 3 mL via RESPIRATORY_TRACT
  Filled 2017-12-02 (×6): qty 3

## 2017-12-02 MED ORDER — ALBUTEROL SULFATE (2.5 MG/3ML) 0.083% IN NEBU
2.5000 mg | INHALATION_SOLUTION | RESPIRATORY_TRACT | Status: DC | PRN
  Filled 2017-12-02: qty 3

## 2017-12-02 NOTE — Progress Notes (Signed)
LCSW attempted to meet with patient at bedside. Patient was asleep and would not arise to LCSW voice or knock.   LCSW attempted to reach patients daughter, [name] by phone. LCSW left message for patient's daughter.    LCSW will continue to follow.   Carolin Coy [place]

## 2017-12-02 NOTE — Evaluation (Signed)
Occupational Therapy Evaluation Patient Details Name: [name] MRN: [id number] DOB: 07-Jan-1946 Today's Date: 12/02/2017    History of Present Illness 72 yo female admitted with atypical Pna. Hx tremores, seizures, pseudoseizures, MDD, anxiety, vertigo, breast cancer, anxiety   Clinical Impression   Pt was admitted for the above. At baseline, she is mod I.  Pt currently needs mostly min guard for adls except she needs more assistance for LB dressing. She lives alone with little support. She will benefit from continued OT in acute as well as SNF to regain her PLOF.    Follow Up Recommendations  SNF    Equipment Recommendations  None recommended by OT    Recommendations for Other Services       Precautions / Restrictions Precautions Precautions: Fall Precaution Comments: sz Restrictions Weight Bearing Restrictions: No      Mobility Bed Mobility         Supine to sit: Min guard;HOB elevated        Transfers Overall transfer level: Needs assistance Equipment used: Rolling walker (2 wheeled) Transfers: Sit to/from [employer] Sit to Stand: Min guard Stand pivot transfers: Min guard       General transfer comment: for safety. Pt has tremors but no LOB today    Balance                                           ADL either performed or assessed with clinical judgement   ADL Overall ADL's : Needs assistance/impaired     Grooming: Wash/dry hands;Standing;Min guard   Upper Body Bathing: Set up;Sitting   Lower Body Bathing: Minimal assistance;Sit to/from stand   Upper Body Dressing : Set up;Sitting   Lower Body Dressing: Minimal assistance;Sit to/from stand;Moderate assistance   Toilet Transfer: Min guard;Stand-pivot;BSC;RW   Toileting- Clothing Manipulation and Hygiene: Minimal assistance;Sit to/from stand.  Assist for gowns during hygiene          General ADL Comments: pt shaky but no LOB. Ambulated in hall after  using commode.  Pt states she can feel when she will have "pseudo seizure" and sits when this happens     Vision         Perception     Praxis      Pertinent Vitals/Pain Pain Assessment: No/denies pain     Hand Dominance Right   Extremity/Trunk Assessment Upper Extremity Assessment Upper Extremity Assessment: Generalized weakness           Communication Communication Communication: No difficulties   Cognition Arousal/Alertness: Awake/alert Behavior During Therapy: WFL for tasks assessed/performed Overall Cognitive Status: Within Functional Limits for tasks assessed                                     General Comments  sats dropped to 85% on RA; HR in 40's to 50s    Exercises     Shoulder Instructions      Home Living Family/patient expects to be discharged to:: Unsure Living Arrangements: Alone                           Home Equipment: Dan Humphreys - 2 wheels;Cane - single point;Walker - 4 wheels;Bedside commode   Additional Comments: has a tub/shower and low commode. She has a grab  bar by toilet      Prior Functioning/Environment Level of Independence: Independent        Comments: pt states she still drives        OT Problem List: Decreased strength;Decreased activity tolerance;Impaired balance (sitting and/or standing);Decreased knowledge of use of DME or AE;Cardiopulmonary status limiting activity      OT Treatment/Interventions: Self-care/ADL training;Energy conservation;DME and/or AE instruction;Patient/family education;Balance training    OT Goals(Current goals can be found in the care plan section) Acute Rehab OT Goals Patient Stated Goal: get strength back, be able to walk OT Goal Formulation: With patient Time For Goal Achievement: 12/16/17 Potential to Achieve Goals: Good ADL Goals Pt Will Perform Lower Body Bathing: with supervision;with adaptive equipment;sit to/from stand Pt Will Perform Lower Body Dressing:  with supervision;sit to/from stand;with adaptive equipment Pt Will Transfer to Toilet: with supervision;ambulating;bedside commode Pt Will Perform Toileting - Clothing Manipulation and hygiene: with supervision;sit to/from stand Pt Will Perform Tub/Shower Transfer: Tub transfer;shower seat;tub bench;with min guard assist(vs) Additional ADL Goal #1: pt will initiate at least one rest break for energy conservation during adls  OT Frequency: Min 2X/week   Barriers to D/C:            Co-evaluation              AM-PAC PT "6 Clicks" Daily Activity     Outcome Measure Help from another person eating meals?: None Help from another person taking care of personal grooming?: A Little Help from another person toileting, which includes using toliet, bedpan, or urinal?: A Little Help from another person bathing (including washing, rinsing, drying)?: A Little Help from another person to put on and taking off regular upper body clothing?: A Little Help from another person to put on and taking off regular lower body clothing?: A Little 6 Click Score: 19   End of Session Nurse Communication: (purewick; incontinence at baseline)  Activity Tolerance: Patient tolerated treatment well Patient left: in chair;with call bell/phone within reach;with chair alarm set  OT Visit Diagnosis: Muscle weakness (generalized) (M62.81)                Time: 4540-9811 OT Time Calculation (min): 24 min Charges:  OT General Charges $OT Visit: 1 Visit OT Evaluation $OT Eval Low Complexity: 1 Low G-Codes:     [place], OTR/L [phone] 12/02/2017  [name] 12/02/2017, 11:13 AM

## 2017-12-02 NOTE — Progress Notes (Signed)
PROGRESS NOTE    [name]  NTI:[id number] DOB: 23-Feb-1946 DOA: 11/28/2017 PCP: Aura Dials, MD     Brief Narrative:   72 year old with past medical history relevant for depression, essential tremor, nonepileptic seizures, hypertension, hyperlipidemia, hypothyroidism admitted with hypoxia secondary to community-acquired pneumonia.   Assessment & Plan:   Principal Problem:   Atypical pneumonia Active Problems:   MDD (major depressive disorder), recurrent severe, without psychosis ([place])   Tremor   #) Community acquired pneumonia: Not improving dramatically. -CTA showed no evidence of pneumonia and only reactive changes -Continue oral prednisone 40 mg daily -Continue ceftriaxone and azithromycin IV started 11/28/2017, will finish last dose today -Scheduled bronchodilators -Wean oxygen  - blood cultures from 11/28/2017 no growth to date  #) Cough: This is the most bothersome symptom.  Suspect this is almost certainly related to severe either viral pneumonitis or simply a viral upper respiratory tract infection. -Continue PRN Tessalon Perles and Robitussin-DM -PRN Hycodan  #) Hypertension: -Continue amlodipine 5 mg twice daily  #) Hyperlipidemia: -Continue atorvastatin 10 mg daily  #) Hypothyroidism: -Continue levothyroxine 50 mcg before breakfast  #) Essential tremor: -Continue atenolol 25 mg nightly  #) Psych: -Continue clonazepam 0.5 mg twice daily and 1 mg nightly -Continue citalopram 20 mg nightly  Fluids: Tolerating p.o. Electrolytes: Monitor and supplement Nutrition: Heart healthy diet  Prophylaxis: Enoxaparin  Disposition: Per PT patient might need skilled nursing facility at discharge  Full code   Consultants:   None  Procedures: (Don't include imaging studies which can be auto populated. Include things that cannot be auto populated i.e. Echo, Carotid and venous dopplers, Foley, Bipap, HD, tubes/drains, wound vac, central lines  etc)  None  Antimicrobials: (specify start and planned stop date. Auto populated tables are space occupying and do not give end dates)  IV ceftriaxone and azithromycin started 11/28/2017   Subjective: Patient reports that she is feeling better but slowly.  She reports the cough continues to be up with bothersome symptom.  She reports that the cough medication prescribed yesterday helped, likely referring to the Hycodan.  She otherwise denies any nausea, vomiting, abdominal pain.  Objective: Vitals:   12/01/17 1911 12/01/17 2016 12/02/17 0514 12/02/17 0724  BP:  140/88 135/84   Pulse:  61 (!) 47   Resp:  18 16   Temp:  98.1 F (36.7 C) 98.1 F (36.7 C)   TempSrc:  Oral Oral   SpO2: 93% 92% 92% 92%  Weight:      Height:        Intake/Output Summary (Last 24 hours) at 12/02/2017 1105 Last data filed at 12/02/2017 0900 Gross per 24 hour  Intake 720 ml  Output 600 ml  Net 120 ml   Filed Weights   11/28/17 2054  Weight: 107 kg (236 lb)    Examination:  General exam: Appears calm and comfortable  Respiratory system: No increased work of breathing, intermittent expiratory wheezes on bilateral lungs, no crackles or rhonchi Cardiovascular system: Distant heart sounds, no murmurs. Gastrointestinal system: Soft, nontender, nondistended Central nervous system: Alert and oriented. No focal neurological deficits, tremor noted in bilateral hands and chin Extremities: Trace lower extremity edema Skin: No rashes on visible skin Psychiatry: Judgement and insight appear normal. Mood & affect appropriate.     Data Reviewed: I have personally reviewed following labs and imaging studies  CBC: Recent Labs  Lab 11/28/17 2223 12/01/17 0516 12/02/17 0600  WBC 7.2 14.3* 12.3*  NEUTROABS 5.2  --   --  HGB 14.8 14.1 13.8  HCT 45.8 44.1 44.3  MCV 100.0 100.0 100.7*  PLT 129* 134* 737*   Basic Metabolic Panel: Recent Labs  Lab 11/28/17 2223 12/02/17 0600  NA 140 141  K 4.1 4.1   CL 107 107  CO2 25 26  GLUCOSE 107* 130*  BUN 20 32*  CREATININE 0.85 0.99  CALCIUM 8.3* 8.5*  MG  --  2.2   GFR: Estimated Creatinine Clearance: 61.1 mL/min (by C-G formula based on SCr of 0.99 mg/dL). Liver Function Tests: Recent Labs  Lab 11/28/17 2223  AST 24  ALT 17  ALKPHOS 42  BILITOT 0.9  PROT 6.6  ALBUMIN 3.5   No results for input(s): LIPASE, AMYLASE in the last 168 hours. No results for input(s): AMMONIA in the last 168 hours. Coagulation Profile: No results for input(s): INR, PROTIME in the last 168 hours. Cardiac Enzymes: Recent Labs  Lab 11/28/17 2223  TROPONINI 0.04*   BNP (last 3 results) No results for input(s): PROBNP in the last 8760 hours. HbA1C: No results for input(s): HGBA1C in the last 72 hours. CBG: No results for input(s): GLUCAP in the last 168 hours. Lipid Profile: No results for input(s): CHOL, HDL, LDLCALC, TRIG, CHOLHDL, LDLDIRECT in the last 72 hours. Thyroid Function Tests: No results for input(s): TSH, T4TOTAL, FREET4, T3FREE, THYROIDAB in the last 72 hours. Anemia Panel: No results for input(s): VITAMINB12, FOLATE, FERRITIN, TIBC, IRON, RETICCTPCT in the last 72 hours. Sepsis Labs: Recent Labs  Lab 11/28/17 2223  LATICACIDVEN 0.61    Recent Results (from the past 240 hour(s))  Blood culture (routine x 2)     Status: None (Preliminary result)   Collection Time: 11/28/17 10:23 PM  Result Value Ref Range Status   Specimen Description   Final    BLOOD LEFT HAND Performed at [facility], [place] [address] [place], [place] [address]    Special Requests   Final    BOTTLES DRAWN AEROBIC AND ANAEROBIC Blood Culture adequate volume Performed at [facility] [address], [place], [place] [address]    Culture   Final    NO GROWTH 2 DAYS Performed at [facility] [address], [place], [place] [address]    Report Status PENDING  Incomplete  Blood culture (routine x 2)      Status: None (Preliminary result)   Collection Time: 11/28/17 10:23 PM  Result Value Ref Range Status   Specimen Description   Final    BLOOD RIGHT ANTECUBITAL Performed at [facility] [address] [place], [place] [address]    Special Requests   Final    BOTTLES DRAWN AEROBIC ONLY Blood Culture adequate volume Performed at [facility] [address], [place], [place] [address]    Culture   Final    NO GROWTH 2 DAYS Performed at [facility] [address] [place],  [address]    Report Status PENDING  Incomplete         Radiology Studies: Ct Chest W Contrast  Result Date: 12/01/2017 CLINICAL DATA:  Acute respiratory illness EXAM: CT CHEST WITH CONTRAST TECHNIQUE: Multidetector CT imaging of the chest was performed during intravenous contrast administration. CONTRAST:  48mL OMNIPAQUE IOHEXOL 300 MG/ML  SOLN COMPARISON:  None. FINDINGS: Cardiovascular: Normal heart size. No pericardial effusion. Ascending thoracic aortic aneurysm measuring 4 cm in diameter. Thoracic aortic atherosclerosis. Coronary artery atherosclerosis in the LAD. Mediastinum/Nodes: No enlarged mediastinal, hilar, or  axillary lymph nodes. Thyroid gland, trachea, and esophagus demonstrate no significant findings. Lungs/Pleura: No focal consolidation. Mild subpleural reticular thickening. No pleural effusion or pneumothorax. Upper Abdomen: No acute upper abdominal abnormality. Musculoskeletal: No chest wall abnormality. No acute or significant osseous findings. IMPRESSION: 1. No acute cardiopulmonary disease. 2. Aortic Atherosclerosis (ICD10-I70.0). 3. Ascending thoracic aortic aneurysm measuring 4 cm in diameter. Recommend semi-annual imaging followup by CTA or MRA and referral to cardiothoracic surgery if not already obtained. This recommendation follows 2010 ACCF/AHA/AATS/ACR/ASA/SCA/SCAI/SIR/STS/SVM Guidelines for the Diagnosis and Management of Patients With  Thoracic Aortic Disease. Circulation. 2010; 121: e266-e36 4. Distal descending thoracic aorta measures 4.8 x 4 cm. Electronically Signed   By: Kathreen Devoid   On: 12/01/2017 14:15        Scheduled Meds: . amLODipine  5 mg Oral BID  . atenolol  25 mg Oral QHS  . atorvastatin  10 mg Oral q1800  . clonazePAM  0.5 mg Oral BID  . clonazePAM  1 mg Oral QHS  . enoxaparin (LOVENOX) injection  40 mg Subcutaneous Q24H  . escitalopram  20 mg Oral QHS  . fluticasone  1 spray Each Nare QPM  . ipratropium-albuterol  3 mL Nebulization BID  . levothyroxine  50 mcg Oral QAC breakfast  . montelukast  10 mg Oral QHS  . predniSONE  40 mg Oral Q breakfast   Continuous Infusions: . azithromycin Stopped (12/01/17 2355)  . cefTRIAXone (ROCEPHIN)  IV Stopped (12/01/17 2254)     LOS: 2 days    Time spent: [place], MD [facility]   If 7PM-7AM, please contact night-coverage [email or web address] Password [email or web address] 12/02/2017, 11:05 AM

## 2017-12-02 NOTE — Progress Notes (Signed)
Physical Therapy Treatment Patient Details Name: [name] MRN: [id number] DOB: 04/05/1946 Today's Date: 12/02/2017    History of Present Illness 72 yo female admitted with atypical Pna. Hx tremores, seizures, pseudoseizures, MDD, anxiety, vertigo, breast cancer, anxiety, L wrist ORIF    PT Comments    The patient  Ambulated x 60' x 2 with RW and 40' with min  Assist without RW with gait being unsteady without RW. Oxygent saturation on RA 86%. Replaced to 1 liter [place] and rest with return to 94%. Noted wheezing. Continue PT.   Follow Up Recommendations  SNF     Equipment Recommendations  None recommended by PT    Recommendations for Other Services       Precautions / Restrictions Precautions Precautions: Fall Precaution Comments: sz Restrictions Weight Bearing Restrictions: No    Mobility  Bed Mobility Overal bed mobility: Needs Assistance       Supine to sit: Min guard;HOB elevated        Transfers Overall transfer level: Needs assistance Equipment used: Rolling walker (2 wheeled) Transfers: Sit to/from [facility] Sit to Stand: Min guard Stand pivot transfers: Min guard       General transfer comment: for safety. Pt has tremors but no LOB today  Ambulation/Gait Ambulation/Gait assistance: Min assist Ambulation Distance (Feet): 60 Feet(x 2) Assistive device: Rolling walker (2 wheeled) Gait Pattern/deviations: Step-through pattern     General Gait Details: rest break for checking sats and pursed lip breaths.   Stairs            Wheelchair Mobility    Modified Rankin (Stroke Patients Only)       Balance                                            Cognition Arousal/Alertness: Awake/alert Behavior During Therapy: WFL for tasks assessed/performed Overall Cognitive Status: Within Functional Limits for tasks assessed                                        Exercises      General  Comments General comments (skin integrity, edema, etc.): sats dropped to 85% on RA; HR in 40's to 50s      Pertinent Vitals/Pain Pain Assessment: No/denies pain    Home Living Family/patient expects to be discharged to:: Unsure Living Arrangements: Alone           Home Equipment: Gilford Rile - 2 wheels;Cane - single point;Walker - 4 wheels;Bedside commode Additional Comments: has a tub/shower and low commode. She has a grab bar by toilet    Prior Function Level of Independence: Independent      Comments: pt states she still drives   PT Goals (current goals can now be found in the care plan section) Acute Rehab PT Goals Patient Stated Goal: get strength back, be able to walk Progress towards PT goals: Progressing toward goals    Frequency    Min 3X/week      PT Plan Current plan remains appropriate    Co-evaluation PT/OT/SLP Co-Evaluation/Treatment: Yes Reason for Co-Treatment: For patient/therapist safety PT goals addressed during session: Mobility/safety with mobility OT goals addressed during session: ADL's and self-care      AM-PAC PT "6 Clicks" Daily Activity  Outcome Measure  Difficulty turning over  in bed (including adjusting bedclothes, sheets and blankets)?: A Little Difficulty moving from lying on back to sitting on the side of the bed? : A Little Difficulty sitting down on and standing up from a chair with arms (e.g., wheelchair, bedside commode, etc,.)?: A Little Help needed moving to and from a bed to chair (including a wheelchair)?: A Lot Help needed walking in hospital room?: A Lot Help needed climbing 3-5 steps with a railing? : A Lot 6 Click Score: 15    End of Session   Activity Tolerance: Patient tolerated treatment well Patient left: in chair;with call bell/phone within reach;with chair alarm set Nurse Communication: Mobility status PT Visit Diagnosis: Muscle weakness (generalized) (M62.81);Difficulty in walking, not elsewhere classified  (R26.2)     Time: 6144-3154 PT Time Calculation (min) (ACUTE ONLY): 22 min  Charges:  $Gait Training: 8-22 mins                    G CodesTresa Endo PT [phone]   Claretha Cooper 12/02/2017, 1:31 PM

## 2017-12-03 NOTE — Clinical Social Work Note (Signed)
Clinical Social Work Assessment  Patient Details  Name: [name] MRN: [id number] Date of Birth: [birth year]  Date of referral:  12/03/17               Reason for consult:  Facility Placement                Permission sought to share information with:  Case Manager, Chartered certified accountant granted to share information::  Yes, Verbal Permission Granted  Name::        Agency::  SNF  Relationship::     Contact Information:     Housing/Transportation Living arrangements for the past 2 months:  Single Family Home Source of Information:  Patient Patient Interpreter Needed:  None Criminal Activity/Legal Involvement Pertinent to Current Situation/Hospitalization:  No - Comment as needed Significant Relationships:  Adult Children Lives with:  Self Do you feel safe going back to the place where you live?  Yes Need for family participation in patient care:  Yes (Comment)  Care giving concerns:  Patient lives alone. Patient reports that she does not feel like she can care for herself at this time.    Social Worker assessment / plan:  LCSW following for SNF placement.  Patient admitted for Atypical pneumonia.   LCSW met with patient at bedside. No family present. LCSW explained role and reason for visit.   Patient reports that she lives alone. Patient states that at baseline she is independent in all ADLs. Patient states she has a cane at home but does not use to ambulate. Patient  Reports that she still drives.   Patient is agreeable to SNF at dc. Patient reports that she went to SNF years ago when she had a blood infection.   PLAN: Patient will go to SNF at dc.   Employment status:  Retired Forensic scientist:  Medicare PT Recommendations:  [place] / Referral to community resources:     Patient/Family's Response to care:  Patient is thankful for LCSW visit.   Patient/Family's Understanding of and Emotional Response to  Diagnosis, Current Treatment, and Prognosis:  Patient is understanding of current diagnosis and agreeable to SNF at dc. Patient states that she is not sure if she will be able to prepare her own meals right now and she has a lot of cleaning to do at home that she can't do right now.   Emotional Assessment Appearance:  Appears stated age Attitude/Demeanor/Rapport:    Affect (typically observed):  Accepting, Calm Orientation:  Oriented to Self, Oriented to Place, Oriented to  Time, Oriented to Situation Alcohol / Substance use:  Not Applicable Psych involvement (Current and /or in the community):  No (Comment)  Discharge Needs  Concerns to be addressed:  No discharge needs identified Readmission within the last 30 days:  No Current discharge risk:  None Barriers to Discharge:  Continued Medical Work up   [employer], LCSW 12/03/2017, 9:20 AM

## 2017-12-03 NOTE — Progress Notes (Signed)
Occupational Therapy Treatment Patient Details Name: [name] MRN: [id number] DOB: 1945/12/05 Today's Date: 12/03/2017    History of present illness 72 yo female admitted with atypical Pna. Hx tremores, seizures, pseudoseizures, MDD, anxiety, vertigo, breast cancer, anxiety, L wrist ORIF   OT comments  Pt making good progress with OT.  On 2 liters 02 today  Follow Up Recommendations  SNF    Equipment Recommendations  None recommended by OT    Recommendations for Other Services      Precautions / Restrictions Precautions Precautions: Fall Precaution Comments: sz Restrictions Weight Bearing Restrictions: No       Mobility Bed Mobility                  Transfers   Equipment used: Rolling walker (2 wheeled)   Sit to Stand: Min guard Stand pivot transfers: Min guard       General transfer comment: for safety    Balance                                           ADL either performed or assessed with clinical judgement   ADL       Grooming: Oral care;Supervision/safety;Standing               Lower Body Dressing: Min guard;Sit to/from stand;With adaptive equipment   Toilet Transfer: Min guard;Stand-pivot;BSC;RW             General ADL Comments: educated on and used AE.  SPT to commode and stood at sink for grooming     Vision       Perception     Praxis      Cognition Arousal/Alertness: Awake/alert Behavior During Therapy: WFL for tasks assessed/performed Overall Cognitive Status: Within Functional Limits for tasks assessed                                          Exercises     Shoulder Instructions       General Comments      Pertinent Vitals/ Pain       Pain Assessment: No/denies pain  Home Living                                          Prior Functioning/Environment              Frequency  Min 2X/week        Progress Toward Goals  OT  Goals(current goals can now be found in the care plan section)        Plan      Co-evaluation                 AM-PAC PT "6 Clicks" Daily Activity     Outcome Measure   Help from another person eating meals?: None Help from another person taking care of personal grooming?: A Little Help from another person toileting, which includes using toliet, bedpan, or urinal?: A Little Help from another person bathing (including washing, rinsing, drying)?: A Little Help from another person to put on and taking off regular upper body clothing?: A Little Help from another person to put on and taking off  regular lower body clothing?: A Little 6 Click Score: 19    End of Session    OT Visit Diagnosis: Muscle weakness (generalized) (M62.81)   Activity Tolerance Patient tolerated treatment well   Patient Left in chair;with call bell/phone within reach;with chair alarm set   Nurse Communication          Time: 1610-9604 OT Time Calculation (min): 21 min  Charges: OT General Charges $OT Visit: 1 Visit OT Treatments $Self Care/Home Management : 8-22 mins  Marica Otter, OTR/L [phone] 12/03/2017   [name] 12/03/2017, 10:43 AM

## 2017-12-03 NOTE — Progress Notes (Signed)
PROGRESS NOTE    [name]  CZY:[id number] DOB: [birth year] DOA: 11/28/2017 PCP: Aura Dials, MD     Brief Narrative:   72 year old with past medical history relevant for depression, essential tremor, nonepileptic seizures, hypertension, hyperlipidemia, hypothyroidism admitted with hypoxia secondary to likely viral exacerbation wheezing.   Assessment & Plan:   Principal Problem:   Atypical pneumonia Active Problems:   MDD (major depressive disorder), recurrent severe, without psychosis ([place])   Tremor   #) Viral pneumonia with wheezing: Patient most likely had viral pneumonia.  She completed a course of antibiotics for community-acquired pneumonia with no significant improvement however addition of steroids that appear to improve her symptoms. -CTA showed no evidence of pneumonia and only reactive changes -Continue oral prednisone 40 mg daily -Completed ceftriaxone and azithromycin IV started 11/28/2017 to 12/03/2017 -Scheduled bronchodilators -Wean oxygen  - blood cultures from 11/28/2017 negative  #) Cough: -Continue PRN Tessalon Perles and Robitussin-DM -PRN Hycodan  #) Hypertension: -Continue amlodipine 5 mg twice daily  #) Hyperlipidemia: -Continue atorvastatin 10 mg daily  #) Hypothyroidism: -Continue levothyroxine 50 mcg before breakfast  #) Essential tremor: -Continue atenolol 25 mg nightly  #) Psych: -Continue clonazepam 0.5 mg twice daily and 1 mg nightly -Continue citalopram 20 mg nightly  Fluids: Tolerating p.o. Electrolytes: Monitor and supplement Nutrition: Heart healthy diet  Prophylaxis: Enoxaparin  Disposition: Pending skilled nursing facility placement  Full code   Consultants:   None  Procedures: (Don't include imaging studies which can be auto populated. Include things that cannot be auto populated i.e. Echo, Carotid and venous dopplers, Foley, Bipap, HD, tubes/drains, wound vac, central lines etc)  None  Antimicrobials:  (specify start and planned stop date. Auto populated tables are space occupying and do not give end dates)  IV ceftriaxone and azithromycin  11/28/2017 to 12/03/2017   Subjective: Patient reports that she is improving.  Her dyspnea has diminished.  She continues to require supplementary oxygen.  Objective: Vitals:   12/03/17 0544 12/03/17 0736 12/03/17 0738 12/03/17 0945  BP: (!) 140/91   134/86  Pulse: (!) 49     Resp: 19     Temp: 98.1 F (36.7 C)     TempSrc: Oral     SpO2: 95% (!) 85% 93%   Weight:      Height:        Intake/Output Summary (Last 24 hours) at 12/03/2017 1155 Last data filed at 12/03/2017 0839 Gross per 24 hour  Intake 480 ml  Output -  Net 480 ml   Filed Weights   11/28/17 2054  Weight: 107 kg (236 lb)    Examination:  General exam: Appears calm and comfortable  Respiratory system: No increased work of breathing, intermittent expiratory wheezes on bilateral lungs, no crackles or rhonchi Cardiovascular system: Distant heart sounds, no murmurs. Gastrointestinal system: Soft, nontender, nondistended Central nervous system: Alert and oriented. No focal neurological deficits, tremor noted in bilateral hands and chin Extremities: Trace lower extremity edema Skin: No rashes on visible skin Psychiatry: Judgement and insight appear normal. Mood & affect appropriate.     Data Reviewed: I have personally reviewed following labs and imaging studies  CBC: Recent Labs  Lab 11/28/17 2223 12/01/17 0516 12/02/17 0600  WBC 7.2 14.3* 12.3*  NEUTROABS 5.2  --   --   HGB 14.8 14.1 13.8  HCT 45.8 44.1 44.3  MCV 100.0 100.0 100.7*  PLT 129* 134* 093*   Basic Metabolic Panel: Recent Labs  Lab 11/28/17 2223 12/02/17 0600  NA 140 141  K 4.1 4.1  CL 107 107  CO2 25 26  GLUCOSE 107* 130*  BUN 20 32*  CREATININE 0.85 0.99  CALCIUM 8.3* 8.5*  MG  --  2.2   GFR: Estimated Creatinine Clearance: 61.1 mL/min (by C-G formula based on SCr of 0.99  mg/dL). Liver Function Tests: Recent Labs  Lab 11/28/17 2223  AST 24  ALT 17  ALKPHOS 42  BILITOT 0.9  PROT 6.6  ALBUMIN 3.5   No results for input(s): LIPASE, AMYLASE in the last 168 hours. No results for input(s): AMMONIA in the last 168 hours. Coagulation Profile: No results for input(s): INR, PROTIME in the last 168 hours. Cardiac Enzymes: Recent Labs  Lab 11/28/17 2223  TROPONINI 0.04*   BNP (last 3 results) No results for input(s): PROBNP in the last 8760 hours. HbA1C: No results for input(s): HGBA1C in the last 72 hours. CBG: No results for input(s): GLUCAP in the last 168 hours. Lipid Profile: No results for input(s): CHOL, HDL, LDLCALC, TRIG, CHOLHDL, LDLDIRECT in the last 72 hours. Thyroid Function Tests: No results for input(s): TSH, T4TOTAL, FREET4, T3FREE, THYROIDAB in the last 72 hours. Anemia Panel: No results for input(s): VITAMINB12, FOLATE, FERRITIN, TIBC, IRON, RETICCTPCT in the last 72 hours. Sepsis Labs: Recent Labs  Lab 11/28/17 2223  LATICACIDVEN 0.61    Recent Results (from the past 240 hour(s))  Blood culture (routine x 2)     Status: None (Preliminary result)   Collection Time: 11/28/17 10:23 PM  Result Value Ref Range Status   Specimen Description   Final    BLOOD LEFT HAND Performed at [facility], [place] [address] [place], [place] [address]    Special Requests   Final    BOTTLES DRAWN AEROBIC AND ANAEROBIC Blood Culture adequate volume Performed at [facility] [address], [place], [place] [address]    Culture   Final    NO GROWTH 3 DAYS Performed at [facility], [place] [address], [place], [place] [address]    Report Status PENDING  Incomplete  Blood culture (routine x 2)     Status: None (Preliminary result)   Collection Time: 11/28/17 10:23 PM  Result Value Ref Range Status   Specimen Description   Final    BLOOD RIGHT ANTECUBITAL Performed at [facility] [address] [place], [place] [address] [address]    Special Requests   Final    BOTTLES DRAWN AEROBIC ONLY Blood Culture adequate volume Performed at [facility] [address] [place], [place] [address]    Culture   Final    NO GROWTH 3 DAYS Performed at [facility], [place] [address], [place],  [address]    Report Status PENDING  Incomplete         Radiology Studies: Ct Chest W Contrast  Result Date: 12/01/2017 CLINICAL DATA:  Acute respiratory illness EXAM: CT CHEST WITH CONTRAST TECHNIQUE: Multidetector CT imaging of the chest was performed during intravenous contrast administration. CONTRAST:  61mL OMNIPAQUE IOHEXOL 300 MG/ML  SOLN COMPARISON:  None. FINDINGS: Cardiovascular: Normal heart size. No pericardial effusion. Ascending thoracic aortic aneurysm measuring 4 cm in diameter. Thoracic aortic atherosclerosis. Coronary artery atherosclerosis in the LAD. Mediastinum/Nodes: No enlarged mediastinal, hilar, or axillary lymph nodes. Thyroid gland, trachea, and esophagus demonstrate no significant findings. Lungs/Pleura: No focal consolidation. Mild subpleural reticular thickening. No pleural effusion or pneumothorax. Upper Abdomen: No acute upper abdominal abnormality. Musculoskeletal:  No chest wall abnormality. No acute or significant osseous findings. IMPRESSION: 1. No acute cardiopulmonary disease. 2. Aortic Atherosclerosis (ICD10-I70.0). 3. Ascending thoracic aortic aneurysm measuring 4 cm in diameter. Recommend semi-annual imaging followup by CTA or MRA and referral to cardiothoracic surgery if not already obtained. This recommendation follows 2010 ACCF/AHA/AATS/ACR/ASA/SCA/SCAI/SIR/STS/SVM Guidelines for the Diagnosis and Management of Patients With Thoracic Aortic Disease. Circulation. 2010; 121: e266-e36 4. Distal descending thoracic aorta measures 4.8 x 4 cm. Electronically Signed   By: Kathreen Devoid   On: 12/01/2017 14:15        Scheduled Meds: .  amLODipine  5 mg Oral BID  . atenolol  25 mg Oral QHS  . atorvastatin  10 mg Oral q1800  . clonazePAM  0.5 mg Oral BID  . clonazePAM  1 mg Oral QHS  . enoxaparin (LOVENOX) injection  40 mg Subcutaneous Q24H  . escitalopram  20 mg Oral QHS  . fluticasone  1 spray Each Nare QPM  . ipratropium-albuterol  3 mL Nebulization TID  . levothyroxine  50 mcg Oral QAC breakfast  . montelukast  10 mg Oral QHS  . predniSONE  40 mg Oral Q breakfast   Continuous Infusions:    LOS: 3 days    Time spent: [place], MD [facility]   If 7PM-7AM, please contact night-coverage [email or web address] Password [email or web address] 12/03/2017, 11:55 AM

## 2017-12-03 NOTE — NC FL2 (Signed)
[place] LEVEL OF CARE SCREENING TOOL     IDENTIFICATION  Patient Name: [name] Birthdate: 11-11-1945 Sex: female Admission Date (Current Location): 11/28/2017  [facility] and [place] Number:  Herbalist and Address:  [facility],  [place] [address] [place]      Provider Number: [id number]  Attending Physician Name and Address:  Cristy Folks, MD  Relative Name and Phone Number:       Current Level of Care: Hospital Recommended Level of Care: [place] Prior Approval Number:    Date Approved/Denied: 12/03/17 PASRR Number: [id number]  Discharge Plan: SNF    Current Diagnoses: Patient Active Problem List   Diagnosis Date Noted  . Atypical pneumonia 11/29/2017  . Dyspnea on exertion 10/27/2017  . Dyslipidemia 10/27/2017  . Morbid obesity ([place]) 10/27/2017  . Hx of colonic polyps 12/03/2016  . Lactose intolerance 07/26/2016  . Tremor 01/29/2016  . Incontinence 08/06/2015  . Bacteremia   . Sepsis secondary to UTI ([place]) 07/25/2015  . Sleep apnea in adult   . UTI (lower urinary tract infection)   . Distal radius fracture, left 07/08/2015  . MDD (major depressive disorder), recurrent severe, without psychosis ([place]) 03/21/2015  . 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 Grieving 03/08/2014  . Depressive disorder 03/08/2014  . Breast cancer of upper-outer quadrant of left female breast ([place]) 08/18/2013    Orientation RESPIRATION BLADDER Height & Weight     Self, Time, Situation, Place  O2 Incontinent Weight: 236 lb (107 kg) Height:  5\' 3"  (160 cm)  BEHAVIORAL SYMPTOMS/MOOD NEUROLOGICAL BOWEL NUTRITION STATUS      Continent Diet(See dc summary)  AMBULATORY STATUS COMMUNICATION OF NEEDS Skin   Extensive Assist Verbally Normal                       Personal Care Assistance Level of Assistance  Bathing, Feeding, Dressing Bathing Assistance: Limited assistance Feeding assistance: Independent Dressing  Assistance: Limited assistance     Functional Limitations Info  Sight, Hearing, Speech Sight Info: Adequate Hearing Info: Adequate Speech Info: Adequate    SPECIAL CARE FACTORS FREQUENCY  PT (By licensed PT), OT (By licensed OT)     PT Frequency: 5x/week OT Frequency: 5x/week            Contractures Contractures Info: Not present    Additional Factors Info  Code Status, Allergies Code Status Info: Full  Allergies Info: Codeine           Current Medications (12/03/2017):  This is the current hospital active medication list Current Facility-Administered Medications  Medication Dose Route Frequency Provider Last Rate Last Dose  . albuterol (PROVENTIL) (2.5 MG/3ML) 0.083% nebulizer solution 2.5 mg  2.5 mg Nebulization Q4H PRN Purohit, Shrey C, MD      . amLODipine (NORVASC) tablet 5 mg  5 mg Oral BID Etta Quill, DO   5 mg at 12/02/17 2158  . atenolol (TENORMIN) tablet 25 mg  25 mg Oral QHS Jennette Kettle M, DO   25 mg at 12/02/17 2158  . atorvastatin (LIPITOR) tablet 10 mg  10 mg Oral q1800 Jennette Kettle M, DO   10 mg at 12/02/17 1740  . benzonatate (TESSALON) capsule 200 mg  200 mg Oral TID PRN Purohit, Shrey C, MD   200 mg at 12/02/17 1740  . clonazePAM (KLONOPIN) tablet 0.5 mg  0.5 mg Oral BID Jennette Kettle M, DO   0.5 mg at 12/03/17 0827  .  clonazePAM (KLONOPIN) tablet 1 mg  1 mg Oral QHS Dorrene German, RPH   1 mg at 12/02/17 2158  . enoxaparin (LOVENOX) injection 40 mg  40 mg Subcutaneous Q24H Jennette Kettle M, DO   40 mg at 12/02/17 8182  . escitalopram (LEXAPRO) tablet 20 mg  20 mg Oral QHS Jennette Kettle M, DO   20 mg at 12/02/17 2158  . fluticasone (FLONASE) 50 MCG/ACT nasal spray 1 spray  1 spray Each Nare QPM Etta Quill, DO   1 spray at 12/02/17 1740  . guaiFENesin-dextromethorphan (ROBITUSSIN DM) 100-10 MG/5ML syrup 5 mL  5 mL Oral Q4H PRN Purohit, Konrad Dolores, MD   5 mL at 12/01/17 2009  . HYDROcodone-homatropine (HYCODAN) 5-1.5 MG/5ML syrup 5 mL  5  mL Oral Q4H PRN Purohit, Shrey C, MD   5 mL at 12/03/17 0157  . ipratropium-albuterol (DUONEB) 0.5-2.5 (3) MG/3ML nebulizer solution 3 mL  3 mL Nebulization TID Purohit, Shrey C, MD   3 mL at 12/03/17 0736  . levothyroxine (SYNTHROID, LEVOTHROID) tablet 50 mcg  50 mcg Oral QAC breakfast Etta Quill, DO   50 mcg at 12/03/17 9937  . meclizine (ANTIVERT) tablet 25 mg  25 mg Oral TID PRN Etta Quill, DO   25 mg at 11/30/17 1711  . montelukast (SINGULAIR) tablet 10 mg  10 mg Oral QHS Jennette Kettle M, DO   10 mg at 12/02/17 2158  . polyethylene glycol (MIRALAX / GLYCOLAX) packet 17 g  17 g Oral Daily PRN Kirby-Graham, Karsten Fells, NP      . predniSONE (DELTASONE) tablet 40 mg  40 mg Oral Q breakfast Purohit, Konrad Dolores, MD   40 mg at 12/03/17 1696     Discharge Medications: Please see discharge summary for a list of discharge medications.  Relevant Imaging Results:  Relevant Lab Results:   Additional Information ssn: [id number]  Servando Snare, LCSW

## 2017-12-03 NOTE — Care Management Important Message (Signed)
Important Message  Patient Details  Name: [name] MRN: [id number] Date of Birth: 1946/05/30   Medicare Important Message Given:  Yes    Kerin Salen 12/03/2017, 10:46 AMImportant Message  Patient Details  Name: [name] MRN: [id number] Date of Birth: 08-20-1946   Medicare Important Message Given:  Yes    Kerin Salen 12/03/2017, 10:46 AM

## 2017-12-04 LAB — CULTURE, BLOOD (ROUTINE X 2)
Culture: NO GROWTH
Culture: NO GROWTH
SPECIAL REQUESTS: ADEQUATE
Special Requests: ADEQUATE

## 2017-12-04 MED ORDER — ALBUTEROL SULFATE HFA 108 (90 BASE) MCG/ACT IN AERS: 2.0000 | INHALATION_SPRAY | Freq: Four times a day (QID) | RESPIRATORY_TRACT | 2 refills | Status: DC | PRN

## 2017-12-04 MED ORDER — BENZONATATE 200 MG PO CAPS: 200.0000 mg | ORAL_CAPSULE | Freq: Three times a day (TID) | ORAL | 1 refills | Status: DC | PRN

## 2017-12-04 MED ORDER — GUAIFENESIN-DM 100-10 MG/5ML PO SYRP: 5.0000 mL | ORAL_SOLUTION | ORAL | 0 refills | Status: AC | PRN

## 2017-12-04 MED ORDER — PREDNISONE 20 MG PO TABS: 40.0000 mg | ORAL_TABLET | Freq: Every day | ORAL | 0 refills | Status: DC

## 2017-12-04 MED ORDER — HYDROCODONE-HOMATROPINE 5-1.5 MG/5ML PO SYRP: 5.0000 mL | ORAL_SOLUTION | ORAL | 0 refills | Status: DC | PRN

## 2017-12-04 NOTE — Discharge Summary (Signed)
Physician Discharge Summary  [name]:[id number] DOB: [birth year] DOA: 11/28/2017  PCP: Aura Dials, MD  Admit date: 11/28/2017 Discharge date: 12/04/2017  Admitted From: Home Disposition:  SNF  Recommendations for Outpatient Follow-up:  1. Follow up with PCP in 1-2 weeks 2. Please obtain BMP/CBC in one week  Home Health:No Equipment/Devices:1-2 L [place]  Discharge Condition:stable CODE STATUS:FULL Diet recommendation: Heart Healthy    Brief/Interim Summary:  #) Atypical pneumonia with wheezing: Patient was admitted with atypical pneumonia or possibly viral pneumonia with wheezing.  She was given IV ceftriaxone and azithromycin for 5 days.  She was given IV steroids and transition to p.o. prednisone to complete a total of 2 additional days as an outpatient.  She was given scheduled bronchodilators with improvement in her symptoms.  On discharge required approximately 1-2 L of nasal cannula which she will be discharged home on and can be weaned as an outpatient or at a skilled nursing facility.  She will need formal PFTs from her primary care doctor if she is resolved from his acute illness.  A CT scan done on 12/01/2017 showed no evidence of pneumonia.  #) Descending thoracic aortic aneurysm: This was noted on CT scan on 12/01/2017.  It was approximately 4 cm in diameter.  Patient needs semiannual follow-up with a CTA or MRA and referral to cardiothoracic surgery as an outpatient.  #) Cough: The patient's most troublesome symptom.  She was started on as needed [employer], Robitussin-DM, as needed Hycodan.  She was discharged with prescriptions for these.  #) Hypertension: Patient was continued on her home amlodipine 5 mg twice daily.  #) hyperlipidemia: Patient was continued on home atorvastatin 10 mg.  #) Hypothyroidism: Patient was continued on home levothyroxine 50 mcg.  #) Essential tremor: Patient was continued on atenolol 25 mg nightly  #) Psych: Patient was continued on  her home clonazepam and citalopram doses.   Discharge Diagnoses:  Principal Problem:   Atypical pneumonia Active Problems:   MDD (major depressive disorder), recurrent severe, without psychosis ([place])   Tremor    Discharge Instructions  Discharge Instructions    Call MD for:  difficulty breathing, headache or visual disturbances   Complete by:  As directed    Call MD for:  hives   Complete by:  As directed    Call MD for:  persistant nausea and vomiting   Complete by:  As directed    Call MD for:  redness, tenderness, or signs of infection (pain, swelling, redness, odor or green/yellow discharge around incision site)   Complete by:  As directed    Call MD for:  severe uncontrolled pain   Complete by:  As directed    Call MD for:  temperature >100.4   Complete by:  As directed    Diet - low sodium heart healthy   Complete by:  As directed    Discharge instructions   Complete by:  As directed    Please completed your steroids as prescribed. Please follow up with your PCP in 1week.   Increase activity slowly   Complete by:  As directed      Allergies as of 12/04/2017      Reactions   Codeine Nausea And Vomiting      Medication List    TAKE these medications   albuterol 108 (90 Base) MCG/ACT inhaler Commonly known as:  PROVENTIL HFA;VENTOLIN HFA Inhale 2 puffs into the lungs every 6 (six) hours as needed for wheezing or shortness of  breath.   amLODipine 5 MG tablet Commonly known as:  NORVASC Take 5 mg by mouth 2 (two) times daily.   atenolol 25 MG tablet Commonly known as:  TENORMIN Take 25 mg by mouth at bedtime.   atorvastatin 10 MG tablet Commonly known as:  LIPITOR Take 10 mg by mouth daily.   benzonatate 200 MG capsule Commonly known as:  TESSALON Take 1 capsule (200 mg total) by mouth 3 (three) times daily as needed for cough (refractory to robitussin).   clonazePAM 1 MG tablet Commonly known as:  KLONOPIN Take by mouth. Takes 0.5mg  BID and 1 tablet  at bedtime.   escitalopram 20 MG tablet Commonly known as:  LEXAPRO Take 20 mg by mouth at bedtime.   fluticasone 50 MCG/ACT nasal spray Commonly known as:  FLONASE Place 1 spray into both nostrils every evening.   guaiFENesin-dextromethorphan 100-10 MG/5ML syrup Commonly known as:  ROBITUSSIN DM Take 5 mLs by mouth every 4 (four) hours as needed for cough.   HYDROcodone-homatropine 5-1.5 MG/5ML syrup Commonly known as:  HYCODAN Take 5 mLs by mouth every 4 (four) hours as needed for cough (if robitussin and tessalon don't work).   IMODIUM PO Take 1 tablet by mouth daily as needed (diarrhea).   levothyroxine 50 MCG tablet Commonly known as:  SYNTHROID, LEVOTHROID Take 50 mcg by mouth daily before breakfast.   meclizine 25 MG tablet Commonly known as:  ANTIVERT Take 1 tablet (25 mg total) by mouth 3 (three) times daily as needed for dizziness.   montelukast 10 MG tablet Commonly known as:  SINGULAIR Take 10 mg by mouth at bedtime.   naproxen sodium 220 MG tablet Commonly known as:  ALEVE Take 440 mg by mouth 2 (two) times daily as needed (pain).   nitroGLYCERIN 0.4 MG SL tablet Commonly known as:  NITROSTAT Place 1 tablet (0.4 mg total) under the tongue every 5 (five) minutes as needed for chest pain.   predniSONE 20 MG tablet Commonly known as:  DELTASONE Take 2 tablets (40 mg total) by mouth daily with breakfast. Start taking on:  12/05/2017   ranitidine 150 MG tablet Commonly known as:  ZANTAC Take 150 mg by mouth 2 (two) times daily.       Allergies  Allergen Reactions  . Codeine Nausea And Vomiting    Consultations:  None   Procedures/Studies: Dg Chest 2 View  Result Date: 11/29/2017 CLINICAL DATA:  72 y/o F; shortness of breath and cough for 3 weeks. EXAM: CHEST - 2 VIEW COMPARISON:  07/24/2015 chest radiograph FINDINGS: Stable cardiomegaly given projection and technique. Aortic atherosclerosis with calcification. Mild reticular opacities of the  lungs. No consolidation. No pleural effusion or pneumothorax. No acute osseous abnormality is evident. Surgical clips project over the left axilla. IMPRESSION: Mild reticular opacities of the lungs may represent interstitial edema or atypical pneumonia. No consolidation. Stable cardiomegaly. Electronically Signed   By: Kristine Garbe M.D.   On: 11/29/2017 00:09   Ct Chest W Contrast  Result Date: 12/01/2017 CLINICAL DATA:  Acute respiratory illness EXAM: CT CHEST WITH CONTRAST TECHNIQUE: Multidetector CT imaging of the chest was performed during intravenous contrast administration. CONTRAST:  20mL OMNIPAQUE IOHEXOL 300 MG/ML  SOLN COMPARISON:  None. FINDINGS: Cardiovascular: Normal heart size. No pericardial effusion. Ascending thoracic aortic aneurysm measuring 4 cm in diameter. Thoracic aortic atherosclerosis. Coronary artery atherosclerosis in the LAD. Mediastinum/Nodes: No enlarged mediastinal, hilar, or axillary lymph nodes. Thyroid gland, trachea, and esophagus demonstrate no significant findings. Lungs/Pleura: No  focal consolidation. Mild subpleural reticular thickening. No pleural effusion or pneumothorax. Upper Abdomen: No acute upper abdominal abnormality. Musculoskeletal: No chest wall abnormality. No acute or significant osseous findings. IMPRESSION: 1. No acute cardiopulmonary disease. 2. Aortic Atherosclerosis (ICD10-I70.0). 3. Ascending thoracic aortic aneurysm measuring 4 cm in diameter. Recommend semi-annual imaging followup by CTA or MRA and referral to cardiothoracic surgery if not already obtained. This recommendation follows 2010 ACCF/AHA/AATS/ACR/ASA/SCA/SCAI/SIR/STS/SVM Guidelines for the Diagnosis and Management of Patients With Thoracic Aortic Disease. Circulation. 2010; 121: e266-e36 4. Distal descending thoracic aorta measures 4.8 x 4 cm. Electronically Signed   By: Kathreen Devoid   On: 12/01/2017 14:15      Subjective:   Discharge Exam: Vitals:   12/04/17 0744  12/04/17 0745  BP:    Pulse:    Resp:    Temp:    SpO2: (!) 85% 91%   Vitals:   12/03/17 2108 12/04/17 0504 12/04/17 0744 12/04/17 0745  BP: (!) 153/81 (!) 144/85    Pulse: (!) 49 (!) 44    Resp: 20 (!) 21    Temp: 97.6 F (36.4 C) 97.7 F (36.5 C)    TempSrc: [place]    SpO2: 93% 98% (!) 85% 91%  Weight:      Height:        General: Pt is alert, awake, not in acute distress Cardiovascular: Regular rate and rhythm, no murmurs Respiratory: CTA bilaterally, no increased work of breathing, scattered rhonchi, intermittent expiratory wheezes, no rales Abdominal: Soft, NT, ND, bowel sounds + Extremities: 1+ lower extremity edema, tremor noted    The results of significant diagnostics from this hospitalization (including imaging, microbiology, ancillary and laboratory) are listed below for reference.     Microbiology: Recent Results (from the past 240 hour(s))  Blood culture (routine x 2)     Status: None (Preliminary result)   Collection Time: 11/28/17 10:23 PM  Result Value Ref Range Status   Specimen Description   Final    BLOOD LEFT HAND Performed at [facility], [place] [address] [place], [place] [address]    Special Requests   Final    BOTTLES DRAWN AEROBIC AND ANAEROBIC Blood Culture adequate volume Performed at [place] [address] [place], [place] [address]    Culture   Final    NO GROWTH 4 DAYS Performed at [facility], [place] [address] [place], [place] [address]    Report Status PENDING  Incomplete  Blood culture (routine x 2)     Status: None (Preliminary result)   Collection Time: 11/28/17 10:23 PM  Result Value Ref Range Status   Specimen Description   Final    BLOOD RIGHT ANTECUBITAL Performed at [place] [address] [place] [address] [place], [place] [address]    Special Requests   Final    BOTTLES DRAWN AEROBIC ONLY Blood Culture adequate volume Performed at [place] [address], [place], [place] [address]    Culture   Final    NO GROWTH 4 DAYS Performed at [facility], [place] [address] [place], [place] [address]    Report Status PENDING  Incomplete     Labs: BNP (last 3 results) Recent Labs    11/28/17 2223  BNP 24.4   Basic Metabolic Panel: Recent Labs  Lab 11/28/17 2223 12/02/17 0600  NA 140 141  K 4.1 4.1  CL 107 107  CO2 25 26  GLUCOSE 107* 130*  BUN  20 32*  CREATININE 0.85 0.99  CALCIUM 8.3* 8.5*  MG  --  2.2   Liver Function Tests: Recent Labs  Lab 11/28/17 2223  AST 24  ALT 17  ALKPHOS 42  BILITOT 0.9  PROT 6.6  ALBUMIN 3.5   No results for input(s): LIPASE, AMYLASE in the last 168 hours. No results for input(s): AMMONIA in the last 168 hours. CBC: Recent Labs  Lab 11/28/17 2223 12/01/17 0516 12/02/17 0600  WBC 7.2 14.3* 12.3*  NEUTROABS 5.2  --   --   HGB 14.8 14.1 13.8  HCT 45.8 44.1 44.3  MCV 100.0 100.0 100.7*  PLT 129* 134* 136*   Cardiac Enzymes: Recent Labs  Lab 11/28/17 2223  TROPONINI 0.04*   BNP: Invalid input(s): POCBNP CBG: No results for input(s): GLUCAP in the last 168 hours. D-Dimer No results for input(s): DDIMER in the last 72 hours. Hgb A1c No results for input(s): HGBA1C in the last 72 hours. Lipid Profile No results for input(s): CHOL, HDL, LDLCALC, TRIG, CHOLHDL, LDLDIRECT in the last 72 hours. Thyroid function studies No results for input(s): TSH, T4TOTAL, T3FREE, THYROIDAB in the last 72 hours.  Invalid input(s): FREET3 Anemia work up No results for input(s): VITAMINB12, FOLATE, FERRITIN, TIBC, IRON, RETICCTPCT in the last 72 hours. Urinalysis    Component Value Date/Time   COLORURINE YELLOW 07/24/2015 2241   APPEARANCEUR CLOUDY (A) 07/24/2015 2241   LABSPEC 1.010 07/24/2015 2241   PHURINE 6.5 07/24/2015 2241   GLUCOSEU NEGATIVE 07/24/2015 2241   HGBUR MODERATE (A) 07/24/2015 2241   BILIRUBINUR NEGATIVE 07/24/2015 2241   KETONESUR NEGATIVE  07/24/2015 2241   PROTEINUR 30 (A) 07/24/2015 2241   UROBILINOGEN 1.0 03/20/2015 0105   NITRITE NEGATIVE 07/24/2015 2241   LEUKOCYTESUR LARGE (A) 07/24/2015 2241   Sepsis Labs Invalid input(s): PROCALCITONIN,  WBC,  LACTICIDVEN Microbiology Recent Results (from the past 240 hour(s))  Blood culture (routine x 2)     Status: None (Preliminary result)   Collection Time: 11/28/17 10:23 PM  Result Value Ref Range Status   Specimen Description   Final    BLOOD LEFT HAND Performed at [facility], [place] [address], [place], [place] [address]    Special Requests   Final    BOTTLES DRAWN AEROBIC AND ANAEROBIC Blood Culture adequate volume Performed at [place] [address] [place], [place] [address]    Culture   Final    NO GROWTH 4 DAYS Performed at [facility], [place] [address], [place], [place] [address]    Report Status PENDING  Incomplete  Blood culture (routine x 2)     Status: None (Preliminary result)   Collection Time: 11/28/17 10:23 PM  Result Value Ref Range Status   Specimen Description   Final    BLOOD RIGHT ANTECUBITAL Performed at [place] [address], [place], [place] [address]    Special Requests   Final    BOTTLES DRAWN AEROBIC ONLY Blood Culture adequate volume Performed at [place] [address], [place], [place] [address]    Culture   Final    NO GROWTH 4 DAYS Performed at [facility], [place] [address], [place], [place] [address]    Report Status PENDING  Incomplete     Time coordinating discharge: Over 30 minutes  SIGNED:   Cristy Folks, MD  [facility] 12/04/2017, 1:14 PM  If 7PM-7AM, please contact night-coverage [email or web address] Password [email or web address]

## 2017-12-04 NOTE — Progress Notes (Signed)
[facility] here to transport pt to [facility]. All discharge paperwork and appropriate report given to transporters. Report given to Criss Rosales, LPN at [facility] and all questions answered.

## 2017-12-04 NOTE — Progress Notes (Signed)
Attempted to call report to [facility] rehab, transferred and no answer. Bethann Punches RN

## 2017-12-04 NOTE — Discharge Instructions (Signed)
Bronchospasm, Adult Bronchospasm is a tightening of the airways going into the lungs. During an episode, it may be harder to breathe. You may cough, and you may make a whistling sound when you breathe (wheeze). This condition often affects people with asthma. What are the causes? This condition is caused by swelling and irritation in the airways. It can be triggered by:  An infection (common).  Seasonal allergies.  An allergic reaction.  Exercise.  Irritants. These include pollution, cigarette smoke, strong odors, aerosol sprays, and paint fumes.  Weather changes. Winds increase molds and pollens in the air. Cold air may cause swelling.  Stress and emotional upset.  What are the signs or symptoms? Symptoms of this condition include:  Wheezing. If the episode was triggered by an allergy, wheezing may start right away or hours later.  Nighttime coughing.  Frequent or severe coughing with a simple cold.  Chest tightness.  Shortness of breath.  Decreased ability to exercise.  How is this diagnosed? This condition is usually diagnosed with a review of your medical history and a physical exam. Tests, such as lung function tests, are sometimes done to look for other conditions. The need for a chest X-ray depends on where the wheezing occurs and whether it is the first time you have wheezed. How is this treated? This condition may be treated with:  Inhaled medicines. These open up the airways and help you breathe. They can be taken with an inhaler or a nebulizer device.  Corticosteroid medicines. These may be given for severe bronchospasm, usually when it is associated with asthma.  Avoiding triggers, such as irritants, infection, or allergies.  Follow these instructions at home: Medicines  Take over-the-counter and prescription medicines only as told by your health care provider.  If you need to use an inhaler or nebulizer to take your medicine, ask your health care  provider to explain how to use it correctly. If you were given a spacer, always use it with your inhaler. Lifestyle  Reduce the number of triggers in your home. To do this: ? Change your heating and air conditioning filter at least once a month. ? Limit your use of fireplaces and wood stoves. ? Do not smoke. Do not allow smoking in your home. ? Avoid using perfumes and fragrances. ? Get rid of pests, such as roaches and mice, and their droppings. ? Remove any mold from your home. ? Keep your house clean and dust free. Use unscented cleaning products. ? Replace carpet with wood, tile, or vinyl flooring. Carpet can trap dander and dust. ? Use allergy-proof pillows, mattress covers, and box spring covers. ? Wash bed sheets and blankets every week in hot water. Dry them in a dryer. ? Use blankets that are made of polyester or cotton. ? Wash your hands often. ? Do not allow pets in your bedroom.  Avoid breathing in cold air when you exercise. General instructions  Have a plan for seeking medical care. Know when to call your health care provider and local emergency services, and where to get emergency care.  Stay up to date on your immunizations.  When you have an episode of bronchospasm, stay calm. Try to relax and breathe more slowly.  If you have asthma, make sure you have an asthma action plan.  Keep all follow-up visits as told by your health care provider. This is important. Contact a health care provider if:  You have muscle aches.  You have chest pain.  The mucus that you   cough up (sputum) changes from clear or white to yellow, green, gray, or bloody.  You have a fever.  Your sputum gets thicker. Get help right away if:  Your wheezing and coughing get worse, even after you take your prescribed medicines.  It gets even harder to breathe.  You develop severe chest pain. Summary  Bronchospasm is a tightening of the airways going into the lungs.  During an episode of  bronchospasm, you may have a harder time breathing. You may cough and make a whistling sound when you breathe (wheeze).  Avoid exposure to triggers such as smoke, dust, mold, animal dander, and fragrances.  When you have an episode of bronchospasm, stay calm. Try to relax and breathe more slowly. This information is not intended to replace advice given to you by your health care provider. Make sure you discuss any questions you have with your health care provider. Document Released: 08/15/2003 Document Revised: 08/08/2016 Document Reviewed: 08/08/2016 Elsevier Interactive Patient Education  2017 Elsevier Inc.  

## 2017-12-04 NOTE — Clinical Social Work Placement (Signed)
    Patient has bed at [facility] confirmed bed with facility.  Patient will transport by PTAR.  LCSW faxed dc Docs to facility.   LCSW notified family.  RN report #: [id number]  [facility]  CLINICAL SOCIAL WORK PLACEMENT  NOTE  Date:  12/04/2017  Patient Details  Name: [name] MRN: [id number] Date of Birth: [birth year]  Clinical Social Work is seeking post-discharge placement for this patient at the [place] level of care (*CSW will initial, date and re-position this form in  chart as items are completed):  Yes   Patient/family provided with [facility] Work Department's list of facilities offering this level of care within the geographic area requested by the patient (or if unable, by the patient's family).  Yes   Patient/family informed of their freedom to choose among providers that offer the needed level of care, that participate in Medicare, Medicaid or managed care program needed by the patient, have an available bed and are willing to accept the patient.  Yes   Patient/family informed of [facility] ownership interest in [facility] and [facility], as well as of the fact that they are under no obligation to receive care at these facilities.  PASRR submitted to EDS on       PASRR number received on 12/03/17     Existing PASRR number confirmed on       FL2 transmitted to all facilities in geographic area requested by pt/family on 11/26/17     FL2 transmitted to all facilities within larger geographic area on       Patient informed that his/her managed care company has contracts with or will negotiate with certain facilities, including the following:        Yes   Patient/family informed of bed offers received.  Patient chooses bed at [facility]     Physician recommends and patient chooses bed at      Patient to be transferred to [facility] on 12/04/17.  Patient to be  transferred to facility by EMS     Patient family notified on 12/04/17 of transfer.  Name of family member notified:  [name]     PHYSICIAN       Additional Comment:    _______________________________________________ Servando Snare, LCSW 12/04/2017, 2:24 PM

## 2017-12-04 NOTE — Progress Notes (Signed)
Patient chose bed at [facility].   Facility has not confirmed bed. Facility can confirm after noon.   LCSW will continue to follow.   Carolin Coy [place]

## 2017-12-04 NOTE — Progress Notes (Signed)
Physical Therapy Treatment Patient Details Name: [name] MRN: [id number] DOB: Sep 01, 1945 Today's Date: 12/04/2017    History of Present Illness 72 yo female admitted with atypical Pna. Hx tremores, seizures, pseudoseizures, MDD, anxiety, vertigo, breast cancer, anxiety, L wrist ORIF    PT Comments    Assisted to [facility] then amb in hallway with walker on RA.  Pt plans to D/C to SNF.  Follow Up Recommendations  SNF     Equipment Recommendations  None recommended by PT    Recommendations for Other Services       Precautions / Restrictions Precautions Precautions: Fall Restrictions Weight Bearing Restrictions: No    Mobility  Bed Mobility Overal bed mobility: Needs Assistance Bed Mobility: Supine to Sit;Sit to Supine     Supine to sit: Min guard;HOB elevated Sit to supine: Min assist   General bed mobility comments: increased time but able  Transfers Overall transfer level: Needs assistance Equipment used: Rolling walker (2 wheeled);None Transfers: Sit to/from [employer] to Stand: Min guard;Min assist Stand pivot transfers: Min guard;Min assist       General transfer comment: for safety with turns  Ambulation/Gait Ambulation/Gait assistance: Min assist Ambulation Distance (Feet): 55 Feet Assistive device: Rolling walker (2 wheeled) Gait Pattern/deviations: Step-through pattern Gait velocity: decreased but functional   General Gait Details: x 2 standing rest breaks.  Avg RA 90% and HR 88   Stairs            Wheelchair Mobility    Modified Rankin (Stroke Patients Only)       Balance                                            Cognition Arousal/Alertness: Awake/alert Behavior During Therapy: WFL for tasks assessed/performed Overall Cognitive Status: Within Functional Limits for tasks assessed                                        Exercises      General Comments         Pertinent Vitals/Pain Pain Assessment: No/denies pain    Home Living                      Prior Function            PT Goals (current goals can now be found in the care plan section) Progress towards PT goals: Progressing toward goals    Frequency    Min 3X/week      PT Plan Current plan remains appropriate    Co-evaluation              AM-PAC PT "6 Clicks" Daily Activity  Outcome Measure  Difficulty turning over in bed (including adjusting bedclothes, sheets and blankets)?: A Little Difficulty moving from lying on back to sitting on the side of the bed? : A Little Difficulty sitting down on and standing up from a chair with arms (e.g., wheelchair, bedside commode, etc,.)?: A Little Help needed moving to and from a bed to chair (including a wheelchair)?: A Lot Help needed walking in hospital room?: A Lot Help needed climbing 3-5 steps with a railing? : A Lot 6 Click Score: 15    End of Session Equipment Utilized During Treatment: Gait belt  Activity Tolerance: Patient tolerated treatment well Patient left: in bed;with call bell/phone within reach Nurse Communication: Mobility status PT Visit Diagnosis: Muscle weakness (generalized) (M62.81);Difficulty in walking, not elsewhere classified (R26.2)     Time: 6644-0347 PT Time Calculation (min) (ACUTE ONLY): 28 min  Charges:  $Gait Training: 8-22 mins $Therapeutic Activity: 8-22 mins                    G Codes:       Rica Koyanagi  PTA [facility]  Acute  Rehab Pager      [phone]

## 2017-12-05 ENCOUNTER — Ambulatory Visit (HOSPITAL_COMMUNITY): Admission: RE | Admit: 2017-12-05 | Payer: Medicare Other | Source: Ambulatory Visit | Admitting: General Surgery

## 2017-12-05 ENCOUNTER — Encounter: Payer: Self-pay | Admitting: Adult Health

## 2017-12-05 ENCOUNTER — Encounter (HOSPITAL_COMMUNITY): Admission: RE | Payer: Self-pay | Source: Ambulatory Visit

## 2017-12-05 ENCOUNTER — Non-Acute Institutional Stay (SKILLED_NURSING_FACILITY): Payer: Medicare Other | Admitting: Adult Health

## 2017-12-05 DIAGNOSIS — F332 Major depressive disorder, recurrent severe without psychotic features: Secondary | ICD-10-CM

## 2017-12-05 DIAGNOSIS — J309 Allergic rhinitis, unspecified: Secondary | ICD-10-CM | POA: Insufficient documentation

## 2017-12-05 DIAGNOSIS — J189 Pneumonia, unspecified organism: Secondary | ICD-10-CM

## 2017-12-05 DIAGNOSIS — I7123 Aneurysm of the descending thoracic aorta, without rupture: Secondary | ICD-10-CM | POA: Insufficient documentation

## 2017-12-05 DIAGNOSIS — I712 Thoracic aortic aneurysm, without rupture: Secondary | ICD-10-CM | POA: Diagnosis not present

## 2017-12-05 DIAGNOSIS — K219 Gastro-esophageal reflux disease without esophagitis: Secondary | ICD-10-CM | POA: Diagnosis not present

## 2017-12-05 DIAGNOSIS — E785 Hyperlipidemia, unspecified: Secondary | ICD-10-CM

## 2017-12-05 DIAGNOSIS — E034 Atrophy of thyroid (acquired): Secondary | ICD-10-CM | POA: Insufficient documentation

## 2017-12-05 DIAGNOSIS — M129 Arthropathy, unspecified: Secondary | ICD-10-CM | POA: Diagnosis not present

## 2017-12-05 DIAGNOSIS — I10 Essential (primary) hypertension: Secondary | ICD-10-CM | POA: Diagnosis not present

## 2017-12-05 SURGERY — MANOMETRY, ANORECTAL

## 2017-12-05 NOTE — Progress Notes (Addendum)
Location:   [place] Room Number: [place] of Service:  SNF (31)   CODE STATUS: Full Code  Allergies  Allergen Reactions  . Codeine Nausea And Vomiting    Chief Complaint  Patient presents with  . Hospitalization Follow-up    Hospital Follow up    HPI:  She is a 72 year old woman with a history of  pseudoseizures; anxiety has been hospitalized from 11-28-17 through 12-04-17. She has been hospitalized after a cough with sputum for 3 weeks. She had been admitted with atypical pneumonia and wheezing. She was placed on IV antibiotics and steroids and placed on 02. She will need 2 more days of prednisone to complete her therapy. She was found on a st scan on 12-01-17 to have a descending thoracic aneurysm. She will need a referral to cardiothoracic surgery upon discharge from this facility. She is here for short term rehab with her goal to return back home. She does have a cough; with shortness of breath; denies sputum production. She will continue to be followed for her chronic illnesses including: depression; hypothyroidism; gerd. There are no nursing concerns at this time.   Past Medical History:  Diagnosis Date  . Anemia   . Anxiety   . Arthritis   . Bladder incontinence   . Bowel incontinence   . Breast cancer ([place]) 08/16/13   left, 1 o'clock  . Chronic cystitis   . Chronic diarrhea   . Complication of anesthesia    hard to wake up  . Depression   . Full dentures   . GERD (gastroesophageal reflux disease)   . High blood pressure   . History of radiation therapy 11/02/13- 12/24/13   left breast 4680 cGy in 26 sessions, left breast boost 1400 cGy in 7 sessions  . HOH (hard of hearing)   . Hx of colonic polyps 12/03/2016  . Hypothyroidism   . Pneumonia    hx  . Pseudoseizures   . Shortness of breath    when walks  . Sleep apnea    has a cpap-5 hr/ not used in years  . Stroke ([place]) 1980   legs weak  . Tremor   . Urinary incontinence   .  Vertigo     Past Surgical History:  Procedure Laterality Date  . ABDOMINAL HYSTERECTOMY    . BACK SURGERY  2005   lumbar fusion  . BREAST LUMPECTOMY WITH NEEDLE LOCALIZATION AND AXILLARY SENTINEL LYMPH NODE BX Left 09/29/2013   Procedure: BREAST LUMPECTOMY WITH NEEDLE LOCALIZATION AND AXILLARY SENTINEL LYMPH NODE BX;  Surgeon: [name], MD;  Location: [facility];  Service: General;  Laterality: Left;  clean wound class  . COLONOSCOPY    . MULTIPLE TOOTH EXTRACTIONS    . ORIF WRIST FRACTURE Left 07/08/2015   Procedure: OPEN REDUCTION INTERNAL FIXATION (ORIF) LEFT WRIST FRACTURE AND REPAIR AS INDICATED;  Surgeon: Iran Planas, MD;  Location: [place];  Service: Orthopedics;  Laterality: Left;  . ovarian cysts    . TOTAL HIP ARTHROPLASTY  2005   rt total hip    Social History   Socioeconomic History  . Marital status: Widowed    Spouse name: Not on file  . Number of children: 2  . Years of education: 10th grade  . Highest education level: Not on file  Occupational History  . Occupation: Retired  Scientific laboratory technician  . Financial resource strain: Not on file  . Food insecurity:  Worry: Not on file    Inability: Not on file  . Transportation needs:    Medical: Not on file    Non-medical: Not on file  Tobacco Use  . Smoking status: Never Smoker  . Smokeless tobacco: Never Used  Substance and Sexual Activity  . Alcohol use: No  . Drug use: No  . Sexual activity: Not Currently    Comment: menarche age 19, first live birth age 75, P44,  contraception x 40 years, menopause 35  Lifestyle  . Physical activity:    Days per week: Not on file    Minutes per session: Not on file  . Stress: Not on file  Relationships  . Social connections:    Talks on phone: Not on file    Gets together: Not on file    Attends religious service: Not on file    Active member of club or organization: Not on file    Attends meetings of clubs or organizations: Not on file    Relationship  status: Not on file  . Intimate partner violence:    Fear of current or ex partner: Not on file    Emotionally abused: Not on file    Physically abused: Not on file    Forced sexual activity: Not on file  Other Topics Concern  . Not on file  Social History Narrative   Lives at home alone.   Right-handed.   1 cup caffeine per day.   Family History  Problem Relation Age of Onset  . Lung cancer Brother   . Diabetes Brother   . Stroke Mother   . Ataxia Neg Hx   . Chorea Neg Hx   . Dementia Neg Hx   . Mental retardation Neg Hx   . Migraines Neg Hx   . Multiple sclerosis Neg Hx   . Neurofibromatosis Neg Hx   . Neuropathy Neg Hx   . Parkinsonism Neg Hx   . Seizures Neg Hx       VITAL SIGNS BP 130/76   Pulse 76   Temp 98.1 F (36.7 C)   Resp 20   Ht 5\' 3"  (1.6 m)   Wt 236 lb (107 kg)   SpO2 93%   BMI 41.81 kg/m   Outpatient Encounter Medications as of 12/05/2017  Medication Sig Note  . albuterol (PROVENTIL HFA;VENTOLIN HFA) 108 (90 Base) MCG/ACT inhaler Inhale 2 puffs into the lungs every 6 (six) hours as needed for wheezing or shortness of breath.   Marland Kitchen amLODipine (NORVASC) 5 MG tablet Take 5 mg by mouth 2 (two) times daily.  03/20/2015: .  . atenolol (TENORMIN) 25 MG tablet Take 25 mg by mouth at bedtime.    Marland Kitchen atorvastatin (LIPITOR) 10 MG tablet Take 10 mg by mouth daily.    . benzonatate (TESSALON) 200 MG capsule Take 1 capsule (200 mg total) by mouth 3 (three) times daily as needed for cough (refractory to robitussin).   . clonazePAM (KLONOPIN) 1 MG tablet Take by mouth. Takes 0.5mg  BID and 1 tablet at bedtime.   Marland Kitchen escitalopram (LEXAPRO) 20 MG tablet Take 20 mg by mouth at bedtime.    . fluticasone (FLONASE) 50 MCG/ACT nasal spray Place 1 spray into both nostrils every evening.    Marland Kitchen guaiFENesin-dextromethorphan (ROBITUSSIN DM) 100-10 MG/5ML syrup Take 5 mLs by mouth every 4 (four) hours as needed for cough.   Marland Kitchen HYDROcodone-homatropine (HYCODAN) 5-1.5 MG/5ML syrup Take 5  mLs by mouth every 4 (four) hours as needed for  cough (if robitussin and tessalon don't work).   Marland Kitchen levothyroxine (SYNTHROID, LEVOTHROID) 50 MCG tablet Take 50 mcg by mouth daily before breakfast.  03/20/2015: .  Marland Kitchen Loperamide HCl (IMODIUM PO) Take 1 tablet by mouth daily as needed (diarrhea).   . meclizine (ANTIVERT) 25 MG tablet Take 1 tablet (25 mg total) by mouth 3 (three) times daily as needed for dizziness.   . montelukast (SINGULAIR) 10 MG tablet Take 10 mg by mouth at bedtime.   . naproxen sodium (ANAPROX) 220 MG tablet Take 440 mg by mouth 2 (two) times daily as needed (pain).    . nitroGLYCERIN (NITROSTAT) 0.4 MG SL tablet Place 1 tablet (0.4 mg total) under the tongue every 5 (five) minutes as needed for chest pain.   . predniSONE (DELTASONE) 20 MG tablet Take 2 tablets (40 mg total) by mouth daily with breakfast.   . ranitidine (ZANTAC) 150 MG tablet Take 150 mg by mouth 2 (two) times daily.    No facility-administered encounter medications on file as of 12/05/2017.      SIGNIFICANT DIAGNOSTIC EXAMS  TODAY:   11-28-17: chest x-ray: Mild reticular opacities of the lungs may represent interstitial edema or atypical pneumonia. No consolidation. Stable cardiomegaly  12-01-17: ct of chest: 1. No acute cardiopulmonary disease. 2. Aortic Atherosclerosis 3. Ascending thoracic aortic aneurysm measuring 4 cm in diameter. Recommend semi-annual imaging followup by CTA or MRA and referral to cardiothoracic surgery if not already obtained.  for the Diagnosis and Management of Patients With Thoracic Aortic Disease.  4. Distal descending thoracic aorta measures 4.8 x 4 cm.       LABS REVIEWED: TODAY  11-28-17: wbc 7.2; hgb 14.8; hct 45.8; mcv 100.0; plt 129 glucose 107; bun 20; creat 0.86; k+ 4.1; na++ 140; ca 8.3; liver normal albumin 3.5 BNP 24.9;   Blood culture: no growth;  12-02-17: wbc 12.3; hgb 13.8; hct 44.3; mcv  100.7; plt 136 glucose 130; bun 32; creat 0.99; k+ 4.1; na++141; ca 8.5 mag  2.2    Review of Systems  Constitutional: Negative for malaise/fatigue.  Respiratory: Positive for cough, shortness of breath and wheezing.        Using 02   Cardiovascular: Negative for chest pain, palpitations and leg swelling.  Gastrointestinal: Negative for abdominal pain, constipation and heartburn.  Musculoskeletal: Negative for back pain, joint pain and myalgias.  Skin: Negative.   Neurological: Negative for dizziness.  Psychiatric/Behavioral: The patient is not nervous/anxious.     Physical Exam  Constitutional: She is oriented to person, place, and time. She appears well-developed and well-nourished. No distress.  Obese   Neck: No thyromegaly present.  Cardiovascular: Normal rate, regular rhythm and intact distal pulses.  Murmur heard. 16/  Pulmonary/Chest: Effort normal. No respiratory distress. She has wheezes. She has rales.  Abdominal: Soft. Bowel sounds are normal. She exhibits no distension. There is no tenderness.  Musculoskeletal: She exhibits edema.  Is able to move all extremities Has trace lower extremity edema   Lymphadenopathy:    She has no cervical adenopathy.  Neurological: She is alert and oriented to person, place, and time.  Skin: Skin is warm and dry. She is not diaphoretic.  Psychiatric: She has a normal mood and affect.      ASSESSMENT/ PLAN:  TODAY:   1. Essential hypertension, benign: stable b/p 130/76: will continue norvasc 5 mg twice daily atenolol 25 mg daily   2. Dyslipidemia: stable will continue lipitor 10 mg daily   3. Chronic allergic rhinitis:  stable will continue flonase daily and singulair 10 mg daily   4. Hypothyroidism due to acquired atrophy of thyroid: stable will continue synthroid 50 mcg daily  5. gerd without esophagitis: stable will continue zantac 150 mg twice daily   6.  Major depressive disorder recurrent, severe, without psychosis: stable will continue lexapro 20 mg daily   7. 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 Arthritis/arthroplasty of multiple joints: stable will continue aleve 2 tabs twice daily as needed  9. Atypical pneumonia: stable has completed abt; will continue prednisone 40 mg daily for total of 2 days. Will continue tessalon perle 200 mg three times daily as needed; hycodan syrup 5 mL every 5 hours as needed  10. Descending thoracic aorta aneurysm: is without change: measures 4.8 x 4 cm; she will need follow up with CTS upon discharge.    Prednisone for 2 more days Will wean off 02 to keep 02 sat >=92% Will check cbc; bmp     MD is aware of resident's narcotic use and is in agreement with current plan of care. We will attempt to wean resident as apropriate   Ok Edwards NP [facility]  Contact [phone] Monday through Friday 8am- 5pm  After hours call [phone]

## 2017-12-05 NOTE — Progress Notes (Signed)
Notified Alisha at Dr.Gross's office that patient had been hospitalized for the past week and was transferred to [facility] center. Pt was scheduled for an Anal Manometry today.  Lars Mage stated she would follow up with the patient when she is better to reschedule the Anal Manometry.

## 2017-12-08 ENCOUNTER — Other Ambulatory Visit: Payer: Self-pay

## 2017-12-09 ENCOUNTER — Non-Acute Institutional Stay (SKILLED_NURSING_FACILITY): Payer: Medicare Other | Admitting: Internal Medicine

## 2017-12-09 ENCOUNTER — Encounter: Payer: Self-pay | Admitting: Internal Medicine

## 2017-12-09 DIAGNOSIS — F445 Conversion disorder with seizures or convulsions: Secondary | ICD-10-CM

## 2017-12-09 DIAGNOSIS — E034 Atrophy of thyroid (acquired): Secondary | ICD-10-CM | POA: Diagnosis not present

## 2017-12-09 DIAGNOSIS — I10 Essential (primary) hypertension: Secondary | ICD-10-CM | POA: Diagnosis not present

## 2017-12-09 DIAGNOSIS — J189 Pneumonia, unspecified organism: Secondary | ICD-10-CM

## 2017-12-09 DIAGNOSIS — I7123 Aneurysm of the descending thoracic aorta, without rupture: Secondary | ICD-10-CM

## 2017-12-09 DIAGNOSIS — G4730 Sleep apnea, unspecified: Secondary | ICD-10-CM

## 2017-12-09 DIAGNOSIS — F332 Major depressive disorder, recurrent severe without psychotic features: Secondary | ICD-10-CM

## 2017-12-09 DIAGNOSIS — I712 Thoracic aortic aneurysm, without rupture: Secondary | ICD-10-CM

## 2017-12-09 NOTE — Progress Notes (Signed)
Provider:  Veleta Miners Location:   [facility] Room Number: 295/A Place of Service:  SNF ([phone])  PCP: London Pepper, MD Patient Care Team: London Pepper, MD as PCP - General (Family Medicine) Alvester Chou, NP (Nurse Practitioner)  Extended Emergency Contact Information Primary Emergency Contact: [name] [place] of [place] Mobile Phone: [phone] Relation: Daughter  Code Status: Full Code Goals of Care: Advanced Directive information Advanced Directives 12/09/2017  Does Patient Have a Medical Advance Directive? Yes  Type of Advance Directive (No Data)  Does patient want to make changes to medical advance directive? No - Patient declined  Would patient like information on creating a medical advance directive? No - Patient declined  Pre-existing out of facility DNR order (yellow form or pink MOST form) -      Chief Complaint  Patient presents with  . New Admit To SNF    New Admission Visit    HPI: Patient is a 72 y.o. female seen today for admission to SNF for therapy after staying in the hospital from 04/05-04/11 Viral Pneumonitis.  Patient has h/o HTN, pseudoseizures and Anxiety patient presented To the hospital with shortness of breath and cough.  Her chest x-ray showed possible atypical pneumonia she was treated with IV antibiotics which was later [employer] after 5 days.  CTA showed no evidence of pneumonia and only reactive changes . she also was started on prednisone taper. Patient feels much better.  She still has some cough but much improved.  She is still using some oxygen prn.  Denies any chest pain fever or chills. Patient lives by herself.  Was driving and was independent at home.  She uses cane sometimes to ambulate.  Patient is already walking with her walker in facility.     Past Medical History:  Diagnosis Date  . Anemia   . Anxiety   . Arthritis   . Bladder incontinence   . Bowel incontinence   . Breast cancer ([place])  08/16/13   left, 1 o'clock  . Chronic cystitis   . Chronic diarrhea   . Complication of anesthesia    hard to wake up  . Depression   . Full dentures   . GERD (gastroesophageal reflux disease)   . High blood pressure   . History of radiation therapy 11/02/13- 12/24/13   left breast 4680 cGy in 26 sessions, left breast boost 1400 cGy in 7 sessions  . HOH (hard of hearing)   . Hx of colonic polyps 12/03/2016  . Hypothyroidism   . Pneumonia    hx  . Pseudoseizures   . Shortness of breath    when walks  . Sleep apnea    has a cpap-5 hr/ not used in years  . Stroke ([place]) 1980   legs weak  . Tremor   . Urinary incontinence   . Vertigo    Past Surgical History:  Procedure Laterality Date  . ABDOMINAL HYSTERECTOMY    . BACK SURGERY  2005   lumbar fusion  . BREAST LUMPECTOMY WITH NEEDLE LOCALIZATION AND AXILLARY SENTINEL LYMPH NODE BX Left 09/29/2013   Procedure: BREAST LUMPECTOMY WITH NEEDLE LOCALIZATION AND AXILLARY SENTINEL LYMPH NODE BX;  Surgeon: [name], MD;  Location: [facility];  Service: General;  Laterality: Left;  clean wound class  . COLONOSCOPY    . MULTIPLE TOOTH EXTRACTIONS    . ORIF WRIST FRACTURE Left 07/08/2015   Procedure: OPEN REDUCTION INTERNAL FIXATION (ORIF) LEFT WRIST FRACTURE AND  REPAIR AS INDICATED;  Surgeon: Iran Planas, MD;  Location: [facility];  Service: Orthopedics;  Laterality: Left;  . ovarian cysts    . TOTAL HIP ARTHROPLASTY  2005   rt total hip    reports that she has never smoked. She has never used smokeless tobacco. She reports that she does not drink alcohol or use drugs. Social History   Socioeconomic History  . Marital status: Widowed    Spouse name: Not on file  . Number of children: 2  . Years of education: 10th grade  . Highest education level: Not on file  Occupational History  . Occupation: Retired  Scientific laboratory technician  . Financial resource strain: Not on file  . Food insecurity:    Worry: Not on file    Inability: Not  on file  . Transportation needs:    Medical: Not on file    Non-medical: Not on file  Tobacco Use  . Smoking status: Never Smoker  . Smokeless tobacco: Never Used  Substance and Sexual Activity  . Alcohol use: No  . Drug use: No  . Sexual activity: Not Currently    Comment: menarche age 2, first live birth age 38, P15,  contraception x 40 years, menopause 72  Lifestyle  . Physical activity:    Days per week: Not on file    Minutes per session: Not on file  . Stress: Not on file  Relationships  . Social connections:    Talks on phone: Not on file    Gets together: Not on file    Attends religious service: Not on file    Active member of club or organization: Not on file    Attends meetings of clubs or organizations: Not on file    Relationship status: Not on file  . Intimate partner violence:    Fear of current or ex partner: Not on file    Emotionally abused: Not on file    Physically abused: Not on file    Forced sexual activity: Not on file  Other Topics Concern  . Not on file  Social History Narrative   Lives at home alone.   Right-handed.   1 cup caffeine per day.    Functional Status Survey:    Family History  Problem Relation Age of Onset  . Lung cancer Brother   . Diabetes Brother   . Stroke Mother   . Ataxia Neg Hx   . Chorea Neg Hx   . Dementia Neg Hx   . Mental retardation Neg Hx   . Migraines Neg Hx   . Multiple sclerosis Neg Hx   . Neurofibromatosis Neg Hx   . Neuropathy Neg Hx   . Parkinsonism Neg Hx   . Seizures Neg Hx     Health Maintenance  Topic Date Due  . Hepatitis C Screening  01/05/2018 (Originally Dec 17, 1945)  . INFLUENZA VACCINE  03/26/2018  . MAMMOGRAM  04/11/2018  . COLONOSCOPY  11/26/2021  . DEXA SCAN  Completed  . TETANUS/TDAP  Discontinued  . PNA vac Low Risk Adult  Discontinued    Allergies  Allergen Reactions  . Codeine Nausea And Vomiting    Allergies as of 12/09/2017      Reactions   Codeine Nausea And  Vomiting      Medication List        Accurate as of 12/09/17  1:28 PM. Always use your most recent med list.          albuterol 108 (90  Base) MCG/ACT inhaler Commonly known as:  PROVENTIL HFA;VENTOLIN HFA Inhale 2 puffs into the lungs every 6 (six) hours as needed for wheezing or shortness of breath.   amLODipine 5 MG tablet Commonly known as:  NORVASC Take 5 mg by mouth 2 (two) times daily.   atorvastatin 10 MG tablet Commonly known as:  LIPITOR Take 10 mg by mouth daily.   benzonatate 200 MG capsule Commonly known as:  TESSALON Take 1 capsule (200 mg total) by mouth 3 (three) times daily as needed for cough (refractory to robitussin).   clonazePAM 1 MG tablet Commonly known as:  KLONOPIN Take by mouth. Takes 0.5mg  BID and 1 tablet at bedtime.   escitalopram 20 MG tablet Commonly known as:  LEXAPRO Take 20 mg by mouth at bedtime.   fluticasone 50 MCG/ACT nasal spray Commonly known as:  FLONASE Place 1 spray into both nostrils every evening.   guaiFENesin-dextromethorphan 100-10 MG/5ML syrup Commonly known as:  ROBITUSSIN DM Take 5 mLs by mouth every 4 (four) hours as needed for cough.   HYDROcodone-homatropine 5-1.5 MG/5ML syrup Commonly known as:  HYCODAN Take 5 mLs by mouth every 4 (four) hours as needed for cough (if robitussin and tessalon don't work).   IMODIUM PO Take 1 tablet by mouth daily as needed (diarrhea).   levothyroxine 50 MCG tablet Commonly known as:  SYNTHROID, LEVOTHROID Take 50 mcg by mouth daily before breakfast.   meclizine 25 MG tablet Commonly known as:  ANTIVERT Take 1 tablet (25 mg total) by mouth 3 (three) times daily as needed for dizziness.   montelukast 10 MG tablet Commonly known as:  SINGULAIR Take 10 mg by mouth at bedtime.   naproxen sodium 220 MG tablet Commonly known as:  ALEVE Take 440 mg by mouth 2 (two) times daily as needed (pain).   nitroGLYCERIN 0.4 MG SL tablet Commonly known as:  NITROSTAT Place 1 tablet  (0.4 mg total) under the tongue every 5 (five) minutes as needed for chest pain.   predniSONE 20 MG tablet Commonly known as:  DELTASONE Give 10 mg by mouth for two more days stop on 12/10/2017. Then give 5 mg by mouth for two more days stop date 12/12/2017   ranitidine 150 MG tablet Commonly known as:  ZANTAC Take 150 mg by mouth 2 (two) times daily.       Review of Systems  Review of Systems  Constitutional: Negative for activity change, appetite change, chills, diaphoresis, fatigue and fever.  HENT: Negative for mouth sores, postnasal drip, rhinorrhea, sinus pain and sore throat.   Respiratory: Negative for apnea, cough, chest tightness, shortness of breath and wheezing.   Cardiovascular: Negative for chest pain, palpitations and leg swelling.  Gastrointestinal: Negative for abdominal distention, abdominal pain, constipation, diarrhea, nausea and vomiting.  Genitourinary: Negative for dysuria and frequency.  Musculoskeletal: Negative for arthralgias, joint swelling and myalgias.  Skin: Negative for rash.  Neurological: Negative for dizziness, syncope, weakness, light-headedness and numbness.  Psychiatric/Behavioral: Negative for behavioral problems, confusion and sleep disturbance.   Is  Vitals:   12/09/17 1151  BP: 127/72  Pulse: (!) 54  Resp: 20  Temp: (!) 97 F (36.1 C)  TempSrc: Oral   There is no height or weight on file to calculate BMI. Physical Exam  Constitutional: She is oriented to person, place, and time. She appears well-developed and well-nourished.  HENT:  Head: Normocephalic.  Mouth/Throat: Oropharynx is clear and moist.  Eyes: Pupils are equal, round, and reactive to light.  Neck: Neck supple.  Cardiovascular: Normal rate and regular rhythm.  No murmur heard. Pulmonary/Chest: Effort normal and breath sounds normal. No stridor. No respiratory distress. She has no wheezes.  Abdominal: Soft. Bowel sounds are normal. She exhibits no distension. There is  no tenderness. There is no guarding.  Musculoskeletal:  Trace edema Bilateral  Lymphadenopathy:    She has no cervical adenopathy.  Neurological: She is alert and oriented to person, place, and time.  No Focal deficits  Skin: Skin is warm and dry.  Psychiatric: She has a normal mood and affect. Her behavior is normal. Thought content normal.    Labs reviewed: Basic Metabolic Panel: Recent Labs    11/28/17 2223 12/02/17 0600  NA 140 141  K 4.1 4.1  CL 107 107  CO2 25 26  GLUCOSE 107* 130*  BUN 20 32*  CREATININE 0.85 0.99  CALCIUM 8.3* 8.5*  MG  --  2.2   Liver Function Tests: Recent Labs    11/28/17 2223  AST 24  ALT 17  ALKPHOS 42  BILITOT 0.9  PROT 6.6  ALBUMIN 3.5   No results for input(s): LIPASE, AMYLASE in the last 8760 hours. No results for input(s): AMMONIA in the last 8760 hours. CBC: Recent Labs    11/28/17 2223 12/01/17 0516 12/02/17 0600  WBC 7.2 14.3* 12.3*  NEUTROABS 5.2  --   --   HGB 14.8 14.1 13.8  HCT 45.8 44.1 44.3  MCV 100.0 100.0 100.7*  PLT 129* 134* 136*   Cardiac Enzymes: Recent Labs    11/28/17 2223  TROPONINI 0.04*   BNP: Invalid input(s): POCBNP No results found for: HGBA1C No results found for: TSH No results found for: VITAMINB12 No results found for: FOLATE No results found for: IRON, TIBC, FERRITIN  Imaging and Procedures obtained prior to SNF admission: Ct Chest W Contrast  Result Date: 12/01/2017 CLINICAL DATA:  Acute respiratory illness EXAM: CT CHEST WITH CONTRAST TECHNIQUE: Multidetector CT imaging of the chest was performed during intravenous contrast administration. CONTRAST:  39mL OMNIPAQUE IOHEXOL 300 MG/ML  SOLN COMPARISON:  None. FINDINGS: Cardiovascular: Normal heart size. No pericardial effusion. Ascending thoracic aortic aneurysm measuring 4 cm in diameter. Thoracic aortic atherosclerosis. Coronary artery atherosclerosis in the LAD. Mediastinum/Nodes: No enlarged mediastinal, hilar, or axillary lymph  nodes. Thyroid gland, trachea, and esophagus demonstrate no significant findings. Lungs/Pleura: No focal consolidation. Mild subpleural reticular thickening. No pleural effusion or pneumothorax. Upper Abdomen: No acute upper abdominal abnormality. Musculoskeletal: No chest wall abnormality. No acute or significant osseous findings. IMPRESSION: 1. No acute cardiopulmonary disease. 2. Aortic Atherosclerosis (ICD10-I70.0). 3. Ascending thoracic aortic aneurysm measuring 4 cm in diameter. Recommend semi-annual imaging followup by CTA or MRA and referral to cardiothoracic surgery if not already obtained. This recommendation follows 2010 ACCF/AHA/AATS/ACR/ASA/SCA/SCAI/SIR/STS/SVM Guidelines for the Diagnosis and Management of Patients With Thoracic Aortic Disease. Circulation. 2010; 121: e266-e36 4. Distal descending thoracic aorta measures 4.8 x 4 cm. Electronically Signed   By: Kathreen Devoid   On: 12/01/2017 14:15    Assessment/Plan  Atypical pneumonia /Viral Pneumonitis Patient got 5 days of antibiotics in the hospital Is doing very well on prednisone taper We will try to taper the oxygen Continue therapy  her cough is much improved Will D/C Hycodan in next few days.  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  Follow up with her PCP MDD (major depressive disorder),  Stable on Lexapro Allergic rhinitis Continue on Singulair and  Flonase Disposition Patient plans to go home    Family/ staff Communication:   Labs/tests ordered: Her repeat Labs in the facility  are pending.  Total time spent in this patient care encounter was 45_ minutes; greater than 50% of the visit spent counseling patient, reviewing records , Labs and coordinating care for problems addressed at this encounter.

## 2017-12-23 ENCOUNTER — Non-Acute Institutional Stay (SKILLED_NURSING_FACILITY): Payer: Medicare Other | Admitting: Internal Medicine

## 2017-12-23 ENCOUNTER — Encounter: Payer: Self-pay | Admitting: Internal Medicine

## 2017-12-23 DIAGNOSIS — N390 Urinary tract infection, site not specified: Secondary | ICD-10-CM | POA: Diagnosis not present

## 2017-12-23 DIAGNOSIS — I712 Thoracic aortic aneurysm, without rupture: Secondary | ICD-10-CM | POA: Diagnosis not present

## 2017-12-23 DIAGNOSIS — F445 Conversion disorder with seizures or convulsions: Secondary | ICD-10-CM | POA: Diagnosis not present

## 2017-12-23 DIAGNOSIS — J189 Pneumonia, unspecified organism: Secondary | ICD-10-CM | POA: Diagnosis not present

## 2017-12-23 DIAGNOSIS — F332 Major depressive disorder, recurrent severe without psychotic features: Secondary | ICD-10-CM | POA: Diagnosis not present

## 2017-12-23 DIAGNOSIS — I159 Secondary hypertension, unspecified: Secondary | ICD-10-CM | POA: Diagnosis not present

## 2017-12-23 DIAGNOSIS — R42 Dizziness and giddiness: Secondary | ICD-10-CM

## 2017-12-23 DIAGNOSIS — I10 Essential (primary) hypertension: Secondary | ICD-10-CM

## 2017-12-23 DIAGNOSIS — E034 Atrophy of thyroid (acquired): Secondary | ICD-10-CM | POA: Diagnosis not present

## 2017-12-23 DIAGNOSIS — I7123 Aneurysm of the descending thoracic aorta, without rupture: Secondary | ICD-10-CM

## 2017-12-23 DIAGNOSIS — R569 Unspecified convulsions: Secondary | ICD-10-CM

## 2017-12-23 NOTE — Progress Notes (Signed)
Location:  [place] and [place] of Service:   Mathis D AlexanderMD   PCP: London Pepper, MD Patient Care Team: London Pepper, MD as PCP - General (Family Medicine) Alvester Chou, NP (Nurse Practitioner)  Extended Emergency Contact Information Primary Emergency Contact: [name] [place] Mobile Phone: [phone] Relation: Daughter  Allergies  Allergen Reactions  . Codeine Nausea And Vomiting    Chief Complaint  Patient presents with  . Discharge Note    HPI:  72 y.o. female hypertension, pseudoseizures, and anxiety who presented to [facility] with shortness of breath and cough with a chest x-ray that showed possible atypical pneumonia.  Patient was admitted to [facility] from 4/5-11 for viral pneumonia.  Her antibiotics were stopped and she was placed on a prednisone taper which really made her feel much better.  Patient has some cough but much improved she still using oxygen as needed she denies any chest pain fever or chills patient lives by herself and was driving and was independent home she uses cane sometimes to ambulat.  Patient was admitted to skilled nursing facility for OT/PT and is now ready to be discharged to home.    Past Medical History:  Diagnosis Date  . Anemia   . Anxiety   . Arthritis   . Bladder incontinence   . Bowel incontinence   . Breast cancer ([place]) 08/16/13   left, 1 o'clock  . Chronic cystitis   . Chronic diarrhea   . Complication of anesthesia    hard to wake up  . Depression   . Full dentures   . GERD (gastroesophageal reflux disease)   . High blood pressure   . History of radiation therapy 11/02/13- 12/24/13   left breast 4680 cGy in 26 sessions, left breast boost 1400 cGy in 7 sessions  . HOH (hard of hearing)   . Hx of colonic polyps 12/03/2016  . Hypothyroidism   . Pneumonia    hx  . Pseudoseizures   . Shortness of breath    when walks  . Sleep  apnea    has a cpap-5 hr/ not used in years  . Stroke ([place]) 1980   legs weak  . Tremor   . Urinary incontinence   . Vertigo     Past Surgical History:  Procedure Laterality Date  . ABDOMINAL HYSTERECTOMY    . BACK SURGERY  2005   lumbar fusion  . BREAST LUMPECTOMY WITH NEEDLE LOCALIZATION AND AXILLARY SENTINEL LYMPH NODE BX Left 09/29/2013   Procedure: BREAST LUMPECTOMY WITH NEEDLE LOCALIZATION AND AXILLARY SENTINEL LYMPH NODE BX;  Surgeon: [name], MD;  Location: [place];  Service: General;  Laterality: Left;  clean wound class  . COLONOSCOPY    . MULTIPLE TOOTH EXTRACTIONS    . ORIF WRIST FRACTURE Left 07/08/2015   Procedure: OPEN REDUCTION INTERNAL FIXATION (ORIF) LEFT WRIST FRACTURE AND REPAIR AS INDICATED;  Surgeon: Iran Planas, MD;  Location: [place];  Service: Orthopedics;  Laterality: Left;  . ovarian cysts    . TOTAL HIP ARTHROPLASTY  2005   rt total hip     reports that she has never smoked. She has never used smokeless tobacco. She reports that she does not drink alcohol or use drugs. Social History   Socioeconomic History  . Marital status: Widowed    Spouse name: Not on file  . Number of children: 2  . Years of education: 10th grade  .  Highest education level: Not on file  Occupational History  . Occupation: Retired  Scientific laboratory technician  . Financial resource strain: Not on file  . Food insecurity:    Worry: Not on file    Inability: Not on file  . Transportation needs:    Medical: Not on file    Non-medical: Not on file  Tobacco Use  . Smoking status: Never Smoker  . Smokeless tobacco: Never Used  Substance and Sexual Activity  . Alcohol use: No  . Drug use: No  . Sexual activity: Not Currently    Comment: menarche age 81, first live birth age 13, P22,  contraception x 40 years, menopause 12  Lifestyle  . Physical activity:    Days per week: Not on file    Minutes per session: Not on file  . Stress: Not on file  Relationships  .  Social connections:    Talks on phone: Not on file    Gets together: Not on file    Attends religious service: Not on file    Active member of club or organization: Not on file    Attends meetings of clubs or organizations: Not on file    Relationship status: Not on file  . Intimate partner violence:    Fear of current or ex partner: Not on file    Emotionally abused: Not on file    Physically abused: Not on file    Forced sexual activity: Not on file  Other Topics Concern  . Not on file  Social History Narrative   Lives at home alone.   Right-handed.   1 cup caffeine per day.    Pertinent  Health Maintenance Due  Topic Date Due  . INFLUENZA VACCINE  03/26/2018  . MAMMOGRAM  04/11/2018  . COLONOSCOPY  11/26/2021  . DEXA SCAN  Completed  . PNA vac Low Risk Adult  Discontinued    Medications: Allergies as of 12/23/2017      Reactions   Codeine Nausea And Vomiting      Medication List        Accurate as of 12/23/17  1:11 PM. Always use your most recent med list.          albuterol 108 (90 Base) MCG/ACT inhaler Commonly known as:  PROVENTIL HFA;VENTOLIN HFA Inhale 2 puffs into the lungs every 6 (six) hours as needed for wheezing or shortness of breath.   amLODipine 5 MG tablet Commonly known as:  NORVASC Take 5 mg by mouth 2 (two) times daily.   atorvastatin 10 MG tablet Commonly known as:  LIPITOR Take 10 mg by mouth daily.   benzonatate 200 MG capsule Commonly known as:  TESSALON Take 1 capsule (200 mg total) by mouth 3 (three) times daily as needed for cough (refractory to robitussin).   clonazePAM 1 MG tablet Commonly known as:  KLONOPIN Take by mouth. Takes 0.5mg  BID and 1 tablet at bedtime.   escitalopram 20 MG tablet Commonly known as:  LEXAPRO Take 20 mg by mouth at bedtime.   fluticasone 50 MCG/ACT nasal spray Commonly known as:  FLONASE Place 1 spray into both nostrils every evening.   guaiFENesin-dextromethorphan 100-10 MG/5ML  syrup Commonly known as:  ROBITUSSIN DM Take 5 mLs by mouth every 4 (four) hours as needed for cough.   HYDROcodone-homatropine 5-1.5 MG/5ML syrup Commonly known as:  HYCODAN Take 5 mLs by mouth every 4 (four) hours as needed for cough (if robitussin and tessalon don't work).   IMODIUM PO  Take 1 tablet by mouth daily as needed (diarrhea).   levothyroxine 50 MCG tablet Commonly known as:  SYNTHROID, LEVOTHROID Take 50 mcg by mouth daily before breakfast.   meclizine 25 MG tablet Commonly known as:  ANTIVERT Take 1 tablet (25 mg total) by mouth 3 (three) times daily as needed for dizziness.   montelukast 10 MG tablet Commonly known as:  SINGULAIR Take 10 mg by mouth at bedtime.   naproxen sodium 220 MG tablet Commonly known as:  ALEVE Take 440 mg by mouth 2 (two) times daily as needed (pain).   nitroGLYCERIN 0.4 MG SL tablet Commonly known as:  NITROSTAT Place 1 tablet (0.4 mg total) under the tongue every 5 (five) minutes as needed for chest pain.   predniSONE 20 MG tablet Commonly known as:  DELTASONE Give 10 mg by mouth for two more days stop on 12/10/2017. Then give 5 mg by mouth for two more days stop date 12/12/2017   ranitidine 150 MG tablet Commonly known as:  ZANTAC Take 150 mg by mouth 2 (two) times daily.        Vitals:   12/23/17 1309  BP: 122/77  Pulse: 85  Resp: 20  Temp: 97.6 F (36.4 C)   There is no height or weight on file to calculate BMI.  Physical Exam  GENERAL APPEARANCE: Alert, conversant. No acute distress.  HEENT: Unremarkable. RESPIRATORY: Breathing is even, unlabored. Lung sounds are clear   CARDIOVASCULAR: Heart RRR no murmurs, rubs or gallops.  peripheral edema.  GASTROINTESTINAL: Abdomen is soft, non-tender, not distended w/ normal bowel sounds.  NEUROLOGIC: Cranial nerves 2-12 grossly intact. Moves all extremities   Labs reviewed: Basic Metabolic Panel: Recent Labs    11/28/17 2223 12/02/17 0600  NA 140 141  K 4.1 4.1   CL 107 107  CO2 25 26  GLUCOSE 107* 130*  BUN 20 32*  CREATININE 0.85 0.99  CALCIUM 8.3* 8.5*  MG  --  2.2   No results found for: [facility] Liver Function Tests: Recent Labs    11/28/17 2223  AST 24  ALT 17  ALKPHOS 42  BILITOT 0.9  PROT 6.6  ALBUMIN 3.5   No results for input(s): LIPASE, AMYLASE in the last 8760 hours. No results for input(s): AMMONIA in the last 8760 hours. CBC: Recent Labs    11/28/17 2223 12/01/17 0516 12/02/17 0600  WBC 7.2 14.3* 12.3*  NEUTROABS 5.2  --   --   HGB 14.8 14.1 13.8  HCT 45.8 44.1 44.3  MCV 100.0 100.0 100.7*  PLT 129* 134* 136*   Lipid No results for input(s): CHOL, HDL, LDLCALC, TRIG in the last 8760 hours. Cardiac Enzymes: Recent Labs    11/28/17 2223  TROPONINI 0.04*   BNP: Recent Labs    11/28/17 2223  BNP 24.9   CBG: No results for input(s): GLUCAP in the last 8760 hours.  Procedures and Imaging Studies During Stay: Dg Chest 2 View  Result Date: 11/29/2017 CLINICAL DATA:  72 y/o F; shortness of breath and cough for 3 weeks. EXAM: CHEST - 2 VIEW COMPARISON:  07/24/2015 chest radiograph FINDINGS: Stable cardiomegaly given projection and technique. Aortic atherosclerosis with calcification. Mild reticular opacities of the lungs. No consolidation. No pleural effusion or pneumothorax. No acute osseous abnormality is evident. Surgical clips project over the left axilla. IMPRESSION: Mild reticular opacities of the lungs may represent interstitial edema or atypical pneumonia. No consolidation. Stable cardiomegaly. Electronically Signed   By: Edgardo Roys.D.  On: 11/29/2017 00:09   Ct Chest W Contrast  Result Date: 12/01/2017 CLINICAL DATA:  Acute respiratory illness EXAM: CT CHEST WITH CONTRAST TECHNIQUE: Multidetector CT imaging of the chest was performed during intravenous contrast administration. CONTRAST:  9mL OMNIPAQUE IOHEXOL 300 MG/ML  SOLN COMPARISON:  None. FINDINGS: Cardiovascular: Normal heart  size. No pericardial effusion. Ascending thoracic aortic aneurysm measuring 4 cm in diameter. Thoracic aortic atherosclerosis. Coronary artery atherosclerosis in the LAD. Mediastinum/Nodes: No enlarged mediastinal, hilar, or axillary lymph nodes. Thyroid gland, trachea, and esophagus demonstrate no significant findings. Lungs/Pleura: No focal consolidation. Mild subpleural reticular thickening. No pleural effusion or pneumothorax. Upper Abdomen: No acute upper abdominal abnormality. Musculoskeletal: No chest wall abnormality. No acute or significant osseous findings. IMPRESSION: 1. No acute cardiopulmonary disease. 2. Aortic Atherosclerosis (ICD10-I70.0). 3. Ascending thoracic aortic aneurysm measuring 4 cm in diameter. Recommend semi-annual imaging followup by CTA or MRA and referral to cardiothoracic surgery if not already obtained. This recommendation follows 2010 ACCF/AHA/AATS/ACR/ASA/SCA/SCAI/SIR/STS/SVM Guidelines for the Diagnosis and Management of Patients With Thoracic Aortic Disease. Circulation. 2010; 121: e266-e36 4. Distal descending thoracic aorta measures 4.8 x 4 cm. Electronically Signed   By: Kathreen Devoid   On: 12/01/2017 14:15    Assessment/Plan:   No diagnosis found.   Patient is being discharged with the following home health services: OT/PT/nursing/social work  Patient is being discharged with the following durable medical equipment: None  Patient has been advised to f/u with their PCP in 1-2 weeks to bring them up to date on their rehab stay.  Social services at facility was responsible for arranging this appointment.  Pt was provided with a 30 day supply of prescriptions for medications and refills must be obtained from their PCP.  For controlled substances, a more limited supply may be provided adequate until PCP appointment only.  Medications have been reconciled. Time spent greater than 30 minutes;> 50% of time with patient was spent reviewing records, labs, tests and studies,  counseling and developing plan of care  Inocencio Homes, MD

## 2018-05-02 ENCOUNTER — Other Ambulatory Visit: Payer: Self-pay | Admitting: Family Medicine

## 2018-05-02 DIAGNOSIS — M7989 Other specified soft tissue disorders: Secondary | ICD-10-CM

## 2018-05-04 ENCOUNTER — Telehealth: Payer: Self-pay | Admitting: Internal Medicine

## 2018-05-04 NOTE — Telephone Encounter (Signed)
Patient with dark stools and diarrhea She will come in and see [name] RNP on 05/13/18

## 2018-05-13 ENCOUNTER — Encounter (INDEPENDENT_AMBULATORY_CARE_PROVIDER_SITE_OTHER): Payer: Self-pay

## 2018-05-13 ENCOUNTER — Ambulatory Visit (INDEPENDENT_AMBULATORY_CARE_PROVIDER_SITE_OTHER): Payer: Medicare Other | Admitting: Nurse Practitioner

## 2018-05-13 ENCOUNTER — Encounter: Payer: Self-pay | Admitting: Nurse Practitioner

## 2018-05-13 VITALS — BP 124/80 | HR 67 | Ht 62.0 in | Wt 237.0 lb

## 2018-05-13 DIAGNOSIS — R159 Full incontinence of feces: Secondary | ICD-10-CM

## 2018-05-13 DIAGNOSIS — K648 Other hemorrhoids: Secondary | ICD-10-CM | POA: Diagnosis not present

## 2018-05-13 NOTE — Patient Instructions (Signed)
If you are age 72 or older, your body mass index should be between 23-30. Your Body mass index is 43.35 kg/m. If this is out of the aforementioned range listed, please consider follow up with your Primary Care Provider.  If you are age 54 or younger, your body mass index should be between 19-25. Your Body mass index is 43.35 kg/m. If this is out of the aformentioned range listed, please consider follow up with your Primary Care Provider.   Follow up appointment on June 09, 2018 at 3:15 pm with Dr Carlean Purl for hemorrhoid banding.  Thank you for choosing me and [facility].   Tye Savoy, NP

## 2018-05-13 NOTE — Progress Notes (Signed)
[name]  Primary GI:   Silvano Rusk, MD  Chief Complaint:    Fecal incontinence  IMPRESSION and PLAN:    20. 72 yo female with chronic fecal incontinence. She is frequently constipated with straining but sometimes has diarrhea depending on what she eats. She is frequenly incontinent of stool , even solid stool. She also has urinary incontinence.  We were going to ,or ? Did  refer for pelvic floor PT . She saw [facility], PT wasn't ordered but she got "a shot" in her bladder for incontinence but it didn't help ( ? [place]). In addition to pelvic flood PT we had thought about banding internal hemorrhoids to help with the incontinence. Patient is despondent, asking about options -she has internal hemorrhoids on anoscopy. Information about banding given. Will schedule her with Dr. Carlean Purl.  -Will see who Dr. Carlean Purl uses for pelvic floor PT and make the referral (unless it is with [facility]). They apparenlty told her PT wouldn't help??  2. Descending thoracic aneurysm seen on CT scan in April 2018. We recommended referral to Cardiothoracic Surgeon. Patient doesn't know if she was ever evaluated. Her PCP is with [facility], I don't have access to those records.     HPI:     Patient is a 72 year old female a history of colon polyps, chronic fecal urgency/incontinence. Patient is a poor historian, she can't remember who she has seen for what. Says her rectum has dropped, comes out during defecation. Sometimes has some scant painless rectal bleeding with BM. No abdominal pain.    Last colonoscopy April 2018  -decreased sphincter tone -5 mm polyp in the distal transverse colon, removed. Bx - Hyperplastic - Diverticulosis. - Internal hemorrhoids. - The examination was otherwise normal on direct and retroflexion views.  She has chronic constipation, sometimes gets diarrhea depending on diet. Diary especially problematic. No significant diarrhea with dietary discretion. She is frequently  incontinent of stool, even formed stool. This is embarrassing as others have mentioned that she smells of feces. She is also incontinent of urine. She saw Dr. Wendy Poet. Apparently told that pelvic floor PT wouldn't be helpful. Sounds like she got botox injection for bladder leakage but it didn't help.   Review of systems:     No chest pain, no SOB, no fevers, no urinary sx   Past Medical History:  Diagnosis Date  . Anemia   . Anxiety   . Arthritis   . Bladder incontinence   . Bowel incontinence   . Breast cancer ([place]) 08/16/13   left, 1 o'clock  . Chronic cystitis   . Chronic diarrhea   . Complication of anesthesia    hard to wake up  . Depression   . Full dentures   . GERD (gastroesophageal reflux disease)   . High blood pressure   . History of radiation therapy 11/02/13- 12/24/13   left breast 4680 cGy in 26 sessions, left breast boost 1400 cGy in 7 sessions  . HOH (hard of hearing)   . Hx of colonic polyps 12/03/2016  . Hypothyroidism   . Pneumonia    hx  . Pseudoseizures   . Shortness of breath    when walks  . Sleep apnea    has a cpap-5 hr/ not used in years  . Stroke ([place]) 1980   legs weak  . Tremor   . Urinary incontinence   . Vertigo     Patient's surgical history, family medical history, social history, medications and allergies were all  reviewed in Epic   Creatinine clearance cannot be calculated (Patient's most recent lab result is older than the maximum 21 days allowed.)  Current Outpatient Medications  Medication Sig Dispense Refill  . albuterol (PROVENTIL HFA;VENTOLIN HFA) 108 (90 Base) MCG/ACT inhaler Inhale 2 puffs into the lungs every 6 (six) hours as needed for wheezing or shortness of breath. 1 Inhaler 2  . amLODipine (NORVASC) 5 MG tablet Take 5 mg by mouth 2 (two) times daily.     Marland Kitchen atorvastatin (LIPITOR) 10 MG tablet Take 10 mg by mouth daily.     . clonazePAM (KLONOPIN) 1 MG tablet Take by mouth. Takes 0.5mg  BID and 1 tablet at bedtime.    .  fluticasone (FLONASE) 50 MCG/ACT nasal spray Place 1 spray into both nostrils every evening.     Marland Kitchen guaiFENesin-dextromethorphan (ROBITUSSIN DM) 100-10 MG/5ML syrup Take 5 mLs by mouth every 4 (four) hours as needed for cough. 118 mL 0  . levothyroxine (SYNTHROID, LEVOTHROID) 50 MCG tablet Take 50 mcg by mouth daily before breakfast.     . Loperamide HCl (IMODIUM PO) Take 1 tablet by mouth daily as needed (diarrhea).    . montelukast (SINGULAIR) 10 MG tablet Take 10 mg by mouth at bedtime.    . naproxen sodium (ANAPROX) 220 MG tablet Take 440 mg by mouth 2 (two) times daily as needed (pain).     . ranitidine (ZANTAC) 150 MG tablet Take 150 mg by mouth 2 (two) times daily.     No current facility-administered medications for this visit.     Physical Exam:     BP 124/80   Pulse 67   Ht 5\' 2"  (1.575 m)   Wt 237 lb (107.5 kg)   BMI 43.35 kg/m   GENERAL:  Pleasant, deconditioned female in NAD PSYCH: : Cooperative, normal affect EENT:  conjunctiva pink, mucous membranes moist, neck supple without masses CARDIAC:  RRR, no peripheral edema PULM: Normal respiratory effort, lungs CTA bilaterally, no wheezing ABDOMEN:  Nondistended, soft, nontender. No obvious masses, no hepatomegaly,  normal bowel sounds RECTAL: patulous anal sphincter, weak squeezing pressures. No evidence for prolapse when bearing down. Internal hemorrhoids on anoscopy.  SKIN:  turgor, no lesions seen Musculoskeletal:  Normal muscle tone, normal strength NEURO: Alert and oriented x 3, no focal neurologic deficits   Tye Savoy , NP 05/13/2018, 2:36 PM   Cc: Smith Robert, MD

## 2018-05-14 ENCOUNTER — Institutional Professional Consult (permissible substitution): Payer: Medicare Other | Admitting: Neurology

## 2018-05-15 ENCOUNTER — Encounter: Payer: Self-pay | Admitting: Nurse Practitioner

## 2018-05-18 ENCOUNTER — Other Ambulatory Visit: Payer: Self-pay | Admitting: Family Medicine

## 2018-05-18 ENCOUNTER — Other Ambulatory Visit: Payer: Self-pay

## 2018-05-18 DIAGNOSIS — N644 Mastodynia: Secondary | ICD-10-CM

## 2018-05-21 ENCOUNTER — Ambulatory Visit
Admission: RE | Admit: 2018-05-21 | Discharge: 2018-05-21 | Disposition: A | Discharge status: Home / Self Care | Payer: Medicare Other | Source: Ambulatory Visit | Attending: Family Medicine | Admitting: Family Medicine

## 2018-05-21 ENCOUNTER — Other Ambulatory Visit: Payer: Self-pay | Admitting: Family Medicine

## 2018-05-21 DIAGNOSIS — N644 Mastodynia: Secondary | ICD-10-CM

## 2018-05-21 DIAGNOSIS — R928 Other abnormal and inconclusive findings on diagnostic imaging of breast: Secondary | ICD-10-CM

## 2018-05-21 DIAGNOSIS — R599 Enlarged lymph nodes, unspecified: Secondary | ICD-10-CM

## 2018-05-21 DIAGNOSIS — N6310 Unspecified lump in the right breast, unspecified quadrant: Secondary | ICD-10-CM

## 2018-05-21 HISTORY — DX: Personal history of irradiation: Z92.3

## 2018-06-01 ENCOUNTER — Ambulatory Visit
Admission: RE | Admit: 2018-06-01 | Discharge: 2018-06-01 | Disposition: A | Discharge status: Home / Self Care | Payer: Medicare Other | Source: Ambulatory Visit | Attending: Family Medicine | Admitting: Family Medicine

## 2018-06-01 ENCOUNTER — Other Ambulatory Visit: Payer: Self-pay | Admitting: Family Medicine

## 2018-06-01 DIAGNOSIS — N6310 Unspecified lump in the right breast, unspecified quadrant: Secondary | ICD-10-CM

## 2018-06-01 DIAGNOSIS — R599 Enlarged lymph nodes, unspecified: Secondary | ICD-10-CM

## 2018-06-02 ENCOUNTER — Ambulatory Visit
Admission: RE | Admit: 2018-06-02 | Discharge: 2018-06-02 | Disposition: A | Discharge status: Home / Self Care | Payer: Medicare Other | Source: Ambulatory Visit | Attending: Family Medicine | Admitting: Family Medicine

## 2018-06-09 ENCOUNTER — Encounter: Payer: Self-pay | Admitting: Internal Medicine

## 2018-06-15 ENCOUNTER — Other Ambulatory Visit: Payer: Self-pay | Admitting: General Surgery

## 2018-06-15 DIAGNOSIS — C50411 Malignant neoplasm of upper-outer quadrant of right female breast: Secondary | ICD-10-CM

## 2018-06-15 DIAGNOSIS — Z170 Estrogen receptor positive status [ER+]: Principal | ICD-10-CM

## 2018-06-17 ENCOUNTER — Telehealth: Payer: Self-pay | Admitting: Hematology and Oncology

## 2018-06-17 ENCOUNTER — Inpatient Hospital Stay: Payer: Medicare Other | Attending: Hematology and Oncology | Admitting: Hematology and Oncology

## 2018-06-17 DIAGNOSIS — Z170 Estrogen receptor positive status [ER+]: Secondary | ICD-10-CM

## 2018-06-17 DIAGNOSIS — G8929 Other chronic pain: Secondary | ICD-10-CM | POA: Insufficient documentation

## 2018-06-17 DIAGNOSIS — C773 Secondary and unspecified malignant neoplasm of axilla and upper limb lymph nodes: Secondary | ICD-10-CM | POA: Diagnosis not present

## 2018-06-17 DIAGNOSIS — Z801 Family history of malignant neoplasm of trachea, bronchus and lung: Secondary | ICD-10-CM | POA: Insufficient documentation

## 2018-06-17 DIAGNOSIS — Z79899 Other long term (current) drug therapy: Secondary | ICD-10-CM | POA: Diagnosis not present

## 2018-06-17 DIAGNOSIS — C50411 Malignant neoplasm of upper-outer quadrant of right female breast: Secondary | ICD-10-CM

## 2018-06-17 DIAGNOSIS — Z853 Personal history of malignant neoplasm of breast: Secondary | ICD-10-CM | POA: Insufficient documentation

## 2018-06-17 DIAGNOSIS — M545 Low back pain: Secondary | ICD-10-CM | POA: Insufficient documentation

## 2018-06-17 NOTE — Telephone Encounter (Signed)
New referral received from Dr. Barry Dienes for breast cancer. Pt has been cld and scheduled to see Dr. Lindi Adie today at 3pm. Pt has been made aware to arrive 30 minutes early.

## 2018-06-17 NOTE — Telephone Encounter (Signed)
Gave avs and calendar ° °

## 2018-06-17 NOTE — Assessment & Plan Note (Addendum)
06/01/2018:Prior history of left breast cancer. right breast irregular mass 2 x 1.4 x 2.6 cm ultrasound revealed 3 suspicious lymph nodes largest measuring 2.5 cm.  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 After reviewing the pathology in detail, we proceeded to discuss the different treatment options between surgery, radiation, chemotherapy, antiestrogen therapies.  Recommendation: 1.  Breast conserving surgery with targeted node dissection 2. Mammaprint testing to determine if she would benefit from chemo 3.  Followed by radiation 4.  Followed by antiestrogen therapy  Return to clinic after surgery to discuss pathology report

## 2018-06-17 NOTE — Progress Notes (Signed)
[place] NOTE  Patient Care Team: London Pepper, MD as PCP - General (Family Medicine) Alvester Chou, NP (Nurse Practitioner)  CHIEF COMPLAINTS/PURPOSE OF CONSULTATION:  Newly diagnosed breast cancer  HISTORY OF PRESENTING ILLNESS:  [name] 72 y.o. female is here because of recent diagnosis of right breast cancer.  Patient has a history of left breast cancer treated with lumpectomy radiation and refused antiestrogen therapy.  She initially had a palpable abnormality in the left breast but it turned out to be benign.  On mammogram and ultrasound she had a right breast cancer.  It measured 2.6 cm and a biopsy revealed IDC grade 3 with DCIS that is ER PR positive HER-2 negative with a Ki-67 of 50%.  Biopsy of the axillary lymph node was positive for breast cancer as well.  Apparently there was no lymph nodal tissue identified and it could be completely cancer replaced lymph node. Patient complains of chronic low back pain.  Previously she has had multiple surgeries on the back.  She is worried about metastatic disease to the back.  I reviewed her records extensively and collaborated the history with the patient.  SUMMARY OF ONCOLOGIC HISTORY:   Breast cancer of upper-outer quadrant of left female breast ([place])   08/16/2013 Initial Diagnosis    left breast invasive ductal carcinomawith DCIS, grade 1, ER/PR positive HER-2 negative, MRI 08/31/2013 7 mm enhancement, right breast biopsy benign    09/29/2013 Surgery    left breast lumpectomy: Invasive ductal carcinoma 0.9 cm margins negative with DCIS and ALH, 2 sentinel nodes negative, T1 BN 0M0 stage Ia    10/25/2013 - 11/19/2013 Radiation Therapy    Adjuvant radiation therapy     Anti-estrogen oral therapy    Refused antiestrogen therapy     Malignant neoplasm of upper-outer quadrant of right breast in female, estrogen receptor positive ([place])   06/01/2018 Initial Diagnosis    Prior history of left breast cancer.  right breast irregular mass 2 x 1.4 x 2.6 cm ultrasound revealed 3 suspicious lymph nodes largest measuring 2.5 cm.  Biopsy 9:30 position: Grade 3 IDC with DCIS ER 100%, PR 90%, Ki-67 50%, HER-2 -1+ by IHC lymph node biopsy: Positive, T2N1    06/17/2018 Cancer Staging    Staging form: Breast, AJCC 8th Edition - Clinical: Stage IIB (cT2, cN1, cM0, G3, ER+, PR+, HER2-) - Signed by Nicholas Lose, MD on 06/17/2018    MEDICAL HISTORY:  Past Medical History:  Diagnosis Date  . Anemia   . Anxiety   . Arthritis   . Bladder incontinence   . Bowel incontinence   . Breast cancer ([place]) 08/16/13   left, 1 o'clock  . Chronic cystitis   . Chronic diarrhea   . Complication of anesthesia    hard to wake up  . Depression   . Full dentures   . GERD (gastroesophageal reflux disease)   . High blood pressure   . History of radiation therapy 11/02/13- 12/24/13   left breast 4680 cGy in 26 sessions, left breast boost 1400 cGy in 7 sessions  . HOH (hard of hearing)   . Hx of colonic polyps 12/03/2016  . Hypothyroidism   . Personal history of radiation therapy   . Pneumonia    hx  . Pseudoseizures   . Shortness of breath    when walks  . Sleep apnea    has a cpap-5 hr/ not used in years  . Stroke ([place]) 1980   legs weak  .  Tremor   . Urinary incontinence   . Vertigo     SURGICAL HISTORY: Past Surgical History:  Procedure Laterality Date  . ABDOMINAL HYSTERECTOMY    . BACK SURGERY  2005   lumbar fusion  . BREAST LUMPECTOMY Left 2015  . BREAST LUMPECTOMY WITH NEEDLE LOCALIZATION AND AXILLARY SENTINEL LYMPH NODE BX Left 09/29/2013   Procedure: BREAST LUMPECTOMY WITH NEEDLE LOCALIZATION AND AXILLARY SENTINEL LYMPH NODE BX;  Surgeon: [name], MD;  Location: [facility];  Service: General;  Laterality: Left;  clean wound class  . COLONOSCOPY    . MULTIPLE TOOTH EXTRACTIONS    . ORIF WRIST FRACTURE Left 07/08/2015   Procedure: OPEN REDUCTION INTERNAL FIXATION (ORIF) LEFT WRIST  FRACTURE AND REPAIR AS INDICATED;  Surgeon: Iran Planas, MD;  Location: [facility];  Service: Orthopedics;  Laterality: Left;  . ovarian cysts    . TOTAL HIP ARTHROPLASTY  2005   rt total hip    SOCIAL HISTORY: Social History   Socioeconomic History  . Marital status: Widowed    Spouse name: Not on file  . Number of children: 2  . Years of education: 10th grade  . Highest education level: Not on file  Occupational History  . Occupation: Retired  Scientific laboratory technician  . Financial resource strain: Not on file  . Food insecurity:    Worry: Not on file    Inability: Not on file  . Transportation needs:    Medical: Not on file    Non-medical: Not on file  Tobacco Use  . Smoking status: Never Smoker  . Smokeless tobacco: Never Used  Substance and Sexual Activity  . Alcohol use: No  . Drug use: No  . Sexual activity: Not Currently    Comment: menarche age 64, first live birth age 40, P67,  contraception x 40 years, menopause 61  Lifestyle  . Physical activity:    Days per week: Not on file    Minutes per session: Not on file  . Stress: Not on file  Relationships  . Social connections:    Talks on phone: Not on file    Gets together: Not on file    Attends religious service: Not on file    Active member of club or organization: Not on file    Attends meetings of clubs or organizations: Not on file    Relationship status: Not on file  . Intimate partner violence:    Fear of current or ex partner: Not on file    Emotionally abused: Not on file    Physically abused: Not on file    Forced sexual activity: Not on file  Other Topics Concern  . Not on file  Social History Narrative   Lives at home alone.   Right-handed.   1 cup caffeine per day.    FAMILY HISTORY: Family History  Problem Relation Age of Onset  . Lung cancer Brother   . Diabetes Brother   . Stroke Mother   . Ataxia Neg Hx   . Chorea Neg Hx   . Dementia Neg Hx   . Mental retardation Neg Hx   . Migraines Neg  Hx   . Multiple sclerosis Neg Hx   . Neurofibromatosis Neg Hx   . Neuropathy Neg Hx   . Parkinsonism Neg Hx   . Seizures Neg Hx     ALLERGIES:  is allergic to codeine.  MEDICATIONS:  Current Outpatient Medications  Medication Sig Dispense Refill  . albuterol (PROVENTIL HFA;VENTOLIN  HFA) 108 (90 Base) MCG/ACT inhaler Inhale 2 puffs into the lungs every 6 (six) hours as needed for wheezing or shortness of breath. 1 Inhaler 2  . amLODipine (NORVASC) 5 MG tablet Take 5 mg by mouth 2 (two) times daily.     Marland Kitchen atorvastatin (LIPITOR) 10 MG tablet Take 10 mg by mouth daily.     . clonazePAM (KLONOPIN) 1 MG tablet Take by mouth. Takes 0.69m BID and 1 tablet at bedtime.    . fluticasone (FLONASE) 50 MCG/ACT nasal spray Place 1 spray into both nostrils every evening.     .Marland KitchenguaiFENesin-dextromethorphan (ROBITUSSIN DM) 100-10 MG/5ML syrup Take 5 mLs by mouth every 4 (four) hours as needed for cough. 118 mL 0  . levothyroxine (SYNTHROID, LEVOTHROID) 50 MCG tablet Take 50 mcg by mouth daily before breakfast.     . Loperamide HCl (IMODIUM PO) Take 1 tablet by mouth daily as needed (diarrhea).    . montelukast (SINGULAIR) 10 MG tablet Take 10 mg by mouth at bedtime.    . naproxen sodium (ANAPROX) 220 MG tablet Take 440 mg by mouth 2 (two) times daily as needed (pain).     . ranitidine (ZANTAC) 150 MG tablet Take 150 mg by mouth 2 (two) times daily.     No current facility-administered medications for this visit.     REVIEW OF SYSTEMS:   Constitutional: Denies fevers, chills or abnormal night sweats Eyes: Denies blurriness of vision, double vision or watery eyes Ears, nose, mouth, throat, and face: Denies mucositis or sore throat Respiratory: Denies cough, dyspnea or wheezes Cardiovascular: Denies palpitation, chest discomfort or lower extremity swelling Gastrointestinal:  Denies nausea, heartburn or change in bowel habits Skin: Denies abnormal skin rashes Lymphatics: Denies new lymphadenopathy or  easy bruising Neurological:Denies numbness, tingling or new weaknesses Behavioral/Psych: Mood is stable, no new changes  Breast: Denies any palpable lumps or nodules in the right breast, biopsy changes All other systems were reviewed with the patient and are negative.  PHYSICAL EXAMINATION: ECOG PERFORMANCE STATUS: 1 - Symptomatic but completely ambulatory  Vitals:   06/17/18 1514  BP: 140/78  Pulse: 63  Resp: 16  Temp: 97.9 F (36.6 C)  SpO2: 93%   Filed Weights   06/17/18 1514  Weight: 242 lb 9.6 oz (110 kg)    GENERAL:alert, no distress and comfortable SKIN: skin color, texture, turgor are normal, no rashes or significant lesions EYES: normal, conjunctiva are pink and non-injected, sclera clear OROPHARYNX:no exudate, no erythema and lips, buccal mucosa, and tongue normal  NECK: supple, thyroid normal size, non-tender, without nodularity LYMPH:  no palpable lymphadenopathy in the cervical, axillary or inguinal LUNGS: clear to auscultation and percussion with normal breathing effort HEART: regular rate & rhythm and no murmurs and no lower extremity edema ABDOMEN:abdomen soft, non-tender and normal bowel sounds Musculoskeletal:no cyanosis of digits and no clubbing  PSYCH: alert & oriented x 3 with fluent speech NEURO: no focal motor/sensory deficits BREAST: Prior left lumpectomy, biopsy changes right breast.  No palpable axillary or supraclavicular lymphadenopathy (exam performed in the presence of a chaperone)   LABORATORY DATA:  I have reviewed the data as listed Lab Results  Component Value Date   WBC 12.3 (H) 12/02/2017   HGB 13.8 12/02/2017   HCT 44.3 12/02/2017   MCV 100.7 (H) 12/02/2017   PLT 136 (L) 12/02/2017   Lab Results  Component Value Date   NA 141 12/02/2017   K 4.1 12/02/2017   CL 107 12/02/2017   CO2  26 12/02/2017    RADIOGRAPHIC STUDIES: I have personally reviewed the radiological reports and agreed with the findings in the  report.  ASSESSMENT AND PLAN:  Malignant neoplasm of upper-outer quadrant of right breast in female, estrogen receptor positive ([place]) 06/01/2018:Prior history of left breast cancer. right breast irregular mass 2 x 1.4 x 2.6 cm ultrasound revealed 3 suspicious lymph nodes largest measuring 2.5 cm.  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 After reviewing the pathology in detail, we proceeded to discuss the different treatment options between surgery, radiation, chemotherapy, antiestrogen therapies.  Recommendation: 1.  Breast conserving surgery with axillary lymph node dissection 2. because the patient is relatively frail with multiple medical conditions, I do not recommend Mammaprint testing because she is not a candidate for chemotherapy. 3.  Followed by radiation 4.  Followed by antiestrogen therapy  Return to clinic after surgery to discuss pathology report  All questions were answered. The patient knows to call the clinic with any problems, questions or concerns.    Harriette Ohara, MD 06/17/18

## 2018-06-18 ENCOUNTER — Encounter: Payer: Self-pay | Admitting: *Deleted

## 2018-06-19 ENCOUNTER — Encounter: Payer: Self-pay | Admitting: Radiation Oncology

## 2018-06-24 ENCOUNTER — Ambulatory Visit
Admission: RE | Admit: 2018-06-24 | Discharge: 2018-06-24 | Disposition: A | Discharge status: Home / Self Care | Payer: Medicare Other | Source: Ambulatory Visit | Attending: Radiation Oncology | Admitting: Radiation Oncology

## 2018-06-24 ENCOUNTER — Ambulatory Visit: Payer: Medicare Other

## 2018-06-24 NOTE — Pre-Procedure Instructions (Signed)
[name]  06/24/2018      [id number] [place], [place] - [address] [place] [address] Phone: [phone] Fax: [phone]    Your procedure is scheduled on Thursday November 7.  Report to [facility] Admitting at 5:30 A.M.  Call this number if you have problems the morning of surgery:  [phone]   Remember:  Do not eat or drink after midnight.  You may drink clear liquids until 4:30AM .  Clear liquids allowed are:  Water, Juice (non-citric and without pulp), Black Coffee only and Gatorade    Take these medicines the morning of surgery with A SIP OF WATER:   Amlodipine (norvasc) Levothyroxine (synthroid) Ranitidine (Zantac) Albuterol inhaler if needed (please bring to the hospital with you)  7 days prior to surgery STOP taking any Aspirin(unless otherwise instructed by your surgeon), Aleve, Naproxen, Ibuprofen, Motrin, Advil, Goody's, BC's, all herbal medications, fish oil, and all vitamins     Do not wear jewelry, make-up or nail polish.  Do not wear lotions, powders, or perfumes, or deodorant.  Do not shave 48 hours prior to surgery.  Men may shave face and neck.  Do not bring valuables to the hospital.  [facility] is not responsible for any belongings or valuables.  Contacts, dentures or bridgework may not be worn into surgery.  Leave your suitcase in the car.  After surgery it may be brought to your room.  For patients admitted to the hospital, discharge time will be determined by your treatment team.  Patients discharged the day of surgery will not be allowed to drive home.   Special instructions:    [facility]- Preparing For Surgery  Before surgery, you can play an important role. Because skin is not sterile, your skin needs to be as free of germs as possible. You can reduce the number of germs on your skin by washing with CHG (chlorahexidine gluconate) Soap  before surgery.  CHG is an antiseptic cleaner which kills germs and bonds with the skin to continue killing germs even after washing.    Oral Hygiene is also important to reduce your risk of infection.  Remember - BRUSH YOUR TEETH THE MORNING OF SURGERY WITH YOUR REGULAR TOOTHPASTE  Please do not use if you have an allergy to CHG or antibacterial soaps. If your skin becomes reddened/irritated stop using the CHG.  Do not shave (including legs and underarms) for at least 48 hours prior to first CHG shower. It is OK to shave your face.  Please follow these instructions carefully.   1. Shower the NIGHT BEFORE SURGERY and the MORNING OF SURGERY with CHG.   2. If you chose to wash your hair, wash your hair first as usual with your normal shampoo.  3. After you shampoo, rinse your hair and body thoroughly to remove the shampoo.  4. Use CHG as you would any other liquid soap. You can apply CHG directly to the skin and wash gently with a scrungie or a clean washcloth.   5. Apply the CHG Soap to your body ONLY FROM THE NECK DOWN.  Do not use on open wounds or open sores. Avoid contact with your eyes, ears, mouth and genitals (private parts). Wash Face and genitals (private parts)  with your normal soap.  6. Wash thoroughly, paying special attention to the area where your surgery will be performed.  7. Thoroughly rinse your body  with warm water from the neck down.  8. DO NOT shower/wash with your normal soap after using and rinsing off the CHG Soap.  9. Pat yourself dry with a CLEAN TOWEL.  10. Wear CLEAN PAJAMAS to bed the night before surgery, wear comfortable clothes the morning of surgery  11. Place CLEAN SHEETS on your bed the night of your first shower and DO NOT SLEEP WITH PETS.    Day of Surgery:  Do not apply any deodorants/lotions.  Please wear clean clothes to the hospital/surgery center.   Remember to brush your teeth WITH YOUR REGULAR TOOTHPASTE.    Please read over the  following fact sheets that you were given. Coughing and Deep Breathing and Surgical Site Infection Prevention

## 2018-06-25 ENCOUNTER — Other Ambulatory Visit: Payer: Self-pay

## 2018-06-25 ENCOUNTER — Encounter (HOSPITAL_COMMUNITY): Payer: Self-pay

## 2018-06-25 ENCOUNTER — Encounter (HOSPITAL_COMMUNITY)
Admission: RE | Admit: 2018-06-25 | Discharge: 2018-06-25 | Disposition: A | Discharge status: Home / Self Care | Payer: Medicare Other | Source: Ambulatory Visit | Attending: General Surgery | Admitting: General Surgery

## 2018-06-25 DIAGNOSIS — G4733 Obstructive sleep apnea (adult) (pediatric): Secondary | ICD-10-CM | POA: Diagnosis not present

## 2018-06-25 DIAGNOSIS — Z79899 Other long term (current) drug therapy: Secondary | ICD-10-CM | POA: Insufficient documentation

## 2018-06-25 DIAGNOSIS — C50911 Malignant neoplasm of unspecified site of right female breast: Secondary | ICD-10-CM | POA: Diagnosis not present

## 2018-06-25 DIAGNOSIS — Z8673 Personal history of transient ischemic attack (TIA), and cerebral infarction without residual deficits: Secondary | ICD-10-CM | POA: Insufficient documentation

## 2018-06-25 DIAGNOSIS — K219 Gastro-esophageal reflux disease without esophagitis: Secondary | ICD-10-CM | POA: Insufficient documentation

## 2018-06-25 DIAGNOSIS — Z01812 Encounter for preprocedural laboratory examination: Secondary | ICD-10-CM | POA: Insufficient documentation

## 2018-06-25 LAB — BASIC METABOLIC PANEL
Anion gap: 6 (ref 5–15)
BUN: 20 mg/dL (ref 8–23)
CALCIUM: 8.7 mg/dL — AB (ref 8.9–10.3)
CO2: 29 mmol/L (ref 22–32)
Chloride: 104 mmol/L (ref 98–111)
Creatinine, Ser: 1.63 mg/dL — ABNORMAL HIGH (ref 0.44–1.00)
GFR, EST AFRICAN AMERICAN: 35 mL/min — AB (ref >60)
GFR, EST NON AFRICAN AMERICAN: 30 mL/min — AB (ref >60)
Glucose, Bld: 121 mg/dL — ABNORMAL HIGH (ref 70–99)
Potassium: 4.2 mmol/L (ref 3.5–5.1)
Sodium: 139 mmol/L (ref 135–145)

## 2018-06-25 LAB — CBC
HCT: 47.8 % — ABNORMAL HIGH (ref 36.0–46.0)
Hemoglobin: 14.7 g/dL (ref 12.0–15.0)
MCH: 30.2 pg (ref 26.0–34.0)
MCHC: 30.8 g/dL (ref 30.0–36.0)
MCV: 98.2 fL (ref 80.0–100.0)
NRBC: 0 % (ref 0.0–0.2)
PLATELETS: 142 10*3/uL — AB (ref 150–400)
RBC: 4.87 MIL/uL (ref 3.87–5.11)
RDW: 14 % (ref 11.5–15.5)
WBC: 5.3 10*3/uL (ref 4.0–10.5)

## 2018-06-25 MED ORDER — CHLORHEXIDINE GLUCONATE CLOTH 2 % EX PADS: 6.0000 | MEDICATED_PAD | Freq: Once | CUTANEOUS | Status: DC

## 2018-06-25 NOTE — Progress Notes (Addendum)
PCP:  London Pepper, MD  Cardiologist: Jyl Heinz. MD  EKG: 11/30/17 in [facility]  Stress test: 11/10/17 in EP[facility]  ECHO: 11/10/17 in EPIC  Cardiac Cath: pt denies  Chest x-ray: 11/29/17 in [facility]

## 2018-06-25 NOTE — Progress Notes (Signed)
[name]            06/24/2018                          [id number] - [place], [place] [address] [place] [address] Phone: [phone] Fax: [phone]              Your procedure is scheduled on Thursday November 7.            Report to [facility] Admitting at 5:30 AM.            Call this number if you have problems the morning of surgery:            [phone]             Remember:            Do not eat or drink after midnight.            You may drink clear liquids until 4:30AM .  Clear liquids allowed are:  Water, Juice (non-citric and without pulp), Black Coffee only and Gatorade                        Take these medicines the morning of surgery with A SIP OF WATER:  Atenolol (Tenormin) Amlodipine (norvasc) Levothyroxine (synthroid) Ranitidine (Zantac) Clonazepam (klonopin)-if needed Albuterol inhaler if needed (please bring to the hospital with you) Flonase nasal spray-if needed  7 days prior to surgery STOP taking any Aspirin(unless otherwise instructed by your surgeon), Aleve, Naproxen, Ibuprofen, Motrin, Advil, Goody's, BC's, all herbal medications, fish oil, and all vitamins                         Do not wear jewelry, make-up or nail polish.            Do not wear lotions, powders, or perfumes, or deodorant.            Do not shave 48 hours prior to surgery.              Do not bring valuables to the hospital.            [facility] is not responsible for any belongings or valuables.  Contacts, dentures or bridgework may not be worn into surgery.  Leave your suitcase in the car.  After surgery it may be brought to your room.  For patients admitted to the hospital, discharge time will be determined by your treatment team.  Patients discharged the day of surgery will not be allowed to drive home.   Special instructions:    [place]-  Preparing For Surgery  Before surgery, you can play an important role. Because skin is not sterile, your skin needs to be as free of germs as possible. You can reduce the number of germs on your skin by washing with CHG (chlorahexidine gluconate) Soap before surgery.  CHG is an antiseptic cleaner which kills germs and bonds with the skin to continue killing germs even after washing.    Oral Hygiene is also important to reduce your risk of infection.  Remember - BRUSH YOUR TEETH THE MORNING OF SURGERY WITH YOUR REGULAR TOOTHPASTE  Please do not use if you have an allergy to CHG or antibacterial soaps. If your skin becomes reddened/irritated stop using  the CHG.  Do not shave (including legs and underarms) for at least 48 hours prior to first CHG shower. It is OK to shave your face.  Please follow these instructions carefully.                                                                                                                     1. Shower the NIGHT BEFORE SURGERY and the MORNING OF SURGERY with CHG.   2. If you chose to wash your hair, wash your hair first as usual with your normal shampoo.  3. After you shampoo, rinse your hair and body thoroughly to remove the shampoo.  4. Use CHG as you would any other liquid soap. You can apply CHG directly to the skin and wash gently with a scrungie or a clean washcloth.   5. Apply the CHG Soap to your body ONLY FROM THE NECK DOWN.  Do not use on open wounds or open sores. Avoid contact with your eyes, ears, mouth and genitals (private parts). Wash Face and genitals (private parts)  with your normal soap.  6. Wash thoroughly, paying special attention to the area where your surgery will be performed.  7. Thoroughly rinse your body with warm water from the neck down.  8. DO NOT shower/wash with your normal soap after using and rinsing off the CHG Soap.  9. Pat yourself dry with a CLEAN TOWEL.  10. Wear CLEAN PAJAMAS to bed the  night before surgery, wear comfortable clothes the morning of surgery  11. Place CLEAN SHEETS on your bed the night of your first shower and DO NOT SLEEP WITH PETS.   Day of Surgery:  Do not apply any deodorants/lotions.  Please wear clean clothes to the hospital/surgery center.   Remember to brush your teeth WITH YOUR REGULAR TOOTHPASTE.  Please read over the following fact sheets that you were given.

## 2018-06-26 ENCOUNTER — Encounter (HOSPITAL_COMMUNITY)
Admission: RE | Admit: 2018-06-26 | Discharge: 2018-06-26 | Disposition: A | Discharge status: Home / Self Care | Payer: Medicare Other | Source: Ambulatory Visit | Attending: Hematology and Oncology | Admitting: Hematology and Oncology

## 2018-06-26 DIAGNOSIS — C50411 Malignant neoplasm of upper-outer quadrant of right female breast: Secondary | ICD-10-CM | POA: Diagnosis not present

## 2018-06-26 DIAGNOSIS — Z170 Estrogen receptor positive status [ER+]: Secondary | ICD-10-CM | POA: Insufficient documentation

## 2018-06-26 MED ORDER — TECHNETIUM TC 99M MEDRONATE IV KIT
20.0000 | PACK | Freq: Once | INTRAVENOUS | Status: AC | PRN
  Administered 2018-06-26: 19 via INTRAVENOUS

## 2018-06-26 NOTE — Anesthesia Preprocedure Evaluation (Addendum)
Anesthesia Evaluation  Patient identified by MRN, date of birth, ID band Patient awake    Reviewed: Allergy & Precautions, NPO status , Patient's Chart, lab work & pertinent test results, reviewed documented beta blocker date and time   History of Anesthesia Complications (+) PROLONGED EMERGENCE and history of anesthetic complications  Airway Mallampati: III  TM Distance: >3 FB Neck ROM: Full    Dental  (+) Upper Dentures, Lower Dentures   Pulmonary shortness of breath and with exertion, sleep apnea , pneumonia, resolved,  Non compliant with CPAP   Pulmonary exam normal breath sounds clear to auscultation       Cardiovascular hypertension, Pt. on medications and Pt. on home beta blockers Normal cardiovascular exam Rhythm:Regular Rate:Normal     Neuro/Psych PSYCHIATRIC DISORDERS Anxiety Depression HOH Legs weak CVA, Residual Symptoms    GI/Hepatic GERD  Medicated,Bowel incontinence   Endo/Other  Hypothyroidism Morbid obesityHyperlipidemia Right Breast Ca Hx/o Left Breast Ca  Renal/GU  Bladder dysfunction  Urinary incontinence    Musculoskeletal  (+) Arthritis , Osteoarthritis,    Abdominal (+) + obese,   Peds  Hematology  (+) anemia ,   Anesthesia Other Findings   Reproductive/Obstetrics                           Anesthesia Physical Anesthesia Plan  ASA: III  Anesthesia Plan: General   Post-op Pain Management:  Regional for Post-op pain   Induction: Intravenous and Cricoid pressure planned  PONV Risk Score and Plan: Ondansetron, Dexamethasone and Treatment may vary due to age or medical condition  Airway Management Planned: LMA  Additional Equipment:   Intra-op Plan:   Post-operative Plan: Extubation in OR  Informed Consent: I have reviewed the patients History and Physical, chart, labs and discussed the procedure including the risks, benefits and alternatives for the  proposed anesthesia with the patient or authorized representative who has indicated his/her understanding and acceptance.   Dental advisory given  Plan Discussed with: CRNA and Surgeon  Anesthesia Plan Comments: (See PAT note 06/26/2018 by Karoline Caldwell, PA-C )       Anesthesia Quick Evaluation

## 2018-06-26 NOTE — Progress Notes (Signed)
Anesthesia Chart Review:  Case:  [id number] Date/Time:  07/02/18 0715   Procedure:  BREAST LUMPECTOMY WITH RADIOACTIVE SEED WITH RIGHT SEED TARGETED LYMPH NODE BIOPSY AND SENTINEL LYMPH NODE BIOPSY (Right Breast)   Anesthesia type:  General   Pre-op diagnosis:  RIGHT BREAST CANCER   Location:  [facility] OR ROOM 02 / [facility]   Surgeon:  [name], MD      DISCUSSION: 72 yo female never smoker. Pertinent hx includes Stroke with residual LE weakness, Pseudoseizures, OSA not on CPAP, Chronic DOE, GERD, Left breast CA s/p lumpectomy and radiation.  Elevated creatinine on PAT labs, 1.63. Trend below:  Ref. Range 07/25/2015 00:42 07/25/2015 05:30 07/26/2015 04:15 07/27/2015 05:41 09/20/2016 16:45 11/28/2017 22:23 12/02/2017 06:00 06/25/2018 14:30  Creatinine Latest Ref Range: 0.44 - 1.00 mg/dL 1.10 (H) 1.17 (H) 0.96 0.99 1.02 (H) 0.85 0.99 1.63 (H)   Labs sent to pt's PCP Dr. Orland Mustard to make him aware. Case discussed with Dr. Kalman Shan, no further workup needed prior to surgery. Anticipate she can proceed as planned barring acute status change.   VS: BP 128/68   Pulse 67   Temp 36.8 C   Resp 18   Ht 5\' 2"  (1.575 m)   Wt 109.6 kg   SpO2 95%   BMI 44.19 kg/m   PROVIDERS: London Pepper, MD is PCP  Jyl Heinz, MD is Cardiologist  LABS: Labs reviewed: Acceptable for surgery. Elevated creatinine. Results routed to PCP and discussed with Dr. Kalman Shan. (all labs ordered are listed, but only abnormal results are displayed)  Labs Reviewed  BASIC METABOLIC PANEL - Abnormal; Notable for the following components:      Result Value   Glucose, Bld 121 (*)    Creatinine, Ser 1.63 (*)    Calcium 8.7 (*)    GFR calc non Af Amer 30 (*)    GFR calc Af Amer 35 (*)    All other components within normal limits  CBC - Abnormal; Notable for the following components:   HCT 47.8 (*)    Platelets 142 (*)    All other components within normal limits     IMAGES: CHEST - 2 VIEW 11/28/2017:  COMPARISON:  07/24/2015  chest radiograph  FINDINGS: Stable cardiomegaly given projection and technique. Aortic atherosclerosis with calcification. Mild reticular opacities of the lungs. No consolidation. No pleural effusion or pneumothorax. No acute osseous abnormality is evident. Surgical clips project over the left axilla.  IMPRESSION: Mild reticular opacities of the lungs may represent interstitial edema or atypical pneumonia. No consolidation. Stable cardiomegaly.   EKG: 11/30/2017: Sinus rhythm. Rate 79. LVH with secondary repol abnormality.  CV: Nuclear stress test 11/10/2017:  Nuclear stress EF: 57%. The left ventricular ejection fraction is normal (55-65%).  The study is normal.  This is a low risk study.    TTE 11/10/2017: Study Conclusions  - Left ventricle: The cavity size was normal. Wall thickness was   increased in a pattern of moderate LVH. Systolic function was   normal. The estimated ejection fraction was in the range of 60%   to 65%. Doppler parameters are consistent with abnormal left   ventricular relaxation (grade 1 diastolic dysfunction). - Aortic valve: Mildly calcified annulus. Moderately thickened   leaflets. There was mild regurgitation.   Past Medical History:  Diagnosis Date  . Anemia   . Anxiety   . Arthritis   . Bladder incontinence   . Bowel incontinence   . Breast cancer ([facility]) 08/16/13   left, 1 o'clock  .  Chronic cystitis   . Chronic diarrhea   . Complication of anesthesia    hard to wake up  . Depression   . Full dentures   . GERD (gastroesophageal reflux disease)   . High blood pressure   . History of radiation therapy 11/02/13- 12/24/13   left breast 4680 cGy in 26 sessions, left breast boost 1400 cGy in 7 sessions  . HOH (hard of hearing)   . Hx of colonic polyps 12/03/2016  . Hypothyroidism   . Personal history of radiation therapy   . Pneumonia    hx  . Pseudoseizures   . Shortness of breath    when walks  . Sleep apnea    has a cpap-5  hr/ not used in years  . Stroke ([place]) 1980   legs weak  . Tremor   . Urinary incontinence   . Vertigo     Past Surgical History:  Procedure Laterality Date  . ABDOMINAL HYSTERECTOMY    . BACK SURGERY  2005   lumbar fusion  . BREAST LUMPECTOMY Left 2015  . BREAST LUMPECTOMY WITH NEEDLE LOCALIZATION AND AXILLARY SENTINEL LYMPH NODE BX Left 09/29/2013   Procedure: BREAST LUMPECTOMY WITH NEEDLE LOCALIZATION AND AXILLARY SENTINEL LYMPH NODE BX;  Surgeon: [name], MD;  Location: [facility];  Service: General;  Laterality: Left;  clean wound class  . COLONOSCOPY    . MULTIPLE TOOTH EXTRACTIONS    . ORIF WRIST FRACTURE Left 07/08/2015   Procedure: OPEN REDUCTION INTERNAL FIXATION (ORIF) LEFT WRIST FRACTURE AND REPAIR AS INDICATED;  Surgeon: Iran Planas, MD;  Location: [facility];  Service: Orthopedics;  Laterality: Left;  . ovarian cysts    . TOTAL HIP ARTHROPLASTY  2005   rt total hip    MEDICATIONS: . albuterol (PROVENTIL HFA;VENTOLIN HFA) 108 (90 Base) MCG/ACT inhaler  . amLODipine (NORVASC) 5 MG tablet  . atenolol (TENORMIN) 25 MG tablet  . clonazePAM (KLONOPIN) 1 MG tablet  . escitalopram (LEXAPRO) 20 MG tablet  . fluticasone (FLONASE) 50 MCG/ACT nasal spray  . guaiFENesin-dextromethorphan (ROBITUSSIN DM) 100-10 MG/5ML syrup  . levothyroxine (SYNTHROID, LEVOTHROID) 50 MCG tablet  . Loperamide HCl (IMODIUM PO)  . montelukast (SINGULAIR) 10 MG tablet  . naproxen sodium (ANAPROX) 220 MG tablet   No current facility-administered medications for this encounter.     Wynonia Musty [facility] Short Stay Center/Anesthesiology Phone [phone] 06/26/2018 2:01 PM

## 2018-06-29 NOTE — H&P (Signed)
[name] Documented: 06/15/2018 9:48 AM Location: [facility] Patient #: [id number] DOB: [birth year] Widowed / Language: [name] / Race: White Female   History of Present Illness Ashley Klein MD; 06/15/2018 5:39 PM) The patient is a 72 year old female who presents with breast cancer. Pt is a 72 yo F who was diagnosed with right breast cancer 05/2018. She presented with concern over possible mass on left near her lumpectomy site. However, on exam, she had a palpable RIGHT breast mass. Dx imaging showed a 2.6 cm mass at 9:30 and a axillary LN that was 2.5 cm. Two other lymph nodes were a little suspicious, but small. 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   She has a history of left breast cancer in 2015. I performed a lumpectomy and SLN bx 09/29/2013. This was grade 1 invasive ductal, 0.9 cm, with DCIS. two SLN were negative and margins were negative. She had adjuvatn radiation. She saw Dr. Humphrey Hoffman at the time. She was supposed to get Arimidex post radiation, but the patient was very concerned about side effects and did not get it.   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 One of these episodes occured post biopsy and was dramatic.   bilateral dx mammogram and ultrasound 05/21/2018 ACR Breast Density Category b: There are scattered areas of fibroglandular density.  FINDINGS: Metallic skin marker was placed on the site of palpable concern in the left breast. Immediately deep to the metallic skin marker are benign changes of fat necrosis at the lumpectomy site, with multiple surgical clips present. No suspicious findings are identified in the region of palpable concern on the mammogram. Additionally, the remainder of the left breast is negative.  In the right breast, there is a new irregular mass with associated pleomorphic calcifications in the middle third.  Mammographic images were  processed with CAD.  On physical exam, I do palpate a firm mass at 9:30 position the right breast approximately 5 cm from the nipple. No right axillary lymphadenopathy is palpated.  Targeted ultrasound is performed, showing a hypoechoic irregular mass with some associated internal vascular flow and multiple echogenic foci consistent with the pleomorphic calcifications. This is at centered at 9:30 position measures approximately 2.0 x 1.4 x 2.6 cm.  Ultrasound of the right axilla shows one markedly abnormal lymph node with a diffusely thickened cortex and probable internal calcifications. This is labeled as axillary lymph node  #3 on the ultrasound images and measures 2.5 x 2.5 x 1.1 cm. There loss of the normal fatty hilum. Two additional suspicious lymph nodes are seen with focal cortical thickening, up to 0.7 cm.  IMPRESSION: Palpable suspicious right breast mass with associated pleomorphic calcifications in the 9:30 position of the right breast. This is highly suspicious for malignancy.  Right axillary lymphadenopathy.  No evidence of malignancy in the left breast. The patient's area of concern corresponds to benign fat necrosis at the lumpectomy site.   Allergies [name], CMA; 06/15/2018 9:49 AM) Codeine Phosphate *ANALGESICS - OPIOID*  Nausea, Vomiting. Allergies Reconciled   Medication History [name], CMA; 06/15/2018 9:49 AM) Atorvastatin Calcium (10MG  Tablet, Oral) Active. Nitroglycerin (0.4MG  Tab Sublingual, Sublingual) Active. AmLODIPine Besylate (5MG  Tablet, Oral qd) Active. Atenolol (50MG  Tablet, Oral qd) Active. CeleXA (40MG  Tablet, Oral qd) Active. KlonoPIN (1MG  Tablet, Oral qd) Active. RadiaPlex (External prn) Active. Synthroid (50MCG Tablet, Oral qd) Active. Naproxen Sodium (220MG  Tablet, Oral qd) Active. Nystatin (  External prn) Active. Zantac 150 Maximum Strength (150MG  Tablet, Oral prn) Active. Medications  Reconciled    Review of Systems Ashley Klein MD; 06/15/2018 5:39 PM) All other systems negative  Vitals [name] CMA; 06/15/2018 9:50 AM) 06/15/2018 9:49 AM Weight: 242 lb Height: 61in Body Surface Area: 2.05 m Body Mass Index: 45.73 kg/m  Temp.: 98.50F(Oral)  Pulse: 70 (Regular)  BP: 128/72 (Sitting, Left Arm, Standard)       Physical Exam Ashley Klein MD; 06/15/2018 5:40 PM) General Note: shaky   Breast Note: breasts similar size. right breast wtih 2 cm mass at 9 o'clock. ? palpable LN low in right axilla. some overlying swelling in skin overlying. no nipple retraction or nipple discharge. Left breast with sl thickening at site of lumpectomy. no LAD on that side.     Assessment & Plan Ashley Klein MD; 06/15/2018 5:43 PM) MALIGNANT NEOPLASM OF UPPER-OUTER QUADRANT OF RIGHT BREAST IN FEMALE, ESTROGEN RECEPTOR POSITIVE (C50.411) Impression: Will plan BCt for this lady given her overall health status. Mass is palpable, but borders are vague. Will get seed placed. Will have seed placed in positive LN as well. Will also do SLN bx.  Will refer to oncology and radiation oncology. I suspect that XRT will be recommended no matter what surgery we do because of the positive node. This cancer does appear to be more aggressive than the previous one. I am not sure she will be candidate for mammaprint/ chemo based on her overall health status, but will let onc make that decision. She would be recommended for antiestrogen treatment regardless.  Will get block as well pre op.  The surgical procedure was described to the patient. I discussed the incision type and location and that we would need radiology involved on with a wire or seed marker and/or sentinel node.  The risks and benefits of the procedure were described to the patient and she wishes to proceed.  We discussed the risks bleeding, infection, damage to other structures, need for further  procedures/surgeries. We discussed the risk of seroma. The patient was advised if the area in the breast in cancer, we may need to go back to surgery for additional tissue to obtain negative margins or for a lymph node biopsy. The patient was advised that these are the most common complications, but that others can occur as well. They were advised against taking aspirin or other anti-inflammatory agents/blood thinners the week before surgery. Current Plans You are being scheduled for surgery- Our schedulers will call you.  You should hear from our office's scheduling department within 5 working days about the location, date, and time of surgery. We try to make accommodations for patient's preferences in scheduling surgery, but sometimes the OR schedule or the surgeon's schedule prevents [place] from making those accommodations.  If you have not heard from our office [phone]) in 5 working days, call the office and ask for your surgeon's nurse.  If you have other questions about your diagnosis, plan, or surgery, call the office and ask for your surgeon's nurse.  Pt Education - flb breast cancer surgery: discussed with patient and provided information.   Signed by Ashley Klein, MD (06/15/2018 5:44 PM)

## 2018-06-30 ENCOUNTER — Ambulatory Visit
Admission: RE | Admit: 2018-06-30 | Discharge: 2018-06-30 | Disposition: A | Discharge status: Home / Self Care | Payer: Medicare Other | Source: Ambulatory Visit | Attending: General Surgery | Admitting: General Surgery

## 2018-06-30 ENCOUNTER — Other Ambulatory Visit: Payer: Self-pay | Admitting: General Surgery

## 2018-06-30 DIAGNOSIS — C50411 Malignant neoplasm of upper-outer quadrant of right female breast: Secondary | ICD-10-CM

## 2018-06-30 DIAGNOSIS — Z170 Estrogen receptor positive status [ER+]: Principal | ICD-10-CM

## 2018-07-02 ENCOUNTER — Ambulatory Visit (HOSPITAL_COMMUNITY): Payer: Medicare Other | Admitting: Physician Assistant

## 2018-07-02 ENCOUNTER — Encounter (HOSPITAL_COMMUNITY)
Admission: RE | Disposition: A | Discharge status: Skilled Nursing Facility | Payer: Self-pay | Source: Ambulatory Visit | Attending: General Surgery

## 2018-07-02 ENCOUNTER — Ambulatory Visit
Admission: RE | Admit: 2018-07-02 | Discharge: 2018-07-02 | Disposition: A | Discharge status: Home / Self Care | Payer: Medicare Other | Source: Ambulatory Visit | Attending: General Surgery | Admitting: General Surgery

## 2018-07-02 ENCOUNTER — Ambulatory Visit (HOSPITAL_COMMUNITY): Payer: Medicare Other | Admitting: Anesthesiology

## 2018-07-02 ENCOUNTER — Observation Stay (HOSPITAL_COMMUNITY)
Admission: RE | Admit: 2018-07-02 | Discharge: 2018-07-07 | Disposition: A | Discharge status: Skilled Nursing Facility | Payer: Medicare Other | Source: Ambulatory Visit | Attending: General Surgery | Admitting: General Surgery

## 2018-07-02 ENCOUNTER — Encounter (HOSPITAL_COMMUNITY): Payer: Self-pay

## 2018-07-02 ENCOUNTER — Ambulatory Visit (HOSPITAL_COMMUNITY)
Admission: RE | Admit: 2018-07-02 | Discharge: 2018-07-02 | Disposition: A | Discharge status: Home / Self Care | Payer: Medicare Other | Source: Ambulatory Visit | Attending: General Surgery | Admitting: General Surgery

## 2018-07-02 ENCOUNTER — Other Ambulatory Visit: Payer: Self-pay

## 2018-07-02 DIAGNOSIS — Z170 Estrogen receptor positive status [ER+]: Principal | ICD-10-CM

## 2018-07-02 DIAGNOSIS — R51 Headache: Secondary | ICD-10-CM | POA: Insufficient documentation

## 2018-07-02 DIAGNOSIS — Z791 Long term (current) use of non-steroidal anti-inflammatories (NSAID): Secondary | ICD-10-CM | POA: Diagnosis not present

## 2018-07-02 DIAGNOSIS — F419 Anxiety disorder, unspecified: Secondary | ICD-10-CM | POA: Diagnosis not present

## 2018-07-02 DIAGNOSIS — K219 Gastro-esophageal reflux disease without esophagitis: Secondary | ICD-10-CM | POA: Diagnosis not present

## 2018-07-02 DIAGNOSIS — C50411 Malignant neoplasm of upper-outer quadrant of right female breast: Secondary | ICD-10-CM

## 2018-07-02 DIAGNOSIS — Z79890 Hormone replacement therapy (postmenopausal): Secondary | ICD-10-CM | POA: Diagnosis not present

## 2018-07-02 DIAGNOSIS — E034 Atrophy of thyroid (acquired): Secondary | ICD-10-CM | POA: Insufficient documentation

## 2018-07-02 DIAGNOSIS — C773 Secondary and unspecified malignant neoplasm of axilla and upper limb lymph nodes: Secondary | ICD-10-CM | POA: Diagnosis not present

## 2018-07-02 DIAGNOSIS — F329 Major depressive disorder, single episode, unspecified: Secondary | ICD-10-CM | POA: Insufficient documentation

## 2018-07-02 DIAGNOSIS — R42 Dizziness and giddiness: Secondary | ICD-10-CM | POA: Insufficient documentation

## 2018-07-02 DIAGNOSIS — I69359 Hemiplegia and hemiparesis following cerebral infarction affecting unspecified side: Secondary | ICD-10-CM | POA: Diagnosis not present

## 2018-07-02 DIAGNOSIS — G4730 Sleep apnea, unspecified: Secondary | ICD-10-CM | POA: Insufficient documentation

## 2018-07-02 DIAGNOSIS — I10 Essential (primary) hypertension: Secondary | ICD-10-CM | POA: Insufficient documentation

## 2018-07-02 DIAGNOSIS — Z79899 Other long term (current) drug therapy: Secondary | ICD-10-CM | POA: Diagnosis not present

## 2018-07-02 DIAGNOSIS — Z6841 Body Mass Index (BMI) 40.0 and over, adult: Secondary | ICD-10-CM | POA: Diagnosis not present

## 2018-07-02 DIAGNOSIS — C50911 Malignant neoplasm of unspecified site of right female breast: Secondary | ICD-10-CM | POA: Diagnosis present

## 2018-07-02 DIAGNOSIS — R0602 Shortness of breath: Secondary | ICD-10-CM

## 2018-07-02 DIAGNOSIS — E785 Hyperlipidemia, unspecified: Secondary | ICD-10-CM | POA: Diagnosis not present

## 2018-07-02 HISTORY — PX: BREAST LUMPECTOMY WITH RADIOACTIVE SEED AND AXILLARY LYMPH NODE DISSECTION: SHX6656

## 2018-07-02 HISTORY — PX: BREAST LUMPECTOMY: SHX2

## 2018-07-02 SURGERY — BREAST LUMPECTOMY WITH RADIOACTIVE SEED AND AXILLARY LYMPH NODE DISSECTION
Anesthesia: General | Site: Breast | Laterality: Right

## 2018-07-02 MED ORDER — PROPOFOL 10 MG/ML IV BOLUS
INTRAVENOUS | Status: DC | PRN
  Administered 2018-07-02: 20 mg via INTRAVENOUS
  Administered 2018-07-02: 100 mg via INTRAVENOUS

## 2018-07-02 MED ORDER — ESCITALOPRAM OXALATE 20 MG PO TABS
20.0000 mg | ORAL_TABLET | Freq: Every day | ORAL | Status: DC
  Administered 2018-07-02 – 2018-07-06 (×5): 20 mg via ORAL
  Filled 2018-07-02 (×5): qty 1

## 2018-07-02 MED ORDER — DIPHENHYDRAMINE HCL 50 MG/ML IJ SOLN: 12.5000 mg | Freq: Four times a day (QID) | INTRAMUSCULAR | Status: DC | PRN

## 2018-07-02 MED ORDER — PHENYLEPHRINE 40 MCG/ML (10ML) SYRINGE FOR IV PUSH (FOR BLOOD PRESSURE SUPPORT)
PREFILLED_SYRINGE | INTRAVENOUS | Status: AC
  Filled 2018-07-02: qty 10

## 2018-07-02 MED ORDER — SODIUM CHLORIDE (PF) 0.9 % IJ SOLN
INTRAMUSCULAR | Status: AC
  Filled 2018-07-02: qty 10

## 2018-07-02 MED ORDER — MONTELUKAST SODIUM 10 MG PO TABS
10.0000 mg | ORAL_TABLET | Freq: Every day | ORAL | Status: DC
  Administered 2018-07-02 – 2018-07-06 (×5): 10 mg via ORAL
  Filled 2018-07-02 (×5): qty 1

## 2018-07-02 MED ORDER — LACTATED RINGERS IV SOLN
INTRAVENOUS | Status: DC | PRN
  Administered 2018-07-02: 08:00:00 via INTRAVENOUS

## 2018-07-02 MED ORDER — ALBUTEROL SULFATE (2.5 MG/3ML) 0.083% IN NEBU
INHALATION_SOLUTION | RESPIRATORY_TRACT | Status: AC
  Administered 2018-07-02: 2.5 mg
  Filled 2018-07-02: qty 3

## 2018-07-02 MED ORDER — ONDANSETRON HCL 4 MG/2ML IJ SOLN
INTRAMUSCULAR | Status: DC | PRN
  Administered 2018-07-02: 4 mg via INTRAVENOUS

## 2018-07-02 MED ORDER — FLUTICASONE PROPIONATE 50 MCG/ACT NA SUSP
1.0000 | Freq: Every day | NASAL | Status: DC | PRN
  Administered 2018-07-05: 1 via NASAL
  Filled 2018-07-02: qty 16

## 2018-07-02 MED ORDER — SODIUM CHLORIDE (PF) 0.9 % IJ SOLN
INTRAVENOUS | Status: DC | PRN
  Administered 2018-07-02: 5 mL via INTRAMUSCULAR

## 2018-07-02 MED ORDER — HYDRALAZINE HCL 20 MG/ML IJ SOLN: 10.0000 mg | INTRAMUSCULAR | Status: DC | PRN

## 2018-07-02 MED ORDER — AMLODIPINE BESYLATE 5 MG PO TABS
5.0000 mg | ORAL_TABLET | Freq: Every day | ORAL | Status: DC
  Administered 2018-07-03 – 2018-07-07 (×5): 5 mg via ORAL
  Filled 2018-07-02 (×5): qty 1

## 2018-07-02 MED ORDER — ONDANSETRON HCL 4 MG/2ML IJ SOLN: 4.0000 mg | Freq: Four times a day (QID) | INTRAMUSCULAR | Status: DC | PRN

## 2018-07-02 MED ORDER — BUPIVACAINE-EPINEPHRINE (PF) 0.25% -1:200000 IJ SOLN
INTRAMUSCULAR | Status: AC
  Filled 2018-07-02: qty 30

## 2018-07-02 MED ORDER — LEVOTHYROXINE SODIUM 50 MCG PO TABS
50.0000 ug | ORAL_TABLET | Freq: Every day | ORAL | Status: DC
  Administered 2018-07-03 – 2018-07-07 (×5): 50 ug via ORAL
  Filled 2018-07-02 (×5): qty 1

## 2018-07-02 MED ORDER — GLYCOPYRROLATE PF 0.2 MG/ML IJ SOSY
PREFILLED_SYRINGE | INTRAMUSCULAR | Status: DC | PRN
  Administered 2018-07-02: .2 mg via INTRAVENOUS

## 2018-07-02 MED ORDER — LIDOCAINE 2% (20 MG/ML) 5 ML SYRINGE
INTRAMUSCULAR | Status: AC
  Filled 2018-07-02: qty 5

## 2018-07-02 MED ORDER — FENTANYL CITRATE (PF) 250 MCG/5ML IJ SOLN
INTRAMUSCULAR | Status: DC | PRN
  Administered 2018-07-02: 50 ug via INTRAVENOUS
  Administered 2018-07-02 (×2): 25 ug via INTRAVENOUS

## 2018-07-02 MED ORDER — SUCCINYLCHOLINE CHLORIDE 200 MG/10ML IV SOSY
PREFILLED_SYRINGE | INTRAVENOUS | Status: AC
  Filled 2018-07-02: qty 10

## 2018-07-02 MED ORDER — ALBUMIN HUMAN 5 % IV SOLN: 12.5000 g | Freq: Once | INTRAVENOUS | Status: DC

## 2018-07-02 MED ORDER — NAPROXEN SODIUM 275 MG PO TABS
275.0000 mg | ORAL_TABLET | Freq: Two times a day (BID) | ORAL | Status: DC | PRN
  Administered 2018-07-03: 275 mg via ORAL
  Filled 2018-07-02 (×2): qty 1

## 2018-07-02 MED ORDER — MIDAZOLAM HCL 5 MG/5ML IJ SOLN
INTRAMUSCULAR | Status: DC | PRN
  Administered 2018-07-02: 1 mg via INTRAVENOUS

## 2018-07-02 MED ORDER — GUAIFENESIN-DM 100-10 MG/5ML PO SYRP: 5.0000 mL | ORAL_SOLUTION | ORAL | Status: DC | PRN

## 2018-07-02 MED ORDER — ONDANSETRON HCL 4 MG/2ML IJ SOLN
INTRAMUSCULAR | Status: AC
  Filled 2018-07-02: qty 2

## 2018-07-02 MED ORDER — LOPERAMIDE HCL 2 MG PO CAPS: 2.0000 mg | ORAL_CAPSULE | Freq: Four times a day (QID) | ORAL | Status: DC | PRN

## 2018-07-02 MED ORDER — CEFAZOLIN SODIUM-DEXTROSE 2-4 GM/100ML-% IV SOLN
2.0000 g | Freq: Three times a day (TID) | INTRAVENOUS | Status: AC
  Administered 2018-07-02: 2 g via INTRAVENOUS
  Filled 2018-07-02: qty 100

## 2018-07-02 MED ORDER — MIDAZOLAM HCL 2 MG/2ML IJ SOLN
INTRAMUSCULAR | Status: AC
  Filled 2018-07-02: qty 2

## 2018-07-02 MED ORDER — 0.9 % SODIUM CHLORIDE (POUR BTL) OPTIME
TOPICAL | Status: DC | PRN
  Administered 2018-07-02: 1000 mL

## 2018-07-02 MED ORDER — TECHNETIUM TC 99M SULFUR COLLOID FILTERED
1.0000 | Freq: Once | INTRAVENOUS | Status: AC | PRN
  Administered 2018-07-02: 1 via INTRADERMAL

## 2018-07-02 MED ORDER — SUGAMMADEX SODIUM 200 MG/2ML IV SOLN
INTRAVENOUS | Status: DC | PRN
  Administered 2018-07-02: 200 mg via INTRAVENOUS

## 2018-07-02 MED ORDER — ALBUTEROL SULFATE HFA 108 (90 BASE) MCG/ACT IN AERS
INHALATION_SPRAY | RESPIRATORY_TRACT | Status: AC
  Filled 2018-07-02: qty 6.7

## 2018-07-02 MED ORDER — CEFAZOLIN SODIUM-DEXTROSE 2-4 GM/100ML-% IV SOLN
2.0000 g | INTRAVENOUS | Status: AC
  Administered 2018-07-02: 2 g via INTRAVENOUS
  Filled 2018-07-02: qty 100

## 2018-07-02 MED ORDER — PROPOFOL 10 MG/ML IV BOLUS
INTRAVENOUS | Status: AC
  Filled 2018-07-02: qty 20

## 2018-07-02 MED ORDER — ACETAMINOPHEN 500 MG PO TABS
1000.0000 mg | ORAL_TABLET | ORAL | Status: AC
  Administered 2018-07-02: 1000 mg via ORAL
  Filled 2018-07-02: qty 2

## 2018-07-02 MED ORDER — TRAMADOL HCL 50 MG PO TABS
50.0000 mg | ORAL_TABLET | Freq: Four times a day (QID) | ORAL | Status: DC | PRN
  Administered 2018-07-04: 50 mg via ORAL
  Filled 2018-07-02: qty 1

## 2018-07-02 MED ORDER — FENTANYL CITRATE (PF) 250 MCG/5ML IJ SOLN
INTRAMUSCULAR | Status: AC
  Filled 2018-07-02: qty 5

## 2018-07-02 MED ORDER — GABAPENTIN 300 MG PO CAPS
300.0000 mg | ORAL_CAPSULE | Freq: Two times a day (BID) | ORAL | Status: DC
  Administered 2018-07-02 (×2): 300 mg via ORAL
  Filled 2018-07-02 (×2): qty 1

## 2018-07-02 MED ORDER — ACETAMINOPHEN 325 MG PO TABS
650.0000 mg | ORAL_TABLET | Freq: Four times a day (QID) | ORAL | Status: DC | PRN
  Administered 2018-07-05 – 2018-07-06 (×2): 650 mg via ORAL
  Filled 2018-07-02 (×2): qty 2

## 2018-07-02 MED ORDER — BUPIVACAINE-EPINEPHRINE (PF) 0.5% -1:200000 IJ SOLN
INTRAMUSCULAR | Status: DC | PRN
  Administered 2018-07-02: 30 mL via PERINEURAL

## 2018-07-02 MED ORDER — ACETAMINOPHEN 650 MG RE SUPP: 650.0000 mg | Freq: Four times a day (QID) | RECTAL | Status: DC | PRN

## 2018-07-02 MED ORDER — LIDOCAINE 2% (20 MG/ML) 5 ML SYRINGE
INTRAMUSCULAR | Status: DC | PRN
  Administered 2018-07-02: 80 mg via INTRAVENOUS

## 2018-07-02 MED ORDER — DEXAMETHASONE SODIUM PHOSPHATE 10 MG/ML IJ SOLN
INTRAMUSCULAR | Status: DC | PRN
  Administered 2018-07-02: 10 mg via INTRAVENOUS

## 2018-07-02 MED ORDER — METHYLENE BLUE 0.5 % INJ SOLN
INTRAVENOUS | Status: AC
  Filled 2018-07-02: qty 10

## 2018-07-02 MED ORDER — ROCURONIUM BROMIDE 10 MG/ML (PF) SYRINGE
PREFILLED_SYRINGE | INTRAVENOUS | Status: DC | PRN
  Administered 2018-07-02: 30 mg via INTRAVENOUS

## 2018-07-02 MED ORDER — ROCURONIUM BROMIDE 50 MG/5ML IV SOSY
PREFILLED_SYRINGE | INTRAVENOUS | Status: AC
  Filled 2018-07-02: qty 5

## 2018-07-02 MED ORDER — DEXAMETHASONE SODIUM PHOSPHATE 10 MG/ML IJ SOLN
INTRAMUSCULAR | Status: AC
  Filled 2018-07-02: qty 1

## 2018-07-02 MED ORDER — ATENOLOL 25 MG PO TABS
25.0000 mg | ORAL_TABLET | Freq: Every day | ORAL | Status: DC
  Administered 2018-07-02 – 2018-07-06 (×5): 25 mg via ORAL
  Filled 2018-07-02 (×5): qty 1

## 2018-07-02 MED ORDER — ONDANSETRON 4 MG PO TBDP: 4.0000 mg | ORAL_TABLET | Freq: Four times a day (QID) | ORAL | Status: DC | PRN

## 2018-07-02 MED ORDER — LIDOCAINE HCL 1 % IJ SOLN
INTRAMUSCULAR | Status: DC | PRN
  Administered 2018-07-02: 40 mL

## 2018-07-02 MED ORDER — DIPHENHYDRAMINE HCL 12.5 MG/5ML PO ELIX
12.5000 mg | ORAL_SOLUTION | Freq: Four times a day (QID) | ORAL | Status: DC | PRN
  Administered 2018-07-06: 12.5 mg via ORAL
  Filled 2018-07-02: qty 10

## 2018-07-02 MED ORDER — LIDOCAINE HCL (PF) 1 % IJ SOLN
INTRAMUSCULAR | Status: AC
  Filled 2018-07-02: qty 30

## 2018-07-02 MED ORDER — OXYCODONE HCL 5 MG PO TABS: 5.0000 mg | ORAL_TABLET | ORAL | Status: DC | PRN

## 2018-07-02 MED ORDER — ALBUTEROL SULFATE (2.5 MG/3ML) 0.083% IN NEBU
2.5000 mg | INHALATION_SOLUTION | Freq: Four times a day (QID) | RESPIRATORY_TRACT | Status: DC | PRN
  Administered 2018-07-05: 2.5 mg via RESPIRATORY_TRACT
  Filled 2018-07-02: qty 3

## 2018-07-02 MED ORDER — CLONAZEPAM 1 MG PO TABS
1.0000 mg | ORAL_TABLET | Freq: Three times a day (TID) | ORAL | Status: DC
  Administered 2018-07-02 – 2018-07-07 (×13): 1 mg via ORAL
  Filled 2018-07-02 (×13): qty 1

## 2018-07-02 MED ORDER — EPHEDRINE 5 MG/ML INJ
INTRAVENOUS | Status: AC
  Filled 2018-07-02: qty 10

## 2018-07-02 MED ORDER — PROPOFOL 10 MG/ML IV BOLUS
INTRAVENOUS | Status: AC
  Filled 2018-07-02: qty 40

## 2018-07-02 MED ORDER — GABAPENTIN 300 MG PO CAPS
300.0000 mg | ORAL_CAPSULE | ORAL | Status: AC
  Administered 2018-07-02: 300 mg via ORAL
  Filled 2018-07-02: qty 1

## 2018-07-02 SURGICAL SUPPLY — 56 items
ADH SKN CLS APL DERMABOND .7 (GAUZE/BANDAGES/DRESSINGS) ×1
BANDAGE COBAN STERILE 2 (GAUZE/BANDAGES/DRESSINGS) ×1 IMPLANT
BINDER BREAST 3XL (GAUZE/BANDAGES/DRESSINGS) ×1 IMPLANT
BINDER BREAST XLRG (GAUZE/BANDAGES/DRESSINGS) ×1 IMPLANT
BNDG COHESIVE 4X5 TAN STRL (GAUZE/BANDAGES/DRESSINGS) ×2 IMPLANT
CANISTER SUCT 3000ML PPV (MISCELLANEOUS) ×2 IMPLANT
CHLORAPREP W/TINT 26ML (MISCELLANEOUS) ×2 IMPLANT
CLIP VESOCCLUDE LG 6/CT (CLIP) ×2 IMPLANT
CLIP VESOCCLUDE MED 6/CT (CLIP) ×3 IMPLANT
CLIP VESOCCLUDE SM WIDE 6/CT (CLIP) ×2 IMPLANT
CONT SPEC 4OZ CLIKSEAL STRL BL (MISCELLANEOUS) ×1 IMPLANT
COVER PROBE W GEL 5X96 (DRAPES) ×2 IMPLANT
COVER SURGICAL LIGHT HANDLE (MISCELLANEOUS) ×2 IMPLANT
COVER WAND RF STERILE (DRAPES) ×2 IMPLANT
DERMABOND ADVANCED (GAUZE/BANDAGES/DRESSINGS) ×1
DERMABOND ADVANCED .7 DNX12 (GAUZE/BANDAGES/DRESSINGS) ×1 IMPLANT
DEVICE DUBIN SPECIMEN MAMMOGRA (MISCELLANEOUS) ×2 IMPLANT
DRAPE CHEST BREAST 15X10 FENES (DRAPES) ×2 IMPLANT
DRAPE UNIVERSAL PACK (DRAPES) ×2 IMPLANT
DRAPE UTILITY XL STRL (DRAPES) ×4 IMPLANT
ELECT COATED BLADE 2.86 ST (ELECTRODE) ×2 IMPLANT
ELECT NDL BLADE 2-5/6 (NEEDLE) ×1 IMPLANT
ELECT NEEDLE BLADE 2-5/6 (NEEDLE) ×2 IMPLANT
ELECT REM PT RETURN 9FT ADLT (ELECTROSURGICAL) ×2
ELECTRODE REM PT RTRN 9FT ADLT (ELECTROSURGICAL) ×1 IMPLANT
GAUZE SPONGE 4X4 12PLY STRL LF (GAUZE/BANDAGES/DRESSINGS) ×1 IMPLANT
GLOVE BIO SURGEON STRL SZ 6 (GLOVE) ×2 IMPLANT
GLOVE INDICATOR 6.5 STRL GRN (GLOVE) ×2 IMPLANT
GOWN STRL REUS W/ TWL LRG LVL3 (GOWN DISPOSABLE) ×1 IMPLANT
GOWN STRL REUS W/TWL 2XL LVL3 (GOWN DISPOSABLE) ×2 IMPLANT
GOWN STRL REUS W/TWL LRG LVL3 (GOWN DISPOSABLE) ×2
KIT BASIN OR (CUSTOM PROCEDURE TRAY) ×2 IMPLANT
KIT MARKER MARGIN INK (KITS) ×2 IMPLANT
LIGHT WAVEGUIDE WIDE FLAT (MISCELLANEOUS) ×1 IMPLANT
NDL FILTER BLUNT 18X1 1/2 (NEEDLE) IMPLANT
NDL HYPO 25GX1X1/2 BEV (NEEDLE) ×1 IMPLANT
NDL SAFETY ECLIPSE 18X1.5 (NEEDLE) IMPLANT
NEEDLE FILTER BLUNT 18X 1/2SAF (NEEDLE)
NEEDLE FILTER BLUNT 18X1 1/2 (NEEDLE) IMPLANT
NEEDLE HYPO 18GX1.5 SHARP (NEEDLE)
NEEDLE HYPO 25GX1X1/2 BEV (NEEDLE) ×2 IMPLANT
NS IRRIG 1000ML POUR BTL (IV SOLUTION) ×2 IMPLANT
PACK SURGICAL SETUP 50X90 (CUSTOM PROCEDURE TRAY) ×2 IMPLANT
PAD ABD 8X10 STRL (GAUZE/BANDAGES/DRESSINGS) ×1 IMPLANT
PENCIL BUTTON HOLSTER BLD 10FT (ELECTRODE) ×2 IMPLANT
SPONGE LAP 18X18 X RAY DECT (DISPOSABLE) ×2 IMPLANT
STOCKINETTE IMPERVIOUS 9X36 MD (GAUZE/BANDAGES/DRESSINGS) ×2 IMPLANT
STRIP CLOSURE SKIN 1/2X4 (GAUZE/BANDAGES/DRESSINGS) ×1 IMPLANT
SUT MNCRL AB 4-0 PS2 18 (SUTURE) ×2 IMPLANT
SUT VIC AB 3-0 SH 8-18 (SUTURE) ×2 IMPLANT
SYR BULB 3OZ (MISCELLANEOUS) ×2 IMPLANT
SYR CONTROL 10ML LL (SYRINGE) ×2 IMPLANT
TOWEL OR 17X24 6PK STRL BLUE (TOWEL DISPOSABLE) ×2 IMPLANT
TOWEL OR 17X26 10 PK STRL BLUE (TOWEL DISPOSABLE) ×2 IMPLANT
TUBE CONNECTING 12X1/4 (SUCTIONS) ×2 IMPLANT
YANKAUER SUCT BULB TIP NO VENT (SUCTIONS) ×2 IMPLANT

## 2018-07-02 NOTE — Anesthesia Postprocedure Evaluation (Signed)
Anesthesia Post Note  Patient: [name]  Procedure(s) Performed: BREAST LUMPECTOMY WITH RADIOACTIVE SEED WITH RIGHT SEED TARGETED LYMPH NODE BIOPSY AND SENTINEL LYMPH NODE BIOPSY (Right Breast)     Patient location during evaluation: PACU Anesthesia Type: General Level of consciousness: awake and alert and oriented Pain management: pain level controlled Vital Signs Assessment: post-procedure vital signs reviewed and stable Respiratory status: spontaneous breathing, nonlabored ventilation, respiratory function stable and patient connected to nasal cannula oxygen Cardiovascular status: blood pressure returned to baseline and stable Postop Assessment: no apparent nausea or vomiting Anesthetic complications: no    Last Vitals:  Vitals:   07/02/18 1110 07/02/18 1126  BP: (!) 163/96 (!) 142/88  Pulse: 77 74  Resp: 16 15  Temp:    SpO2: 92% (!) 85%    Last Pain:  Vitals:   07/02/18 1130  TempSrc:   PainSc: 0-No pain                 Cornesha Radziewicz A.

## 2018-07-02 NOTE — Transfer of Care (Signed)
Immediate Anesthesia Transfer of Care Note  Patient: [name]  Procedure(s) Performed: BREAST LUMPECTOMY WITH RADIOACTIVE SEED WITH RIGHT SEED TARGETED LYMPH NODE BIOPSY AND SENTINEL LYMPH NODE BIOPSY (Right Breast)  Patient Location: PACU  Anesthesia Type:GA combined with regional for post-op pain  Level of Consciousness: awake, alert , oriented and patient cooperative  Airway & Oxygen Therapy: Patient Spontanous Breathing and Patient connected to face mask oxygen  Post-op Assessment: Report given to RN and Post -op Vital signs reviewed and stable  Post vital signs: Reviewed and stable  Last Vitals:  Vitals Value Taken Time  BP 157/113 07/02/2018  9:55 AM  Temp 36.5 C 07/02/2018 10:01 AM  Pulse 81 07/02/2018 10:06 AM  Resp 18 07/02/2018 10:06 AM  SpO2 91 % 07/02/2018 10:06 AM  Vitals shown include unvalidated device data.  Last Pain:  Vitals:   07/02/18 1001  TempSrc:   PainSc: 0-No pain      Patients Stated Pain Goal: 3 (06/02/11 1975)  Complications: No apparent anesthesia complications

## 2018-07-02 NOTE — Anesthesia Procedure Notes (Signed)
Procedure Name: Intubation Date/Time: 07/02/2018 7:54 AM Performed by: Marsa Aris, CRNA Pre-anesthesia Checklist: Patient identified, Emergency Drugs available, Suction available and Patient being monitored Patient Re-evaluated:Patient Re-evaluated prior to induction Oxygen Delivery Method: Circle System Utilized Preoxygenation: Pre-oxygenation with 100% oxygen Induction Type: IV induction Ventilation: Mask ventilation without difficulty Laryngoscope Size: Miller and 2 Grade View: Grade I Tube type: Oral Number of attempts: 1 Airway Equipment and Method: Stylet and Oral airway Placement Confirmation: ETT inserted through vocal cords under direct vision,  positive ETCO2 and breath sounds checked- equal and bilateral Secured at: 7 cm Tube secured with: Tape Dental Injury: Teeth and Oropharynx as per pre-operative assessment

## 2018-07-02 NOTE — Op Note (Signed)
Right Breast Radioactive seed localized lumpectomy, right axillary seed targeted deep lymph node biopsy, sentinel lymph node mapping and biopsy   Indications: This patient presents with history of right breast cancer, lower outer quadrant, cT2N1, grade 3, +/-/-  Pre-operative Diagnosis: right breast cancer  Post-operative Diagnosis: Same  Surgeon: [name]   Anesthesia: General endotracheal anesthesia  ASA Class: 3  Procedure Details  The patient was seen in the Holding Room. The risks, benefits, complications, treatment options, and expected outcomes were discussed with the patient. The possibilities of bleeding, infection, the need for additional procedures, failure to diagnose a condition, and creating a complication requiring transfusion or operation were discussed with the patient. The patient concurred with the proposed plan, giving informed consent.  The site of surgery properly noted/marked. The patient was taken to Operating Room # 2, identified, and the procedure verified as right seed localized Breast Lumpectomy, right axillary seed targeted lymph node biopsy, and right sentinel lymph node biopsy. A Time Out was held and the above information confirmed. The methylene blue was administered in the subareolar location  The right arm, breast, and chest were prepped and draped in standard fashion. The lumpectomy was performed by creating a inferolateral circumareolar incision near the previously placed radioactive seed.  Dissection was carried down to around the point of maximum signal intensity. The cautery was used to perform the dissection.  Hemostasis was achieved with cautery. The edges of the cavity were marked with large clips, with one each medial, lateral, inferior and superior, and two clips posteriorly.   The specimen was inked with the margin marker paint kit.    Specimen radiography confirmed inclusion of the mammographic lesion, the clip, and the seed.  The background signal  in the breast was zero.  The wound was irrigated and closed with 3-0 vicryl in layers and 4-0 monocryl subcuticular suture.    Using a hand-held gamma probe, right axillary sentinel nodes were identified transcutaneously.  An oblique incision was created below the axillary hairline.  Dissection was carried through the clavipectoral fascia. The seed targeted node was located first.  It was large.  It was removed with cautery and clips on lymphovascular channels.   Specimen mammogram confirmed seed and clip placement in the node.  Two deep level two axillary sentinel nodes were removed.  Counts per second were 190 and 0, but the last node was palpable.    The background count was 0 cps.  The wound was irrigated.  Hemostasis was achieved with cautery.  The axillary incision was closed with a 3-0 vicryl deep dermal interrupted sutures and a 4-0 monocryl subcuticular closure.    Sterile dressings were applied. At the end of the operation, all sponge, instrument, and needle counts were correct.  Findings: grossly clear surgical margins and no adenopathy, anterior margin is skin.  Seed targeted node and first lymph node were large and friable.  Third node was also large, but less friable. None of the nodes were blue.    Estimated Blood Loss:  min         Specimens: right breast lumpectomy with seed, right axillary lymph node with seed and two right axillary sentinel lymph nodes.             Complications:  None; patient tolerated the procedure well.         Disposition: PACU - hemodynamically stable.         Condition: stable

## 2018-07-02 NOTE — Interval H&P Note (Signed)
History and Physical Interval Note:  07/02/2018 7:24 AM  [name]  has presented today for surgery, with the diagnosis of RIGHT BREAST CANCER  The various methods of treatment have been discussed with the patient and family. After consideration of risks, benefits and other options for treatment, the patient has consented to  Procedure(s): BREAST LUMPECTOMY WITH RADIOACTIVE SEED WITH RIGHT SEED TARGETED LYMPH NODE BIOPSY AND SENTINEL LYMPH NODE BIOPSY (Right) as a surgical intervention .  The patient's history has been reviewed, patient examined, no change in status, stable for surgery.  I have reviewed the patient's chart and labs.  Questions were answered to the patient's satisfaction.     [name]

## 2018-07-02 NOTE — Discharge Instructions (Signed)
[facility] °Office Phone Number [phone] ° °BREAST BIOPSY/ PARTIAL MASTECTOMY: POST OP INSTRUCTIONS ° °Always review your discharge instruction sheet given to you by the facility where your surgery was performed. ° °IF YOU HAVE DISABILITY OR FAMILY LEAVE FORMS, YOU MUST BRING THEM TO THE OFFICE FOR PROCESSING.  DO NOT GIVE THEM TO YOUR DOCTOR. ° °1. A prescription for pain medication may be given to you upon discharge.  Take your pain medication as prescribed, if needed.  If narcotic pain medicine is not needed, then you may take acetaminophen (Tylenol) or ibuprofen (Advil) as needed. °2. Take your usually prescribed medications unless otherwise directed °3. If you need a refill on your pain medication, please contact your pharmacy.  They will contact our office to request authorization.  Prescriptions will not be filled after 5pm or on week-ends. °4. You should eat very light the first 24 hours after surgery, such as soup, crackers, pudding, etc.  Resume your normal diet the day after surgery. °5. Most patients will experience some swelling and bruising in the breast.  Ice packs and a good support bra will help.  Swelling and bruising can take several days to resolve.  °6. It is common to experience some constipation if taking pain medication after surgery.  Increasing fluid intake and taking a stool softener will usually help or prevent this problem from occurring.  A mild laxative (Milk of Magnesia or Miralax) should be taken according to package directions if there are no bowel movements after 48 hours. °7. Unless discharge instructions indicate otherwise, you may remove your bandages 48 hours after surgery, and you may shower at that time.  You may have steri-strips (small skin tapes) in place directly over the incision.  These strips should be left on the skin for 7-10 days.   Any sutures or staples will be removed at the office during your follow-up visit. °8. ACTIVITIES:  You may resume  regular daily activities (gradually increasing) beginning the next day.  Wearing a good support bra or sports bra (or the breast binder) minimizes pain and swelling.  You may have sexual intercourse when it is comfortable. °a. You may drive when you no longer are taking prescription pain medication, you can comfortably wear a seatbelt, and you can safely maneuver your car and apply brakes. °b. RETURN TO WORK:  __________1 week_______________ °9. You should see your doctor in the office for a follow-up appointment approximately two weeks after your surgery.  Your doctor’s nurse will typically make your follow-up appointment when she calls you with your pathology report.  Expect your pathology report 2-3 business days after your surgery.  You may call to check if you do not hear from us after three days. ° ° °WHEN TO CALL YOUR DOCTOR: °1. Fever over 101.0 °2. Nausea and/or vomiting. °3. Extreme swelling or bruising. °4. Continued bleeding from incision. °5. Increased pain, redness, or drainage from the incision. ° °The clinic staff is available to answer your questions during regular business hours.  Please don’t hesitate to call and ask to speak to one of the nurses for clinical concerns.  If you have a medical emergency, go to the nearest emergency room or call 911.  A surgeon from [facility] is always on call at the hospital. ° °For further questions, please visit [email or web address]  ° °

## 2018-07-02 NOTE — Anesthesia Procedure Notes (Signed)
Anesthesia Regional Block: Pectoralis block   Pre-Anesthetic Checklist: ,, timeout performed, Correct Patient, Correct Site, Correct Laterality, Correct Procedure, Correct Position, site marked, Risks and benefits discussed,  Surgical consent,  Pre-op evaluation,  At surgeon's request and post-op pain management  Laterality: Right  Prep: chloraprep       Needles:  Injection technique: Single-shot      Additional Needles:   Procedures:,,,, ultrasound used (permanent image in chart),,,,  Narrative:  Start time: 07/02/2018 7:10 AM End time: 07/02/2018 7:16 AM Injection made incrementally with aspirations every 5 mL.  Performed by: Personally  Anesthesiologist: Josephine Igo, MD  Additional Notes: Timeout performed. Patient sedated. Relevant anatomy ID'd using [place]. Incremental 2-29ml injection of LA with frequent aspiration. Patient tolerated procedure well.        Right Pectoralis Block

## 2018-07-03 ENCOUNTER — Encounter (HOSPITAL_COMMUNITY): Payer: Self-pay | Admitting: General Surgery

## 2018-07-03 DIAGNOSIS — C50411 Malignant neoplasm of upper-outer quadrant of right female breast: Secondary | ICD-10-CM | POA: Diagnosis not present

## 2018-07-03 LAB — CBC
HCT: 42.8 % (ref 36.0–46.0)
Hemoglobin: 13.2 g/dL (ref 12.0–15.0)
MCH: 31.4 pg (ref 26.0–34.0)
MCHC: 30.8 g/dL (ref 30.0–36.0)
MCV: 101.7 fL — ABNORMAL HIGH (ref 80.0–100.0)
NRBC: 0 % (ref 0.0–0.2)
PLATELETS: 125 10*3/uL — AB (ref 150–400)
RBC: 4.21 MIL/uL (ref 3.87–5.11)
RDW: 14.6 % (ref 11.5–15.5)
WBC: 11.7 10*3/uL — ABNORMAL HIGH (ref 4.0–10.5)

## 2018-07-03 LAB — BASIC METABOLIC PANEL
ANION GAP: 9 (ref 5–15)
BUN: 30 mg/dL — ABNORMAL HIGH (ref 8–23)
CHLORIDE: 105 mmol/L (ref 98–111)
CO2: 25 mmol/L (ref 22–32)
CREATININE: 1.47 mg/dL — AB (ref 0.44–1.00)
Calcium: 8.3 mg/dL — ABNORMAL LOW (ref 8.9–10.3)
GFR calc Af Amer: 40 mL/min — ABNORMAL LOW (ref >60)
GFR, EST NON AFRICAN AMERICAN: 34 mL/min — AB (ref >60)
Glucose, Bld: 127 mg/dL — ABNORMAL HIGH (ref 70–99)
POTASSIUM: 4.7 mmol/L (ref 3.5–5.1)
SODIUM: 139 mmol/L (ref 135–145)

## 2018-07-03 MED ORDER — TRAMADOL HCL 50 MG PO TABS: 50.0000 mg | ORAL_TABLET | Freq: Four times a day (QID) | ORAL | 1 refills | Status: DC | PRN

## 2018-07-03 MED ORDER — ACETAMINOPHEN 325 MG PO TABS: 650.0000 mg | ORAL_TABLET | Freq: Four times a day (QID) | ORAL | 0 refills | Status: AC | PRN

## 2018-07-03 NOTE — Care Management Obs Status (Signed)
[facility] NOTIFICATION   Patient Details  Name: [name] MRN: [id number] Date of Birth: 16-Apr-1946   Medicare Observation Status Notification Given:  Yes    Marilu Favre, RN 07/03/2018, 12:43 PM

## 2018-07-03 NOTE — Evaluation (Signed)
Physical Therapy Evaluation Patient Details Name: [name] MRN: [id number] DOB: 1946/02/26 Today's Date: 07/03/2018   History of Present Illness  Pt is a 72 y/o female with R breast cancer and is s/p R breast lumpectomy with radioactive seed and axillary lymph node dissection. PMH includes L breast cancer s/p lumpectomy.   Clinical Impression  Pt is s/p surgery above with deficits below. Pt lethargic throughout session and reports some dizziness, and feeling like her eyes were crossed. Pt limited in gait tolerance and required seated rest during gait training. Pt requiring min guard to min A for mobility this session. Pt reports living alone and does not have anyone to help at d/c. Feel pt is at increased risk for falls and will need increased assist at d/c. However, if pt's symptoms improve and mobility tolerance/steadiness improves, pt may be able to progress to HHPT. Will continue to follow acutely to maximize functional mobility independence and safety.     Follow Up Recommendations SNF;Supervision/Assistance - 24 hour(may progress to HHPT if symptoms improve )    Equipment Recommendations  Rolling walker with 5" wheels    Recommendations for Other Services OT consult     Precautions / Restrictions Precautions Precautions: Fall Restrictions Weight Bearing Restrictions: No      Mobility  Bed Mobility               General bed mobility comments: In chair upon entry.   Transfers Overall transfer level: Needs assistance Equipment used: Rolling walker (2 wheeled) Transfers: Sit to/from Stand Sit to Stand: Min guard         General transfer comment: Min guard for steadying assist. Cues for safe hand placement.   Ambulation/Gait Ambulation/Gait assistance: Min assist;Min guard Gait Distance (Feet): 50 Feet(X2) Assistive device: Rolling walker (2 wheeled) Gait Pattern/deviations: Step-through pattern;Decreased stride length Gait velocity: Decreased    General  Gait Details: Slow, very shaky gait. Min to min guard for safety. Pt reporting dizziness and that her eyes felt crossed and required seated rest break during gait. Pt falling asleep almost immediately upon return to chair. Oxygen sats at 90-91% on 3L.   Stairs            Wheelchair Mobility    Modified Rankin (Stroke Patients Only)       Balance Overall balance assessment: Needs assistance Sitting-balance support: No upper extremity supported;Feet supported Sitting balance-Leahy Scale: Fair     Standing balance support: Bilateral upper extremity supported;During functional activity Standing balance-Leahy Scale: Poor Standing balance comment: Reliant on BUE support                              Pertinent Vitals/Pain Pain Assessment: 0-10 Pain Score: 7  Pain Location: headache and neckache  Pain Descriptors / Indicators: Headache Pain Intervention(s): Limited activity within patient's tolerance;Monitored during session;Repositioned    Home Living Family/patient expects to be discharged to:: Private residence Living Arrangements: Alone Available Help at Discharge: Family;Available PRN/intermittently Type of Home: Apartment Home Access: Elevator     Home Layout: One level Home Equipment: Toilet riser;Walker - 4 wheels;Cane - single point      Prior Function Level of Independence: Independent with assistive device(s)         Comments: Used cane for ambulation      Hand Dominance   Dominant Hand: Right    Extremity/Trunk Assessment   Upper Extremity Assessment Upper Extremity Assessment: Defer to OT evaluation  Lower Extremity Assessment Lower Extremity Assessment: Generalized weakness    Cervical / Trunk Assessment Cervical / Trunk Assessment: Kyphotic  Communication   Communication: No difficulties  Cognition Arousal/Alertness: Lethargic Behavior During Therapy: WFL for tasks assessed/performed Overall Cognitive Status: No  family/caregiver present to determine baseline cognitive functioning                                 General Comments: Pt falling asleep almost immediately following ambulation. Difficulty finishing thoughts and with word finding as well.       General Comments General comments (skin integrity, edema, etc.): Pt reports she does not feel like she would be able to perform necessary mobility tasks and ADL tasks, and does not have anyone to come stay with her.     Exercises     Assessment/Plan    PT Assessment Patient needs continued PT services  PT Problem List Decreased strength;Decreased mobility;Decreased balance;Decreased activity tolerance;Decreased knowledge of use of DME;Pain       PT Treatment Interventions DME instruction;Gait training;Functional mobility training;Therapeutic activities;Therapeutic exercise;Balance training;Patient/family education    PT Goals (Current goals can be found in the Care Plan section)  Acute Rehab PT Goals Patient Stated Goal: to feel better PT Goal Formulation: With patient Time For Goal Achievement: 07/17/18 Potential to Achieve Goals: Fair    Frequency Min 2X/week   Barriers to discharge Decreased caregiver support      Co-evaluation               AM-PAC PT "6 Clicks" Daily Activity  Outcome Measure Difficulty turning over in bed (including adjusting bedclothes, sheets and blankets)?: A Little Difficulty moving from lying on back to sitting on the side of the bed? : Unable Difficulty sitting down on and standing up from a chair with arms (e.g., wheelchair, bedside commode, etc,.)?: Unable Help needed moving to and from a bed to chair (including a wheelchair)?: A Little Help needed walking in hospital room?: A Little Help needed climbing 3-5 steps with a railing? : A Lot 6 Click Score: 13    End of Session Equipment Utilized During Treatment: Gait belt Activity Tolerance: Treatment limited secondary to medical  complications (Comment);Patient limited by lethargy;Patient limited by fatigue(dizziness) Patient left: in chair;with call bell/phone within reach;with nursing/sitter in room Nurse Communication: Mobility status;Patient requests pain meds PT Visit Diagnosis: Unsteadiness on feet (R26.81);Muscle weakness (generalized) (M62.81)    Time: 0350-0938 PT Time Calculation (min) (ACUTE ONLY): 28 min   Charges:   PT Evaluation $PT Eval Moderate Complexity: 1 Mod PT Treatments $Gait Training: 8-22 mins        Leighton Ruff, PT, DPT  Acute Rehabilitation Services  Pager: [phone] Office: [phone]   Rudean Hitt 07/03/2018, 11:40 AM

## 2018-07-03 NOTE — Progress Notes (Signed)
1 Day Post-Op   Subjective/Chief Complaint: Denies pain, but feels "unsteady and like eyes are crossed."    Objective: Vital signs in last 24 hours: Temp:  [97.7 F (36.5 C)-98.4 F (36.9 C)] 98.4 F (36.9 C) (11/08 0448) Pulse Rate:  [53-81] 60 (11/08 0448) Resp:  [12-18] 14 (11/08 0448) BP: (122-178)/(63-113) 131/65 (11/08 0448) SpO2:  [79 %-96 %] 93 % (11/08 0603) Weight:  [111.1 kg] 111.1 kg (11/07 1242)    Intake/Output from previous day: 11/07 0701 - 11/08 0700 In: 1340 [P.O.:240; I.V.:1000; IV Piggyback:100] Out: 125 [Blood:125] Intake/Output this shift: No intake/output data recorded.  General appearance: alert, cooperative and no distress Resp: breathing comfortably Breasts: no evidence of large hematoma or seroma.  minimal tenderness  Lab Results:  Recent Labs    07/03/18 0811  WBC 11.7*  HGB 13.2  HCT 42.8  PLT 125*   BMET No results for input(s): NA, K, CL, CO2, GLUCOSE, BUN, CREATININE, CALCIUM in the last 72 hours. PT/INR No results for input(s): LABPROT, INR in the last 72 hours. ABG No results for input(s): PHART, HCO3 in the last 72 hours.  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   Mm Breast Surgical Specimen  Result Date: 07/02/2018 CLINICAL DATA:  Patient is post radioactive seed localization subsequent surgical excision of right axillary lymph node. EXAM: SPECIMEN RADIOGRAPH OF THE RIGHT AXILLA COMPARISON:  Previous exam(s). FINDINGS: Status post excision of the right axilla. The radioactive seed and biopsy marker clip are present, completely intact, and were marked for pathology. Results were called to the OR at the time of dictation. IMPRESSION: Specimen radiograph of the right axilla. Electronically Signed   By: Marin Olp M.D.   On: 07/02/2018 09:19   Mm Breast Surgical  Specimen  Result Date: 07/02/2018 CLINICAL DATA:  Patient is post radioactive seed localization subsequent surgical excision right breast. EXAM: SPECIMEN RADIOGRAPH OF THE RIGHT BREAST COMPARISON:  Previous exam(s). FINDINGS: Status post excision of the right breast. The radioactive seed and ribbon shaped biopsy marker clip are present, completely intact, and were marked for pathology. Results were called to the OR at the time of dictation. IMPRESSION: Specimen radiograph of the right breast. Electronically Signed   By: Marin Olp M.D.   On: 07/02/2018 08:48    Anti-infectives: Anti-infectives (From admission, onward)   Start     Dose/Rate Route Frequency Ordered Stop   07/02/18 1600  ceFAZolin (ANCEF) IVPB 2g/100 mL premix     2 g 200 mL/hr over 30 Minutes Intravenous Every 8 hours 07/02/18 1311 07/02/18 1756   07/02/18 0630  ceFAZolin (ANCEF) IVPB 2g/100 mL premix     2 g 200 mL/hr over 30 Minutes Intravenous On call to O.R. 07/02/18 0622 07/02/18 0828      Assessment/Plan: s/p Procedure(s): BREAST LUMPECTOMY WITH RADIOACTIVE SEED WITH RIGHT SEED TARGETED LYMPH NODE BIOPSY AND SENTINEL LYMPH NODE BIOPSY (Right) pt consult for the unsteadiness  Stop gabapentin- ? Adding unsteadiness May need HHPT No evidence of tremor today or pseudosz activity.   LOS: 0 days    [name] 07/03/2018

## 2018-07-03 NOTE — Care Management Note (Signed)
Case Management Note  Patient Details  Name: [name] MRN: [id number] Date of Birth: Jun 03, 1946  Subjective/Objective:                    Action/Plan:  Discussed discharge planning with patient at bedside. Patient lives alone , has no assistance. PT recommendations SNF. Patient willing to discuss SNF with SW. SW consulted  Expected Discharge Date:  07/03/18               Expected Discharge Plan:  [place]  In-House Referral:  Clinical Social Work  Discharge planning Services  CM Consult  Post Acute Care Choice:  Home Health, Durable Medical Equipment Choice offered to:  Patient  DME Arranged:    DME Agency:     HH Arranged:    [place] Agency:     Status of Service:  In process, will continue to follow  If discussed at Long Length of Stay Meetings, dates discussed:    Additional Comments:  Marilu Favre, RN 07/03/2018, 12:36 PM

## 2018-07-04 DIAGNOSIS — C50411 Malignant neoplasm of upper-outer quadrant of right female breast: Secondary | ICD-10-CM | POA: Diagnosis not present

## 2018-07-04 LAB — CBC
HEMATOCRIT: 42.4 % (ref 36.0–46.0)
Hemoglobin: 12.7 g/dL (ref 12.0–15.0)
MCH: 30.8 pg (ref 26.0–34.0)
MCHC: 30 g/dL (ref 30.0–36.0)
MCV: 102.9 fL — AB (ref 80.0–100.0)
Platelets: 116 10*3/uL — ABNORMAL LOW (ref 150–400)
RBC: 4.12 MIL/uL (ref 3.87–5.11)
RDW: 14.9 % (ref 11.5–15.5)
WBC: 7.6 10*3/uL (ref 4.0–10.5)
nRBC: 0 % (ref 0.0–0.2)

## 2018-07-04 LAB — BASIC METABOLIC PANEL
ANION GAP: 5 (ref 5–15)
BUN: 36 mg/dL — AB (ref 8–23)
CHLORIDE: 104 mmol/L (ref 98–111)
CO2: 30 mmol/L (ref 22–32)
Calcium: 8.2 mg/dL — ABNORMAL LOW (ref 8.9–10.3)
Creatinine, Ser: 1.52 mg/dL — ABNORMAL HIGH (ref 0.44–1.00)
GFR calc Af Amer: 38 mL/min — ABNORMAL LOW (ref >60)
GFR, EST NON AFRICAN AMERICAN: 33 mL/min — AB (ref >60)
Glucose, Bld: 107 mg/dL — ABNORMAL HIGH (ref 70–99)
POTASSIUM: 4.5 mmol/L (ref 3.5–5.1)
Sodium: 139 mmol/L (ref 135–145)

## 2018-07-04 MED ORDER — NAPROXEN SODIUM 275 MG PO TABS
550.0000 mg | ORAL_TABLET | Freq: Two times a day (BID) | ORAL | Status: DC | PRN
  Administered 2018-07-04 – 2018-07-07 (×2): 550 mg via ORAL
  Filled 2018-07-04 (×3): qty 2

## 2018-07-04 NOTE — Progress Notes (Signed)
Patient had eventful day. Assisted patient ambulate to toilet twice today. She mentioned numbness on right side where she had surgery. Nurse made aware of head pain and numbness on right side of chest. Patient was given incentive spirometer since having low O2 stat. Patient O2 stat improved from 80 to 97 and will be using her spirometer every 1-2 hours and goal is 10 times to the 1000 mark. Nurse Joann recorded use of spirometer. Mickel Baas Student RN.

## 2018-07-04 NOTE — Evaluation (Signed)
Occupational Therapy Evaluation Patient Details Name: [name] MRN: [id number] DOB: [birth year] Today's Date: 07/04/2018    History of Present Illness Pt is a 72 y/o female with R breast cancer and is s/p R breast lumpectomy with radioactive seed and axillary lymph node dissection. PMH includes L breast cancer s/p lumpectomy.    Clinical Impression   PTA, pt was independent with cane for ADL and functional mobility. She lives alone. She currently requires overall min guard assist for functional mobility and toilet transfers and min assist for LB ADL. Pt presenting with diminished activity tolerance for ADL participation, generalized weakness, and increase in tremors (face and upper extremities) with fatigue impacting her ability to participate in daily occupations at [facility]. She would benefit from continued OT services while admitted to maximize independence and safety with ADL and functional mobility. Recommend short-term SNF placement for continued rehabilitation as pt lives alone and does not have 24/7 support available.     Follow Up Recommendations  Supervision/Assistance - 24 hour;SNF    Equipment Recommendations  None recommended by OT    Recommendations for Other Services       Precautions / Restrictions Precautions Precautions: Fall Restrictions Weight Bearing Restrictions: No      Mobility Bed Mobility Overal bed mobility: Needs Assistance Bed Mobility: Rolling;Sidelying to Sit Rolling: Min guard Sidelying to sit: Min guard       General bed mobility comments: Hands on guarding assist throughout bed mobility. Significant effort noted.   Transfers Overall transfer level: Needs assistance Equipment used: Rolling walker (2 wheeled) Transfers: Sit to/from Stand Sit to Stand: Min guard         General transfer comment: Guarding assist to steady. Cues for safe hand placement. Significant effort.     Balance Overall balance assessment: Needs  assistance Sitting-balance support: No upper extremity supported;Feet supported Sitting balance-Leahy Scale: Fair     Standing balance support: Bilateral upper extremity supported;During functional activity Standing balance-Leahy Scale: Poor Standing balance comment: Reliant on BUE support                            ADL either performed or assessed with clinical judgement   ADL Overall ADL's : Needs assistance/impaired Eating/Feeding: Set up;Sitting   Grooming: Wash/dry hands;Min guard;Standing   Upper Body Bathing: Sitting;Min guard   Lower Body Bathing: Sit to/from stand;Minimal assistance   Upper Body Dressing : Min guard;Sitting   Lower Body Dressing: Minimal assistance;Sit to/from stand   Toilet Transfer: Min guard;Ambulation;RW   Toileting- Architect and Hygiene: Min guard;Sit to/from stand       Functional mobility during ADLs: Min guard;Rolling walker General ADL Comments: Pt with instability and significantly limited activity tolerance for ADL participation.      Vision Patient Visual Report: No change from baseline Vision Assessment?: Vision impaired- to be further tested in functional context Additional Comments: Pt reports that she has been feeling "crosseyed" since surgery. They stopped a medication with resolution of this.      Perception     Praxis      Pertinent Vitals/Pain Pain Assessment: No/denies pain Pain Location: reports headache has improved Pain Intervention(s): Monitored during session     Hand Dominance Right   Extremity/Trunk Assessment Upper Extremity Assessment Upper Extremity Assessment: Generalized weakness   Lower Extremity Assessment Lower Extremity Assessment: Generalized weakness   Cervical / Trunk Assessment Cervical / Trunk Assessment: Kyphotic   Communication Communication Communication: No difficulties  Cognition Arousal/Alertness: Lethargic Behavior During Therapy: WFL for tasks  assessed/performed Overall Cognitive Status: No family/caregiver present to determine baseline cognitive functioning                                 General Comments: Pt lethargic at times and repeating herself. She was able to demonstrate problem solving abilities during session but slow processing noted.    General Comments  Pt reports that she does not have 24/7 assistance available. She states that she can have an aide through insurance for ADL/IADL as well as to address 14 meals/week. However, she is concerned about her ability to function due to poor activity tolerance. Recommending SNF placement but pt is also unsure of this discharge plan. Encouraged her to think about it as a short rehab stint will likely improve her overall independence and strength.     Exercises     Shoulder Instructions      Home Living Family/patient expects to be discharged to:: Private residence Living Arrangements: Alone Available Help at Discharge: Family;Available PRN/intermittently Type of Home: Apartment Home Access: Elevator     Home Layout: One level     Bathroom Shower/Tub: Chief Strategy Officer: Standard     Home Equipment: Toilet riser;Walker - 4 wheels;Cane - single point          Prior Functioning/Environment Level of Independence: Independent with assistive device(s)        Comments: Used cane for ambulation         OT Problem List: Decreased range of motion;Decreased strength;Decreased activity tolerance;Impaired balance (sitting and/or standing);Decreased safety awareness;Decreased knowledge of use of DME or AE;Decreased knowledge of precautions      OT Treatment/Interventions: Self-care/ADL training;Therapeutic exercise;Energy conservation;DME and/or AE instruction;Therapeutic activities;Patient/family education;Balance training    OT Goals(Current goals can be found in the care plan section) Acute Rehab OT Goals Patient Stated Goal: to feel  better OT Goal Formulation: With patient Time For Goal Achievement: 07/18/18 Potential to Achieve Goals: Good ADL Goals Pt Will Perform Grooming: with modified independence;standing Pt Will Perform Upper Body Dressing: with modified independence;sitting Pt Will Perform Lower Body Dressing: with modified independence;sit to/from stand Pt Will Transfer to Toilet: with modified independence;ambulating;regular height toilet Pt Will Perform Toileting - Clothing Manipulation and hygiene: with modified independence;sit to/from stand Additional ADL Goal #1: Pt will verbalize 3 strategies to conserve energy during daily ADL routine with no cues or reminders.  OT Frequency: Min 2X/week   Barriers to D/C:            Co-evaluation              AM-PAC PT "6 Clicks" Daily Activity     Outcome Measure Help from another person eating meals?: None Help from another person taking care of personal grooming?: A Little Help from another person toileting, which includes using toliet, bedpan, or urinal?: A Little Help from another person bathing (including washing, rinsing, drying)?: A Lot Help from another person to put on and taking off regular upper body clothing?: A Little Help from another person to put on and taking off regular lower body clothing?: A Lot 6 Click Score: 17   End of Session Equipment Utilized During Treatment: Rolling walker Nurse Communication: Mobility status  Activity Tolerance: Patient tolerated treatment well Patient left: in bed;with call bell/phone within reach;with bed alarm set  OT Visit Diagnosis: Other abnormalities of gait and mobility (R26.89);Other symptoms  and signs involving cognitive function;Muscle weakness (generalized) (M62.81)                Time: 1235-1300 OT Time Calculation (min): 25 min Charges:  OT General Charges $OT Visit: 1 Visit OT Evaluation $OT Eval Moderate Complexity: 1 Mod OT Treatments $Self Care/Home Management : 8-22  mins  Derrell Lolling, OTR/L Acute Rehabilitation Services Office [phone]   [name] 07/04/2018, 2:22 PM

## 2018-07-04 NOTE — Progress Notes (Signed)
2 Days Post-Op   Subjective/Chief Complaint: Pt feeling more stable this AM and less "crosseyed."    Objective: Vital signs in last 24 hours: Temp:  [97.9 F (36.6 C)-98.2 F (36.8 C)] 98 F (36.7 C) (11/09 0430) Pulse Rate:  [54-59] 59 (11/09 0430) Resp:  [14-18] 18 (11/09 0430) BP: (138-146)/(68-77) 145/77 (11/09 0430) SpO2:  [92 %-95 %] 92 % (11/09 0430) Last BM Date: 07/01/18  Intake/Output from previous day: 11/08 0701 - 11/09 0700 In: 720 [P.O.:720] Out: 200 [Urine:200] Intake/Output this shift: No intake/output data recorded.  General appearance: alert, cooperative and no distress Resp: breathing comfortably Breasts: no evidence of large hematoma or seroma.  minimal tenderness  Lab Results:  Recent Labs    07/03/18 0811 07/04/18 0426  WBC 11.7* 7.6  HGB 13.2 12.7  HCT 42.8 42.4  PLT 125* 116*   BMET Recent Labs    07/03/18 0811 07/04/18 0426  NA 139 139  K 4.7 4.5  CL 105 104  CO2 25 30  GLUCOSE 127* 107*  BUN 30* 36*  CREATININE 1.47* 1.52*  CALCIUM 8.3* 8.2*   PT/INR No results for input(s): LABPROT, INR in the last 72 hours. ABG No results for input(s): PHART, HCO3 in the last 72 hours.  Invalid input(s): PCO2, PO2  Studies/Results: No results found.  Anti-infectives: Anti-infectives (From admission, onward)   Start     Dose/Rate Route Frequency Ordered Stop   07/02/18 1600  ceFAZolin (ANCEF) IVPB 2g/100 mL premix     2 g 200 mL/hr over 30 Minutes Intravenous Every 8 hours 07/02/18 1311 07/02/18 1756   07/02/18 0630  ceFAZolin (ANCEF) IVPB 2g/100 mL premix     2 g 200 mL/hr over 30 Minutes Intravenous On call to O.R. 07/02/18 0622 07/02/18 0828      Assessment/Plan: s/p Procedure(s): BREAST LUMPECTOMY WITH RADIOACTIVE SEED WITH RIGHT SEED TARGETED LYMPH NODE BIOPSY AND SENTINEL LYMPH NODE BIOPSY (Right) PT yesterday recommended SNF.   Pt feeling better and wants to go home. Will get PT to come back by.  Hopefully can go home  with [facility] PT and home health aide.  She reports benefit from insurance that will bring her 14 meals per week.   Pain well controlled other than headache.  Usually takes 2 aleve for headache.  Will increase.     LOS: 0 days    [name] 07/04/2018

## 2018-07-04 NOTE — Clinical Social Work Note (Signed)
Clinical Social Work Assessment  Patient Details  Name: [name] MRN: [id number] Date of Birth: 06-Mar-1946  Date of referral:  07/04/18               Reason for consult:  Facility Placement                Permission sought to share information with:  Facility Sport and exercise psychologist, Family Supports Permission granted to share information::  Yes, Verbal Permission Granted  Name::        Agency::  SNFs  Relationship::     Contact Information:     Housing/Transportation Living arrangements for the past 2 months:  Apartment Source of Information:  Patient Patient Interpreter Needed:  None Criminal Activity/Legal Involvement Pertinent to Current Situation/Hospitalization:  No - Comment as needed Significant Relationships:  Other Family Members Lives with:  Self Do you feel safe going back to the place where you live?  Yes Need for family participation in patient care:  No (Coment)  Care giving concerns:  CSW received consult for possible SNF placement at time of discharge. CSW spoke with patient regarding PT recommendation of SNF placement at time of discharge. Patient reported that she may not be able to care for herself at home given patient's current physical needs and fall risk. Patient expressed understanding of PT recommendation and is agreeable to SNF placement at time of discharge. CSW to continue to follow and assist with discharge planning needs.   Social Worker assessment / plan:  CSW spoke with patient concerning possibility of rehab at [facility] before returning home.  Employment status:  Retired Nurse, adult PT Recommendations:  [place] / Referral to community resources:  [place]  Patient/Family's Response to care: Patient recognizes need for rehab before returning home and is agreeable to a SNF in [place]. Patient reported preference for [facility] since she went there in April and liked it,  though she reported that she did not feel that they give medications on time. CSW explained insurance process and that it may take a few days to receive. She stated that she may decide to return home with home health if she is feeling better. She also asked CSW about a food assistance delivery program through [facility]. CSW advised patient to call them to set it up since [facility] will only speak to the patient.   Patient/Family's Understanding of and Emotional Response to Diagnosis, Current Treatment, and Prognosis:  Patient/family is realistic regarding therapy needs and expressed being hopeful for SNF placement. Patient expressed understanding of CSW role and discharge process as well as medical condition. No questions/concerns about plan or treatment.    Emotional Assessment Appearance:  Appears stated age Attitude/Demeanor/Rapport:  Engaged Affect (typically observed):  Accepting, Appropriate Orientation:  Oriented to Self, Oriented to Place, Oriented to  Time, Oriented to Situation Alcohol / Substance use:  Not Applicable Psych involvement (Current and /or in the community):  No (Comment)  Discharge Needs  Concerns to be addressed:  Care Coordination Readmission within the last 30 days:  No Current discharge risk:  Lives alone Barriers to Discharge:  Continued Medical Work up   [employer], LCSW 07/04/2018, 11:51 AM

## 2018-07-04 NOTE — NC FL2 (Signed)
[place] LEVEL OF CARE SCREENING TOOL     IDENTIFICATION  Patient Name: [name] Birthdate: September 01, 1945 Sex: female Admission Date (Current Location): 07/02/2018  [facility] and [place] Number:  Herbalist and Address:  [facility] [place] [address], [place], [place] [address]      Provider Number: [id number]  Attending Physician Name and Address:  [name], MD  Relative Name and Phone Number:  [name] daughter, [phone]    Current Level of Care: Hospital Recommended Level of Care: [place] Prior Approval Number:    Date Approved/Denied:   PASRR Number: [id number]  Discharge Plan: SNF    Current Diagnoses: Patient Active Problem List   Diagnosis Date Noted  . Breast cancer metastasized to axillary lymph node, right ([place]) 07/02/2018  . Malignant neoplasm of upper-outer quadrant of right breast in female, estrogen receptor positive ([place]) 06/17/2018  . Vertigo 12/23/2017  . Hypertension 12/23/2017  . Essential hypertension, benign 12/05/2017  . Chronic allergic rhinitis 12/05/2017  . Hypothyroidism due to acquired atrophy of thyroid 12/05/2017  . GERD without esophagitis 12/05/2017  . Arthritis/arthropathy of multiple joints 12/05/2017  . Aneurysm of descending thoracic aorta (HCC) 12/05/2017  . Atypical pneumonia 11/29/2017  . Dyspnea on exertion 10/27/2017  . Dyslipidemia 10/27/2017  . Morbid obesity ([place]) 10/27/2017  . Hx of colonic polyps 12/03/2016  . Lactose intolerance 07/26/2016  . Tremor 01/29/2016  . Incontinence 08/06/2015  . Bacteremia   . Sepsis secondary to UTI ([place]) 07/25/2015  . Sleep apnea in adult   . Distal radius fracture, left 07/08/2015  . MDD (major depressive disorder), recurrent severe, without psychosis ([place]) 03/21/2015  . 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 Breast cancer of upper-outer quadrant of left female breast ([place]) 08/18/2013    Orientation RESPIRATION BLADDER  Height & Weight     Self, Time, Situation, Place  Normal Continent Weight: 111.1 kg Height:  5\' 2"  (157.5 cm)  BEHAVIORAL SYMPTOMS/MOOD NEUROLOGICAL BOWEL NUTRITION STATUS    Convulsions/Seizures Continent Diet(Please see DC Summary)  AMBULATORY STATUS COMMUNICATION OF NEEDS Skin   Limited Assist Verbally Surgical wounds(Closed incision on breast)                       Personal Care Assistance Level of Assistance  Bathing, Feeding, Dressing Bathing Assistance: Limited assistance Feeding assistance: Limited assistance Dressing Assistance: Limited assistance     Functional Limitations Info  Sight, Hearing, Speech Sight Info: Adequate Hearing Info: Adequate Speech Info: Adequate    SPECIAL CARE FACTORS FREQUENCY  PT (By licensed PT), OT (By licensed OT)     PT Frequency: 5x/week OT Frequency: 3x/week            Contractures Contractures Info: Not present    Additional Factors Info  Code Status, Allergies, Psychotropic Code Status Info: Full Allergies Info: Codeine Psychotropic Info: Klonopin;Lexapro         Current Medications (07/04/2018):  This is the current hospital active medication list Current Facility-Administered Medications  Medication Dose Route Frequency Provider Last Rate Last Dose  . acetaminophen (TYLENOL) tablet 650 mg  650 mg Oral Q6H PRN [name], MD       Or  . acetaminophen (TYLENOL) suppository 650 mg  650 mg Rectal Q6H PRN [name], MD      . albuterol (PROVENTIL) (2.5 MG/3ML) 0.083% nebulizer solution 2.5 mg  2.5 mg Inhalation Q6H PRN [name], MD      . amLODipine (NORVASC)  tablet 5 mg  5 mg Oral Daily [name], MD   5 mg at 07/04/18 0952  . atenolol (TENORMIN) tablet 25 mg  25 mg Oral QHS [name], MD   25 mg at 07/03/18 2112  . clonazePAM (KLONOPIN) tablet 1 mg  1 mg Oral TID [name], MD   1 mg at 07/04/18 0842  . diphenhydrAMINE (BENADRYL) 12.5 MG/5ML elixir 12.5 mg  12.5 mg Oral Q6H PRN [name],  MD       Or  . diphenhydrAMINE (BENADRYL) injection 12.5 mg  12.5 mg Intravenous Q6H PRN [name], MD      . escitalopram (LEXAPRO) tablet 20 mg  20 mg Oral QHS [name], MD   20 mg at 07/03/18 2112  . fluticasone (FLONASE) 50 MCG/ACT nasal spray 1 spray  1 spray Each Nare Daily PRN [name], MD      . guaiFENesin-dextromethorphan (ROBITUSSIN DM) 100-10 MG/5ML syrup 5 mL  5 mL Oral Q4H PRN [name], MD      . hydrALAZINE (APRESOLINE) injection 10 mg  10 mg Intravenous Q2H PRN [name], MD      . levothyroxine (SYNTHROID, LEVOTHROID) tablet 50 mcg  50 mcg Oral QAC breakfast [name], MD   50 mcg at 07/04/18 0606  . loperamide (IMODIUM) capsule 2 mg  2 mg Oral QID PRN [name], MD      . montelukast (SINGULAIR) tablet 10 mg  10 mg Oral QHS [name], MD   10 mg at 07/03/18 2112  . naproxen sodium (ANAPROX) tablet 550 mg  550 mg Oral BID PRN [name], MD      . ondansetron (ZOFRAN-ODT) disintegrating tablet 4 mg  4 mg Oral Q6H PRN [name], MD       Or  . ondansetron (ZOFRAN) injection 4 mg  4 mg Intravenous Q6H PRN [name], MD      . oxyCODONE (Oxy IR/ROXICODONE) immediate release tablet 5-10 mg  5-10 mg Oral Q4H PRN [name], MD      . traMADol Veatrice Bourbon) tablet 50 mg  50 mg Oral Q6H PRN [name], MD   50 mg at 07/04/18 1829     Discharge Medications: Please see discharge summary for a list of discharge medications.  Relevant Imaging Results:  Relevant Lab Results:   Additional Information ssn: [id number]  Benard Halsted, LCSW

## 2018-07-05 ENCOUNTER — Observation Stay (HOSPITAL_COMMUNITY): Payer: Medicare Other

## 2018-07-05 DIAGNOSIS — C50411 Malignant neoplasm of upper-outer quadrant of right female breast: Secondary | ICD-10-CM | POA: Diagnosis not present

## 2018-07-05 MED ORDER — MUPIROCIN 2 % EX OINT
TOPICAL_OINTMENT | CUTANEOUS | Status: AC
  Administered 2018-07-05: 12:00:00
  Filled 2018-07-05: qty 22

## 2018-07-05 NOTE — Progress Notes (Signed)
Physical Therapy Treatment Patient Details Name: [name] MRN: [id number] DOB: May 20, 1946 Today's Date: 07/05/2018    History of Present Illness Pt is a 72 y/o female with R breast cancer and is s/p R breast lumpectomy with radioactive seed and axillary lymph node dissection. PMH includes L breast cancer s/p lumpectomy.     PT Comments    PT session focusing on improving functional mobility. Patient continues to require Min guard for transfers and bed level mobility. Up to Min A with gait due to unsteadiness with small LOB throughout. Patient stating she does not feel that she would be able to go home independently currently, but also does not wish to go to SNF. PT to continue to recommend SNF currently due to functional status. PT to continue to follow.     Follow Up Recommendations  SNF;Supervision/Assistance - 24 hour(may progress to HHPT if symptoms improve)     Equipment Recommendations  Rolling walker with 5" wheels    Recommendations for Other Services OT consult     Precautions / Restrictions Precautions Precautions: Fall Restrictions Weight Bearing Restrictions: No    Mobility  Bed Mobility Overal bed mobility: Needs Assistance Bed Mobility: Sit to Supine       Sit to supine: Min guard   General bed mobility comments: increased time and effort; cueing for safety and sequencing  Transfers Overall transfer level: Needs assistance Equipment used: Rolling walker (2 wheeled) Transfers: Sit to/from Stand Sit to Stand: Min guard         General transfer comment: min guard from recliner; hands on for safety and steadying upon standing  Ambulation/Gait Ambulation/Gait assistance: Min assist;Min guard Gait Distance (Feet): 60 Feet(x2) Assistive device: Rolling walker (2 wheeled) Gait Pattern/deviations: Step-through pattern;Decreased stride length Gait velocity: Decreased    General Gait Details: slow and shaky gati pattern - small LOB requiring close  min guard/min A for steadying; O2 sats 92% on RA throughout mobility   Stairs             Wheelchair Mobility    Modified Rankin (Stroke Patients Only)       Balance Overall balance assessment: Needs assistance Sitting-balance support: No upper extremity supported;Feet supported Sitting balance-Leahy Scale: Fair     Standing balance support: Bilateral upper extremity supported;During functional activity Standing balance-Leahy Scale: Poor Standing balance comment: Reliant on BUE support                             Cognition Arousal/Alertness: Awake/alert Behavior During Therapy: WFL for tasks assessed/performed Overall Cognitive Status: No family/caregiver present to determine baseline cognitive functioning                                 General Comments: has some eyes closed during seated/supine conversation; mouth tremors throughout      Exercises      General Comments General comments (skin integrity, edema, etc.): patient states that she does not feel that she could be home alone currently, however wants to return home rather than SNF      Pertinent Vitals/Pain Pain Assessment: No/denies pain    Home Living                      Prior Function            PT Goals (current goals can now be found in the  care plan section) Acute Rehab PT Goals Patient Stated Goal: to feel better PT Goal Formulation: With patient Time For Goal Achievement: 07/17/18 Potential to Achieve Goals: Fair Progress towards PT goals: Progressing toward goals    Frequency    Min 2X/week      PT Plan Current plan remains appropriate    Co-evaluation              AM-PAC PT "6 Clicks" Daily Activity  Outcome Measure  Difficulty turning over in bed (including adjusting bedclothes, sheets and blankets)?: A Little Difficulty moving from lying on back to sitting on the side of the bed? : Unable Difficulty sitting down on and standing  up from a chair with arms (e.g., wheelchair, bedside commode, etc,.)?: Unable Help needed moving to and from a bed to chair (including a wheelchair)?: A Little Help needed walking in hospital room?: A Little Help needed climbing 3-5 steps with a railing? : A Lot 6 Click Score: 13    End of Session Equipment Utilized During Treatment: Gait belt Activity Tolerance: Patient tolerated treatment well;Patient limited by fatigue Patient left: in bed;with call bell/phone within reach Nurse Communication: Mobility status PT Visit Diagnosis: Unsteadiness on feet (R26.81);Muscle weakness (generalized) (M62.81)     Time: 7619-5093 PT Time Calculation (min) (ACUTE ONLY): 18 min  Charges:  $Gait Training: 8-22 mins                     Lanney Gins, PT, DPT Supplemental Physical Therapist 07/05/18 2:42 PM Pager: [phone] Office: [phone]

## 2018-07-05 NOTE — Discharge Summary (Addendum)
Physician Discharge Summary  Patient ID: [name] MRN: [id number] DOB/AGE: 04/19/1946 72 y.o.  Admit date: 07/02/2018 Discharge date: 07/07/2018  Admission Diagnoses: Patient Active Problem List   Diagnosis Date Noted  . Breast cancer metastasized to axillary lymph node, right ([place]) 07/02/2018  . Malignant neoplasm of upper-outer quadrant of right breast in female, estrogen receptor positive ([place]) 06/17/2018  . Vertigo 12/23/2017  . Hypertension 12/23/2017  . Essential hypertension, benign 12/05/2017  . Chronic allergic rhinitis 12/05/2017  . Hypothyroidism due to acquired atrophy of thyroid 12/05/2017  . GERD without esophagitis 12/05/2017  . Arthritis/arthropathy of multiple joints 12/05/2017  . Aneurysm of descending thoracic aorta (HCC) 12/05/2017  . Atypical pneumonia 11/29/2017  . Dyspnea on exertion 10/27/2017  . Dyslipidemia 10/27/2017  . Morbid obesity ([place]) 10/27/2017  . Hx of colonic polyps 12/03/2016  . Lactose intolerance 07/26/2016  . Tremor 01/29/2016  . Incontinence 08/06/2015  . Bacteremia   . Sepsis secondary to UTI ([place]) 07/25/2015  . Sleep apnea in adult   . Distal radius fracture, left 07/08/2015  . MDD (major depressive disorder), recurrent severe, without psychosis ([place]) 03/21/2015  . 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 Breast cancer of upper-outer quadrant of left female breast ([place]) 08/18/2013    Discharge Diagnoses:  Active Problems:   Breast cancer metastasized to axillary lymph node, right ([place]) and same as above.   Deconditioning, tremors, fatigue  Discharged Condition: stable  Hospital Course:  Pt was admitted to floor following right seed localized lumpectomy, seed targeted lymph node biopsy and sentinel lymph node biopsy right axilla 07/02/2018.  She was very shaky and weak post op and requested to stay.  On POD 1, she reported dizziness and balance issues and did not want to go home.  PT was consulted.  SNF was recommended. OT also saw and  did not think she was able to go home without 24 hour assistance which she does not have.  She remains a bit tremulous due to her pseudoseizures.  The timing of her klonepin was adjusted to help with this.  She has not had any issues related to her breast surgery.  Her wounds have been fine.  Path is back and she had negative margins as well as 2/3 LN positive.  She had an episode of shortness of breath.  She had nebulizers and IS.  CXR was negative.    She is discharged to [facility] 07/07/18  Consults: therapies.  Significant Diagnostic Studies: labs: Cr stable prior to d/c.  Baseline around 1.5.  HCT 42.4, K 4.5  Treatments: surgery: see above  Discharge Exam: Blood pressure 167/90, pulse 50, temperature 98.1 F (36.7 C), temperature source Oral, resp. rate 20, height 5\' 2"  (1.575 m), weight 111.1 kg, SpO2 96 %. General appearance: alert, cooperative and mild distress Resp: breathing comfortably Breasts: right breast and axillary incisions are without erythema or drainage.  minimal tenderness GI: soft, non distended, non tender Extremities: extremities normal, atraumatic, no cyanosis or edema  Disposition:   Discharge Instructions    Call MD for:  difficulty breathing, headache or visual disturbances   Complete by:  As directed    Call MD for:  difficulty breathing, headache or visual disturbances   Complete by:  As directed    Call MD for:  hives   Complete by:  As directed    Call MD for:  persistant nausea and vomiting   Complete by:  As directed    Call MD for:  persistant nausea and vomiting  Complete by:  As directed    Call MD for:  redness, tenderness, or signs of infection (pain, swelling, redness, odor or green/yellow discharge around incision site)   Complete by:  As directed    Call MD for:  redness, tenderness, or signs of infection (pain, swelling, redness, odor or green/yellow discharge around incision site)   Complete by:  As directed    Call MD for:  severe  uncontrolled pain   Complete by:  As directed    Call MD for:  severe uncontrolled pain   Complete by:  As directed    Call MD for:  temperature >100.4   Complete by:  As directed    Call MD for:  temperature >100.4   Complete by:  As directed    Diet - low sodium heart healthy   Complete by:  As directed    Diet - low sodium heart healthy   Complete by:  As directed    Increase activity slowly   Complete by:  As directed    Increase activity slowly   Complete by:  As directed      Allergies as of 07/07/2018      Reactions   Codeine Nausea And Vomiting      Medication List    TAKE these medications   acetaminophen 325 MG tablet Commonly known as:  TYLENOL Take 2 tablets (650 mg total) by mouth every 6 (six) hours as needed for mild pain, fever or headache (or Fever >/= 101).   albuterol 108 (90 Base) MCG/ACT inhaler Commonly known as:  PROVENTIL HFA;VENTOLIN HFA Inhale 2 puffs into the lungs every 6 (six) hours as needed for wheezing or shortness of breath.   amLODipine 5 MG tablet Commonly known as:  NORVASC Take 5 mg by mouth daily.   atenolol 25 MG tablet Commonly known as:  TENORMIN Take 25 mg by mouth at bedtime.   clonazePAM 1 MG tablet Commonly known as:  KLONOPIN Take 1 mg by mouth 3 (three) times daily.   escitalopram 20 MG tablet Commonly known as:  LEXAPRO Take 20 mg by mouth at bedtime.   fluticasone 50 MCG/ACT nasal spray Commonly known as:  FLONASE Place 1 spray into both nostrils daily as needed for allergies.   guaiFENesin-dextromethorphan 100-10 MG/5ML syrup Commonly known as:  ROBITUSSIN DM Take 5 mLs by mouth every 4 (four) hours as needed for cough.   IMODIUM PO Take 1 tablet by mouth daily as needed (diarrhea).   levothyroxine 50 MCG tablet Commonly known as:  SYNTHROID, LEVOTHROID Take 50 mcg by mouth daily before breakfast.   miconazole 2 % cream Commonly known as:  MICOTIN Apply topically 2 (two) times daily.   montelukast  10 MG tablet Commonly known as:  SINGULAIR Take 10 mg by mouth at bedtime.   naproxen sodium 220 MG tablet Commonly known as:  ALEVE Take 440 mg by mouth 2 (two) times daily as needed (pain).   traMADol 50 MG tablet Commonly known as:  ULTRAM Take 1 tablet (50 mg total) by mouth every 6 (six) hours as needed for moderate pain or severe pain.       Contact information for follow-up providers    [name], MD In 2 weeks.   Specialty:  General Surgery Contact information: [address] [place] [address] [phone]            Contact information for after-discharge care    Destination    [facility] Preferred SNF .   Service:  Skilled Nursing Contact information: [address] [place] [phone]                  Signed: [name] 07/07/2018, 12:21 PM

## 2018-07-05 NOTE — Progress Notes (Addendum)
3 Days Post-Op   Subjective/Chief Complaint: SNF still recommended by PT/OT.  Pt complaining of not getting her meds in time and being shaky.  She is not really able to tell me when she takes them at home, however.   She also had an episode of desaturation.    Objective: Vital signs in last 24 hours: Temp:  [97.7 F (36.5 C)-98.6 F (37 C)] 97.7 F (36.5 C) (11/10 0618) Pulse Rate:  [46-80] 46 (11/10 0618) Resp:  [15-18] 15 (11/10 0618) BP: (139-156)/(60-87) 156/80 (11/10 0618) SpO2:  [80 %-95 %] 92 % (11/10 0618) Last BM Date: 06/29/18  Intake/Output from previous day: 11/09 0701 - 11/10 0700 In: 1440 [P.O.:1440] Out: 400 [Urine:400] Intake/Output this shift: No intake/output data recorded.  General appearance: alert, cooperative sl tremulous. Resp: breathing comfortably Breasts: no evidence of large hematoma or seroma.  minimal tenderness  Lab Results:  Recent Labs    07/03/18 0811 07/04/18 0426  WBC 11.7* 7.6  HGB 13.2 12.7  HCT 42.8 42.4  PLT 125* 116*   BMET Recent Labs    07/03/18 0811 07/04/18 0426  NA 139 139  K 4.7 4.5  CL 105 104  CO2 25 30  GLUCOSE 127* 107*  BUN 30* 36*  CREATININE 1.47* 1.52*  CALCIUM 8.3* 8.2*   PT/INR No results for input(s): LABPROT, INR in the last 72 hours. ABG No results for input(s): PHART, HCO3 in the last 72 hours.  Invalid input(s): PCO2, PO2  Studies/Results: No results found.  Anti-infectives: Anti-infectives (From admission, onward)   Start     Dose/Rate Route Frequency Ordered Stop   07/02/18 1600  ceFAZolin (ANCEF) IVPB 2g/100 mL premix     2 g 200 mL/hr over 30 Minutes Intravenous Every 8 hours 07/02/18 1311 07/02/18 1756   07/02/18 0630  ceFAZolin (ANCEF) IVPB 2g/100 mL premix     2 g 200 mL/hr over 30 Minutes Intravenous On call to O.R. 07/02/18 0622 07/02/18 0828      Assessment/Plan: s/p Procedure(s): BREAST LUMPECTOMY WITH RADIOACTIVE SEED WITH RIGHT SEED TARGETED LYMPH NODE BIOPSY AND  SENTINEL LYMPH NODE BIOPSY (Right) PT yesterday recommended SNF.   SNF pending.   Will look at timing of klonepin.  Check p CXR   LOS: 0 days    [name] 07/05/2018

## 2018-07-06 DIAGNOSIS — C50411 Malignant neoplasm of upper-outer quadrant of right female breast: Secondary | ICD-10-CM | POA: Diagnosis not present

## 2018-07-06 MED ORDER — MICONAZOLE NITRATE 2 % EX CREA
TOPICAL_CREAM | Freq: Two times a day (BID) | CUTANEOUS | Status: DC
  Administered 2018-07-06 – 2018-07-07 (×3): via TOPICAL
  Filled 2018-07-06: qty 28.4

## 2018-07-06 MED ORDER — MICONAZOLE NITRATE 2 % EX CREA: TOPICAL_CREAM | Freq: Two times a day (BID) | CUTANEOUS | 0 refills | Status: DC

## 2018-07-06 MED ORDER — TRAMADOL HCL 50 MG PO TABS: 50.0000 mg | ORAL_TABLET | Freq: Four times a day (QID) | ORAL | 1 refills | Status: DC | PRN

## 2018-07-06 NOTE — Progress Notes (Signed)
4 Days Post-Op   Subjective/Chief Complaint: Shakiness improved after timing of meds changed.  Complains of baseline incontinence and not having the pads/diapers that she likes.    She also is having itching between her breasts at the binder velcro area.    Objective: Vital signs in last 24 hours: Temp:  [97.9 F (36.6 C)-98 F (36.7 C)] 97.9 F (36.6 C) (11/11 0445) Pulse Rate:  [49-58] 49 (11/11 0445) Resp:  [17-18] 18 (11/11 0445) BP: (147-166)/(70-99) 147/91 (11/11 0445) SpO2:  [92 %-94 %] 94 % (11/11 0445) Last BM Date: 07/05/18  Intake/Output from previous day: 11/10 0701 - 11/11 0700 In: 800 [P.O.:800] Out: 450 [Urine:450] Intake/Output this shift: Total I/O In: 300 [P.O.:300] Out: 400 [Urine:400]  General appearance: alert, cooperative sl tremulous. Resp: breathing comfortably Breasts: no evidence of large hematoma or seroma.  minimal tenderness Red area in midline below breasts.    Lab Results:  Recent Labs    07/04/18 0426  WBC 7.6  HGB 12.7  HCT 42.4  PLT 116*   BMET Recent Labs    07/04/18 0426  NA 139  K 4.5  CL 104  CO2 30  GLUCOSE 107*  BUN 36*  CREATININE 1.52*  CALCIUM 8.2*   PT/INR No results for input(s): LABPROT, INR in the last 72 hours. ABG No results for input(s): PHART, HCO3 in the last 72 hours.  Invalid input(s): PCO2, PO2  Studies/Results: Dg Chest Port 1 View  Result Date: 07/05/2018 CLINICAL DATA:  Shortness of breath. EXAM: PORTABLE CHEST 1 VIEW COMPARISON:  Radiographs of November 28, 2017. FINDINGS: Stable cardiomegaly. Atherosclerosis of thoracic aorta is noted. No pneumothorax or pleural effusion is noted. No acute pulmonary disease is noted. Bony thorax is unremarkable. IMPRESSION: No acute cardiopulmonary abnormality seen. Aortic Atherosclerosis (ICD10-I70.0). Electronically Signed   By: Marijo Conception, M.D.   On: 07/05/2018 14:22    Anti-infectives: Anti-infectives (From admission, onward)   Start     Dose/Rate  Route Frequency Ordered Stop   07/02/18 1600  ceFAZolin (ANCEF) IVPB 2g/100 mL premix     2 g 200 mL/hr over 30 Minutes Intravenous Every 8 hours 07/02/18 1311 07/02/18 1756   07/02/18 0630  ceFAZolin (ANCEF) IVPB 2g/100 mL premix     2 g 200 mL/hr over 30 Minutes Intravenous On call to O.R. 07/02/18 0622 07/02/18 0828      Assessment/Plan: s/p Procedure(s): BREAST LUMPECTOMY WITH RADIOACTIVE SEED WITH RIGHT SEED TARGETED LYMPH NODE BIOPSY AND SENTINEL LYMPH NODE BIOPSY (Right) SNF still recommended   SNF pending.   Miconazole to yeasty area between breasts. D/c breast binder. Path with negative margins and 2/3 LN positive.     LOS: 0 days    [name] 07/06/2018

## 2018-07-06 NOTE — Clinical Social Work Note (Signed)
Patient needs skilled nursing facility for ST rehab and CSW advised that [facility] is preferred facility. CSW visit with patient earlier today and confirmed facility preference as [facility]. Facility admissions director Caryl Pina contacted regarding patient and she initiated insurance authorization with [facility] Medicare today. Patient will discharge once insurance authorization received.  Cornisha Zetino Givens, MSW, LCSW Licensed Clinical Social Worker [place] [phone]

## 2018-07-06 NOTE — Plan of Care (Signed)
  Problem: Pain Managment: Goal: General experience of comfort will improve Outcome: Progressing   Problem: Safety: Goal: Ability to remain free from injury will improve Outcome: Progressing   Problem: Skin Integrity: Goal: Risk for impaired skin integrity will decrease Outcome: Progressing   

## 2018-07-07 ENCOUNTER — Encounter: Payer: Medicare Other | Admitting: Thoracic Surgery (Cardiothoracic Vascular Surgery)

## 2018-07-07 DIAGNOSIS — C50411 Malignant neoplasm of upper-outer quadrant of right female breast: Secondary | ICD-10-CM | POA: Diagnosis not present

## 2018-07-07 NOTE — Social Work (Signed)
Clinical Social Worker facilitated patient discharge including contacting patient family and facility to confirm patient discharge plans.  Clinical information faxed to facility and family agreeable with plan.  CSW arranged ambulance transport via [facility] to [facility].  RN to call [phone] with report  prior to discharge.  Clinical Social Worker will sign off for now as social work intervention is no longer needed. Please consult us again if new need arises.  Yadira Hada H Menashe Kafer, LCSWA Clinical Social Worker [phone]  

## 2018-07-07 NOTE — Plan of Care (Signed)
  Problem: Activity: Goal: Risk for activity intolerance will decrease Outcome: Progressing   Problem: Nutrition: Goal: Adequate nutrition will be maintained Outcome: Progressing   Problem: Safety: Goal: Ability to remain free from injury will improve Outcome: Progressing   

## 2018-07-07 NOTE — Progress Notes (Signed)
Physical Therapy Treatment Patient Details Name: [name] MRN: [id number] DOB: 11-29-1945 Today's Date: 07/07/2018    History of Present Illness Pt is a 72 y/o female with R breast cancer and is s/p R breast lumpectomy with radioactive seed and axillary lymph node dissection. PMH includes L breast cancer s/p lumpectomy.     PT Comments    Pt making slow progress towards her goals today, however is limited in safe mobility by oxygen desaturation (see General Comments), as well as decreased balance and endurance. Pt currently requires min guard A for transfers and short distance ambulation. D/c plans remain appropriate at this time.    Follow Up Recommendations  SNF;Supervision/Assistance - 24 hour     Equipment Recommendations  Rolling walker with 5" wheels    Recommendations for Other Services       Precautions / Restrictions Precautions Precautions: Fall Restrictions Weight Bearing Restrictions: No    Mobility  Bed Mobility               General bed mobility comments: OOB in recliner  Transfers Overall transfer level: Needs assistance Equipment used: Rolling walker (2 wheeled) Transfers: Sit to/from Stand Sit to Stand: Min guard         General transfer comment: min guard for safety from recliner and toilet, good use of handrails in bathroom  Ambulation/Gait Ambulation/Gait assistance: Min assist;Min guard Gait Distance (Feet): 60 Feet(x2) Assistive device: Rolling walker (2 wheeled) Gait Pattern/deviations: Step-through pattern;Decreased stride length Gait velocity: Decreased  Gait velocity interpretation: <1.8 ft/sec, indicate of risk for recurrent falls General Gait Details: slow - mildly unsteady gait requiring hands on min guard for safety, no assist needed with ambulation today         Balance Overall balance assessment: Needs assistance Sitting-balance support: No upper extremity supported;Feet supported Sitting balance-Leahy Scale:  Fair     Standing balance support: Bilateral upper extremity supported;During functional activity Standing balance-Leahy Scale: Poor Standing balance comment: Reliant on BUE support                             Cognition Arousal/Alertness: Awake/alert Behavior During Therapy: WFL for tasks assessed/performed Overall Cognitive Status: No family/caregiver present to determine baseline cognitive functioning                                 General Comments: has some eyes closed during seated but able to follow conversation       Exercises      General Comments General comments (skin integrity, edema, etc.): Pt on 2L O2 via [place] at entry with SaO2 of 97%O2, when swithched to RA SaO2 dropped to 88%O2, 2 L O2 via [place] returned for ambualtion and SaO2 90%O2      Pertinent Vitals/Pain Pain Assessment: Faces Faces Pain Scale: Hurts little more Pain Location: Headache Pain Descriptors / Indicators: Headache Pain Intervention(s): Limited activity within patient's tolerance;Monitored during session;Premedicated before session;Repositioned           PT Goals (current goals can now be found in the care plan section) Acute Rehab PT Goals Patient Stated Goal: to feel better PT Goal Formulation: With patient Time For Goal Achievement: 07/17/18 Potential to Achieve Goals: Fair Progress towards PT goals: Progressing toward goals    Frequency    Min 2X/week      PT Plan Current plan remains appropriate  AM-PAC PT "6 Clicks" Daily Activity  Outcome Measure  Difficulty turning over in bed (including adjusting bedclothes, sheets and blankets)?: A Little Difficulty moving from lying on back to sitting on the side of the bed? : Unable Difficulty sitting down on and standing up from a chair with arms (e.g., wheelchair, bedside commode, etc,.)?: Unable Help needed moving to and from a bed to chair (including a wheelchair)?: A Little Help needed walking in  hospital room?: A Little Help needed climbing 3-5 steps with a railing? : A Lot 6 Click Score: 13    End of Session Equipment Utilized During Treatment: Gait belt Activity Tolerance: Patient tolerated treatment well;Patient limited by fatigue Patient left: with call bell/phone within reach;in chair Nurse Communication: Mobility status PT Visit Diagnosis: Unsteadiness on feet (R26.81);Muscle weakness (generalized) (M62.81)     Time: 6578-4696 PT Time Calculation (min) (ACUTE ONLY): 18 min  Charges:  $Gait Training: 8-22 mins                     Tramar Brueckner B. Beverely Risen PT, DPT Acute Rehabilitation Services Pager [phone] Office [phone]    Elon Alas Fleet 07/07/2018, 11:12 AM

## 2018-07-07 NOTE — Social Work (Addendum)
CSW continuing to await insurance authorization for pt to discharge to SNF.  12:07pm- Pt has insurance authorization for d/c to SNF, will contact attending MD for updated d/c summary.  Westley Hummer, MSW, [place] Work [phone]

## 2018-07-07 NOTE — Progress Notes (Signed)
Discharged to [facility].Report given to [facility] Cruz,RN.Personal belongings given to patient.Will be transported via [facility] [facility]

## 2018-07-07 NOTE — Clinical Social Work Placement (Addendum)
   CLINICAL SOCIAL WORK PLACEMENT  NOTE [facility] [place] RN to call [phone]  with report prior to discharge.   Date:  07/07/2018  Patient Details  Name: [name] MRN: [id number] Date of Birth: [birth year]  Clinical Social Work is seeking post-discharge placement for this patient at the [place] level of care (*CSW will initial, date and re-position this form in  chart as items are completed):  Yes   Patient/family provided with [place] Work Department's list of facilities offering this level of care within the geographic area requested by the patient (or if unable, by the patient's family).  Yes   Patient/family informed of their freedom to choose among providers that offer the needed level of care, that participate in Medicare, Medicaid or managed care program needed by the patient, have an available bed and are willing to accept the patient.  Yes   Patient/family informed of [facility]'s ownership interest in [facility] and [facility]), as well as of the fact that they are under no obligation to receive care at these facilities.  PASRR submitted to EDS on       PASRR number received on       Existing PASRR number confirmed on 07/04/18     FL2 transmitted to all facilities in geographic area requested by pt/family on 07/04/18     FL2 transmitted to all facilities within larger geographic area on       Patient informed that his/her managed care company has contracts with or will negotiate with certain facilities, including the following:        Yes   Patient/family informed of bed offers received.  Patient chooses bed at [facility]     Physician recommends and patient chooses bed at      Patient to be transferred to [facility] on 07/07/18.  Patient to be transferred to facility by [facility]     Patient family notified on 07/07/18 of transfer.  Name of family member notified:  pt daughter [name]       PHYSICIAN       Additional Comment:    _______________________________________________ Alexander Mt, [place] 07/07/2018, 12:32 PM

## 2018-07-08 ENCOUNTER — Encounter: Payer: Self-pay | Admitting: Internal Medicine

## 2018-07-08 ENCOUNTER — Non-Acute Institutional Stay (SKILLED_NURSING_FACILITY): Payer: Medicare Other | Admitting: Internal Medicine

## 2018-07-08 DIAGNOSIS — F332 Major depressive disorder, recurrent severe without psychotic features: Secondary | ICD-10-CM | POA: Diagnosis not present

## 2018-07-08 DIAGNOSIS — C50911 Malignant neoplasm of unspecified site of right female breast: Secondary | ICD-10-CM | POA: Diagnosis not present

## 2018-07-08 DIAGNOSIS — R569 Unspecified convulsions: Secondary | ICD-10-CM

## 2018-07-08 DIAGNOSIS — C773 Secondary and unspecified malignant neoplasm of axilla and upper limb lymph nodes: Secondary | ICD-10-CM

## 2018-07-08 DIAGNOSIS — F445 Conversion disorder with seizures or convulsions: Secondary | ICD-10-CM | POA: Diagnosis not present

## 2018-07-08 DIAGNOSIS — Z170 Estrogen receptor positive status [ER+]: Secondary | ICD-10-CM

## 2018-07-08 DIAGNOSIS — C50411 Malignant neoplasm of upper-outer quadrant of right female breast: Secondary | ICD-10-CM | POA: Diagnosis not present

## 2018-07-08 DIAGNOSIS — E034 Atrophy of thyroid (acquired): Secondary | ICD-10-CM

## 2018-07-08 DIAGNOSIS — I159 Secondary hypertension, unspecified: Secondary | ICD-10-CM

## 2018-07-08 NOTE — Progress Notes (Signed)
:  Location:  [place] Room Number: [phone] Place of Service:  SNF (31)  [name]. Ashley Coil, MD  Patient Care Team: London Pepper, MD as PCP - General (Family Medicine) Alvester Chou, NP (Nurse Practitioner)  Extended Emergency Contact Information Primary Emergency Contact: [name] [place] Mobile Phone: [phone] Relation: Daughter Secondary Emergency Contact: [name] Mobile Phone: [phone] Relation: Son     Allergies: Codeine  Chief Complaint  Patient presents with  . New Admit To SNF    Admit to [facility]    HPI: Patient is 72 y.o. female with vertigo, hypertension, hypothyroidism, GERD, aneurysm of thoracic aorta, pseudoseizures, who was admitted to [facility] from 11/7-12 for a planned right seed localized lumpectomy, seed targeted lymph node biopsy and sentinel lymph node biopsy right axilla on 11/7.  She was shaking week postop day 1 and requested to stay.  She has had pseudo-seizures during her stay and her Klonopin was adjusted to help with this she does not have any issues related to her breast surgery.  Path came back and she had negative margins and 2 out of 3 lymph nodes positive.  Patient is admitted to skilled nursing facility for OT/PT.  While at skilled nursing facility patient will be followed for pseudoseizures treated with clonazepam, hypertension treated with Norvasc atenolol, and depression treated with Lexapro.  Past Medical History:  Diagnosis Date  . Anemia   . Anxiety   . Arthritis   . Bladder incontinence   . Bowel incontinence   . Breast cancer ([place]) 08/16/13   left, 1 o'clock  . Chronic cystitis   . Chronic diarrhea   . Complication of anesthesia    hard to wake up  . Depression   . Full dentures   . GERD (gastroesophageal reflux disease)   . High blood pressure   . History of radiation therapy 11/02/13- 12/24/13   left breast 4680 cGy in 26 sessions, left breast boost 1400  cGy in 7 sessions  . HOH (hard of hearing)   . Hx of colonic polyps 12/03/2016  . Hypothyroidism   . Personal history of radiation therapy   . Pneumonia    hx  . Pseudoseizures   . Shortness of breath    when walks  . Sleep apnea    has a cpap-5 hr/ not used in years  . Stroke ([place]) 1980   legs weak  . Tremor   . Urinary incontinence   . Vertigo     Past Surgical History:  Procedure Laterality Date  . ABDOMINAL HYSTERECTOMY    . BACK SURGERY  2005   lumbar fusion  . BREAST LUMPECTOMY Left 2015  . BREAST LUMPECTOMY Right 07/02/2018   BREAST LUMPECTOMY WITH RADIOACTIVE SEED WITH RIGHT SEED TARGETED LYMPH NODE BIOPSY AND SENTINEL LYMPH NODE BIOPSY (Right Breast)  . BREAST LUMPECTOMY WITH NEEDLE LOCALIZATION AND AXILLARY SENTINEL LYMPH NODE BX Left 09/29/2013   Procedure: BREAST LUMPECTOMY WITH NEEDLE LOCALIZATION AND AXILLARY SENTINEL LYMPH NODE BX;  Surgeon: [name], MD;  Location: [place];  Service: General;  Laterality: Left;  clean wound class  . BREAST LUMPECTOMY WITH RADIOACTIVE SEED AND AXILLARY LYMPH NODE DISSECTION Right 07/02/2018   Procedure: BREAST LUMPECTOMY WITH RADIOACTIVE SEED WITH RIGHT SEED TARGETED LYMPH NODE BIOPSY AND SENTINEL LYMPH NODE BIOPSY;  Surgeon: [name], MD;  Location: [place];  Service: General;  Laterality: Right;  . COLONOSCOPY    . MULTIPLE TOOTH EXTRACTIONS    .  ORIF WRIST FRACTURE Left 07/08/2015   Procedure: OPEN REDUCTION INTERNAL FIXATION (ORIF) LEFT WRIST FRACTURE AND REPAIR AS INDICATED;  Surgeon: Iran Planas, MD;  Location: [place];  Service: Orthopedics;  Laterality: Left;  . ovarian cysts    . TOTAL HIP ARTHROPLASTY  2005   rt total hip    Allergies as of 07/08/2018      Reactions   Codeine Nausea And Vomiting      Medication List        Accurate as of 07/08/18 12:00 PM. Always use your most recent med list.          acetaminophen 325 MG tablet Commonly known as:  TYLENOL Take 2 tablets (650 mg  total) by mouth every 6 (six) hours as needed for mild pain, fever or headache (or Fever >/= 101).   albuterol 108 (90 Base) MCG/ACT inhaler Commonly known as:  PROVENTIL HFA;VENTOLIN HFA Inhale 2 puffs into the lungs every 6 (six) hours as needed for wheezing or shortness of breath.   amLODipine 5 MG tablet Commonly known as:  NORVASC Take 5 mg by mouth daily.   atenolol 25 MG tablet Commonly known as:  TENORMIN Take 25 mg by mouth at bedtime.   clonazePAM 1 MG tablet Commonly known as:  KLONOPIN Take 1 mg by mouth 3 (three) times daily.   escitalopram 20 MG tablet Commonly known as:  LEXAPRO Take 20 mg by mouth at bedtime.   fluticasone 50 MCG/ACT nasal spray Commonly known as:  FLONASE Place 1 spray into both nostrils daily as needed for allergies.   guaiFENesin-dextromethorphan 100-10 MG/5ML syrup Commonly known as:  ROBITUSSIN DM Take 5 mLs by mouth every 4 (four) hours as needed for cough.   IMODIUM PO Take 1 tablet by mouth daily as needed (diarrhea).   levothyroxine 50 MCG tablet Commonly known as:  SYNTHROID, LEVOTHROID Take 50 mcg by mouth daily before breakfast.   miconazole 2 % cream Commonly known as:  MICOTIN Apply topically 2 (two) times daily.   montelukast 10 MG tablet Commonly known as:  SINGULAIR Take 10 mg by mouth at bedtime.   naproxen sodium 220 MG tablet Commonly known as:  ALEVE Take 440 mg by mouth 2 (two) times daily as needed (pain).   traMADol 50 MG tablet Commonly known as:  ULTRAM Take 1 tablet (50 mg total) by mouth every 6 (six) hours as needed for moderate pain or severe pain.       No orders of the defined types were placed in this encounter.   Immunization History  Administered Date(s) Administered  . Influenza,inj,Quad PF,6+ Mos 07/09/2015    Social History   Tobacco Use  . Smoking status: Never Smoker  . Smokeless tobacco: Never Used  Substance Use Topics  . Alcohol use: No    Family history is   Family  History  Problem Relation Age of Onset  . Lung cancer Brother   . Diabetes Brother   . Stroke Mother   . Ataxia Neg Hx   . Chorea Neg Hx   . Dementia Neg Hx   . Mental retardation Neg Hx   . Migraines Neg Hx   . Multiple sclerosis Neg Hx   . Neurofibromatosis Neg Hx   . Neuropathy Neg Hx   . Parkinsonism Neg Hx   . Seizures Neg Hx       Review of Systems  DATA OBTAINED: from patient, nurse GENERAL:  no fevers, fatigue, appetite changes SKIN: No itching, or  rash EYES: No eye pain, redness, discharge EARS: No earache, tinnitus, change in hearing NOSE: No congestion, drainage or bleeding  MOUTH/THROAT: No mouth or tooth pain, No sore throat RESPIRATORY: No cough, wheezing, SOB CARDIAC: No chest pain, palpitations, lower extremity edema  GI: No abdominal pain, No N/V/D or constipation, No heartburn or reflux  GU: No dysuria, frequency or urgency, or incontinence  MUSCULOSKELETAL: No unrelieved bone/joint pain NEUROLOGIC: No headache, dizziness or focal weakness PSYCHIATRIC: No c/o anxiety or sadness   Vitals:   07/08/18 1156  BP: (!) 163/92  Pulse: 64  Resp: 18  Temp: (!) 97.3 F (36.3 C)    SpO2 Readings from Last 1 Encounters:  07/07/18 96%   Body mass index is 43.22 kg/m.     Physical Exam  GENERAL APPEARANCE: Alert, conversant,  No acute distress.;  Patient had a pseudo-seizure on arrival to SNF, resolved with clonazepam SKIN: Incisions right breast good no infection, some bruising HEAD: Normocephalic, atraumatic  EYES: Conjunctiva/lids clear. Pupils round, reactive. EOMs intact.  EARS: External exam WNL, canals clear. Hearing grossly normal.  NOSE: No deformity or discharge.  MOUTH/THROAT: Lips w/o lesions  RESPIRATORY: Breathing is even, unlabored. Lung sounds are clear   CARDIOVASCULAR: Heart RRR no murmurs, rubs or gallops. No peripheral edema.   GASTROINTESTINAL: Abdomen is soft, non-tender, not distended w/ normal bowel sounds. GENITOURINARY:  Bladder non tender, not distended  MUSCULOSKELETAL: No abnormal joints or musculature NEUROLOGIC:  Cranial nerves 2-12 grossly intact. Moves all extremities  PSYCHIATRIC: Mood and affect appropriate to situation, no behavioral issues  Patient Active Problem List   Diagnosis Date Noted  . Breast cancer metastasized to axillary lymph node, right ([place]) 07/02/2018  . Malignant neoplasm of upper-outer quadrant of right breast in female, estrogen receptor positive ([place]) 06/17/2018  . Vertigo 12/23/2017  . Hypertension 12/23/2017  . Essential hypertension, benign 12/05/2017  . Chronic allergic rhinitis 12/05/2017  . Hypothyroidism due to acquired atrophy of thyroid 12/05/2017  . GERD without esophagitis 12/05/2017  . Arthritis/arthropathy of multiple joints 12/05/2017  . Aneurysm of descending thoracic aorta ([facility]) 12/05/2017  . Atypical pneumonia 11/29/2017  . Dyspnea on exertion 10/27/2017  . Dyslipidemia 10/27/2017  . Morbid obesity ([place]) 10/27/2017  . Hx of colonic polyps 12/03/2016  . Lactose intolerance 07/26/2016  . Tremor 01/29/2016  . Incontinence 08/06/2015  . Bacteremia   . Sepsis secondary to UTI ([place]) 07/25/2015  . Sleep apnea in adult   . Distal radius fracture, left 07/08/2015  . MDD (major depressive disorder), recurrent severe, without psychosis ([place]) 03/21/2015  . 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 Breast cancer of upper-outer quadrant of left female breast ([place]) 08/18/2013      Labs reviewed: Basic Metabolic Panel:    Component Value Date/Time   NA 139 07/04/2018 0426   NA 144 09/01/2013 1221   K 4.5 07/04/2018 0426   K 4.2 09/01/2013 1221   CL 104 07/04/2018 0426   CO2 30 07/04/2018 0426   CO2 30 (H) 09/01/2013 1221   GLUCOSE 107 (H) 07/04/2018 0426   GLUCOSE 82 09/01/2013 1221   BUN 36 (H) 07/04/2018 0426   BUN 18.8 09/01/2013 1221   CREATININE 1.52 (H) 07/04/2018 0426   CREATININE 1.3 (H) 09/01/2013 1221   CALCIUM 8.2 (L) 07/04/2018 0426   CALCIUM 9.2  09/01/2013 1221   PROT 6.6 11/28/2017 2223   PROT 7.1 09/01/2013 1221   ALBUMIN 3.5 11/28/2017 2223   ALBUMIN 3.7 09/01/2013 1221   AST 24 11/28/2017 2223  AST 17 09/01/2013 1221   ALT 17 11/28/2017 2223   ALT 16 09/01/2013 1221   ALKPHOS 42 11/28/2017 2223   ALKPHOS 48 09/01/2013 1221   BILITOT 0.9 11/28/2017 2223   BILITOT 0.78 09/01/2013 1221   GFRNONAA 33 (L) 07/04/2018 0426   GFRAA 38 (L) 07/04/2018 0426    Recent Labs    12/02/17 0600 06/25/18 1430 07/03/18 0811 07/04/18 0426  NA 141 139 139 139  K 4.1 4.2 4.7 4.5  CL 107 104 105 104  CO2 26 29 25 30   GLUCOSE 130* 121* 127* 107*  BUN 32* 20 30* 36*  CREATININE 0.99 1.63* 1.47* 1.52*  CALCIUM 8.5* 8.7* 8.3* 8.2*  MG 2.2  --   --   --    Liver Function Tests: Recent Labs    11/28/17 2223  AST 24  ALT 17  ALKPHOS 42  BILITOT 0.9  PROT 6.6  ALBUMIN 3.5   No results for input(s): LIPASE, AMYLASE in the last 8760 hours. No results for input(s): AMMONIA in the last 8760 hours. CBC: Recent Labs    11/28/17 2223  06/25/18 1430 07/03/18 0811 07/04/18 0426  WBC 7.2   < > 5.3 11.7* 7.6  NEUTROABS 5.2  --   --   --   --   HGB 14.8   < > 14.7 13.2 12.7  HCT 45.8   < > 47.8* 42.8 42.4  MCV 100.0   < > 98.2 101.7* 102.9*  PLT 129*   < > 142* 125* 116*   < > = values in this interval not displayed.   Lipid No results for input(s): CHOL, HDL, LDLCALC, TRIG in the last 8760 hours.  Cardiac Enzymes: Recent Labs    11/28/17 2223  TROPONINI 0.04*   BNP: Recent Labs    11/28/17 2223  BNP 24.9   No results found for: MICROALBUR No results found for: HGBA1C No results found for: TSH No results found for: VITAMINB12 No results found for: FOLATE No results found for: IRON, TIBC, FERRITIN  Imaging and Procedures obtained prior to SNF admission: Nm Sentinel Node Inj-no Rpt (breast)  Result Date: 07/02/2018 Sulfur colloid was injected by the nuclear medicine technologist for melanoma sentinel node.    Mm Breast Surgical Specimen  Result Date: 07/02/2018 CLINICAL DATA:  Patient is post radioactive seed localization subsequent surgical excision of right axillary lymph node. EXAM: SPECIMEN RADIOGRAPH OF THE RIGHT AXILLA COMPARISON:  Previous exam(s). FINDINGS: Status post excision of the right axilla. The radioactive seed and biopsy marker clip are present, completely intact, and were marked for pathology. Results were called to the OR at the time of dictation. IMPRESSION: Specimen radiograph of the right axilla. Electronically Signed   By: Marin Olp M.D.   On: 07/02/2018 09:19   Mm Breast Surgical Specimen  Result Date: 07/02/2018 CLINICAL DATA:  Patient is post radioactive seed localization subsequent surgical excision right breast. EXAM: SPECIMEN RADIOGRAPH OF THE RIGHT BREAST COMPARISON:  Previous exam(s). FINDINGS: Status post excision of the right breast. The radioactive seed and ribbon shaped biopsy marker clip are present, completely intact, and were marked for pathology. Results were called to the OR at the time of dictation. IMPRESSION: Specimen radiograph of the right breast. Electronically Signed   By: Marin Olp M.D.   On: 07/02/2018 08:48     Not all labs, radiology exams or other studies done during hospitalization come through on my EPIC note; however they are reviewed by me.  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, estrogen receptor positive/metastatic to axillary node 2/3 nodes positive-status post lumpectomy and lymph node section without issues SNF- admitted for OT/PT  Pseudoseizures SNF- patient had 1 on admission to facility; continue clonazepam 1 mg 3 times daily  Hypertension SNF-controlled; continue Norvasc 5 mg and atenolol 25 mg both daily  Depression SNF- appears stable; continue Lexapro 20 mg daily  Hypothyroidism SNF-not stated as uncontrolled; continue Synthroid 50 mcg daily   Time spent greater than 45  minutes;> 50% of time with patient was spent reviewing records, labs, tests and studies, counseling and developing plan of care  Webb Silversmith D. Ashley Coil, MD

## 2018-07-09 ENCOUNTER — Non-Acute Institutional Stay (SKILLED_NURSING_FACILITY): Payer: Medicare Other | Admitting: Internal Medicine

## 2018-07-09 DIAGNOSIS — N61 Mastitis without abscess: Secondary | ICD-10-CM

## 2018-07-10 ENCOUNTER — Encounter: Payer: Self-pay | Admitting: Internal Medicine

## 2018-07-12 ENCOUNTER — Encounter: Payer: Self-pay | Admitting: Internal Medicine

## 2018-07-12 NOTE — Progress Notes (Signed)
Location:   [facility] of Service:   SNF Provider: Hennie Duos MD  London Pepper, MD  Patient Care Team: London Pepper, MD as PCP - General (Family Medicine) Alvester Chou, NP (Nurse Practitioner)  Extended Emergency Contact Information Primary Emergency Contact: [name] [place] Phone: [phone] Relation: Daughter Secondary Emergency Contact: Esson,bobby Mobile Phone: [phone] Relation: Son    Allergies: Codeine  Chief Complaint  Patient presents with  . Acute Visit    HPI: Patient is 72 y.o. female who wound care nursing asked me to see.  She thinks patient may have some cellulitis around the incision under her right breast.  No reported fever chills nausea vomiting.  Patient does complain of a little bit of increased pain especially with palpation along the lateral border of the lower incision.  Past Medical History:  Diagnosis Date  . Anemia   . Anxiety   . Arthritis   . Bladder incontinence   . Bowel incontinence   . Breast cancer ([place]) 08/16/13   left, 1 o'clock  . Chronic cystitis   . Chronic diarrhea   . Complication of anesthesia    hard to wake up  . Depression   . Full dentures   . GERD (gastroesophageal reflux disease)   . High blood pressure   . History of radiation therapy 11/02/13- 12/24/13   left breast 4680 cGy in 26 sessions, left breast boost 1400 cGy in 7 sessions  . HOH (hard of hearing)   . Hx of colonic polyps 12/03/2016  . Hypothyroidism   . Personal history of radiation therapy   . Pneumonia    hx  . Pseudoseizures   . Shortness of breath    when walks  . Sleep apnea    has a cpap-5 hr/ not used in years  . Stroke ([place]) 1980   legs weak  . Tremor   . Urinary incontinence   . Vertigo     Past Surgical History:  Procedure Laterality Date  . ABDOMINAL HYSTERECTOMY    . BACK SURGERY  2005   lumbar fusion  . BREAST LUMPECTOMY Left 2015  . BREAST LUMPECTOMY Right 07/02/2018   BREAST LUMPECTOMY WITH RADIOACTIVE SEED WITH RIGHT SEED TARGETED LYMPH NODE BIOPSY AND SENTINEL LYMPH NODE BIOPSY (Right Breast)  . BREAST LUMPECTOMY WITH NEEDLE LOCALIZATION AND AXILLARY SENTINEL LYMPH NODE BX Left 09/29/2013   Procedure: BREAST LUMPECTOMY WITH NEEDLE LOCALIZATION AND AXILLARY SENTINEL LYMPH NODE BX;  Surgeon: [name], MD;  Location: [facility];  Service: General;  Laterality: Left;  clean wound class  . BREAST LUMPECTOMY WITH RADIOACTIVE SEED AND AXILLARY LYMPH NODE DISSECTION Right 07/02/2018   Procedure: BREAST LUMPECTOMY WITH RADIOACTIVE SEED WITH RIGHT SEED TARGETED LYMPH NODE BIOPSY AND SENTINEL LYMPH NODE BIOPSY;  Surgeon: [name], MD;  Location: [facility];  Service: General;  Laterality: Right;  . COLONOSCOPY    . MULTIPLE TOOTH EXTRACTIONS    . ORIF WRIST FRACTURE Left 07/08/2015   Procedure: OPEN REDUCTION INTERNAL FIXATION (ORIF) LEFT WRIST FRACTURE AND REPAIR AS INDICATED;  Surgeon: Iran Planas, MD;  Location: [facility];  Service: Orthopedics;  Laterality: Left;  . ovarian cysts    . TOTAL HIP ARTHROPLASTY  2005   rt total hip    Allergies as of 07/09/2018      Reactions   Codeine Nausea And Vomiting      Medication List        Accurate as of 07/09/18  11:59 PM. Always use your most recent med list.          acetaminophen 325 MG tablet Commonly known as:  TYLENOL Take 2 tablets (650 mg total) by mouth every 6 (six) hours as needed for mild pain, fever or headache (or Fever >/= 101).   albuterol 108 (90 Base) MCG/ACT inhaler Commonly known as:  PROVENTIL HFA;VENTOLIN HFA Inhale 2 puffs into the lungs every 6 (six) hours as needed for wheezing or shortness of breath.   amLODipine 5 MG tablet Commonly known as:  NORVASC Take 5 mg by mouth daily.   atenolol 25 MG tablet Commonly known as:  TENORMIN Take 25 mg by mouth at bedtime.   clonazePAM 1 MG tablet Commonly known as:  KLONOPIN Take 1 mg by mouth 3 (three) times daily.     escitalopram 20 MG tablet Commonly known as:  LEXAPRO Take 20 mg by mouth at bedtime.   fluticasone 50 MCG/ACT nasal spray Commonly known as:  FLONASE Place 1 spray into both nostrils daily as needed for allergies.   guaiFENesin-dextromethorphan 100-10 MG/5ML syrup Commonly known as:  ROBITUSSIN DM Take 5 mLs by mouth every 4 (four) hours as needed for cough.   IMODIUM PO Take 1 tablet by mouth daily as needed (diarrhea).   levothyroxine 50 MCG tablet Commonly known as:  SYNTHROID, LEVOTHROID Take 50 mcg by mouth daily before breakfast.   miconazole 2 % cream Commonly known as:  MICOTIN Apply topically 2 (two) times daily.   montelukast 10 MG tablet Commonly known as:  SINGULAIR Take 10 mg by mouth at bedtime.   naproxen sodium 220 MG tablet Commonly known as:  ALEVE Take 440 mg by mouth 2 (two) times daily as needed (pain).   traMADol 50 MG tablet Commonly known as:  ULTRAM Take 1 tablet (50 mg total) by mouth every 6 (six) hours as needed for moderate pain or severe pain.       No orders of the defined types were placed in this encounter.   Immunization History  Administered Date(s) Administered  . Influenza,inj,Quad PF,6+ Mos 07/09/2015    Social History   Tobacco Use  . Smoking status: Never Smoker  . Smokeless tobacco: Never Used  Substance Use Topics  . Alcohol use: No    Review of Systems  DATA OBTAINED: from patient, nurse GENERAL:  no fevers, fatigue, appetite changes SKIN: No itching, rash HEENT: No complaint RESPIRATORY: No cough, wheezing, SOB CARDIAC: No chest pain, palpitations, lower extremity edema  GI: No abdominal pain, No N/V/D or constipation, No heartburn or reflux  GU: No dysuria, frequency or urgency, or incontinence  MUSCULOSKELETAL: No unrelieved bone/joint pain NEUROLOGIC: No headache, dizziness  PSYCHIATRIC: No overt anxiety or sadness  Vitals:   07/12/18 1843  BP: (!) 143/89  Pulse: 60  Resp: 18  Temp: (!) 97 F  (36.1 C)   There is no height or weight on file to calculate BMI. Physical Exam  GENERAL APPEARANCE: Alert, conversant, No acute distress  SKIN: Right breast with several incision sites with sutures with yellowish coloring consistent with resolving bruise; along the lateral inferior border of the lower incision there is some pain to palpation which patient did not have earlier in some heat HEENT: Unremarkable RESPIRATORY: Breathing is even, unlabored. Lung sounds are clear   CARDIOVASCULAR: Heart RRR no murmurs, rubs or gallops. No peripheral edema  GASTROINTESTINAL: Abdomen is soft, non-tender, not distended w/ normal bowel sounds.  GENITOURINARY: Bladder non tender, not distended  MUSCULOSKELETAL: No abnormal joints or musculature NEUROLOGIC: Cranial nerves 2-12 grossly intact. Moves all extremities PSYCHIATRIC: Mood and affect appropriate to situation, no behavioral issues  Patient Active Problem List   Diagnosis Date Noted  . Breast cancer metastasized to axillary lymph node, right ([place]) 07/02/2018  . Malignant neoplasm of upper-outer quadrant of right breast in female, estrogen receptor positive ([facility]) 06/17/2018  . Vertigo 12/23/2017  . Hypertension 12/23/2017  . Essential hypertension, benign 12/05/2017  . Chronic allergic rhinitis 12/05/2017  . Hypothyroidism due to acquired atrophy of thyroid 12/05/2017  . GERD without esophagitis 12/05/2017  . Arthritis/arthropathy of multiple joints 12/05/2017  . Aneurysm of descending thoracic aorta ([facility]) 12/05/2017  . Atypical pneumonia 11/29/2017  . Dyspnea on exertion 10/27/2017  . Dyslipidemia 10/27/2017  . Morbid obesity ([place]) 10/27/2017  . Hx of colonic polyps 12/03/2016  . Lactose intolerance 07/26/2016  . Tremor 01/29/2016  . Incontinence 08/06/2015  . Bacteremia   . Sepsis secondary to UTI ([place]) 07/25/2015  . Sleep apnea in adult   . Distal radius fracture, left 07/08/2015  . MDD (major depressive disorder), recurrent  severe, without psychosis ([place]) 03/21/2015  . 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 Breast cancer of upper-outer quadrant of left female breast ([place]) 08/18/2013    CMP     Component Value Date/Time   NA 139 07/04/2018 0426   NA 144 09/01/2013 1221   K 4.5 07/04/2018 0426   K 4.2 09/01/2013 1221   CL 104 07/04/2018 0426   CO2 30 07/04/2018 0426   CO2 30 (H) 09/01/2013 1221   GLUCOSE 107 (H) 07/04/2018 0426   GLUCOSE 82 09/01/2013 1221   BUN 36 (H) 07/04/2018 0426   BUN 18.8 09/01/2013 1221   CREATININE 1.52 (H) 07/04/2018 0426   CREATININE 1.3 (H) 09/01/2013 1221   CALCIUM 8.2 (L) 07/04/2018 0426   CALCIUM 9.2 09/01/2013 1221   PROT 6.6 11/28/2017 2223   PROT 7.1 09/01/2013 1221   ALBUMIN 3.5 11/28/2017 2223   ALBUMIN 3.7 09/01/2013 1221   AST 24 11/28/2017 2223   AST 17 09/01/2013 1221   ALT 17 11/28/2017 2223   ALT 16 09/01/2013 1221   ALKPHOS 42 11/28/2017 2223   ALKPHOS 48 09/01/2013 1221   BILITOT 0.9 11/28/2017 2223   BILITOT 0.78 09/01/2013 1221   GFRNONAA 33 (L) 07/04/2018 0426   GFRAA 38 (L) 07/04/2018 0426   Recent Labs    12/02/17 0600 06/25/18 1430 07/03/18 0811 07/04/18 0426  NA 141 139 139 139  K 4.1 4.2 4.7 4.5  CL 107 104 105 104  CO2 _0 GLUCOSE 130* 121* 127* 107*  BUN 32* 20 30* 36*  CREATININE 0.99 1.63* 1.47* 1.52*  CALCIUM 8.5* 8.7* 8.3* 8.2*  MG 2.2  --   --   --    Recent Labs    11/28/17 2223  AST 24  ALT 17  ALKPHOS 42  BILITOT 0.9  PROT 6.6  ALBUMIN 3.5   Recent Labs    11/28/17 2223  06/25/18 1430 07/03/18 0811 07/04/18 0426  WBC 7.2   < > 5.3 11.7* 7.6  NEUTROABS 5.2  --   --   --   --   HGB 14.8   < > 14.7 13.2 12.7  HCT 45.8   < > 47.8* 42.8 42.4  MCV 100.0   < > 98.2 101.7* 102.9*  PLT 129*   < > 142* 125* 116*   < > = values in this interval not displayed.  No results for input(s): CHOL, LDLCALC, TRIG in the last 8760 hours.  Invalid input(s): HCL No results found for: MICROALBUR No results found for:  TSH No results found for: HGBA1C No results found for: CHOL, HDL, LDLCALC, LDLDIRECT, TRIG, CHOLHDL  Significant Diagnostic Results in last 30 days:  Nm Bone Scan Whole Body  Result Date: 06/26/2018 CLINICAL DATA:  Breast cancer, back pain, lymph node positive disease question osseous metastasis EXAM: NUCLEAR MEDICINE WHOLE BODY BONE SCAN TECHNIQUE: Whole body anterior and posterior images were obtained approximately 3 hours after intravenous injection of radiopharmaceutical. RADIOPHARMACEUTICALS:  19 mCi Technetium-31mMDP IV which may COMPARISON:  None Radiographic correlation: CT abdomen and pelvis 05/11/2018 FINDINGS: Uptake at the shoulders, sternoclavicular joints, LEFT wrist, and both knees, typically degenerative. Uptake at the LEFT lateral aspect of the cervical spine typically degenerative. Uptake at approximately L1 and L2 vertebral bodies; patient has degenerative disc disease changes at both L1-L2 and L2-L3 yeah as well as prior lower lumbar fusion L3-L5 on recent CT. No additional sites of worrisome osseous tracer accumulation are identified. Expected urinary tract and soft tissue distribution of tracer. IMPRESSION: Multiple scattered sites of likely degenerative type uptake with additional tracer localization at the L1 and L2 vertebral bodies, corresponding to levels of degenerative disc disease changes on most recent CT. No other definite scintigraphic abnormalities identified. Electronically Signed   By: MLavonia DanaM.D.   On: 06/26/2018 14:04   Nm Sentinel Node Inj-no Rpt (breast)  Result Date: 07/02/2018 Sulfur colloid was injected by the nuclear medicine technologist for melanoma sentinel node.   Mm Breast Surgical Specimen  Result Date: 07/02/2018 CLINICAL DATA:  Patient is post radioactive seed localization subsequent surgical excision of right axillary lymph node. EXAM: SPECIMEN RADIOGRAPH OF THE RIGHT AXILLA COMPARISON:  Previous exam(s). FINDINGS: Status post excision of the  right axilla. The radioactive seed and biopsy marker clip are present, completely intact, and were marked for pathology. Results were called to the OR at the time of dictation. IMPRESSION: Specimen radiograph of the right axilla. Electronically Signed   By: DMarin OlpM.D.   On: 07/02/2018 09:19   Mm Breast Surgical Specimen  Result Date: 07/02/2018 CLINICAL DATA:  Patient is post radioactive seed localization subsequent surgical excision right breast. EXAM: SPECIMEN RADIOGRAPH OF THE RIGHT BREAST COMPARISON:  Previous exam(s). FINDINGS: Status post excision of the right breast. The radioactive seed and ribbon shaped biopsy marker clip are present, completely intact, and were marked for pathology. Results were called to the OR at the time of dictation. IMPRESSION: Specimen radiograph of the right breast. Electronically Signed   By: DMarin OlpM.D.   On: 07/02/2018 08:48   Dg Chest Port 1 View  Result Date: 07/05/2018 CLINICAL DATA:  Shortness of breath. EXAM: PORTABLE CHEST 1 VIEW COMPARISON:  Radiographs of November 28, 2017. FINDINGS: Stable cardiomegaly. Atherosclerosis of thoracic aorta is noted. No pneumothorax or pleural effusion is noted. No acute pulmonary disease is noted. Bony thorax is unremarkable. IMPRESSION: No acute cardiopulmonary abnormality seen. Aortic Atherosclerosis (ICD10-I70.0). Electronically Signed   By: JMarijo Conception M.D.   On: 07/05/2018 14:22   [place] Radioactive Seed Loc  Result Date: 06/30/2018 CLINICAL DATA:  Preoperative radioactive seed localization within a right breast 9:30 o'clock mass and a previously biopsy-proven malignant right axillary lymph node. EXAM: ULTRASOUND GUIDED RADIOACTIVE SEED LOCALIZATION OF THE RIGHT BREAST AND RIGHT AXILLA COMPARISON:  Previous exam(s). FINDINGS: Patient presents for radioactive seed localization prior to right breast lumpectomy  and right axillary lymph node dissection. I met with the patient and we discussed the procedure of  seed localization including benefits and alternatives. We discussed the high likelihood of a successful procedure. We discussed the risks of the procedure including infection, bleeding, tissue injury and further surgery. We discussed the low dose of radioactivity involved in the procedure. Informed, written consent was given. The usual time-out protocol was performed immediately prior to the procedure. Using ultrasound guidance, sterile technique, 1% lidocaine and an I-125 radioactive seed, biopsy-proven malignant right axillary lymph node was localized using a inferior approach. Follow-up survey of the patient confirms presence of the radioactive seed. Order number of I-125 seed:  [id number]. Total activity:  6.761 millicurie reference Date: June 19, 2018 Using ultrasound guidance, sterile technique, 1% lidocaine and an I-125 radioactive seed, right breast 9:30 o'clock malignancy was localized using a inferior approach. Follow-up survey of the patient confirms presence of the radioactive seed. Order number of I-125 seed:  [id number]. Total activity:  2.458 millicurie reference Date: June 19, 2018 The follow-up mammogram images confirm the seed in the expected location and were marked for Dr. Barry Dienes. The patient tolerated the procedure well and was released from the [facility]. She was given instructions regarding seed removal. IMPRESSION: Radioactive seed localization right breast and right axilla. No apparent complications. Electronically Signed   By: Fidela Salisbury M.D.   On: 06/30/2018 14:19   [place] Rt Radioactive Seed Loc  Result Date: 06/30/2018 CLINICAL DATA:  Preoperative radioactive seed localization within a right breast 9:30 o'clock mass and a previously biopsy-proven malignant right axillary lymph node. EXAM: ULTRASOUND GUIDED RADIOACTIVE SEED LOCALIZATION OF THE RIGHT BREAST AND RIGHT AXILLA COMPARISON:  Previous exam(s). FINDINGS: Patient presents for radioactive seed localization prior to  right breast lumpectomy and right axillary lymph node dissection. I met with the patient and we discussed the procedure of seed localization including benefits and alternatives. We discussed the high likelihood of a successful procedure. We discussed the risks of the procedure including infection, bleeding, tissue injury and further surgery. We discussed the low dose of radioactivity involved in the procedure. Informed, written consent was given. The usual time-out protocol was performed immediately prior to the procedure. Using ultrasound guidance, sterile technique, 1% lidocaine and an I-125 radioactive seed, biopsy-proven malignant right axillary lymph node was localized using a inferior approach. Follow-up survey of the patient confirms presence of the radioactive seed. Order number of I-125 seed:  [id number]. Total activity:  0.539 millicurie reference Date: June 19, 2018 Using ultrasound guidance, sterile technique, 1% lidocaine and an I-125 radioactive seed, right breast 9:30 o'clock malignancy was localized using a inferior approach. Follow-up survey of the patient confirms presence of the radioactive seed. Order number of I-125 seed:  [id number]. Total activity:  9.024 millicurie reference Date: June 19, 2018 The follow-up mammogram images confirm the seed in the expected location and were marked for Dr. Barry Dienes. The patient tolerated the procedure well and was released from the [facility]. She was given instructions regarding seed removal. IMPRESSION: Radioactive seed localization right breast and right axilla. No apparent complications. Electronically Signed   By: Fidela Salisbury M.D.   On: 06/30/2018 14:19   Mm Clip Placement Right  Result Date: 06/30/2018 CLINICAL DATA:  Post ultrasound-guided radioactive seed placement within the right breast 9:30 o'clock biopsy-proven malignancy, and biopsy-proven malignant right axillary lymph node. EXAM: DIAGNOSTIC RIGHT MAMMOGRAM POST ULTRASOUND  BIOPSY COMPARISON:  Previous exam(s). FINDINGS: Mammographic images were obtained following ultrasound guided placement of  radioactive seeds within the right breast 9:30 o'clock biopsy-proven malignancy, and biopsy-proven malignant right axillary lymph node. Two-view mammography demonstrates presence of a radioactive seed within the right breast 9:30 o'clock mass, immediately adjacent to the ribbon shaped post biopsy marker, and a radioactive seed within a right axillary lymph node, 2-3 mm from the spiral shaped post biopsy marker. At least 2 other abnormal lymph nodes are located in immediate proximity to the biopsy-proven malignant right axillary lymph node. IMPRESSION: Successful placement of radioactive seed within right breast 9:30 o'clock mass and a malignant right axillary lymph node. Final Assessment: Post Procedure Mammograms for Marker Placement Electronically Signed   By: Fidela Salisbury M.D.   On: 06/30/2018 14:32    Assessment and Plan  Early cellulitis right breast incision site- Keflex 500 mg 3 times daily for 7 days; will monitor progress    Inocencio Homes, MD

## 2018-07-13 ENCOUNTER — Inpatient Hospital Stay: Payer: Medicare Other | Attending: Hematology and Oncology | Admitting: Hematology and Oncology

## 2018-07-13 NOTE — Assessment & Plan Note (Deleted)
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 I discussed the margins as well as lymph node surgeries. We also discussed the final staging along with previously performed ER/PR and HER-2/neu testing. Standard of care for her would be to complete axillary lymph node dissection.  However because of her health issues, we elected to radiate her instead.  Recommendation: 1. Because the patient is relatively frail with multiple medical conditions, I do not recommend Mammaprint testing because she is not a candidate for chemotherapy. 3.  Followed by radiation 4.  Followed by antiestrogen therapy  Return to clinic after radiation to begin antiestrogen therapy

## 2018-07-14 LAB — CBC AND DIFFERENTIAL
HEMATOCRIT: 41 (ref 36–46)
HEMOGLOBIN: 13.9 (ref 12.0–16.0)
PLATELETS: 134 — AB (ref 150–399)
WBC: 5.9

## 2018-07-14 LAB — BASIC METABOLIC PANEL
BUN: 22 — AB (ref 4–21)
Creatinine: 0.8 (ref 0.5–1.1)
GLUCOSE: 91
Potassium: 4.7 (ref 3.4–5.3)
Sodium: 142 (ref 137–147)

## 2018-07-16 ENCOUNTER — Ambulatory Visit
Admission: RE | Admit: 2018-07-16 | Discharge: 2018-07-16 | Disposition: A | Discharge status: Home / Self Care | Payer: Medicare Other | Source: Ambulatory Visit | Attending: Radiation Oncology | Admitting: Radiation Oncology

## 2018-07-16 ENCOUNTER — Ambulatory Visit: Payer: Medicare Other

## 2018-07-20 ENCOUNTER — Non-Acute Institutional Stay (SKILLED_NURSING_FACILITY): Payer: Medicare Other | Admitting: Internal Medicine

## 2018-07-20 ENCOUNTER — Encounter: Payer: Self-pay | Admitting: Internal Medicine

## 2018-07-20 DIAGNOSIS — I10 Essential (primary) hypertension: Secondary | ICD-10-CM

## 2018-07-20 DIAGNOSIS — F445 Conversion disorder with seizures or convulsions: Secondary | ICD-10-CM

## 2018-07-20 DIAGNOSIS — C773 Secondary and unspecified malignant neoplasm of axilla and upper limb lymph nodes: Secondary | ICD-10-CM

## 2018-07-20 DIAGNOSIS — C50412 Malignant neoplasm of upper-outer quadrant of left female breast: Secondary | ICD-10-CM | POA: Diagnosis not present

## 2018-07-20 DIAGNOSIS — C50911 Malignant neoplasm of unspecified site of right female breast: Secondary | ICD-10-CM

## 2018-07-20 DIAGNOSIS — F332 Major depressive disorder, recurrent severe without psychotic features: Secondary | ICD-10-CM

## 2018-07-20 DIAGNOSIS — K219 Gastro-esophageal reflux disease without esophagitis: Secondary | ICD-10-CM

## 2018-07-20 DIAGNOSIS — Z170 Estrogen receptor positive status [ER+]: Secondary | ICD-10-CM

## 2018-07-20 DIAGNOSIS — R569 Unspecified convulsions: Secondary | ICD-10-CM

## 2018-07-20 DIAGNOSIS — E034 Atrophy of thyroid (acquired): Secondary | ICD-10-CM

## 2018-07-20 NOTE — Progress Notes (Signed)
Location:  [facility] Room Number: [phone] Place of Service:  SNF [phone])  [name]. Ashley Coil, MD  Patient Care Team: London Pepper, MD as PCP - General (Family Medicine) Alvester Chou, NP (Nurse Practitioner) Kyung Rudd, MD as Consulting Physician (Radiation Oncology) Nicholas Lose, MD as Consulting Physician (Hematology and Oncology) [name], MD as Consulting Physician (General Surgery) Causey, Charlestine Massed, NP as Nurse Practitioner (Hematology and Oncology)  Extended Emergency Contact Information Primary Emergency Contact: [name] [place] Phone: [phone] Relation: Daughter Secondary Emergency Contact: Suderman,bobby Mobile Phone: [phone] Relation: Son  Allergies  Allergen Reactions  . Codeine Nausea And Vomiting    Chief Complaint  Patient presents with  . Discharge Note    Discharge from [facility]    HPI:  72 y.o. female with vertigo, hypertension, hypothyroidism, GERD, aneurysm of thoracic aorta, pseudoseizures, and new onset breast cancer who was admitted to [facility] from 11/7-12 for a planned right seed localized lumpectomy, seed targeted lymph node biopsy and sentinel lymph node biopsy right axilla on 11/7.  She was shaking and week postop day 1 and requested to stay.  She has had pseudoseizures during her stay and her Klonopin was adjusted to help with this.  She does not have any issues related to her breast surgery.  Path came back and she had negative margins, 2 out of 3 lymph nodes positive.  Patient was admitted to skilled nursing facility for OT/PT and is now ready to be discharged to home.    Past Medical History:  Diagnosis Date  . Anemia   . Anxiety   . Arthritis   . Bladder incontinence   . Bowel incontinence   . Breast cancer ([place]) 08/16/13   left, 1 o'clock  . Chronic cystitis   . Chronic diarrhea   . Complication of anesthesia    hard to wake up  . Depression     . Full dentures   . GERD (gastroesophageal reflux disease)   . High blood pressure   . History of radiation therapy 11/02/13- 12/24/13   left breast 4680 cGy in 26 sessions, left breast boost 1400 cGy in 7 sessions  . HOH (hard of hearing)   . Hx of colonic polyps 12/03/2016  . Hypothyroidism   . Personal history of radiation therapy   . Pneumonia    hx  . Pseudoseizures   . Shortness of breath    when walks  . Sleep apnea    has a cpap-5 hr/ not used in years  . Stroke ([place]) 1980   legs weak  . Tremor   . Urinary incontinence   . Vertigo     Past Surgical History:  Procedure Laterality Date  . ABDOMINAL HYSTERECTOMY    . BACK SURGERY  2005   lumbar fusion  . BREAST LUMPECTOMY Left 2015  . BREAST LUMPECTOMY Right 07/02/2018   BREAST LUMPECTOMY WITH RADIOACTIVE SEED WITH RIGHT SEED TARGETED LYMPH NODE BIOPSY AND SENTINEL LYMPH NODE BIOPSY (Right Breast)  . BREAST LUMPECTOMY WITH NEEDLE LOCALIZATION AND AXILLARY SENTINEL LYMPH NODE BX Left 09/29/2013   Procedure: BREAST LUMPECTOMY WITH NEEDLE LOCALIZATION AND AXILLARY SENTINEL LYMPH NODE BX;  Surgeon: [name], MD;  Location: [facility];  Service: General;  Laterality: Left;  clean wound class  . BREAST LUMPECTOMY WITH RADIOACTIVE SEED AND AXILLARY LYMPH NODE DISSECTION Right 07/02/2018   Procedure: BREAST LUMPECTOMY WITH RADIOACTIVE SEED WITH RIGHT SEED TARGETED LYMPH NODE  BIOPSY AND SENTINEL LYMPH NODE BIOPSY;  Surgeon: [name], MD;  Location: [facility];  Service: General;  Laterality: Right;  . COLONOSCOPY    . MULTIPLE TOOTH EXTRACTIONS    . ORIF WRIST FRACTURE Left 07/08/2015   Procedure: OPEN REDUCTION INTERNAL FIXATION (ORIF) LEFT WRIST FRACTURE AND REPAIR AS INDICATED;  Surgeon: Iran Planas, MD;  Location: [place];  Service: Orthopedics;  Laterality: Left;  . ovarian cysts    . TOTAL HIP ARTHROPLASTY  2005   rt total hip     reports that she has never smoked. She has never used smokeless tobacco. She  reports that she does not drink alcohol or use drugs. Social History   Socioeconomic History  . Marital status: Widowed    Spouse name: Not on file  . Number of children: 2  . Years of education: 10th grade  . Highest education level: Not on file  Occupational History  . Occupation: Retired  Scientific laboratory technician  . Financial resource strain: Not on file  . Food insecurity:    Worry: Not on file    Inability: Not on file  . Transportation needs:    Medical: Not on file    Non-medical: Not on file  Tobacco Use  . Smoking status: Never Smoker  . Smokeless tobacco: Never Used  Substance and Sexual Activity  . Alcohol use: No  . Drug use: No  . Sexual activity: Not Currently    Comment: menarche age 39, first live birth age 43, P110,  contraception x 40 years, menopause 17  Lifestyle  . Physical activity:    Days per week: Not on file    Minutes per session: Not on file  . Stress: Not on file  Relationships  . Social connections:    Talks on phone: Not on file    Gets together: Not on file    Attends religious service: Not on file    Active member of club or organization: Not on file    Attends meetings of clubs or organizations: Not on file    Relationship status: Not on file  . Intimate partner violence:    Fear of current or ex partner: Not on file    Emotionally abused: Not on file    Physically abused: Not on file    Forced sexual activity: Not on file  Other Topics Concern  . Not on file  Social History Narrative   Lives at home alone.   Right-handed.   1 cup caffeine per day.    Pertinent  Health Maintenance Due  Topic Date Due  . INFLUENZA VACCINE  03/26/2018  . MAMMOGRAM  05/21/2020  . COLONOSCOPY  11/26/2021  . DEXA SCAN  Completed  . PNA vac Low Risk Adult  Discontinued    Medications: Allergies as of 07/20/2018      Reactions   Codeine Nausea And Vomiting      Medication List        Accurate as of 07/20/18 11:59 PM. Always use your most recent med  list.          acetaminophen 325 MG tablet Commonly known as:  TYLENOL Take 2 tablets (650 mg total) by mouth every 6 (six) hours as needed for mild pain, fever or headache (or Fever >/= 101).   albuterol 108 (90 Base) MCG/ACT inhaler Commonly known as:  PROVENTIL HFA;VENTOLIN HFA Inhale 2 puffs into the lungs every 6 (six) hours as needed for wheezing or shortness of breath.   amLODipine  10 MG tablet Commonly known as:  NORVASC Take 1 tablet (10 mg total) by mouth daily.   atenolol 25 MG tablet Commonly known as:  TENORMIN Take 1 tablet (25 mg total) by mouth at bedtime.   clonazePAM 1 MG tablet Commonly known as:  KLONOPIN Take 1 tablet (1 mg total) by mouth 3 (three) times daily.   doxycycline 100 MG EC tablet Commonly known as:  DORYX Take 1 tablet (100 mg total) by mouth 2 (two) times daily. 1 tab po bid x 7 days right breast surgical incision wound ; stop date 11/29   escitalopram 20 MG tablet Commonly known as:  LEXAPRO Take 1 tablet (20 mg total) by mouth at bedtime.   fluticasone 50 MCG/ACT nasal spray Commonly known as:  FLONASE Place 1 spray into both nostrils daily as needed for allergies.   guaiFENesin-dextromethorphan 100-10 MG/5ML syrup Commonly known as:  ROBITUSSIN DM Take 5 mLs by mouth every 4 (four) hours as needed for cough.   IMODIUM PO Take 2 mg by mouth daily as needed (diarrhea).   levothyroxine 50 MCG tablet Commonly known as:  SYNTHROID, LEVOTHROID Take 1 tablet (50 mcg total) by mouth daily before breakfast.   montelukast 10 MG tablet Commonly known as:  SINGULAIR Take 1 tablet (10 mg total) by mouth at bedtime.   naproxen sodium 220 MG tablet Commonly known as:  ALEVE Take 440 mg by mouth 2 (two) times daily as needed (pain).   traMADol 50 MG tablet Commonly known as:  ULTRAM Take 1 tablet (50 mg total) by mouth every 6 (six) hours as needed for moderate pain or severe pain.        Vitals:   07/20/18 1033  BP: (!) 148/80    Pulse: (!) 52  Resp: 19  Temp: 97.6 F (36.4 C)  Weight: 244 lb (110.7 kg)  Height: 5' 2"  (1.575 m)   Body mass index is 44.63 kg/m.  Physical Exam  GENERAL APPEARANCE: Alert, conversant. No acute distress.  HEENT: Unremarkable. RESPIRATORY: Breathing is even, unlabored. Lung sounds are clear   CARDIOVASCULAR: Heart RRR no murmurs, rubs or gallops. No peripheral edema.  GASTROINTESTINAL: Abdomen is soft, non-tender, not distended w/ normal bowel sounds.  NEUROLOGIC: Cranial nerves 2-12 grossly intact. Moves all extremities   Labs reviewed: Basic Metabolic Panel: Recent Labs    12/02/17 0600 06/25/18 1430 07/03/18 0811 07/04/18 0426 07/14/18  NA 141 139 139 139 142  K 4.1 4.2 4.7 4.5 4.7  CL 107 104 105 104  --   CO2 26 29 25 30   --   GLUCOSE 130* 121* 127* 107*  --   BUN 32* 20 30* 36* 22*  CREATININE 0.99 1.63* 1.47* 1.52* 0.8  CALCIUM 8.5* 8.7* 8.3* 8.2*  --   MG 2.2  --   --   --   --    No results found for: [facility] Liver Function Tests: Recent Labs    11/28/17 2223  AST 24  ALT 17  ALKPHOS 42  BILITOT 0.9  PROT 6.6  ALBUMIN 3.5   No results for input(s): LIPASE, AMYLASE in the last 8760 hours. No results for input(s): AMMONIA in the last 8760 hours. CBC: Recent Labs    11/28/17 2223  06/25/18 1430 07/03/18 0811 07/04/18 0426 07/14/18  WBC 7.2   < > 5.3 11.7* 7.6 5.9  NEUTROABS 5.2  --   --   --   --   --   HGB 14.8   < >  14.7 13.2 12.7 13.9  HCT 45.8   < > 47.8* 42.8 42.4 41  MCV 100.0   < > 98.2 101.7* 102.9*  --   PLT 129*   < > 142* 125* 116* 134*   < > = values in this interval not displayed.   Lipid No results for input(s): CHOL, HDL, LDLCALC, TRIG in the last 8760 hours. Cardiac Enzymes: Recent Labs    11/28/17 2223  TROPONINI 0.04*   BNP: Recent Labs    11/28/17 2223  BNP 24.9   CBG: No results for input(s): GLUCAP in the last 8760 hours.  Procedures and Imaging Studies During Stay: Nm Bone Scan Whole Body  Result  Date: 06/26/2018 CLINICAL DATA:  Breast cancer, back pain, lymph node positive disease question osseous metastasis EXAM: NUCLEAR MEDICINE WHOLE BODY BONE SCAN TECHNIQUE: Whole body anterior and posterior images were obtained approximately 3 hours after intravenous injection of radiopharmaceutical. RADIOPHARMACEUTICALS:  19 mCi Technetium-67mMDP IV which may COMPARISON:  None Radiographic correlation: CT abdomen and pelvis 05/11/2018 FINDINGS: Uptake at the shoulders, sternoclavicular joints, LEFT wrist, and both knees, typically degenerative. Uptake at the LEFT lateral aspect of the cervical spine typically degenerative. Uptake at approximately L1 and L2 vertebral bodies; patient has degenerative disc disease changes at both L1-L2 and L2-L3 yeah as well as prior lower lumbar fusion L3-L5 on recent CT. No additional sites of worrisome osseous tracer accumulation are identified. Expected urinary tract and soft tissue distribution of tracer. IMPRESSION: Multiple scattered sites of likely degenerative type uptake with additional tracer localization at the L1 and L2 vertebral bodies, corresponding to levels of degenerative disc disease changes on most recent CT. No other definite scintigraphic abnormalities identified. Electronically Signed   By: MLavonia DanaM.D.   On: 06/26/2018 14:04   Nm Sentinel Node Inj-no Rpt (breast)  Result Date: 07/02/2018 Sulfur colloid was injected by the nuclear medicine technologist for melanoma sentinel node.   Mm Breast Surgical Specimen  Result Date: 07/02/2018 CLINICAL DATA:  Patient is post radioactive seed localization subsequent surgical excision of right axillary lymph node. EXAM: SPECIMEN RADIOGRAPH OF THE RIGHT AXILLA COMPARISON:  Previous exam(s). FINDINGS: Status post excision of the right axilla. The radioactive seed and biopsy marker clip are present, completely intact, and were marked for pathology. Results were called to the OR at the time of dictation. IMPRESSION:  Specimen radiograph of the right axilla. Electronically Signed   By: DMarin OlpM.D.   On: 07/02/2018 09:19   Mm Breast Surgical Specimen  Result Date: 07/02/2018 CLINICAL DATA:  Patient is post radioactive seed localization subsequent surgical excision right breast. EXAM: SPECIMEN RADIOGRAPH OF THE RIGHT BREAST COMPARISON:  Previous exam(s). FINDINGS: Status post excision of the right breast. The radioactive seed and ribbon shaped biopsy marker clip are present, completely intact, and were marked for pathology. Results were called to the OR at the time of dictation. IMPRESSION: Specimen radiograph of the right breast. Electronically Signed   By: DMarin OlpM.D.   On: 07/02/2018 08:48   Dg Chest Port 1 View  Result Date: 07/05/2018 CLINICAL DATA:  Shortness of breath. EXAM: PORTABLE CHEST 1 VIEW COMPARISON:  Radiographs of November 28, 2017. FINDINGS: Stable cardiomegaly. Atherosclerosis of thoracic aorta is noted. No pneumothorax or pleural effusion is noted. No acute pulmonary disease is noted. Bony thorax is unremarkable. IMPRESSION: No acute cardiopulmonary abnormality seen. Aortic Atherosclerosis (ICD10-I70.0). Electronically Signed   By: JMarijo Conception M.D.   On: 07/05/2018 14:22  [place] Rt Radioactive Seed Loc  Result Date: 06/30/2018 CLINICAL DATA:  Preoperative radioactive seed localization within a right breast 9:30 o'clock mass and a previously biopsy-proven malignant right axillary lymph node. EXAM: ULTRASOUND GUIDED RADIOACTIVE SEED LOCALIZATION OF THE RIGHT BREAST AND RIGHT AXILLA COMPARISON:  Previous exam(s). FINDINGS: Patient presents for radioactive seed localization prior to right breast lumpectomy and right axillary lymph node dissection. I met with the patient and we discussed the procedure of seed localization including benefits and alternatives. We discussed the high likelihood of a successful procedure. We discussed the risks of the procedure including infection, bleeding,  tissue injury and further surgery. We discussed the low dose of radioactivity involved in the procedure. Informed, written consent was given. The usual time-out protocol was performed immediately prior to the procedure. Using ultrasound guidance, sterile technique, 1% lidocaine and an I-125 radioactive seed, biopsy-proven malignant right axillary lymph node was localized using a inferior approach. Follow-up survey of the patient confirms presence of the radioactive seed. Order number of I-125 seed:  [id number]. Total activity:  9.924 millicurie reference Date: June 19, 2018 Using ultrasound guidance, sterile technique, 1% lidocaine and an I-125 radioactive seed, right breast 9:30 o'clock malignancy was localized using a inferior approach. Follow-up survey of the patient confirms presence of the radioactive seed. Order number of I-125 seed:  [id number]. Total activity:  2.297 millicurie reference Date: June 19, 2018 The follow-up mammogram images confirm the seed in the expected location and were marked for Dr. Barry Dienes. The patient tolerated the procedure well and was released from the [facility]. She was given instructions regarding seed removal. IMPRESSION: Radioactive seed localization right breast and right axilla. No apparent complications. Electronically Signed   By: Fidela Salisbury M.D.   On: 06/30/2018 14:19   [place] Rt Radioactive Seed Loc  Result Date: 06/30/2018 CLINICAL DATA:  Preoperative radioactive seed localization within a right breast 9:30 o'clock mass and a previously biopsy-proven malignant right axillary lymph node. EXAM: ULTRASOUND GUIDED RADIOACTIVE SEED LOCALIZATION OF THE RIGHT BREAST AND RIGHT AXILLA COMPARISON:  Previous exam(s). FINDINGS: Patient presents for radioactive seed localization prior to right breast lumpectomy and right axillary lymph node dissection. I met with the patient and we discussed the procedure of seed localization including benefits and alternatives. We  discussed the high likelihood of a successful procedure. We discussed the risks of the procedure including infection, bleeding, tissue injury and further surgery. We discussed the low dose of radioactivity involved in the procedure. Informed, written consent was given. The usual time-out protocol was performed immediately prior to the procedure. Using ultrasound guidance, sterile technique, 1% lidocaine and an I-125 radioactive seed, biopsy-proven malignant right axillary lymph node was localized using a inferior approach. Follow-up survey of the patient confirms presence of the radioactive seed. Order number of I-125 seed:  [id number]. Total activity:  7.408 millicurie reference Date: June 19, 2018 Using ultrasound guidance, sterile technique, 1% lidocaine and an I-125 radioactive seed, right breast 9:30 o'clock malignancy was localized using a inferior approach. Follow-up survey of the patient confirms presence of the radioactive seed. Order number of I-125 seed:  [id number]. Total activity:  1.497 millicurie reference Date: June 19, 2018 The follow-up mammogram images confirm the seed in the expected location and were marked for Dr. Barry Dienes. The patient tolerated the procedure well and was released from the [facility]. She was given instructions regarding seed removal. IMPRESSION: Radioactive seed localization right breast and right axilla. No apparent complications. Electronically Signed   By: Fidela Salisbury  M.D.   On: 06/30/2018 14:19   Mm Clip Placement Right  Result Date: 06/30/2018 CLINICAL DATA:  Post ultrasound-guided radioactive seed placement within the right breast 9:30 o'clock biopsy-proven malignancy, and biopsy-proven malignant right axillary lymph node. EXAM: DIAGNOSTIC RIGHT MAMMOGRAM POST ULTRASOUND BIOPSY COMPARISON:  Previous exam(s). FINDINGS: Mammographic images were obtained following ultrasound guided placement of radioactive seeds within the right breast 9:30 o'clock  biopsy-proven malignancy, and biopsy-proven malignant right axillary lymph node. Two-view mammography demonstrates presence of a radioactive seed within the right breast 9:30 o'clock mass, immediately adjacent to the ribbon shaped post biopsy marker, and a radioactive seed within a right axillary lymph node, 2-3 mm from the spiral shaped post biopsy marker. At least 2 other abnormal lymph nodes are located in immediate proximity to the biopsy-proven malignant right axillary lymph node. IMPRESSION: Successful placement of radioactive seed within right breast 9:30 o'clock mass and a malignant right axillary lymph node. Final Assessment: Post Procedure Mammograms for Marker Placement Electronically Signed   By: Fidela Salisbury M.D.   On: 06/30/2018 14:32    Assessment/Plan:   Malignant neoplasm of upper-outer quadrant of left breast in female, estrogen receptor positive ([place])  Breast cancer metastasized to axillary lymph node, right ([facility])  Pseudoseizures  Essential hypertension, benign  Hypothyroidism due to acquired atrophy of thyroid  GERD without esophagitis  MDD (major depressive disorder), recurrent severe, without psychosis ([place])   Patient is being discharged with the following home health services: OT/PT/nursing/palliative care  Patient is being discharged with the following durable medical equipment: DME-possibly oxygen  Patient has been advised to f/u with their PCP in 1-2 weeks to bring them up to date on their rehab stay.  Social services at facility was responsible for arranging this appointment.  Pt was provided with a 30 day supply of prescriptions for medications and refills must be obtained from their PCP.  For controlled substances, a more limited supply may be provided adequate until PCP appointment only.  Medications have been reconciled.  I am spent greater than 30 minutes;> 50% of time with patient was spent reviewing records, labs, tests and studies, counseling and  developing plan of care  [name]. Ashley Coil, MD

## 2018-07-21 ENCOUNTER — Encounter: Payer: Self-pay | Admitting: Internal Medicine

## 2018-07-21 ENCOUNTER — Ambulatory Visit (INDEPENDENT_AMBULATORY_CARE_PROVIDER_SITE_OTHER): Payer: Medicare Other | Admitting: Neurology

## 2018-07-21 ENCOUNTER — Encounter

## 2018-07-21 ENCOUNTER — Encounter: Payer: Self-pay | Admitting: Neurology

## 2018-07-21 VITALS — BP 125/76 | HR 59 | Ht 62.0 in

## 2018-07-21 DIAGNOSIS — R251 Tremor, unspecified: Secondary | ICD-10-CM

## 2018-07-21 DIAGNOSIS — R202 Paresthesia of skin: Secondary | ICD-10-CM | POA: Diagnosis not present

## 2018-07-21 DIAGNOSIS — M542 Cervicalgia: Secondary | ICD-10-CM

## 2018-07-21 MED ORDER — AMLODIPINE BESYLATE 10 MG PO TABS: 10.0000 mg | ORAL_TABLET | Freq: Every day | ORAL | 0 refills | Status: AC

## 2018-07-21 MED ORDER — CLONAZEPAM 1 MG PO TABS: 1.0000 mg | ORAL_TABLET | Freq: Three times a day (TID) | ORAL | 0 refills | Status: AC

## 2018-07-21 MED ORDER — LEVOTHYROXINE SODIUM 50 MCG PO TABS: 50.0000 ug | ORAL_TABLET | Freq: Every day | ORAL | 0 refills | Status: AC

## 2018-07-21 MED ORDER — MONTELUKAST SODIUM 10 MG PO TABS: 10.0000 mg | ORAL_TABLET | Freq: Every day | ORAL | 0 refills | Status: AC

## 2018-07-21 MED ORDER — ESCITALOPRAM OXALATE 20 MG PO TABS: 20.0000 mg | ORAL_TABLET | Freq: Every day | ORAL | 0 refills | Status: AC

## 2018-07-21 MED ORDER — DOXYCYCLINE HYCLATE 100 MG PO TBEC: 100.0000 mg | DELAYED_RELEASE_TABLET | Freq: Two times a day (BID) | ORAL | 0 refills | Status: DC

## 2018-07-21 MED ORDER — FLUTICASONE PROPIONATE 50 MCG/ACT NA SUSP: 1.0000 | Freq: Every day | NASAL | 0 refills | Status: AC | PRN

## 2018-07-21 MED ORDER — ALBUTEROL SULFATE HFA 108 (90 BASE) MCG/ACT IN AERS: 2.0000 | INHALATION_SPRAY | Freq: Four times a day (QID) | RESPIRATORY_TRACT | 2 refills | Status: AC | PRN

## 2018-07-21 MED ORDER — ATENOLOL 25 MG PO TABS: 25.0000 mg | ORAL_TABLET | Freq: Every day | ORAL | 0 refills | Status: AC

## 2018-07-21 NOTE — Progress Notes (Signed)
PATIENT: [name] DOB: [birth year]  Chief Complaint  Patient presents with  . New Patient (Initial Visit)    Np for tremor and pseudoseizure. Alone. New room. Patient mentioned that she has been having tremors and pseudoseizures for 10 years but here recently it has gotten worse. She stated that her last seizure was 2 weeks ago.      HISTORICAL  [name] is a 72 years old female, seen in request by her primary care physician DrMorrow, Marjory Lies, MD for evaluation of seizure, initial evaluation was on July 21, 2018.  I have reviewed and summarized the referring note from the referring physician.  She had a past medical history of hypertension, depression, thyroid, on supplement  I was able to see discharge summary on July 05, 2018, she had a history of breast cancer metastasized to right axillary lymph node, previous history of left breast cancer, status post radiation therapy, right hip replacement.  She was admitted to the floor following right seed localized lumpectomy, seed targeted lymph node biopsy in the setting of lymph node biopsy of the right axillary region on July 02, 2018, was noted very shaky in the week postoperative, on postoperative day 1, she also reported dizziness, balance issues, was noted to have whole body tremorish, suggestive of pseudo seizure, was discharged to skilled nursing home [facility] on July 07, 2018.  Before this hospital admission, she lives by herself, she complains of stress in her life, depression anxiety, whole body shaking, jaw tremor, during her described seizure-like episode, she could hear people talking, but difficult for her to answering back, stuttered, she denied loss of consciousness.  She had gradual onset bilateral hands tremor since 2009, also had intermittent head titubation, jaw tremor.  Getting worse when she is stressful, feeling tired,  Laboratory evaluations showed normal CBC, hemoglobin of 14.9, CMP,  creatinine of 0.9,   She also complains of neck pain, radiating pain to bilateral shoulder, bilateral upper extremity paresthesia  REVIEW OF SYSTEMS: Full 14 system review of systems performed and notable only for swelling legs, hearing loss, ringing the ears, spinning sensation, weight gain, blurred vision, shortness of breath, cough, wheezing, snoring, incontinence, joint pain, feeling cold, allergy, runny nose, memory loss, headaches, weakness, seizure, depression, recent energy. All other review of systems were negative.      ALLERGIES: Allergies  Allergen Reactions  . Codeine Nausea And Vomiting    HOME MEDICATIONS: Current Outpatient Medications  Medication Sig Dispense Refill  . acetaminophen (TYLENOL) 325 MG tablet Take 2 tablets (650 mg total) by mouth every 6 (six) hours as needed for mild pain, fever or headache (or Fever >/= 101). 50 tablet 0  . albuterol (PROVENTIL HFA;VENTOLIN HFA) 108 (90 Base) MCG/ACT inhaler Inhale 2 puffs into the lungs every 6 (six) hours as needed for wheezing or shortness of breath. 1 Inhaler 2  . amLODipine (NORVASC) 10 MG tablet Take 10 mg by mouth daily.    Marland Kitchen atenolol (TENORMIN) 25 MG tablet Take 25 mg by mouth at bedtime.    . clonazePAM (KLONOPIN) 1 MG tablet Take 1 mg by mouth 3 (three) times daily.     Marland Kitchen doxycycline (DORYX) 100 MG EC tablet Take 100 mg by mouth. 1 tab po bid x 7 days right breast surgical incision wound    . escitalopram (LEXAPRO) 20 MG tablet Take 20 mg by mouth at bedtime.    . fluticasone (FLONASE) 50 MCG/ACT nasal spray Place 1 spray into both nostrils  daily as needed for allergies.     Marland Kitchen guaiFENesin-dextromethorphan (ROBITUSSIN DM) 100-10 MG/5ML syrup Take 5 mLs by mouth every 4 (four) hours as needed for cough. 118 mL 0  . levothyroxine (SYNTHROID, LEVOTHROID) 50 MCG tablet Take 50 mcg by mouth daily before breakfast.     . Loperamide HCl (IMODIUM PO) Take 2 mg by mouth daily as needed (diarrhea).     . montelukast  (SINGULAIR) 10 MG tablet Take 10 mg by mouth at bedtime.    . naproxen sodium (ANAPROX) 220 MG tablet Take 440 mg by mouth 2 (two) times daily as needed (pain).     . traMADol (ULTRAM) 50 MG tablet Take 1 tablet (50 mg total) by mouth every 6 (six) hours as needed for moderate pain or severe pain. 20 tablet 1  . miconazole (MICOTIN) 2 % cream Apply topically 2 (two) times daily. 28.35 g 0   No current facility-administered medications for this visit.     PAST MEDICAL HISTORY: Past Medical History:  Diagnosis Date  . Anemia   . Anxiety   . Arthritis   . Bladder incontinence   . Bowel incontinence   . Breast cancer ([place]) 08/16/13   left, 1 o'clock  . Chronic cystitis   . Chronic diarrhea   . Complication of anesthesia    hard to wake up  . Depression   . Full dentures   . GERD (gastroesophageal reflux disease)   . High blood pressure   . History of radiation therapy 11/02/13- 12/24/13   left breast 4680 cGy in 26 sessions, left breast boost 1400 cGy in 7 sessions  . HOH (hard of hearing)   . Hx of colonic polyps 12/03/2016  . Hypothyroidism   . Personal history of radiation therapy   . Pneumonia    hx  . Pseudoseizures   . Shortness of breath    when walks  . Sleep apnea    has a cpap-5 hr/ not used in years  . Stroke ([place]) 1980   legs weak  . Tremor   . Urinary incontinence   . Vertigo     PAST SURGICAL HISTORY: Past Surgical History:  Procedure Laterality Date  . ABDOMINAL HYSTERECTOMY    . BACK SURGERY  2005   lumbar fusion  . BREAST LUMPECTOMY Left 2015  . BREAST LUMPECTOMY Right 07/02/2018   BREAST LUMPECTOMY WITH RADIOACTIVE SEED WITH RIGHT SEED TARGETED LYMPH NODE BIOPSY AND SENTINEL LYMPH NODE BIOPSY (Right Breast)  . BREAST LUMPECTOMY WITH NEEDLE LOCALIZATION AND AXILLARY SENTINEL LYMPH NODE BX Left 09/29/2013   Procedure: BREAST LUMPECTOMY WITH NEEDLE LOCALIZATION AND AXILLARY SENTINEL LYMPH NODE BX;  Surgeon: [name], MD;  Location: [facility];  Service: General;  Laterality: Left;  clean wound class  . BREAST LUMPECTOMY WITH RADIOACTIVE SEED AND AXILLARY LYMPH NODE DISSECTION Right 07/02/2018   Procedure: BREAST LUMPECTOMY WITH RADIOACTIVE SEED WITH RIGHT SEED TARGETED LYMPH NODE BIOPSY AND SENTINEL LYMPH NODE BIOPSY;  Surgeon: [name], MD;  Location: [facility];  Service: General;  Laterality: Right;  . COLONOSCOPY    . MULTIPLE TOOTH EXTRACTIONS    . ORIF WRIST FRACTURE Left 07/08/2015   Procedure: OPEN REDUCTION INTERNAL FIXATION (ORIF) LEFT WRIST FRACTURE AND REPAIR AS INDICATED;  Surgeon: Iran Planas, MD;  Location: [facility];  Service: Orthopedics;  Laterality: Left;  . ovarian cysts    . TOTAL HIP ARTHROPLASTY  2005   rt total hip    FAMILY HISTORY: Family History  Problem Relation Age of Onset  . Lung cancer Brother   . Diabetes Brother   . Stroke Mother   . Ataxia Neg Hx   . Chorea Neg Hx   . Dementia Neg Hx   . Mental retardation Neg Hx   . Migraines Neg Hx   . Multiple sclerosis Neg Hx   . Neurofibromatosis Neg Hx   . Neuropathy Neg Hx   . Parkinsonism Neg Hx   . Seizures Neg Hx     SOCIAL HISTORY: Social History   Socioeconomic History  . Marital status: Widowed    Spouse name: Not on file  . Number of children: 2  . Years of education: 10th grade  . Highest education level: Not on file  Occupational History  . Occupation: Retired  Scientific laboratory technician  . Financial resource strain: Not on file  . Food insecurity:    Worry: Not on file    Inability: Not on file  . Transportation needs:    Medical: Not on file    Non-medical: Not on file  Tobacco Use  . Smoking status: Never Smoker  . Smokeless tobacco: Never Used  Substance and Sexual Activity  . Alcohol use: No  . Drug use: No  . Sexual activity: Not Currently    Comment: menarche age 62, first live birth age 27, P48,  contraception x 40 years, menopause 35  Lifestyle  . Physical activity:    Days per week: Not on file    Minutes per  session: Not on file  . Stress: Not on file  Relationships  . Social connections:    Talks on phone: Not on file    Gets together: Not on file    Attends religious service: Not on file    Active member of club or organization: Not on file    Attends meetings of clubs or organizations: Not on file    Relationship status: Not on file  . Intimate partner violence:    Fear of current or ex partner: Not on file    Emotionally abused: Not on file    Physically abused: Not on file    Forced sexual activity: Not on file  Other Topics Concern  . Not on file  Social History Narrative   Lives at home alone.   Right-handed.   1 cup caffeine per day.     PHYSICAL EXAM   Vitals:   07/21/18 1424  BP: 125/76  Pulse: (!) 59  Height: 5\' 2"  (1.575 m)    Not recorded      Body mass index is 44.63 kg/m.  PHYSICAL EXAMNIATION:  Gen: NAD, conversant, well nourised, obese, well groomed                     Cardiovascular: Regular rate rhythm, no peripheral edema, warm, nontender. Eyes: Conjunctivae clear without exudates or hemorrhage Neck: Supple, no carotid bruits. Pulmonary: Clear to auscultation bilaterally   NEUROLOGICAL EXAM:  MENTAL STATUS: Speech:    Speech is normal; fluent and spontaneous with normal comprehension.  Cognition:     Orientation to time, place and person     Normal recent and remote memory     Normal Attention span and concentration     Normal Language, naming, repeating,spontaneous speech     Fund of knowledge   CRANIAL NERVES: CN II: Visual fields are full to confrontation. Fundoscopic exam is normal with sharp discs and no vascular changes. Pupils are round equal and briskly reactive  to light. CN III, IV, VI: extraocular movement are normal. No ptosis. CN V: Facial sensation is intact to pinprick in all 3 divisions bilaterally. Corneal responses are intact.  CN VII: Face is symmetric with normal eye closure and smile. CN VIII: Hearing is normal to  rubbing fingers CN IX, X: Palate elevates symmetrically. Phonation is normal. CN XI: Head turning and shoulder shrug are intact CN XII: Tongue is midline with normal movements and no atrophy.  MOTOR: Anxious looking elderly female, bilateral hands action tremor, head titubation, jaw shaking movement.  REFLEXES: Reflexes are 2+ and symmetric at the biceps, triceps, knees, and ankles. Plantar responses are flexor.  SENSORY: Intact to light touch, pinprick, positional sensation and vibratory sensation are intact in fingers and toes.  COORDINATION: Rapid alternating movements and fine finger movements are intact. There is no dysmetria on finger-to-nose and heel-knee-shin.    GAIT/STANCE: She needs pushed up to get up from seated position, cautious  DIAGNOSTIC DATA (LABS, IMAGING, TESTING) - I reviewed patient records, labs, notes, testing and imaging myself where available.   ASSESSMENT AND PLAN  [name] is a 72 y.o. female   Bilateral hands tremor, head titubation  Most consistent with essential tremor Pseudoseizure  Neck pain, radiating pain and paresthesia to bilateral upper extremity  Proceed with MRI of brain, MRI of cervical spine,    Marcial Pacas, M.D. Ph.D.  [facility] [address], [place], [place] [address] Ph: [phone] Fax: [phone]  CC: Referring Provider

## 2018-07-25 ENCOUNTER — Encounter: Payer: Self-pay | Admitting: Internal Medicine

## 2018-07-27 DIAGNOSIS — R202 Paresthesia of skin: Secondary | ICD-10-CM | POA: Insufficient documentation

## 2018-07-28 ENCOUNTER — Encounter: Payer: Medicare Other | Admitting: Thoracic Surgery (Cardiothoracic Vascular Surgery)

## 2018-08-04 ENCOUNTER — Telehealth: Payer: Self-pay

## 2018-08-04 NOTE — Telephone Encounter (Signed)
Returned pt phone call. Pt would like to clarify why she is seeing Dr.Moody for radiation, instead of Dr.Gudena. Told pt that Dr.Moody will be in charge of pt radiation treatments, while Dr.Gudena will still be oversee her overall treatment for breast cancer. Pt also complaining of increased swelling and numbness around her lumpectomy surgical site. Asked if pt has a follow up appt with Dr.Byerly, and pt denies any at this time. Pt states that she was recently released from rehab around thanksgiving and had been dealing with increased pain, swelling, and numbness since then. Advised pt to call Dr.Byerly's office to see if she can be seen this week, since she will be getting ready to start radiation soon. Pt verbalized understanding.  Called Dr.Byerly's office to make them aware of pt symptoms and that she has an upcoming radiation appt with Dr.Moody on 12/12.  Was informed by their office that she already has an appt on 12/17th with Dr.Byerly. Requested earlier appt for this week if possible to evaluate pt symptoms. They will contact pt and try to reschedule for earlier date. Pt was notified and is aware.

## 2018-08-05 ENCOUNTER — Other Ambulatory Visit: Payer: Self-pay | Admitting: Family Medicine

## 2018-08-05 DIAGNOSIS — I712 Thoracic aortic aneurysm, without rupture, unspecified: Secondary | ICD-10-CM

## 2018-08-05 NOTE — Progress Notes (Signed)
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  Ultrasound: 3 suspicious lymph nodes largest measuring 2.5 cm.  Mammogram: right breast mass measuring 2.6 cm.  Patient currently complaining of increased swelling and numbness around her lumpectomy surgical site.  She is also having increased pain since being released from rehab around Thanksgiving.  Histology per Pathology Report: Right lumpectomy 07/02/2018    Right Breast Biopsy 05/22/2018  Receptor Status: ER(+100%), PR (+90%), Her2-neu (-), Ki-67(50%)  Past/Anticipated interventions by surgeon, if any: Dr. Barry Dienes 06/17/2018 -Will plan BCT for this lady given her overall health status.  Mass palpable but borders vague.  Will get seed placed.  Will have seed placed in positive lymph node as well.  Will also do SLN biopsy. - Will refer to oncology and radiation oncology.  I suspect XRT will be recommended no matter what surgery we do because of the positive node.  This cancer does appear to be more aggressive than the previous one.  I am not sure she will be candidate for mammaprint/chemo based on her overall health status, but will let onc make that decision.   -She will be recommended for antiestrogen treatment regardless. -Breast lumpectomy with radioactive seed with right seed targeted lymph node biopsy and sentinel lymph nodes 07/02/2018  -Follow-up appointment on 08/11/2018  Past/Anticipated interventions by medical oncology, if any: Chemotherapy  Dr. Lindi Adie 06/17/2018 -Breast conserving surgery with axillary lymph node dissection. -Because the patient is relatively frail with multiple medical conditions, I do not recommend mammaprint testing because she is not a  candidate for chemotherapy. -Referral to radiation oncology. -Followed by antiestrogen therapy.  Lymphedema issues, if any:   None in the arm.  Has had some in the breast.  Pain issues, if any:  In that right breast.  BP (!) 144/80 (BP Location: Left Arm, Patient Position: Sitting)   Pulse (!) 53   Temp 97.6 F (36.4 C) (Oral)   Resp 18   Ht 5' 4"  (1.626 m)   Wt 245 lb 2 oz (111.2 kg)   SpO2 97%   BMI 42.08 kg/m    Wt Readings from Last 3 Encounters:  08/06/18 245 lb 2 oz (111.2 kg)  07/20/18 244 lb (110.7 kg)  07/08/18 244 lb (110.7 kg)   SAFETY ISSUES:  Prior radiation? Yes  Pacemaker/ICD? No  Possible current pregnancy? No  Is the patient on methotrexate? No  Current Complaints / other details:   -History of left breast cancer treated with lumpectomy (09/29/2013) and radiation (10/25/2013-11/19/2013) with Dr. Valere Dross, refused antiestrogen therapy.

## 2018-08-06 ENCOUNTER — Ambulatory Visit
Admission: RE | Admit: 2018-08-06 | Discharge: 2018-08-06 | Disposition: A | Discharge status: Home / Self Care | Payer: Medicare Other | Source: Ambulatory Visit | Attending: Radiation Oncology | Admitting: Radiation Oncology

## 2018-08-06 ENCOUNTER — Other Ambulatory Visit: Payer: Self-pay

## 2018-08-06 ENCOUNTER — Encounter: Payer: Self-pay | Admitting: Radiation Oncology

## 2018-08-06 VITALS — BP 144/80 | HR 53 | Temp 97.6°F | Resp 18 | Ht 64.0 in | Wt 245.1 lb

## 2018-08-06 DIAGNOSIS — N61 Mastitis without abscess: Secondary | ICD-10-CM | POA: Insufficient documentation

## 2018-08-06 DIAGNOSIS — Z923 Personal history of irradiation: Secondary | ICD-10-CM | POA: Insufficient documentation

## 2018-08-06 DIAGNOSIS — Z170 Estrogen receptor positive status [ER+]: Secondary | ICD-10-CM | POA: Insufficient documentation

## 2018-08-06 DIAGNOSIS — C773 Secondary and unspecified malignant neoplasm of axilla and upper limb lymph nodes: Secondary | ICD-10-CM

## 2018-08-06 DIAGNOSIS — Z79890 Hormone replacement therapy (postmenopausal): Secondary | ICD-10-CM | POA: Diagnosis not present

## 2018-08-06 DIAGNOSIS — C50411 Malignant neoplasm of upper-outer quadrant of right female breast: Secondary | ICD-10-CM | POA: Diagnosis present

## 2018-08-06 DIAGNOSIS — C50412 Malignant neoplasm of upper-outer quadrant of left female breast: Secondary | ICD-10-CM

## 2018-08-06 DIAGNOSIS — C50911 Malignant neoplasm of unspecified site of right female breast: Secondary | ICD-10-CM

## 2018-08-06 MED ORDER — DOXYCYCLINE HYCLATE 100 MG PO TBEC: 100.0000 mg | DELAYED_RELEASE_TABLET | Freq: Two times a day (BID) | ORAL | 0 refills | Status: DC

## 2018-08-06 NOTE — Progress Notes (Signed)
Radiation Oncology         (336) [phone] ________________________________  Name: [name]        MRN: [id number]  Date of Service: 08/06/2018 DOB: [birth year]  CC:London Pepper, MD  [name], MD     REFERRING PHYSICIAN: [name], MD   DIAGNOSIS: The encounter diagnosis was Malignant neoplasm of upper-outer quadrant of right breast in female, estrogen receptor positive ([place]).   HISTORY OF PRESENT ILLNESS: [name] is a 72 y.o. female seen in the multidisciplinary breast clinic for a new diagnosis of right breast cancer. The patient was noted to have a breast cancer in the left breast treated with lumpectomy and adjuvant radiotherapy under the care of Dr. Valere Dross in 2015. She was recently noted to have a mass measuring 2.6 x 2 x 1.4 cm with one abnormal axillary node. She had a biopsy that revealed an invasive ductal carcinoma, ER/PR positive, HER2 negative. She underwent lumpectomy on 07/02/18 which revealed a grade 2 invasive ductal carcinoma measuring 2.5 cm, and her closest margin was <1 mm from the medial margin. She had 3 sampled nodes and 2 of them contained disease and had extracapsular extension. Her tumor was ER/PR positive, HER2 negative and Ki 67 was 50%. She is not a candidate for chemotherapy. She has had delays in her recovery from surgery due to infections and has been on several courses of antibiotics. She is due to see Dr. Barry Dienes for evaluation on Tuesday and has had three aspirations of seroma.     PREVIOUS RADIATION THERAPY: Yes   11/02/2013- 12/24/2013:                                                   Left breast 4680 cGy in 26 sessions, left breast boost 1400 cGy in 7 sessions                          PAST MEDICAL HISTORY:  Past Medical History:  Diagnosis Date  . Anemia   . Anxiety   . Arthritis   . Bladder incontinence   . Bowel incontinence   . Breast cancer ([place]) 08/16/13   left, 1 o'clock  . Chronic cystitis   . Chronic diarrhea   .  Complication of anesthesia    hard to wake up  . Depression   . Full dentures   . GERD (gastroesophageal reflux disease)   . High blood pressure   . History of radiation therapy 11/02/13- 12/24/13   left breast 4680 cGy in 26 sessions, left breast boost 1400 cGy in 7 sessions  . HOH (hard of hearing)   . Hx of colonic polyps 12/03/2016  . Hypothyroidism   . Personal history of radiation therapy   . Pneumonia    hx  . Pseudoseizures   . Shortness of breath    when walks  . Sleep apnea    has a cpap-5 hr/ not used in years  . Stroke ([place]) 1980   legs weak  . Tremor   . Urinary incontinence   . Vertigo        PAST SURGICAL HISTORY: Past Surgical History:  Procedure Laterality Date  . ABDOMINAL HYSTERECTOMY    . BACK SURGERY  2005   lumbar fusion  . BREAST LUMPECTOMY Left 2015  . BREAST  LUMPECTOMY Right 07/02/2018   BREAST LUMPECTOMY WITH RADIOACTIVE SEED WITH RIGHT SEED TARGETED LYMPH NODE BIOPSY AND SENTINEL LYMPH NODE BIOPSY (Right Breast)  . BREAST LUMPECTOMY WITH NEEDLE LOCALIZATION AND AXILLARY SENTINEL LYMPH NODE BX Left 09/29/2013   Procedure: BREAST LUMPECTOMY WITH NEEDLE LOCALIZATION AND AXILLARY SENTINEL LYMPH NODE BX;  Surgeon: [name], MD;  Location: [facility];  Service: General;  Laterality: Left;  clean wound class  . BREAST LUMPECTOMY WITH RADIOACTIVE SEED AND AXILLARY LYMPH NODE DISSECTION Right 07/02/2018   Procedure: BREAST LUMPECTOMY WITH RADIOACTIVE SEED WITH RIGHT SEED TARGETED LYMPH NODE BIOPSY AND SENTINEL LYMPH NODE BIOPSY;  Surgeon: [name], MD;  Location: [facility];  Service: General;  Laterality: Right;  . COLONOSCOPY    . MULTIPLE TOOTH EXTRACTIONS    . ORIF WRIST FRACTURE Left 07/08/2015   Procedure: OPEN REDUCTION INTERNAL FIXATION (ORIF) LEFT WRIST FRACTURE AND REPAIR AS INDICATED;  Surgeon: Iran Planas, MD;  Location: [facility];  Service: Orthopedics;  Laterality: Left;  . ovarian cysts    . TOTAL HIP ARTHROPLASTY  2005   rt  total hip     FAMILY HISTORY:  Family History  Problem Relation Age of Onset  . Lung cancer Brother   . Diabetes Brother   . Stroke Mother   . Ataxia Neg Hx   . Chorea Neg Hx   . Dementia Neg Hx   . Mental retardation Neg Hx   . Migraines Neg Hx   . Multiple sclerosis Neg Hx   . Neurofibromatosis Neg Hx   . Neuropathy Neg Hx   . Parkinsonism Neg Hx   . Seizures Neg Hx      SOCIAL HISTORY:  reports that she has never smoked. She has never used smokeless tobacco. She reports that she does not drink alcohol or use drugs. The patient is widowed. She lives in [place].    ALLERGIES: Codeine   MEDICATIONS:  Current Outpatient Medications  Medication Sig Dispense Refill  . acetaminophen (TYLENOL) 325 MG tablet Take 2 tablets (650 mg total) by mouth every 6 (six) hours as needed for mild pain, fever or headache (or Fever >/= 101). 50 tablet 0  . albuterol (PROVENTIL HFA;VENTOLIN HFA) 108 (90 Base) MCG/ACT inhaler Inhale 2 puffs into the lungs every 6 (six) hours as needed for wheezing or shortness of breath. 1 Inhaler 2  . amLODipine (NORVASC) 10 MG tablet Take 1 tablet (10 mg total) by mouth daily. 30 tablet 0  . atenolol (TENORMIN) 25 MG tablet Take 1 tablet (25 mg total) by mouth at bedtime. 30 tablet 0  . clonazePAM (KLONOPIN) 1 MG tablet Take 1 tablet (1 mg total) by mouth 3 (three) times daily. 21 tablet 0  . doxycycline (DORYX) 100 MG EC tablet Take 1 tablet (100 mg total) by mouth 2 (two) times daily. 1 tab po bid x 7 days right breast surgical incision wound ; stop date 11/29 3 tablet 0  . escitalopram (LEXAPRO) 20 MG tablet Take 1 tablet (20 mg total) by mouth at bedtime. 30 tablet 0  . fluticasone (FLONASE) 50 MCG/ACT nasal spray Place 1 spray into both nostrils daily as needed for allergies. 1 g 0  . guaiFENesin-dextromethorphan (ROBITUSSIN DM) 100-10 MG/5ML syrup Take 5 mLs by mouth every 4 (four) hours as needed for cough. 118 mL 0  . levothyroxine (SYNTHROID,  LEVOTHROID) 50 MCG tablet Take 1 tablet (50 mcg total) by mouth daily before breakfast. 30 tablet 0  . Loperamide HCl (IMODIUM PO)  Take 2 mg by mouth daily as needed (diarrhea).     . montelukast (SINGULAIR) 10 MG tablet Take 1 tablet (10 mg total) by mouth at bedtime. 30 tablet 0  . naproxen sodium (ANAPROX) 220 MG tablet Take 440 mg by mouth 2 (two) times daily as needed (pain).      No current facility-administered medications for this encounter.      REVIEW OF SYSTEMS: On review of systems, the patient reports that she is doing well overall but has had some fullness and soreness in her right breast. She denies any swelling of her arm. She denies any chest pain, shortness of breath, cough, fevers, chills, night sweats, unintended weight changes. She denies any bowel or bladder disturbances, and denies abdominal pain, nausea or vomiting. She denies any new musculoskeletal or joint aches or pains. A complete review of systems is obtained and is otherwise negative.     PHYSICAL EXAM:  Wt Readings from Last 3 Encounters:  08/06/18 245 lb 2 oz (111.2 kg)  07/20/18 244 lb (110.7 kg)  07/08/18 244 lb (110.7 kg)   Temp Readings from Last 3 Encounters:  08/06/18 97.6 F (36.4 C) (Oral)  07/20/18 97.6 F (36.4 C)  07/12/18 (!) 97 F (36.1 C)   BP Readings from Last 3 Encounters:  08/06/18 (!) 144/80  07/21/18 125/76  07/20/18 (!) 148/80   Pulse Readings from Last 3 Encounters:  08/06/18 (!) 53  07/21/18 (!) 59  07/20/18 (!) 52     In general this is a chronically ill appearing caucasian female in no acute distress. She is alert and oriented x4 and appropriate throughout the examination. HEENT reveals that the patient is normocephalic, atraumatic. EOMs are intact. Cardiopulmonary assessment is negative for acute distress and she exhibits normal effort. Her right breast is indurated and erythematous, and warm to the touch. Her incision site from surgery is otherwise well  healed.      ECOG = 1  0 - Asymptomatic (Fully active, able to carry on all predisease activities without restriction)  1 - Symptomatic but completely ambulatory (Restricted in physically strenuous activity but ambulatory and able to carry out work of a light or sedentary nature. For example, light housework, office work)  2 - Symptomatic, <50% in bed during the day (Ambulatory and capable of all self care but unable to carry out any work activities. Up and about more than 50% of waking hours)  3 - Symptomatic, >50% in bed, but not bedbound (Capable of only limited self-care, confined to bed or chair 50% or more of waking hours)  4 - Bedbound (Completely disabled. Cannot carry on any self-care. Totally confined to bed or chair)  5 - Death   Eustace Pen MM, Creech RH, Tormey DC, et al. [phone]). "Toxicity and response criteria of the [facility]". [place] Oncol. 5 (6): 649-55    LABORATORY DATA:  Lab Results  Component Value Date   WBC 5.9 07/14/2018   HGB 13.9 07/14/2018   HCT 41 07/14/2018   MCV 102.9 (H) 07/04/2018   PLT 134 (A) 07/14/2018   Lab Results  Component Value Date   NA 142 07/14/2018   K 4.7 07/14/2018   CL 104 07/04/2018   CO2 30 07/04/2018   Lab Results  Component Value Date   ALT 17 11/28/2017   AST 24 11/28/2017   ALKPHOS 42 11/28/2017   BILITOT 0.9 11/28/2017      RADIOGRAPHY: No results found.  IMPRESSION/PLAN: 1. Stage IB, pT2N1M0 grade 2, ER/PR positive invasive ductal carcinoma of the right breast. Dr. Lisbeth Renshaw discusses the pathology findings and reviews the nature of right breast disease. She is not a candidate for systemic chemotherapy but she would be benefited by adjuvant radiotherapy and antiestrogen. We discussed the risks, benefits, short, and long term effects of radiotherapy, and the patient is interested in proceeding. Dr. Lisbeth Renshaw discusses the delivery and logistics of radiotherapy and anticipates a course of  6  1/2 weeks of radiotherapy to the breast and regional nodes. Written consent is obtained and placed in the chart, a copy was provided to the patient. We will try to coordinate simulation next week provided her cellulitis has improved. 2. History of Stage IA, pT1bN0 ER/PR positive invasive ductal carcinoma of the left breast. She will have continued surveillance mammography and follow with Dr. Lindi Adie regarding this as well.  3. Right breast cellulitis. I've been in touch with Dr. Barry Dienes and we have prescribed Doxycycline 100 mg BID for 7 days. She will see the patient next Tuesday and let us know if we can proceed with treatment.  In a visit lasting 60 minutes, greater than 50% of the time was spent face to face discussing her case, and coordinating the patient's care.  The above documentation reflects my direct findings during this shared patient visit. Please see the separate note by Dr. Lisbeth Renshaw on this date for the remainder of the patient's plan of care.    Carola Rhine, PAC

## 2018-08-07 ENCOUNTER — Other Ambulatory Visit: Payer: Medicare Other

## 2018-08-07 ENCOUNTER — Ambulatory Visit
Admission: RE | Admit: 2018-08-07 | Discharge: 2018-08-07 | Disposition: A | Discharge status: Home / Self Care | Payer: Medicare Other | Source: Ambulatory Visit | Attending: Family Medicine | Admitting: Family Medicine

## 2018-08-07 DIAGNOSIS — I712 Thoracic aortic aneurysm, without rupture, unspecified: Secondary | ICD-10-CM

## 2018-08-07 MED ORDER — IOPAMIDOL (ISOVUE-370) INJECTION 76%
50.0000 mL | Freq: Once | INTRAVENOUS | Status: AC | PRN
  Administered 2018-08-07: 50 mL via INTRAVENOUS

## 2018-08-10 ENCOUNTER — Inpatient Hospital Stay: Payer: Medicare Other | Attending: Hematology and Oncology | Admitting: Hematology and Oncology

## 2018-08-10 DIAGNOSIS — T8149XD Infection following a procedure, other surgical site, subsequent encounter: Secondary | ICD-10-CM

## 2018-08-10 DIAGNOSIS — Z853 Personal history of malignant neoplasm of breast: Secondary | ICD-10-CM | POA: Diagnosis not present

## 2018-08-10 DIAGNOSIS — C773 Secondary and unspecified malignant neoplasm of axilla and upper limb lymph nodes: Secondary | ICD-10-CM

## 2018-08-10 DIAGNOSIS — Z170 Estrogen receptor positive status [ER+]: Secondary | ICD-10-CM | POA: Diagnosis not present

## 2018-08-10 DIAGNOSIS — C50411 Malignant neoplasm of upper-outer quadrant of right female breast: Secondary | ICD-10-CM | POA: Insufficient documentation

## 2018-08-10 MED ORDER — LETROZOLE 2.5 MG PO TABS: 2.5000 mg | ORAL_TABLET | Freq: Every day | ORAL | 3 refills | Status: DC

## 2018-08-10 NOTE — Progress Notes (Signed)
Patient Care Team: London Pepper, MD as PCP - General (Family Medicine) Alvester Chou, NP (Nurse Practitioner) Kyung Rudd, MD as Consulting Physician (Radiation Oncology) Nicholas Lose, MD as Consulting Physician (Hematology and Oncology) [name], MD as Consulting Physician (General Surgery) Delice Bison Charlestine Massed, NP as Nurse Practitioner (Hematology and Oncology)  DIAGNOSIS:  Encounter Diagnosis  Name Primary?  . Malignant neoplasm of upper-outer quadrant of right breast in female, estrogen receptor positive ([place])     SUMMARY OF ONCOLOGIC HISTORY:   Breast cancer of upper-outer quadrant of left female breast ([place])   08/16/2013 Initial Diagnosis    left breast invasive ductal carcinomawith DCIS, grade 1, ER/PR positive HER-2 negative, MRI 08/31/2013 7 mm enhancement, right breast biopsy benign    09/29/2013 Surgery    left breast lumpectomy: Invasive ductal carcinoma 0.9 cm margins negative with DCIS and ALH, 2 sentinel nodes negative, T1 BN 0M0 stage Ia    10/25/2013 - 11/19/2013 Radiation Therapy    Adjuvant radiation therapy     Anti-estrogen oral therapy    Refused antiestrogen therapy     Malignant neoplasm of upper-outer quadrant of right breast in female, estrogen receptor positive ([place])   06/01/2018 Initial Diagnosis    Prior history of left breast cancer. right breast irregular mass 2 x 1.4 x 2.6 cm ultrasound revealed 3 suspicious lymph nodes largest measuring 2.5 cm.  Biopsy 9:30 position: Grade 3 IDC with DCIS ER 100%, PR 90%, Ki-67 50%, HER-2 -1+ by IHC lymph node biopsy: Positive, T2N1    06/17/2018 Cancer Staging    Staging form: Breast, AJCC 8th Edition - Clinical: Stage IIB (cT2, cN1, cM0, G3, ER+, PR+, HER2-) - Signed by Nicholas Lose, MD on 06/17/2018    06/17/2018 Cancer Staging    Staging form: Breast, AJCC 8th Edition - Pathologic: Stage IIA (pT2, pN1a, cM0, G3, ER+, PR+, HER2-) - Signed by Nicholas Lose, MD on 07/13/2018    07/02/2018 Surgery   Right lumpectomy: Grade 3 IDC with DCIS 2.5 cm, margins negative, 2/3 lymph nodes positive, ER 100%, PR 90%, HER-2 negative, Ki-67 50%, T2N1A     CHIEF COMPLIANT: Follow-up to discuss treatment plan  INTERVAL HISTORY: [name] is a 21-year with above-mentioned history of right breast cancer treated with lumpectomy who has had X tensive recovery from the surgery with multiple infections requiring multiple courses of antibiotics.  She is still not fully healed up.  She is here today to discuss adjuvant treatment plan.  REVIEW OF SYSTEMS:   Constitutional: Denies fevers, chills or abnormal weight loss Eyes: Denies blurriness of vision Ears, nose, mouth, throat, and face: Denies mucositis or sore throat Respiratory: Denies cough, dyspnea or wheezes Cardiovascular: Denies palpitation, chest discomfort Gastrointestinal:  Denies nausea, heartburn or change in bowel habits Skin: Denies abnormal skin rashes Lymphatics: Denies new lymphadenopathy or easy bruising Neurological:Denies numbness, tingling or new weaknesses Behavioral/Psych: Mood is stable, no new changes  Extremities: No lower extremity edema   All other systems were reviewed with the patient and are negative.  I have reviewed the past medical history, past surgical history, social history and family history with the patient and they are unchanged from previous note.  ALLERGIES:  is allergic to codeine.  MEDICATIONS:  Current Outpatient Medications  Medication Sig Dispense Refill  . acetaminophen (TYLENOL) 325 MG tablet Take 2 tablets (650 mg total) by mouth every 6 (six) hours as needed for mild pain, fever or headache (or Fever >/= 101). 50 tablet 0  . albuterol (  PROVENTIL HFA;VENTOLIN HFA) 108 (90 Base) MCG/ACT inhaler Inhale 2 puffs into the lungs every 6 (six) hours as needed for wheezing or shortness of breath. 1 Inhaler 2  . amLODipine (NORVASC) 10 MG tablet Take 1 tablet (10 mg total) by mouth daily. 30 tablet 0  .  atenolol (TENORMIN) 25 MG tablet Take 1 tablet (25 mg total) by mouth at bedtime. 30 tablet 0  . clonazePAM (KLONOPIN) 1 MG tablet Take 1 tablet (1 mg total) by mouth 3 (three) times daily. 21 tablet 0  . doxycycline (DORYX) 100 MG EC tablet Take 1 tablet (100 mg total) by mouth 2 (two) times daily. 1 tab po bid x 7 days right breast surgical incision wound ; stop date 11/29 14 tablet 0  . escitalopram (LEXAPRO) 20 MG tablet Take 1 tablet (20 mg total) by mouth at bedtime. 30 tablet 0  . fluticasone (FLONASE) 50 MCG/ACT nasal spray Place 1 spray into both nostrils daily as needed for allergies. 1 g 0  . guaiFENesin-dextromethorphan (ROBITUSSIN DM) 100-10 MG/5ML syrup Take 5 mLs by mouth every 4 (four) hours as needed for cough. 118 mL 0  . letrozole (FEMARA) 2.5 MG tablet Take 1 tablet (2.5 mg total) by mouth daily. 90 tablet 3  . levothyroxine (SYNTHROID, LEVOTHROID) 50 MCG tablet Take 1 tablet (50 mcg total) by mouth daily before breakfast. 30 tablet 0  . Loperamide HCl (IMODIUM PO) Take 2 mg by mouth daily as needed (diarrhea).     . montelukast (SINGULAIR) 10 MG tablet Take 1 tablet (10 mg total) by mouth at bedtime. 30 tablet 0  . naproxen sodium (ANAPROX) 220 MG tablet Take 440 mg by mouth 2 (two) times daily as needed (pain).      No current facility-administered medications for this visit.     PHYSICAL EXAMINATION: ECOG PERFORMANCE STATUS: 1 - Symptomatic but completely ambulatory  Vitals:   08/10/18 1410  BP: 136/81  Pulse: 62  Resp: 18  Temp: 97.6 F (36.4 C)  SpO2: 95%   Filed Weights   08/10/18 1410  Weight: 246 lb (111.6 kg)    GENERAL:alert, no distress and comfortable SKIN: skin color, texture, turgor are normal, no rashes or significant lesions EYES: normal, Conjunctiva are pink and non-injected, sclera clear OROPHARYNX:no exudate, no erythema and lips, buccal mucosa, and tongue normal  NECK: supple, thyroid normal size, non-tender, without nodularity LYMPH:  no  palpable lymphadenopathy in the cervical, axillary or inguinal LUNGS: clear to auscultation and percussion with normal breathing effort HEART: regular rate & rhythm and no murmurs and no lower extremity edema ABDOMEN:abdomen soft, non-tender and normal bowel sounds MUSCULOSKELETAL:no cyanosis of digits and no clubbing  NEURO: alert & oriented x 3 with fluent speech, no focal motor/sensory deficits EXTREMITIES: No lower extremity edema    LABORATORY DATA:  I have reviewed the data as listed CMP Latest Ref Rng & Units 07/14/2018 07/04/2018 07/03/2018  Glucose 70 - 99 mg/dL - 107(H) 127(H)  BUN 4 - 21 22(A) 36(H) 30(H)  Creatinine 0.5 - 1.1 0.8 1.52(H) 1.47(H)  Sodium 137 - 147 142 139 139  Potassium 3.4 - 5.3 4.7 4.5 4.7  Chloride 98 - 111 mmol/L - 104 105  CO2 22 - 32 mmol/L - 30 25  Calcium 8.9 - 10.3 mg/dL - 8.2(L) 8.3(L)  Total Protein 6.5 - 8.1 g/dL - - -  Total Bilirubin 0.3 - 1.2 mg/dL - - -  Alkaline Phos 38 - 126 U/L - - -  AST 15 -  41 U/L - - -  ALT 14 - 54 U/L - - -    Lab Results  Component Value Date   WBC 5.9 07/14/2018   HGB 13.9 07/14/2018   HCT 41 07/14/2018   MCV 102.9 (H) 07/04/2018   PLT 134 (A) 07/14/2018   NEUTROABS 5.2 11/28/2017    ASSESSMENT & PLAN:  Malignant neoplasm of upper-outer quadrant of right breast in female, estrogen receptor positive ([place]) 06/01/2018:Prior history of left breast cancer. right breast irregular mass 2 x 1.4 x 2.6 cm ultrasound revealed 3 suspicious lymph nodes largest measuring 2.5 cm.  Biopsy 9:30 position: Grade 3 IDC with DCIS ER 100%, PR 90%, Ki-67 50%, HER-2 -1+ by IHC lymph node biopsy: Positive, T2N1 stage IIb  (07/2013: left breast lumpectomy: Invasive ductal carcinoma 0.9 cm margins negative with DCIS and ALH, 2 sentinel nodes negative, T1 BN 0M0 stage Ia: Status post lumpectomy and radiation, refused antiestrogen therapy)  07/02/2018: Right lumpectomy: Grade 3 IDC with DCIS 2.5 cm, margins negative, 2/3 lymph nodes  positive, ER 100%, PR 90%, HER-2 negative, Ki-67 50%, T2N1A  Patient has extremely poor wound healing and has not been able to resume breast cancer treatment with radiation.  Because of this delay, I recommended that we start letrozole 2.5 mg daily starting today. She will stop antiestrogen therapy when radiation therapy begins.  I would like to see the patient at the end of radiation therapy.     No orders of the defined types were placed in this encounter.  The patient has a good understanding of the overall plan. she agrees with it. she will call with any problems that may develop before the next visit here.   Harriette Ohara, MD 08/10/18

## 2018-08-10 NOTE — Assessment & Plan Note (Signed)
06/01/2018:Prior history of left breast cancer. right breast irregular mass 2 x 1.4 x 2.6 cm ultrasound revealed 3 suspicious lymph nodes largest measuring 2.5 cm.  Biopsy 9:30 position: Grade 3 IDC with DCIS ER 100%, PR 90%, Ki-67 50%, HER-2 -1+ by IHC lymph node biopsy: Positive, T2N1 stage IIb  (07/2013: left breast lumpectomy: Invasive ductal carcinoma 0.9 cm margins negative with DCIS and ALH, 2 sentinel nodes negative, T1 BN 0M0 stage Ia: Status post lumpectomy and radiation, refused antiestrogen therapy)  07/02/2018: Right lumpectomy: Grade 3 IDC with DCIS 2.5 cm, margins negative, 2/3 lymph nodes positive, ER 100%, PR 90%, HER-2 negative, Ki-67 50%, T2N1A  Patient has extremely poor wound healing and has not been able to resume breast cancer treatment with radiation.  Because of this delay, I recommended that we start letrozole 2.5 mg daily starting today. She will stop antiestrogen therapy when radiation therapy begins.  I would like to see the patient at the end of radiation therapy.

## 2018-08-11 ENCOUNTER — Telehealth: Payer: Self-pay | Admitting: Hematology and Oncology

## 2018-08-11 ENCOUNTER — Telehealth: Payer: Self-pay | Admitting: Surgery

## 2018-08-11 ENCOUNTER — Encounter: Payer: Medicare Other | Admitting: Thoracic Surgery (Cardiothoracic Vascular Surgery)

## 2018-08-11 NOTE — Telephone Encounter (Signed)
No 12/16 los or referrals.

## 2018-08-11 NOTE — Telephone Encounter (Signed)
She had a right lumpectomy on 07/02/2018 by Dr. Barry Dienes.  She was seen in the office and had a seroma aspirated. She said that this was her 4th aspiration. She is having some continued drainage.  She is going to keep the area clean, place gauze over the area,  and contact our office tomorrow.  Alphonsa Overall, MD, [facility] Surgery Pager: [phone] Office phone:  [phone]

## 2018-08-17 ENCOUNTER — Other Ambulatory Visit: Payer: Self-pay | Admitting: Internal Medicine

## 2018-09-01 ENCOUNTER — Encounter: Payer: Medicare Other | Admitting: Thoracic Surgery (Cardiothoracic Vascular Surgery)

## 2018-09-10 ENCOUNTER — Telehealth: Payer: Self-pay | Admitting: *Deleted

## 2018-09-10 NOTE — Telephone Encounter (Signed)
Returned patient's phone call, lvm for a return call 

## 2018-09-15 ENCOUNTER — Encounter: Payer: Medicare Other | Admitting: Thoracic Surgery (Cardiothoracic Vascular Surgery)

## 2018-09-16 ENCOUNTER — Encounter: Payer: Self-pay | Admitting: Thoracic Surgery (Cardiothoracic Vascular Surgery)

## 2018-09-23 ENCOUNTER — Ambulatory Visit: Payer: Medicare Other | Admitting: Radiation Oncology

## 2018-09-24 ENCOUNTER — Ambulatory Visit: Payer: Medicare Other | Admitting: Neurology

## 2018-10-02 ENCOUNTER — Ambulatory Visit
Admission: RE | Admit: 2018-10-02 | Discharge: 2018-10-02 | Disposition: A | Discharge status: Home / Self Care | Payer: Medicare Other | Source: Ambulatory Visit | Attending: Radiation Oncology | Admitting: Radiation Oncology

## 2018-10-02 DIAGNOSIS — C50411 Malignant neoplasm of upper-outer quadrant of right female breast: Secondary | ICD-10-CM | POA: Insufficient documentation

## 2018-10-02 DIAGNOSIS — Z170 Estrogen receptor positive status [ER+]: Secondary | ICD-10-CM | POA: Insufficient documentation

## 2018-10-02 DIAGNOSIS — Z510 Encounter for antineoplastic radiation therapy: Secondary | ICD-10-CM | POA: Diagnosis present

## 2018-10-08 NOTE — Progress Notes (Signed)
  Radiation Oncology         [phone]) [phone] ________________________________  Name: [name] MRN: [id number]  Date: 10/02/2018  DOB: Nov 11, 1945  Optical Surface Tracking Plan:  Since intensity modulated radiotherapy (IMRT) and 3D conformal radiation treatment methods are predicated on accurate and precise positioning for treatment, intrafraction motion monitoring is medically necessary to ensure accurate and safe treatment delivery.  The ability to quantify intrafraction motion without excessive ionizing radiation dose can only be performed with optical surface tracking. Accordingly, surface imaging offers the opportunity to obtain 3D measurements of patient position throughout IMRT and 3D treatments without excessive radiation exposure.  I am ordering optical surface tracking for this patient's upcoming course of radiotherapy. ________________________________  Kyung Rudd, MD 10/08/2018 1:43 PM    Reference:   Ursula Alert, J, et al. Surface imaging-based analysis of intrafraction motion for breast radiotherapy patients.Journal of [place], n. 6, nov. 2014. ISSN [id number].   Available at: <http://www.jacmp.org/index.php/jacmp/article/view/4957>.

## 2018-10-08 NOTE — Progress Notes (Signed)
  Radiation Oncology         [phone] ________________________________  Name: [name] MRN: [id number]  Date: 10/02/2018  DOB: 10-07-1945  DIAGNOSIS:     ICD-10-CM   1. Malignant neoplasm of upper-outer quadrant of right breast in female, estrogen receptor positive ([place]) C50.411    Z17.0      SIMULATION AND TREATMENT PLANNING NOTE  The patient presented for simulation prior to beginning her course of radiation treatment for her diagnosis of right-sided breast cancer. The patient was placed in a supine position on a breast board. A customized vac-lock bag was also constructed and this complex treatment device will be used on a daily basis during her treatment. In this fashion, a CT scan was obtained through the chest area and an isocenter was placed near the chest wall at the upper aspect of the right chest.  The patient will be planned to receive a course of radiation initially to a dose of 50.4 gray. This will consist of a 4 field technique targeting the right chest wall as well as the supraclavicular region. Therefore 2 customized medial and lateral tangent fields have been created targeting the right breast, and also 2 additional customized fields have been designed to treat the supraclavicular region both with a right supraclavicular field and a right posterior axillary boost field. A forward planning/reduced field technique will also be evaluated to determine if this significantly improves the dose homogeneity of the overall plan. Therefore, additional customized blocks/fields may be necessary.  This initial treatment will be accomplished at 1.8 gray per fraction.   The initial plan will consist of a 3-D conformal technique. The target volume/scar, heart and lungs have been contoured and dose volume histograms of each of these structures will be evaluated as part of the 3-D conformal treatment planning process.   It is anticipated that the patient will then receive a 10 gray boost to  the surgical scar. This will be accomplished at 2 gray per fraction. The final anticipated total dose therefore will correspond to 60.4 gray.    _______________________________   Jodelle Gross, MD, PhD

## 2018-10-09 ENCOUNTER — Ambulatory Visit: Payer: Medicare Other | Admitting: Radiation Oncology

## 2018-10-09 DIAGNOSIS — Z510 Encounter for antineoplastic radiation therapy: Secondary | ICD-10-CM | POA: Diagnosis not present

## 2018-10-11 ENCOUNTER — Other Ambulatory Visit: Payer: Self-pay | Admitting: Internal Medicine

## 2018-10-12 ENCOUNTER — Ambulatory Visit
Admission: RE | Admit: 2018-10-12 | Discharge: 2018-10-12 | Disposition: A | Discharge status: Home / Self Care | Payer: Medicare Other | Source: Ambulatory Visit | Attending: Radiation Oncology | Admitting: Radiation Oncology

## 2018-10-12 DIAGNOSIS — Z510 Encounter for antineoplastic radiation therapy: Secondary | ICD-10-CM | POA: Diagnosis not present

## 2018-10-13 ENCOUNTER — Ambulatory Visit
Admission: RE | Admit: 2018-10-13 | Discharge: 2018-10-13 | Disposition: A | Discharge status: Home / Self Care | Payer: Medicare Other | Source: Ambulatory Visit | Attending: Radiation Oncology | Admitting: Radiation Oncology

## 2018-10-13 DIAGNOSIS — Z510 Encounter for antineoplastic radiation therapy: Secondary | ICD-10-CM | POA: Diagnosis not present

## 2018-10-14 ENCOUNTER — Emergency Department (HOSPITAL_COMMUNITY): Payer: Medicare Other

## 2018-10-14 ENCOUNTER — Other Ambulatory Visit: Payer: Self-pay | Admitting: Radiation Oncology

## 2018-10-14 ENCOUNTER — Encounter: Payer: Self-pay | Admitting: Radiation Oncology

## 2018-10-14 ENCOUNTER — Emergency Department (HOSPITAL_COMMUNITY)
Admission: EM | Admit: 2018-10-14 | Discharge: 2018-10-14 | Disposition: A | Discharge status: Home / Self Care | Payer: Medicare Other | Attending: Emergency Medicine | Admitting: Emergency Medicine

## 2018-10-14 ENCOUNTER — Encounter (HOSPITAL_COMMUNITY): Payer: Self-pay

## 2018-10-14 ENCOUNTER — Other Ambulatory Visit: Payer: Self-pay

## 2018-10-14 ENCOUNTER — Telehealth: Payer: Self-pay | Admitting: Radiation Oncology

## 2018-10-14 ENCOUNTER — Telehealth: Payer: Self-pay | Admitting: Neurology

## 2018-10-14 ENCOUNTER — Ambulatory Visit
Admission: RE | Admit: 2018-10-14 | Discharge: 2018-10-14 | Disposition: A | Discharge status: Home / Self Care | Payer: Medicare Other | Source: Ambulatory Visit | Attending: Radiation Oncology | Admitting: Radiation Oncology

## 2018-10-14 DIAGNOSIS — Z170 Estrogen receptor positive status [ER+]: Principal | ICD-10-CM

## 2018-10-14 DIAGNOSIS — R569 Unspecified convulsions: Secondary | ICD-10-CM | POA: Insufficient documentation

## 2018-10-14 DIAGNOSIS — C50412 Malignant neoplasm of upper-outer quadrant of left female breast: Secondary | ICD-10-CM | POA: Insufficient documentation

## 2018-10-14 DIAGNOSIS — C50411 Malignant neoplasm of upper-outer quadrant of right female breast: Secondary | ICD-10-CM

## 2018-10-14 DIAGNOSIS — E039 Hypothyroidism, unspecified: Secondary | ICD-10-CM | POA: Diagnosis not present

## 2018-10-14 DIAGNOSIS — Z96641 Presence of right artificial hip joint: Secondary | ICD-10-CM | POA: Insufficient documentation

## 2018-10-14 DIAGNOSIS — I10 Essential (primary) hypertension: Secondary | ICD-10-CM | POA: Diagnosis not present

## 2018-10-14 DIAGNOSIS — Z79899 Other long term (current) drug therapy: Secondary | ICD-10-CM | POA: Insufficient documentation

## 2018-10-14 LAB — CBC WITH DIFFERENTIAL/PLATELET
Abs Immature Granulocytes: 0.01 10*3/uL (ref 0.00–0.07)
Basophils Absolute: 0 10*3/uL (ref 0.0–0.1)
Basophils Relative: 0 %
Eosinophils Absolute: 0.1 10*3/uL (ref 0.0–0.5)
Eosinophils Relative: 3 %
HCT: 49.5 % — ABNORMAL HIGH (ref 36.0–46.0)
Hemoglobin: 15 g/dL (ref 12.0–15.0)
Immature Granulocytes: 0 %
Lymphocytes Relative: 27 %
Lymphs Abs: 1.4 10*3/uL (ref 0.7–4.0)
MCH: 30.5 pg (ref 26.0–34.0)
MCHC: 30.3 g/dL (ref 30.0–36.0)
MCV: 100.6 fL — ABNORMAL HIGH (ref 80.0–100.0)
Monocytes Absolute: 0.4 10*3/uL (ref 0.1–1.0)
Monocytes Relative: 7 %
Neutro Abs: 3.3 10*3/uL (ref 1.7–7.7)
Neutrophils Relative %: 63 %
Platelets: 126 10*3/uL — ABNORMAL LOW (ref 150–400)
RBC: 4.92 MIL/uL (ref 3.87–5.11)
RDW: 14.1 % (ref 11.5–15.5)
WBC: 5.2 10*3/uL (ref 4.0–10.5)
nRBC: 0 % (ref 0.0–0.2)

## 2018-10-14 LAB — COMPREHENSIVE METABOLIC PANEL
ALT: 21 U/L (ref 0–44)
AST: 27 U/L (ref 15–41)
Albumin: 3.8 g/dL (ref 3.5–5.0)
Alkaline Phosphatase: 41 U/L (ref 38–126)
Anion gap: 6 (ref 5–15)
BUN: 18 mg/dL (ref 8–23)
CHLORIDE: 103 mmol/L (ref 98–111)
CO2: 31 mmol/L (ref 22–32)
Calcium: 8.8 mg/dL — ABNORMAL LOW (ref 8.9–10.3)
Creatinine, Ser: 0.94 mg/dL (ref 0.44–1.00)
GFR calc Af Amer: >60 mL/min (ref >60)
Glucose, Bld: 84 mg/dL (ref 70–99)
Potassium: 4 mmol/L (ref 3.5–5.1)
Sodium: 140 mmol/L (ref 135–145)
Total Bilirubin: 1.1 mg/dL (ref 0.3–1.2)
Total Protein: 6.8 g/dL (ref 6.5–8.1)

## 2018-10-14 LAB — CBG MONITORING, ED: Glucose-Capillary: 87 mg/dL (ref 70–99)

## 2018-10-14 MED ORDER — LEVETIRACETAM 500 MG PO TABS
500.0000 mg | ORAL_TABLET | Freq: Once | ORAL | Status: AC
  Administered 2018-10-14: 500 mg via ORAL
  Filled 2018-10-14: qty 1

## 2018-10-14 MED ORDER — SODIUM CHLORIDE 0.9 % IV SOLN
INTRAVENOUS | Status: DC
  Administered 2018-10-14: 16:00:00 via INTRAVENOUS

## 2018-10-14 MED ORDER — LORAZEPAM 2 MG/ML IJ SOLN
1.0000 mg | Freq: Once | INTRAMUSCULAR | Status: AC
  Administered 2018-10-14: 1 mg via INTRAVENOUS
  Filled 2018-10-14: qty 1

## 2018-10-14 MED ORDER — LEVETIRACETAM 500 MG PO TABS: 500.0000 mg | ORAL_TABLET | Freq: Two times a day (BID) | ORAL | 0 refills | Status: DC

## 2018-10-14 NOTE — ED Triage Notes (Signed)
[place]. PT CURRENTLY IN IR RECEIVING CANCER TX. REPORTS WITNESSED PT HAVING  "FULL BODY" SEIZURES (TWO) LASTING A FEW MINUTES. PT ABLE TO RECALL SEIZURES, ALERT AND ACTIVE WITH CARE. HX OF BREAST CA. ? WITH MET THEREFOR REQUESTING ADDITIONAL EVALUATION IN [facility] EMERGENCY DEPT.  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

## 2018-10-14 NOTE — ED Provider Notes (Signed)
[facility] DEPT Provider Note   CSN: [id number] Arrival date & time: 10/14/18  1458    History   Chief Complaint Seizure like activity  HPI [name] is a 73 y.o. female.     HPI Pt presented to the ED for evaluation of possible of seizure activity.  Pt had another episode while in the ED.  Pt was able to speak and communicate during the episode while she was here in the ED.  Pt has history of breast CA.  She also has a history of pseudoseizures (per the patient).  She recalls having shaking episodes in the cancer center.  She has full memory.  Pt admits feeling stressed.  Pt is having a headache.  She denies fevers.  No vomiting or diarrhea.  No CP or shortness of breath.    These episodes were witnessed in the cancer center.  Dr Krista Blue, her neurologist was contacted and the patient was sent to the ED for further evaluation. Past Medical History:  Diagnosis Date  . Anemia   . Anxiety   . Arthritis   . Bladder incontinence   . Bowel incontinence   . Breast cancer ([place]) 08/16/13   left, 1 o'clock  . Chronic cystitis   . Chronic diarrhea   . Complication of anesthesia    hard to wake up  . Depression   . Full dentures   . GERD (gastroesophageal reflux disease)   . High blood pressure   . History of radiation therapy 11/02/13- 12/24/13   left breast 4680 cGy in 26 sessions, left breast boost 1400 cGy in 7 sessions  . HOH (hard of hearing)   . Hx of colonic polyps 12/03/2016  . Hypothyroidism   . Personal history of radiation therapy   . Pneumonia    hx  . Pseudoseizures   . Shortness of breath    when walks  . Sleep apnea    has a cpap-5 hr/ not used in years  . Stroke ([place]) 1980   legs weak  . Tremor   . Urinary incontinence   . Vertigo     Patient Active Problem List   Diagnosis Date Noted  . Paresthesia 07/27/2018  . Neck pain 07/21/2018  . Breast cancer metastasized to axillary lymph node, right ([place]) 07/02/2018  . Malignant  neoplasm of upper-outer quadrant of right breast in female, estrogen receptor positive ([place]) 06/17/2018  . Vertigo 12/23/2017  . Hypertension 12/23/2017  . Essential hypertension, benign 12/05/2017  . Chronic allergic rhinitis 12/05/2017  . Hypothyroidism due to acquired atrophy of thyroid 12/05/2017  . GERD without esophagitis 12/05/2017  . Arthritis/arthropathy of multiple joints 12/05/2017  . Aneurysm of descending thoracic aorta ([facility]) 12/05/2017  . Atypical pneumonia 11/29/2017  . Dyspnea on exertion 10/27/2017  . Dyslipidemia 10/27/2017  . Morbid obesity ([place]) 10/27/2017  . Hx of colonic polyps 12/03/2016  . Lactose intolerance 07/26/2016  . Tremor 01/29/2016  . Incontinence 08/06/2015  . Bacteremia   . Sepsis secondary to UTI ([place]) 07/25/2015  . Sleep apnea in adult   . Distal radius fracture, left 07/08/2015  . MDD (major depressive disorder), recurrent severe, without psychosis ([place]) 03/21/2015  . 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 Breast cancer of upper-outer quadrant of left female breast ([place]) 08/18/2013    Past Surgical History:  Procedure Laterality Date  . ABDOMINAL HYSTERECTOMY    . BACK SURGERY  2005   lumbar fusion  . BREAST LUMPECTOMY Left 2015  . BREAST LUMPECTOMY Right  07/02/2018   BREAST LUMPECTOMY WITH RADIOACTIVE SEED WITH RIGHT SEED TARGETED LYMPH NODE BIOPSY AND SENTINEL LYMPH NODE BIOPSY (Right Breast)  . BREAST LUMPECTOMY WITH NEEDLE LOCALIZATION AND AXILLARY SENTINEL LYMPH NODE BX Left 09/29/2013   Procedure: BREAST LUMPECTOMY WITH NEEDLE LOCALIZATION AND AXILLARY SENTINEL LYMPH NODE BX;  Surgeon: [name], MD;  Location: [facility];  Service: General;  Laterality: Left;  clean wound class  . BREAST LUMPECTOMY WITH RADIOACTIVE SEED AND AXILLARY LYMPH NODE DISSECTION Right 07/02/2018   Procedure: BREAST LUMPECTOMY WITH RADIOACTIVE SEED WITH RIGHT SEED TARGETED LYMPH NODE BIOPSY AND SENTINEL LYMPH NODE BIOPSY;  Surgeon: [name], MD;   Location: [facility];  Service: General;  Laterality: Right;  . COLONOSCOPY    . MULTIPLE TOOTH EXTRACTIONS    . ORIF WRIST FRACTURE Left 07/08/2015   Procedure: OPEN REDUCTION INTERNAL FIXATION (ORIF) LEFT WRIST FRACTURE AND REPAIR AS INDICATED;  Surgeon: Iran Planas, MD;  Location: [facility];  Service: Orthopedics;  Laterality: Left;  . ovarian cysts    . TOTAL HIP ARTHROPLASTY  2005   rt total hip     OB History   No obstetric history on file.      Home Medications    Prior to Admission medications   Medication Sig Start Date End Date Taking? Authorizing Provider  albuterol (PROVENTIL HFA;VENTOLIN HFA) 108 (90 Base) MCG/ACT inhaler Inhale 2 puffs into the lungs every 6 (six) hours as needed for wheezing or shortness of breath. 07/21/18  Yes Hennie Duos, MD  amLODipine (NORVASC) 10 MG tablet Take 1 tablet (10 mg total) by mouth daily. 07/21/18  Yes Hennie Duos, MD  atenolol (TENORMIN) 25 MG tablet Take 1 tablet (25 mg total) by mouth at bedtime. 07/21/18  Yes Hennie Duos, MD  Cholecalciferol (VITAMIN D3 PO) Take by mouth.   Yes [provider]  clonazePAM (KLONOPIN) 1 MG tablet Take 1 tablet (1 mg total) by mouth 3 (three) times daily. Patient taking differently: Take 0.5-1 mg by mouth 3 (three) times daily.  07/21/18  Yes Hennie Duos, MD  escitalopram (LEXAPRO) 20 MG tablet Take 1 tablet (20 mg total) by mouth at bedtime. 07/21/18  Yes Hennie Duos, MD  fluticasone [facility]) 50 MCG/ACT nasal spray Place 1 spray into both nostrils daily as needed for allergies. 07/21/18  Yes Hennie Duos, MD  guaiFENesin-dextromethorphan (ROBITUSSIN DM) 100-10 MG/5ML syrup Take 5 mLs by mouth every 4 (four) hours as needed for cough. 12/04/17  Yes Purohit, Konrad Dolores, MD  levothyroxine (SYNTHROID, LEVOTHROID) 50 MCG tablet Take 1 tablet (50 mcg total) by mouth daily before breakfast. 07/21/18  Yes Hennie Duos, MD  Loperamide HCl (IMODIUM PO) Take 2 mg by mouth  daily as needed (diarrhea).    Yes [provider]  meclizine (ANTIVERT) 12.5 MG tablet Take 12.5 mg by mouth at bedtime as needed for dizziness. 09/21/18  Yes [provider]  montelukast (SINGULAIR) 10 MG tablet Take 1 tablet (10 mg total) by mouth at bedtime. 07/21/18  Yes Hennie Duos, MD  naproxen sodium (ANAPROX) 220 MG tablet Take 440 mg by mouth 2 (two) times daily as needed (pain).    Yes [provider]  Thiamine HCl (VITAMIN B-1 PO) Take by mouth.   Yes [provider]  acetaminophen (TYLENOL) 325 MG tablet Take 2 tablets (650 mg total) by mouth every 6 (six) hours as needed for mild pain, fever or headache (or Fever >/= 101). Patient not  taking: Reported on 10/14/2018 07/03/18   [name], MD  doxycycline (DORYX) 100 MG EC tablet Take 1 tablet (100 mg total) by mouth 2 (two) times daily. 1 tab po bid x 7 days right breast surgical incision wound ; stop date 11/29 Patient not taking: Reported on 10/14/2018 08/06/18   Hayden Pedro, PA-C  letrozole [facility]) 2.5 MG tablet Take 1 tablet (2.5 mg total) by mouth daily. Patient not taking: Reported on 10/14/2018 08/10/18   Nicholas Lose, MD  levETIRAcetam (KEPPRA) 500 MG tablet Take 1 tablet (500 mg total) by mouth 2 (two) times daily. 10/14/18   Dorie Rank, MD    Family History Family History  Problem Relation Age of Onset  . Lung cancer Brother   . Diabetes Brother   . Stroke Mother   . Ataxia Neg Hx   . Chorea Neg Hx   . Dementia Neg Hx   . Mental retardation Neg Hx   . Migraines Neg Hx   . Multiple sclerosis Neg Hx   . Neurofibromatosis Neg Hx   . Neuropathy Neg Hx   . Parkinsonism Neg Hx   . Seizures Neg Hx     Social History Social History   Tobacco Use  . Smoking status: Never Smoker  . Smokeless tobacco: Never Used  Substance Use Topics  . Alcohol use: No  . Drug use: No     Allergies   Codeine   Review of Systems Review of Systems  Neurological: Positive  for headaches.  All other systems reviewed and are negative.    Physical Exam Updated Vital Signs BP (!) 154/87   Pulse (!) 58   Temp 97.6 F (36.4 C) (Oral)   Resp 18   Ht 1.6 m (5\' 3" )   Wt 107 kg   SpO2 97%   BMI 41.81 kg/m   Physical Exam Vitals signs and nursing note reviewed.  Constitutional:      General: She is not in acute distress.    Appearance: She is well-developed.  HENT:     Head: Normocephalic and atraumatic.     Right Ear: External ear normal.     Left Ear: External ear normal.  Eyes:     General: No scleral icterus.       Right eye: No discharge.        Left eye: No discharge.     Conjunctiva/sclera: Conjunctivae normal.  Neck:     Musculoskeletal: Neck supple.     Trachea: No tracheal deviation.  Cardiovascular:     Rate and Rhythm: Normal rate and regular rhythm.  Pulmonary:     Effort: Pulmonary effort is normal. No respiratory distress.     Breath sounds: Normal breath sounds. No stridor. No wheezing or rales.  Abdominal:     General: Bowel sounds are normal. There is no distension.     Palpations: Abdomen is soft.     Tenderness: There is no abdominal tenderness. There is no guarding or rebound.  Musculoskeletal:        General: No tenderness.  Skin:    General: Skin is warm and dry.     Findings: No rash.  Neurological:     Mental Status: She is alert.     Cranial Nerves: No cranial nerve deficit (no facial droop, extraocular movements intact, no slurred speech).     Sensory: No sensory deficit.     Motor: No abnormal muscle tone or seizure activity.     Coordination: Coordination normal.  ED Treatments / Results  Labs (all labs ordered are listed, but only abnormal results are displayed) Labs Reviewed  COMPREHENSIVE METABOLIC PANEL - Abnormal; Notable for the following components:      Result Value   Calcium 8.8 (*)    All other components within normal limits  CBC WITH DIFFERENTIAL/PLATELET - Abnormal; Notable for the  following components:   HCT 49.5 (*)    MCV 100.6 (*)    Platelets 126 (*)    All other components within normal limits  CBC WITH DIFFERENTIAL/PLATELET  CBG MONITORING, ED    EKG EKG Interpretation  Date/Time:  Wednesday October 14 2018 15:02:29 EST Ventricular Rate:  57 PR Interval:    QRS Duration: 92 QT Interval:  499 QTC Calculation: 486 R Axis:   14 Text Interpretation:  Sinus rhythm Abnormal R-wave progression, late transition Consider left ventricular hypertrophy Borderline prolonged QT interval No significant change since last tracing Confirmed by Dorie Rank [phone]) on 10/14/2018 4:13:17 PM   Radiology Mr Brain Wo Contrast  Result Date: 10/14/2018 CLINICAL DATA:  Seizure activity. Chronic neck pain. History of breast cancer, pseudoseizures. EXAM: MRI HEAD WITHOUT CONTRAST MRI CERVICAL SPINE WITHOUT CONTRAST TECHNIQUE: Multiplanar, multiecho pulse sequences of the brain and surrounding structures, and cervical spine, to include the craniocervical junction and cervicothoracic junction, were obtained without intravenous contrast. COMPARISON:  None. CT HEAD September 20, 2016 FINDINGS: MRI HEAD FINDINGS-moderately motion degraded examination. INTRACRANIAL CONTENTS: No reduced diffusion to suggest acute ischemia. No susceptibility artifact to suggest hemorrhage. No parenchymal brain volume loss for age. No hydrocephalus. Patchy to confluent supratentorial and pontine white matter FLAIR T2 hyperintensities. Old small LEFT cerebellar infarct. Old small basal ganglia infarcts. Hazy T2 hyperintense signal bilateral thalami seen with chronic small vessel ischemic changes. No suspicious parenchymal signal, masses, mass effect. No abnormal extra-axial fluid collections. No extra-axial masses. VASCULAR: Normal major intracranial vascular flow voids present at skull base. 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 SKULL AND UPPER CERVICAL SPINE: No abnormal sellar expansion. No suspicious  calvarial bone marrow signal. Craniocervical junction maintained. SINUSES/ORBITS: The mastoid air-cells and included paranasal sinuses are well-aerated.The included ocular globes and orbital contents are non-suspicious. OTHER: Patient is edentulous. MRI CERVICAL SPINE FINDINGS-moderate to severely motion degraded examination, not improved on repeat sequence acquisition. ALIGNMENT: Broad reversed cervical lordosis.  No malalignment. VERTEBRAE/DISCS: Vertebral bodies are intact. Moderate C4-5 through C6-7 disc height loss with diffuse disc desiccation. Moderate C5-6 C6-7 chronic discogenic endplate changes. No abnormal or acute bone marrow signal, limited evaluation due to motion. CORD:Limited assessment for spinal cord signal abnormality due to motion. No syrinx or definite myelomalacia. POSTERIOR FOSSA, VERTEBRAL ARTERIES, PARASPINAL TISSUES: No MR findings of ligamentous injury. Vertebral artery flow voids present. Included posterior fossa and paraspinal soft tissues are non suspicious. DISC LEVELS: Limited evaluation due to moderate to severely motion degraded sequences. Mild probable canal stenosis C4-5 and C5-6. Nondiagnostic assessment of neural foramen. IMPRESSION: MRI HEAD: 1. No acute intracranial process on this moderately motion degraded examination. 2. 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 Recommend CTA versus MRA head when patient is better able to remain still. 3. Moderate to severe chronic small vessel ischemic changes. Old small basal ganglia and LEFT cerebellar infarcts. MRI CERVICAL SPINE: 1. Moderate to severely motion degraded examination without acute osseous process. 2. Mild suspected canal stenosis C4-5 and C5-6. Electronically Signed   By: Elon Alas M.D.   On: 10/14/2018 18:37   Mr Cervical Spine Wo Contrast  Result Date: 10/14/2018  CLINICAL DATA:  Seizure activity. Chronic neck pain. History of breast cancer, pseudoseizures. EXAM: MRI HEAD WITHOUT CONTRAST MRI CERVICAL  SPINE WITHOUT CONTRAST TECHNIQUE: Multiplanar, multiecho pulse sequences of the brain and surrounding structures, and cervical spine, to include the craniocervical junction and cervicothoracic junction, were obtained without intravenous contrast. COMPARISON:  None. CT HEAD September 20, 2016 FINDINGS: MRI HEAD FINDINGS-moderately motion degraded examination. INTRACRANIAL CONTENTS: No reduced diffusion to suggest acute ischemia. No susceptibility artifact to suggest hemorrhage. No parenchymal brain volume loss for age. No hydrocephalus. Patchy to confluent supratentorial and pontine white matter FLAIR T2 hyperintensities. Old small LEFT cerebellar infarct. Old small basal ganglia infarcts. Hazy T2 hyperintense signal bilateral thalami seen with chronic small vessel ischemic changes. No suspicious parenchymal signal, masses, mass effect. No abnormal extra-axial fluid collections. No extra-axial masses. VASCULAR: Normal major intracranial vascular flow voids present at skull base. 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 SKULL AND UPPER CERVICAL SPINE: No abnormal sellar expansion. No suspicious calvarial bone marrow signal. Craniocervical junction maintained. SINUSES/ORBITS: The mastoid air-cells and included paranasal sinuses are well-aerated.The included ocular globes and orbital contents are non-suspicious. OTHER: Patient is edentulous. MRI CERVICAL SPINE FINDINGS-moderate to severely motion degraded examination, not improved on repeat sequence acquisition. ALIGNMENT: Broad reversed cervical lordosis.  No malalignment. VERTEBRAE/DISCS: Vertebral bodies are intact. Moderate C4-5 through C6-7 disc height loss with diffuse disc desiccation. Moderate C5-6 C6-7 chronic discogenic endplate changes. No abnormal or acute bone marrow signal, limited evaluation due to motion. CORD:Limited assessment for spinal cord signal abnormality due to motion. No syrinx or definite myelomalacia. POSTERIOR FOSSA, VERTEBRAL ARTERIES,  PARASPINAL TISSUES: No MR findings of ligamentous injury. Vertebral artery flow voids present. Included posterior fossa and paraspinal soft tissues are non suspicious. DISC LEVELS: Limited evaluation due to moderate to severely motion degraded sequences. Mild probable canal stenosis C4-5 and C5-6. Nondiagnostic assessment of neural foramen. IMPRESSION: MRI HEAD: 1. No acute intracranial process on this moderately motion degraded examination. 2. 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 Recommend CTA versus MRA head when patient is better able to remain still. 3. Moderate to severe chronic small vessel ischemic changes. Old small basal ganglia and LEFT cerebellar infarcts. MRI CERVICAL SPINE: 1. Moderate to severely motion degraded examination without acute osseous process. 2. Mild suspected canal stenosis C4-5 and C5-6. Electronically Signed   By: Elon Alas M.D.   On: 10/14/2018 18:37    Procedures Procedures (including critical care time)  Medications Ordered in ED Medications  0.9 %  sodium chloride infusion ( Intravenous New Bag/Given (Non-Interop) 10/14/18 1612)  levETIRAcetam (KEPPRA) tablet 500 mg (has no administration in time range)  LORazepam (ATIVAN) injection 1 mg (1 mg Intravenous Given 10/14/18 1613)     Initial Impression / Assessment and Plan / ED Course  I have reviewed the triage vital signs and the nursing notes.  Pertinent labs & imaging results that were available during my care of the patient were reviewed by me and considered in my medical decision making (see chart for details).   Patient presented to the emergency room for evaluation of possible seizures.  Patient had a witnessed episode in the emergency room by nurse.  She was awake and able to respond during this event.  Patient symptoms were not consistent with epileptic seizure.  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  MRI does not show any evidence of malignancy.  Possible  5 mm aneurysm noted.  Patient is not having any acute headache.  Think this is incidental and not related to her symptoms today.  Reviewed Dr Rhea Belton note.  Will dc home with keppra.  patient is stable for outpatient follow-up  Final Clinical Impressions(s) / ED Diagnoses   Final diagnoses:  Convulsions, unspecified convulsion type [facility])    ED Discharge Orders         Ordered    levETIRAcetam (KEPPRA) 500 MG tablet  2 times daily     10/14/18 2052           Dorie Rank, MD 10/14/18 2055

## 2018-10-14 NOTE — Telephone Encounter (Signed)
I called the patient and left her a voicemail with the appt information (date, time, location).  Provided our number to call back, if this she has questions or needs to change the appt.  I also called [facility] [phone]) and spoke to [place].  She took the appt information and will provide to the patient, if she is still in the office.

## 2018-10-14 NOTE — Progress Notes (Signed)
Prior to going to the ED the patient clarified that she wishes to be DNR and for her son to be the only point of contact to share medical information with. Orders were placed for DNR.     Carola Rhine, PAC

## 2018-10-14 NOTE — Progress Notes (Signed)
The patient has a complex history of pseudo seizure like activity and has been in the midst of a work up with neurology for this in November 2019, but due her breast cancer diagnosis she did not complete this. She had an episode of what staff described as a seizure yesterday following treatment after hours and she want home without incident. She reports that she's had more frequent and increasing duration of these shaking episodes. She had two witnessed episodes each lasting 10-30 seconds starting in the upper torso and affecting the entire body. She did not lose consciousness, did not bite her tongue or loose bowel or bladder control. She does have stress incontinence and urgency as well. After her episodes I evaluated her and she was coherent and was alert and oriented to person place and time. She is afebrile with stable vital signs. She will be taken to the ED for further evaluation as she lives alone and should not go home unaccompanied as she has not completed her work up. Though not anticipated, given node positive breast cancer, she should have an MRI to rule out metastatic disease. We will follow her course expectantly while she is hospitalized.     Carola Rhine, PAC

## 2018-10-14 NOTE — ED Notes (Signed)
Bed: WA20 Expected date:  Expected time:  Means of arrival:  Comments: 73 y/o seizure radiation oncology

## 2018-10-14 NOTE — ED Notes (Signed)
RETURNED FROM MRI. PT TALKING ON HER PHONE.

## 2018-10-14 NOTE — ED Notes (Signed)
WITNESS PT MAKING MOANING NOISE. PT STATES " SHE BELIEVES HER NERVES ARE GOING TO BE ACTING UP." NO FULLY BODY MOVEMENT WITNESSED. ASKED PT HER NAME, SHE RESPONDED "[name].". ASKED PT WHERE SHE LIVED AND SHE WAS ABLE TO SAY "[place]."  THIS EVENT LASTING > THAN A MINUTE. PT ABLE TO RECALL EVENT. EDP J KNAPP AWARE. PT BELIEVED IT WAS HER NERVES. NO TRAUMA NOTED

## 2018-10-14 NOTE — Telephone Encounter (Signed)
Received phone call from PA, patient had witnessed seizure while receiving radiation treatment for her breast cancer.  Advised her to start Keppra 500 mg twice daily,  Also order EEG, Previously ordered MRI of the brain, cervical spine,  Please make sure it will be completed, and put her on follow-up visit with me in 1 to 2 weeks

## 2018-10-14 NOTE — Discharge Instructions (Signed)
Follow-up with your neurologist.  The MRI suggested the possibility of an aneurysm on the right middle cerebral artery.  The radiologist recommended CT or MR angiogram to assess further.  Return to the emergency room as needed for worsening symptoms.  Continue your current medications.

## 2018-10-14 NOTE — ED Notes (Signed)
ED Provider at bedside. J KNAPP

## 2018-10-14 NOTE — Telephone Encounter (Signed)
Left voicemail with my contact information. Told patient that I would be able to help her with transportation and to call back if she would like more information.

## 2018-10-15 ENCOUNTER — Ambulatory Visit
Admission: RE | Admit: 2018-10-15 | Discharge: 2018-10-15 | Disposition: A | Discharge status: Home / Self Care | Payer: Medicare Other | Source: Ambulatory Visit | Attending: Radiation Oncology | Admitting: Radiation Oncology

## 2018-10-15 DIAGNOSIS — Z510 Encounter for antineoplastic radiation therapy: Secondary | ICD-10-CM | POA: Diagnosis not present

## 2018-10-16 ENCOUNTER — Ambulatory Visit
Admission: RE | Admit: 2018-10-16 | Discharge: 2018-10-16 | Disposition: A | Discharge status: Home / Self Care | Payer: Medicare Other | Source: Ambulatory Visit | Attending: Radiation Oncology | Admitting: Radiation Oncology

## 2018-10-16 DIAGNOSIS — Z170 Estrogen receptor positive status [ER+]: Principal | ICD-10-CM

## 2018-10-16 DIAGNOSIS — C50412 Malignant neoplasm of upper-outer quadrant of left female breast: Secondary | ICD-10-CM

## 2018-10-16 DIAGNOSIS — Z510 Encounter for antineoplastic radiation therapy: Secondary | ICD-10-CM | POA: Diagnosis not present

## 2018-10-16 MED ORDER — RADIAPLEXRX EX GEL
Freq: Once | CUTANEOUS | Status: AC
  Administered 2018-10-16: 17:00:00 via TOPICAL

## 2018-10-16 MED ORDER — ALRA NON-METALLIC DEODORANT (RAD-ONC)
1.0000 "application " | Freq: Once | TOPICAL | Status: AC
  Administered 2018-10-16: 1 via TOPICAL

## 2018-10-16 NOTE — Progress Notes (Signed)
Pt here for patient teaching.  Pt given Radiation and You booklet, skin care instructions, Alra deodorant and Radiaplex gel.  Reviewed areas of pertinence such as fatigue, hair loss, skin changes, breast tenderness and breast swelling . Pt able to give teach back of to pat skin and use unscented/gentle soap,apply Radiaplex bid, avoid applying anything to skin within 4 hours of treatment, avoid wearing an under wire bra and to use an electric razor if they must shave. Pt verbalizes understanding of information given and will contact nursing with any questions or concerns.     Blannie Shedlock M. Ezio Wieck RN, BSN      

## 2018-10-17 ENCOUNTER — Other Ambulatory Visit: Payer: Self-pay | Admitting: Internal Medicine

## 2018-10-19 ENCOUNTER — Telehealth: Payer: Self-pay | Admitting: Neurology

## 2018-10-19 ENCOUNTER — Ambulatory Visit
Admission: RE | Admit: 2018-10-19 | Discharge: 2018-10-19 | Disposition: A | Discharge status: Home / Self Care | Payer: Medicare Other | Source: Ambulatory Visit | Attending: Radiation Oncology | Admitting: Radiation Oncology

## 2018-10-19 DIAGNOSIS — Z510 Encounter for antineoplastic radiation therapy: Secondary | ICD-10-CM | POA: Diagnosis not present

## 2018-10-19 NOTE — Telephone Encounter (Signed)
Pt called informing us that the levETIRAcetam (KEPPRA) 500 MG tablet that was prescribed to her has actually made her shaking worse. Pt would like to know if there is anything else she can take in place of the [place]. Please advise.

## 2018-10-19 NOTE — Telephone Encounter (Addendum)
Returned call to the patient.  Reports since starting Keppra, her tremors have been worse (head, bilateral hands).  She would like to discuss other medication options.  She has not scheduled her EEG yet.  She would like to come in for an earlier appt.  She also admits to having added stress with her radiation treatments.    Per vo by Dr. Krista Blue, please place patient on NP scheduled this week.  She has been added to Sarah's schedule on 10/20/2018 at 2:45pm.  She is aware to arrive early for check-in.

## 2018-10-20 ENCOUNTER — Telehealth: Payer: Self-pay | Admitting: Neurology

## 2018-10-20 ENCOUNTER — Ambulatory Visit
Admission: RE | Admit: 2018-10-20 | Discharge: 2018-10-20 | Disposition: A | Discharge status: Home / Self Care | Payer: Medicare Other | Source: Ambulatory Visit | Attending: Radiation Oncology | Admitting: Radiation Oncology

## 2018-10-20 ENCOUNTER — Telehealth: Payer: Self-pay

## 2018-10-20 ENCOUNTER — Ambulatory Visit (INDEPENDENT_AMBULATORY_CARE_PROVIDER_SITE_OTHER): Payer: Medicare Other | Admitting: Neurology

## 2018-10-20 ENCOUNTER — Encounter: Payer: Self-pay | Admitting: Neurology

## 2018-10-20 VITALS — BP 144/96 | HR 56 | Ht 63.0 in | Wt 236.0 lb

## 2018-10-20 DIAGNOSIS — R251 Tremor, unspecified: Secondary | ICD-10-CM

## 2018-10-20 DIAGNOSIS — R569 Unspecified convulsions: Secondary | ICD-10-CM

## 2018-10-20 DIAGNOSIS — Z510 Encounter for antineoplastic radiation therapy: Secondary | ICD-10-CM | POA: Diagnosis not present

## 2018-10-20 DIAGNOSIS — F445 Conversion disorder with seizures or convulsions: Secondary | ICD-10-CM

## 2018-10-20 DIAGNOSIS — I671 Cerebral aneurysm, nonruptured: Secondary | ICD-10-CM

## 2018-10-20 MED ORDER — LAMOTRIGINE 25 MG PO TABS: 25.0000 mg | ORAL_TABLET | Freq: Every day | ORAL | 0 refills | Status: DC

## 2018-10-20 NOTE — Telephone Encounter (Signed)
[employer] medicare/medicaid order sent to [facility]. No auth they will reach out to the pt to schedule.  °

## 2018-10-20 NOTE — Telephone Encounter (Signed)
New script for Lamictal has been sent to the patient's pharmacy on file. Confirmation fax has been received.   [id number] - [place], [place] - [address] [place] [phone] (Phone) [phone] (Fax)

## 2018-10-20 NOTE — Telephone Encounter (Signed)
I attempted to call her son Erminie Foulks to discuss her medications and the titrating dose schedule. There was no answer. I will try to call again tomorrow.

## 2018-10-20 NOTE — Patient Instructions (Addendum)
Week Lamotrigine Keppra    1st week 25mg  twice a day 500mg  twice a day   2nd week 25mg x2 tab twice aday 500mg  twice a day   3rd week 25mg x3tab twice aday 500mg  once every day   4th week 25mg x4 tab twice aday Stop Keppra     100mg  twice a day          Lamotrigine tablets What is this medicine? LAMOTRIGINE (la MOE Hendricks Limes) is used to control seizures in adults and children with epilepsy and Lennox-Gastaut syndrome. It is also used in adults to treat bipolar disorder. This medicine may be used for other purposes; ask your health care provider or pharmacist if you have questions. COMMON BRAND NAME(S): Lamictal, Subvenite What should I tell my health care provider before I take this medicine? They need to know if you have any of these conditions: -aseptic meningitis during prior use of lamotrigine -depression -folate deficiency -kidney disease -liver disease -suicidal thoughts, plans, or attempt; a previous suicide attempt by you or a family member -an unusual or allergic reaction to lamotrigine or other seizure medications, other medicines, foods, dyes, or preservatives -pregnant or trying to get pregnant -breast-feeding How should I use this medicine? Take this medicine by mouth with a glass of water. Follow the directions on the prescription label. Do not chew these tablets. If this medicine upsets your stomach, take it with food or milk. Take your doses at regular intervals. Do not take your medicine more often than directed. A special MedGuide will be given to you by the pharmacist with each new prescription and refill. Be sure to read this information carefully each time. Talk to your pediatrician regarding the use of this medicine in children. While this drug may be prescribed for children as young as 2 years for selected conditions, precautions do apply. Overdosage: If you think you have taken too much of this medicine contact a poison control center or emergency room at once. NOTE:  This medicine is only for you. Do not share this medicine with others. What if I miss a dose? If you miss a dose, take it as soon as you can. If it is almost time for your next dose, take only that dose. Do not take double or extra doses. What may interact with this medicine? -atazanavir -carbamazepine -female hormones, including contraceptive or birth control pills -lopinavir -methotrexate -phenobarbital -phenytoin -primidone -pyrimethamine -rifampin -ritonavir -trimethoprim -valproic acid This list may not describe all possible interactions. Give your health care provider a list of all the medicines, herbs, non-prescription drugs, or dietary supplements you use. Also tell them if you smoke, drink alcohol, or use illegal drugs. Some items may interact with your medicine. What should I watch for while using this medicine? Visit your doctor or health care professional for regular checks on your progress. If you take this medicine for seizures, wear a Medic Alert bracelet or necklace. Carry an identification card with information about your condition, medicines, and doctor or health care professional. It is important to take this medicine exactly as directed. When first starting treatment, your dose will need to be adjusted slowly. It may take weeks or months before your dose is stable. You should contact your doctor or health care professional if your seizures get worse or if you have any new types of seizures. Do not stop taking this medicine unless instructed by your doctor or health care professional. Stopping your medicine suddenly can increase your seizures or their severity. Contact your doctor or  health care professional right away if you develop a rash while taking this medicine. Rashes may be very severe and sometimes require treatment in the hospital. Deaths from rashes have occurred. Serious rashes occur more often in children than adults taking this medicine. It is more common for  these serious rashes to occur during the first 2 months of treatment, but a rash can occur at any time. You may get drowsy, dizzy, or have blurred vision. Do not drive, use machinery, or do anything that needs mental alertness until you know how this medicine affects you. To reduce dizzy or fainting spells, do not sit or stand up quickly, especially if you are an older patient. Alcohol can increase drowsiness and dizziness. Avoid alcoholic drinks. If you are taking this medicine for bipolar disorder, it is important to report any changes in your mood to your doctor or health care professional. If your condition gets worse, you get mentally depressed, feel very hyperactive or manic, have difficulty sleeping, or have thoughts of hurting yourself or committing suicide, you need to get help from your health care professional right away. If you are a caregiver for someone taking this medicine for bipolar disorder, you should also report these behavioral changes right away. The use of this medicine may increase the chance of suicidal thoughts or actions. Pay special attention to how you are responding while on this medicine. Your mouth may get dry. Chewing sugarless gum or sucking hard candy, and drinking plenty of water may help. Contact your doctor if the problem does not go away or is severe. Women who become pregnant while using this medicine may enroll in the [place] Pregnancy Registry by calling [phone]. This registry collects information about the safety of antiepileptic drug use during pregnancy. This medicine may cause a decrease in folic acid. You should make sure that you get enough folic acid while you are taking this medicine. Discuss the foods you eat and the vitamins you take with your health care professional. What side effects may I notice from receiving this medicine? Side effects that you should report to your doctor or health care professional as soon as  possible: -allergic reactions like skin rash, itching or hives, swelling of the face, lips, or tongue -changes in vision -depressed mood -elevated mood, decreased need for sleep, racing thoughts, impulsive behavior -fever with rash, swollen lymph nodes, or swelling of the face -loss of balance or coordination -mouth sores -redness, blistering, peeling or loosening of the skin, including inside the mouth -right upper belly pain -seizures -severe muscle pain -signs and symptoms of aseptic meningitis such as stiff neck and sensitivity to light, headache, drowsiness, fever, nausea, vomiting, rash -signs of infection - fever or chills, cough, sore throat, pain or difficulty passing urine -suicidal thoughts or other mood changes -swollen lymph nodes -trouble walking -unusual bruising or bleeding -unusually weak or tired -yellowing of the eyes or skin Side effects that usually do not require medical attention (report to your doctor or health care professional if they continue or are bothersome): -diarrhea -dizziness -dry mouth -stuffy nose -tiredness -tremors -trouble sleeping This list may not describe all possible side effects. Call your doctor for medical advice about side effects. You may report side effects to FDA at 1-800-FDA-1088. Where should I keep my medicine? Keep out of reach of children. Store at room temperature between 15 and 30 degrees C (59 and 86 degrees F). Throw away any unused medicine after the expiration date. NOTE: This sheet  is a summary. It may not cover all possible information. If you have questions about this medicine, talk to your doctor, pharmacist, or health care provider.  2019 [employer] Standard (2017-03-31 16:07:39)

## 2018-10-20 NOTE — Progress Notes (Signed)
PATIENT: [name] DOB: 08-05-1946  REASON FOR VISIT: follow up HISTORY FROM: patient  HISTORY OF PRESENT ILLNESS: Today 10/20/18  HISTORY  [name] is a 73 years old female, seen in request by her primary care physician DrMorrow, Marjory Lies, MD for evaluation of seizure, initial evaluation was on July 21, 2018.  I have reviewed and summarized the referring note from the referring physician.  She had a past medical history of hypertension, depression, thyroid, on supplement  I was able to see discharge summary on July 05, 2018, she had a history of breast cancer metastasized to right axillary lymph node, previous history of left breast cancer, status post radiation therapy, right hip replacement.  She was admitted to the floor following right seed localized lumpectomy, seed targeted lymph node biopsy in the setting of lymph node biopsy of the right axillary region on July 02, 2018, was noted very shaky in the week postoperative, on postoperative day 1, she also reported dizziness, balance issues, was noted to have whole body tremorish, suggestive of pseudo seizure, was discharged to skilled nursing home [facility] on July 07, 2018.  Before this hospital admission, she lives by herself, she complains of stress in her life, depression anxiety, whole body shaking, jaw tremor, during her described seizure-like episode, she could hear people talking, but difficult for her to answering back, stuttered, she denied loss of consciousness.  She had gradual onset bilateral hands tremor since 2009, also had intermittent head titubation, jaw tremor.  Getting worse when she is stressful, feeling tired,  Laboratory evaluations showed normal CBC, hemoglobin of 14.9, CMP, creatinine of 0.9,   She also complains of neck pain, radiating pain to bilateral shoulder, bilateral upper extremity paresthesia  UPDATE October 20, 2018 SS: 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  Her initial evaluation in this office was in June 2017 for evaluation of tremor and severe seizure.  She is noted to have tremor since 2007, getting worse with stress, intermittent mild slurred speech, stuttering, feeling her whole body was tremoring lasting 5 to 10 minutes.  She never had loss of consciousness.  In 2017 she felt her symptoms have been healed by prayer.   On October 14, 2018 our office received a call from a physicians assistant reporting the patient had a witnessed seizure while receiving radiation treatment for breast cancer.  She was started on Keppra 500 mg twice daily and an EEG was ordered.  She called our office yesterday and reports that the [place] was actually making her shaking worse and was requesting something else to take in place of Keppra.  She also reports her tremors in her head and both hands have been worse and she would like to discuss medication options.  She is currently being treated for breast cancer with radiation, there will be no chemotherapy.  She was in the emergency department October 14, 2018 for possible seizure activity.  After reviewing the record she had a seizure episode on October 14, 2018 following cancer treatment.  The event was witnessed and was described as 2 episodes each lasting 10 to 30 seconds starting in the upper torso and affecting the entire body.  She did not lose consciousness, did not bite her tongue or lose bowel or bladder control.  She was evaluated by a PA and it was suggested she should be sent to the ED for further evaluation.  Apparently she had an episode while in the  ED where she was able to speak and communicate, witnessed by nurse.  She reports having shaking episodes in the cancer center, having full memory of the event and verbalizing feeling stressed.  MRI of the brain was done to rule out mets to the brain.  She was discharged home with Keppra.  She had MRI of the brain October 14, 2018 showing no acute intracranial process.  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  Moderate to severe chronic small vessel ischemic changes.  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  MRI of the cervical spine showed mild suspected canal stenosis C4-C5 and C5-C6.  She is at today's appointment alone, a friend from church drove her to the appointment.  She reports she is taking the Keppra and that her shaking has continued.  She reports that she lives alone however her son is nearby to help her.  She reports last night she had a bad episode of shaking, nervousness, anxiety.  She reports feeling very emotional lately, under stress and family issues.  She manages her medications.   REVIEW OF SYSTEMS: Out of a complete 14 system review of symptoms, the patient complains only of the following symptoms, and all other reviewed systems are negative.  Swollen abdomen, walking difficulty, memory loss    ALLERGIES: Allergies  Allergen Reactions  . Codeine Nausea And Vomiting    HOME MEDICATIONS: Outpatient Medications Prior to Visit  Medication Sig Dispense Refill  . albuterol (PROVENTIL HFA;VENTOLIN HFA) 108 (90 Base) MCG/ACT inhaler Inhale 2 puffs into the lungs every 6 (six) hours as needed for wheezing or shortness of breath. 1 Inhaler 2  . amLODipine (NORVASC) 10 MG tablet Take 1 tablet (10 mg total) by mouth daily. 30 tablet 0  . atenolol (TENORMIN) 25 MG tablet Take 1 tablet (25 mg total) by mouth at bedtime. 30 tablet 0  . Cholecalciferol (VITAMIN D3 PO) Take by mouth.    . clonazePAM (KLONOPIN) 1 MG tablet Take 1 tablet (1 mg total) by mouth 3 (three) times daily. (Patient taking differently: Take 0.5-1 mg by mouth 3 (three) times daily. ) 21 tablet 0  . escitalopram (LEXAPRO) 20 MG tablet Take 1 tablet (20 mg total) by mouth at bedtime. 30 tablet 0  . fluticasone (FLONASE) 50 MCG/ACT nasal spray Place 1 spray into both nostrils daily as needed for allergies.  1 g 0  . guaiFENesin-dextromethorphan (ROBITUSSIN DM) 100-10 MG/5ML syrup Take 5 mLs by mouth every 4 (four) hours as needed for cough. 118 mL 0  . letrozole (FEMARA) 2.5 MG tablet Take 1 tablet (2.5 mg total) by mouth daily. 90 tablet 3  . levETIRAcetam (KEPPRA) 500 MG tablet Take 1 tablet (500 mg total) by mouth 2 (two) times daily. 30 tablet 0  . levothyroxine (SYNTHROID, LEVOTHROID) 50 MCG tablet Take 1 tablet (50 mcg total) by mouth daily before breakfast. 30 tablet 0  . Loperamide HCl (IMODIUM PO) Take 2 mg by mouth daily as needed (diarrhea).     . meclizine (ANTIVERT) 12.5 MG tablet Take 12.5 mg by mouth at bedtime as needed for dizziness.    . montelukast (SINGULAIR) 10 MG tablet Take 1 tablet (10 mg total) by mouth at bedtime. 30 tablet 0  . naproxen sodium (ANAPROX) 220 MG tablet Take 440 mg by mouth 2 (two) times daily as needed (pain).     . Thiamine HCl (VITAMIN B-1 PO) Take by mouth.    Marland Kitchen acetaminophen (TYLENOL) 325 MG tablet  Take 2 tablets (650 mg total) by mouth every 6 (six) hours as needed for mild pain, fever or headache (or Fever >/= 101). (Patient not taking: Reported on 10/14/2018) 50 tablet 0  . doxycycline (DORYX) 100 MG EC tablet Take 1 tablet (100 mg total) by mouth 2 (two) times daily. 1 tab po bid x 7 days right breast surgical incision wound ; stop date 11/29 (Patient not taking: Reported on 10/14/2018) 14 tablet 0   No facility-administered medications prior to visit.     PAST MEDICAL HISTORY: Past Medical History:  Diagnosis Date  . Anemia   . Anxiety   . Arthritis   . Bladder incontinence   . Bowel incontinence   . Breast cancer ([place] [facility]) 08/16/13   left, 1 o'clock  . Chronic cystitis   . Chronic diarrhea   . Complication of anesthesia    hard to wake up  . Depression   . Full dentures   . GERD (gastroesophageal reflux disease)   . High blood pressure   . History of radiation therapy 11/02/13- 12/24/13   left breast 4680 cGy in 26 sessions, left breast boost  1400 cGy in 7 sessions  . HOH (hard of hearing)   . Hx of colonic polyps 12/03/2016  . Hypothyroidism   . Personal history of radiation therapy   . Pneumonia    hx  . Pseudoseizures   . Shortness of breath    when walks  . Sleep apnea    has a cpap-5 hr/ not used in years  . Stroke ([place]) 1980   legs weak  . Tremor   . Urinary incontinence   . Vertigo     PAST SURGICAL HISTORY: Past Surgical History:  Procedure Laterality Date  . ABDOMINAL HYSTERECTOMY    . BACK SURGERY  2005   lumbar fusion  . BREAST LUMPECTOMY Left 2015  . BREAST LUMPECTOMY Right 07/02/2018   BREAST LUMPECTOMY WITH RADIOACTIVE SEED WITH RIGHT SEED TARGETED LYMPH NODE BIOPSY AND SENTINEL LYMPH NODE BIOPSY (Right Breast)  . BREAST LUMPECTOMY WITH NEEDLE LOCALIZATION AND AXILLARY SENTINEL LYMPH NODE BX Left 09/29/2013   Procedure: BREAST LUMPECTOMY WITH NEEDLE LOCALIZATION AND AXILLARY SENTINEL LYMPH NODE BX;  Surgeon: [name], MD;  Location: [place];  Service: General;  Laterality: Left;  clean wound class  . BREAST LUMPECTOMY WITH RADIOACTIVE SEED AND AXILLARY LYMPH NODE DISSECTION Right 07/02/2018   Procedure: BREAST LUMPECTOMY WITH RADIOACTIVE SEED WITH RIGHT SEED TARGETED LYMPH NODE BIOPSY AND SENTINEL LYMPH NODE BIOPSY;  Surgeon: [name], MD;  Location: [place];  Service: General;  Laterality: Right;  . COLONOSCOPY    . MULTIPLE TOOTH EXTRACTIONS    . ORIF WRIST FRACTURE Left 07/08/2015   Procedure: OPEN REDUCTION INTERNAL FIXATION (ORIF) LEFT WRIST FRACTURE AND REPAIR AS INDICATED;  Surgeon: Iran Planas, MD;  Location: [place];  Service: Orthopedics;  Laterality: Left;  . ovarian cysts    . TOTAL HIP ARTHROPLASTY  2005   rt total hip    FAMILY HISTORY: Family History  Problem Relation Age of Onset  . Lung cancer Brother   . Diabetes Brother   . Stroke Mother   . Ataxia Neg Hx   . Chorea Neg Hx   . Dementia Neg Hx   . Mental retardation Neg Hx   . Migraines Neg Hx   .  Multiple sclerosis Neg Hx   . Neurofibromatosis Neg Hx   . Neuropathy Neg Hx   . Parkinsonism Neg Hx   .  Seizures Neg Hx     SOCIAL HISTORY: Social History   Socioeconomic History  . Marital status: Widowed    Spouse name: Not on file  . Number of children: 2  . Years of education: 10th grade  . Highest education level: Not on file  Occupational History  . Occupation: Retired  Scientific laboratory technician  . Financial resource strain: Not on file  . Food insecurity:    Worry: Not on file    Inability: Not on file  . Transportation needs:    Medical: No    Non-medical: No  Tobacco Use  . Smoking status: Never Smoker  . Smokeless tobacco: Never Used  Substance and Sexual Activity  . Alcohol use: No  . Drug use: No  . Sexual activity: Not Currently    Comment: menarche age 61, first live birth age 34, P63,  contraception x 40 years, menopause 32  Lifestyle  . Physical activity:    Days per week: Not on file    Minutes per session: Not on file  . Stress: Not on file  Relationships  . Social connections:    Talks on phone: Not on file    Gets together: Not on file    Attends religious service: Not on file    Active member of club or organization: Not on file    Attends meetings of clubs or organizations: Not on file    Relationship status: Not on file  . Intimate partner violence:    Fear of current or ex partner: Not on file    Emotionally abused: Not on file    Physically abused: Not on file    Forced sexual activity: Not on file  Other Topics Concern  . Not on file  Social History Narrative   Lives at home alone.   Right-handed.   1 cup caffeine per day.      PHYSICAL EXAM  Vitals:   10/20/18 1440  BP: (!) 144/96  Pulse: (!) 56  Weight: 236 lb (107 kg)  Height: 5\' 3"  (1.6 m)   Body mass index is 41.81 kg/m.  Generalized: Well developed, in no acute distress  Using a walker, head twitching to the right, mild tremor to right hand, able to follow all commands  well,  Neurological examination  Mentation: Alert oriented to time, place, history taking. Follows all commands speech and language fluent Cranial nerve II-XII: Pupils were equal round reactive to light. Extraocular movements were full, visual field were full on confrontational test. Facial sensation and strength were normal. Uvula tongue midline. Head turning and shoulder shrug  were normal and symmetric. Motor: The motor testing reveals 3 over 5 strength of all 4 extremities. Good symmetric motor tone is noted throughout.  Sensory: Sensory testing is intact to soft touch on all 4 extremities. No evidence of extinction is noted.  Coordination: Cerebellar testing reveals good finger-nose-finger and heel-to-shin bilaterally.  Gait and station: Gait is slowed and assisted with a walker. Reflexes: Deep tendon reflexes are symmetric and normal bilaterally.   DIAGNOSTIC DATA (LABS, IMAGING, TESTING) - I reviewed patient records, labs, notes, testing and imaging myself where available.  Lab Results  Component Value Date   WBC 5.2 10/14/2018   HGB 15.0 10/14/2018   HCT 49.5 (H) 10/14/2018   MCV 100.6 (H) 10/14/2018   PLT 126 (L) 10/14/2018      Component Value Date/Time   NA 140 10/14/2018 1529   NA 142 07/14/2018   NA 144 09/01/2013 1221  K 4.0 10/14/2018 1529   K 4.2 09/01/2013 1221   CL 103 10/14/2018 1529   CO2 31 10/14/2018 1529   CO2 30 (H) 09/01/2013 1221   GLUCOSE 84 10/14/2018 1529   GLUCOSE 82 09/01/2013 1221   BUN 18 10/14/2018 1529   BUN 22 (A) 07/14/2018   BUN 18.8 09/01/2013 1221   CREATININE 0.94 10/14/2018 1529   CREATININE 1.3 (H) 09/01/2013 1221   CALCIUM 8.8 (L) 10/14/2018 1529   CALCIUM 9.2 09/01/2013 1221   PROT 6.8 10/14/2018 1529   PROT 7.1 09/01/2013 1221   ALBUMIN 3.8 10/14/2018 1529   ALBUMIN 3.7 09/01/2013 1221   AST 27 10/14/2018 1529   AST 17 09/01/2013 1221   ALT 21 10/14/2018 1529   ALT 16 09/01/2013 1221   ALKPHOS 41 10/14/2018 1529   ALKPHOS  48 09/01/2013 1221   BILITOT 1.1 10/14/2018 1529   BILITOT 0.78 09/01/2013 1221   GFRNONAA >60 10/14/2018 1529   GFRAA >60 10/14/2018 1529   No results found for: CHOL, HDL, LDLCALC, LDLDIRECT, TRIG, CHOLHDL No results found for: HGBA1C No results found for: VITAMINB12 No results found for: TSH    ASSESSMENT AND PLAN 73 y.o. year old female  has a past medical history of Anemia, Anxiety, Arthritis, Bladder incontinence, Bowel incontinence, Breast cancer ([place]) (08/16/13), Chronic cystitis, Chronic diarrhea, Complication of anesthesia, Depression, Full dentures, GERD (gastroesophageal reflux disease), High blood pressure, History of radiation therapy (11/02/13- 12/24/13), HOH (hard of hearing), colonic polyps (12/03/2016), Hypothyroidism, Personal history of radiation therapy, Pneumonia, Pseudoseizures, Shortness of breath, Sleep apnea, Stroke ([place]) (1980), Tremor, Urinary incontinence, and Vertigo. here with:  1.  Seizure-like events, history of pseudoseizure 2.  Tremor, head and right arm  Today patient reports worsening tremor to her head and her right arm.  She is currently undergoing daily radiation for right breast cancer.  In the past she has been evaluated for pseudoseizure.  She has had several episodes in the last few days questionable for seizure activity.  She was started on Keppra 500 mg twice a day in the ER.  An MRI was done that showed a 5 mm MCA aneurysm.  Dr. Krista Blue came into the assessment room to evaluate the patient.  The patient appears nervous and anxious today.  She reports a lot of stress with her family and her cancer treatment.  We will start her on Lamictal titrating up to 100 mg twice a day.  She will continue taking Keppra and as we increased the Lamictal we will decrease Keppra.  A conversion chart was provided on her discharge instructions.  A prescription for Lamictal titrating up was sent to her pharmacy via fax.  I am concerned with her ability to correctly titrate the  medication and manage the administration.  She asked me to contact her son and a church member Opal Sidles.  I will do this to hopefully have them help her with the medication transition.  A CT head angiogram was ordered today to evaluate the aneurysm.  She will follow-up in our office in 6 weeks or sooner if needed.  An EEG has been ordered.  I spent a great length of time explaining the medication titration and adjustment schedule.  At this time I will not give her the prescription for full dose Lamictal 100 mg twice a day as I am afraid she will confuse the medications. We did discuss side effects of the medication, specifically for rash. Information about the drug were provided in the AVS.  Week Lamotrigine Keppra    1st week 25mg  twice a day 500mg  twice a day   2nd week 25mg x2 tab twice aday 500mg  twice a day   3rd week 25mg x3tab twice aday 500mg  once every day   4th week 25mg x4 tab twice aday Stop Keppra     100mg  twice a day           Butler Denmark, [place], DNP 10/20/2018, 4:21 PM [facility] [address] [place], [place] [address] [phone]

## 2018-10-21 ENCOUNTER — Telehealth: Payer: Self-pay | Admitting: Neurology

## 2018-10-21 ENCOUNTER — Ambulatory Visit
Admission: RE | Admit: 2018-10-21 | Discharge: 2018-10-21 | Disposition: A | Discharge status: Home / Self Care | Payer: Medicare Other | Source: Ambulatory Visit | Attending: Radiation Oncology | Admitting: Radiation Oncology

## 2018-10-21 DIAGNOSIS — Z510 Encounter for antineoplastic radiation therapy: Secondary | ICD-10-CM | POA: Diagnosis not present

## 2018-10-21 DIAGNOSIS — C50411 Malignant neoplasm of upper-outer quadrant of right female breast: Secondary | ICD-10-CM

## 2018-10-21 DIAGNOSIS — Z170 Estrogen receptor positive status [ER+]: Principal | ICD-10-CM

## 2018-10-21 MED ORDER — ALRA NON-METALLIC DEODORANT (RAD-ONC)
1.0000 "application " | Freq: Once | TOPICAL | Status: AC
  Administered 2018-10-21: 1 via TOPICAL

## 2018-10-21 MED ORDER — RADIAPLEXRX EX GEL
Freq: Once | CUTANEOUS | Status: AC
  Administered 2018-10-21: 18:00:00 via TOPICAL

## 2018-10-21 NOTE — Telephone Encounter (Signed)
I called the patient and let her know that I talked with her church friend, Opal Sidles [phone], [phone]) and she agreed to help with her new medications. Opal Sidles had already been to her house and helped her get organized to start taking lamotrigine. She voiced understanding of how to start taking the lamotrigine and keppra. She appreciated the call. I let her know to call us if she has any questions. Again, I told her she is not to drive until she is 6 months without seizure event. She agreed. Still unable to get in touch with her son.

## 2018-10-21 NOTE — Telephone Encounter (Signed)
I tried for the second time to call her son, Mortimer Fries. I left a voicemail for him to call our office back. I did speak with Ms. Lege's church friend, Opal Sidles. Ms. Rieger asked me to reach out to Opal Sidles and have her assist with the new medication lamotrigine. Opal Sidles agreed to help, and was appreciative for the call. I advised her that Danilynn should not be driving anywhere until she is seizure free for 6 months. Opal Sidles takes Jeslie to her appointments. She agreed to help Jelene with her medications. I tried to call Mairlyn, and update her, but there was no answer. I will try to call again.

## 2018-10-21 NOTE — Progress Notes (Signed)
I have reviewed and agreed above plan. 

## 2018-10-21 NOTE — Progress Notes (Signed)
Pt here for patient teaching.  Pt given Radiation and You booklet, skin care instructions, Alra deodorant and Radiaplex gel.  Reviewed areas of pertinence such as fatigue, hair loss, skin changes, breast tenderness and breast swelling . Pt able to give teach back of to pat skin and use unscented/gentle soap,apply Radiaplex bid, avoid applying anything to skin within 4 hours of treatment, avoid wearing an under wire bra and to use an electric razor if they must shave. Pt verbalizes understanding of information given and will contact nursing with any questions or concerns.     [name] M. Ariannah Arenson RN, BSN      

## 2018-10-22 ENCOUNTER — Ambulatory Visit
Admission: RE | Admit: 2018-10-22 | Discharge: 2018-10-22 | Disposition: A | Discharge status: Home / Self Care | Payer: Medicare Other | Source: Ambulatory Visit | Attending: Radiation Oncology | Admitting: Radiation Oncology

## 2018-10-22 DIAGNOSIS — Z510 Encounter for antineoplastic radiation therapy: Secondary | ICD-10-CM | POA: Diagnosis not present

## 2018-10-23 ENCOUNTER — Ambulatory Visit
Admission: RE | Admit: 2018-10-23 | Discharge: 2018-10-23 | Disposition: A | Discharge status: Home / Self Care | Payer: Medicare Other | Source: Ambulatory Visit | Attending: Radiation Oncology | Admitting: Radiation Oncology

## 2018-10-23 ENCOUNTER — Telehealth: Payer: Self-pay

## 2018-10-23 DIAGNOSIS — Z510 Encounter for antineoplastic radiation therapy: Secondary | ICD-10-CM | POA: Diagnosis not present

## 2018-10-23 NOTE — Telephone Encounter (Signed)
Received call from nurse in [place] stating pt was not sure if she was supposed to stop letrozole once XRT started.   Per Dr Geralyn Flash last office note pt is to stop taking letrozole once XRT starts. Called pt and lvm for her to return call so we can instruct her on what to do.

## 2018-10-26 ENCOUNTER — Telehealth: Payer: Self-pay

## 2018-10-26 ENCOUNTER — Ambulatory Visit
Admission: RE | Admit: 2018-10-26 | Discharge: 2018-10-26 | Disposition: A | Discharge status: Home / Self Care | Payer: Medicare Other | Source: Ambulatory Visit | Attending: Radiation Oncology | Admitting: Radiation Oncology

## 2018-10-26 DIAGNOSIS — Z510 Encounter for antineoplastic radiation therapy: Secondary | ICD-10-CM | POA: Insufficient documentation

## 2018-10-26 DIAGNOSIS — C50411 Malignant neoplasm of upper-outer quadrant of right female breast: Secondary | ICD-10-CM | POA: Diagnosis not present

## 2018-10-26 DIAGNOSIS — Z170 Estrogen receptor positive status [ER+]: Secondary | ICD-10-CM | POA: Diagnosis not present

## 2018-10-26 NOTE — Telephone Encounter (Signed)
Spoke with patient to instruct her to stop taking the Letrozole while she is having radiation.  Pt voiced understanding.  Scheduling message sent get pt set up for follow up with Dr. Lindi Adie after radiation is completed.  No further needs.

## 2018-10-27 ENCOUNTER — Ambulatory Visit
Admission: RE | Admit: 2018-10-27 | Discharge: 2018-10-27 | Disposition: A | Discharge status: Home / Self Care | Payer: Medicare Other | Source: Ambulatory Visit | Attending: Radiation Oncology | Admitting: Radiation Oncology

## 2018-10-27 ENCOUNTER — Telehealth: Payer: Self-pay | Admitting: Hematology and Oncology

## 2018-10-27 DIAGNOSIS — Z510 Encounter for antineoplastic radiation therapy: Secondary | ICD-10-CM | POA: Diagnosis not present

## 2018-10-27 NOTE — Telephone Encounter (Signed)
Scheduled appt per 3/2 sch message - pt is aware of appt date and time   

## 2018-10-28 ENCOUNTER — Ambulatory Visit
Admission: RE | Admit: 2018-10-28 | Discharge: 2018-10-28 | Disposition: A | Discharge status: Home / Self Care | Payer: Medicare Other | Source: Ambulatory Visit | Attending: Radiation Oncology | Admitting: Radiation Oncology

## 2018-10-28 DIAGNOSIS — Z510 Encounter for antineoplastic radiation therapy: Secondary | ICD-10-CM | POA: Diagnosis not present

## 2018-10-29 ENCOUNTER — Ambulatory Visit
Admission: RE | Admit: 2018-10-29 | Discharge: 2018-10-29 | Disposition: A | Discharge status: Home / Self Care | Payer: Medicare Other | Source: Ambulatory Visit | Attending: Radiation Oncology | Admitting: Radiation Oncology

## 2018-10-29 ENCOUNTER — Ambulatory Visit: Payer: Self-pay | Admitting: Neurology

## 2018-10-29 ENCOUNTER — Other Ambulatory Visit: Payer: Self-pay | Admitting: Neurology

## 2018-10-29 DIAGNOSIS — Z510 Encounter for antineoplastic radiation therapy: Secondary | ICD-10-CM | POA: Diagnosis not present

## 2018-10-29 MED ORDER — LEVETIRACETAM 500 MG PO TABS: 500.0000 mg | ORAL_TABLET | Freq: Two times a day (BID) | ORAL | 0 refills | Status: DC

## 2018-10-29 NOTE — Telephone Encounter (Signed)
Pt doubled up on keppra this morning by accident. She also lamotrigine. Pt said it makes her studder. She is very anxious. I have gotten her calmed down as she waits for RN to call her back asap. She can be reached at [phone]

## 2018-10-29 NOTE — Telephone Encounter (Signed)
Spoke with pt. She is titrating up on Lamictal, weaning off of Keppra. She saw Judson Roch on 2/25 and was provided with a titration schedule. She should be taking Keppra 500mg  bid until March 10, then qd until March 17. She sts. she accidentally took Keppra 2 tabs this am (1gm total). I spoke with Judson Roch, and since Keppra is not ER, advised pt. to continue with titration schedule as described above.  She needs #10 Keppra tabs to complete the schedule (b/c new rx. was not sent in on 2/25, as pt. still had some med at home), and [facility]. Titration schedule was verbally reinforced, and pt. verbalized understanding of same/fim

## 2018-10-29 NOTE — Telephone Encounter (Signed)
Patient is calling in for a refill of levETIRAcetam (KEPPRA) 500 MG tablet to be sent to  [id number] - [place], [place] [phone] (Phone) [phone] (Fax)

## 2018-10-30 ENCOUNTER — Ambulatory Visit
Admission: RE | Admit: 2018-10-30 | Discharge: 2018-10-30 | Disposition: A | Discharge status: Home / Self Care | Payer: Medicare Other | Source: Ambulatory Visit | Attending: Radiation Oncology | Admitting: Radiation Oncology

## 2018-10-30 DIAGNOSIS — Z510 Encounter for antineoplastic radiation therapy: Secondary | ICD-10-CM | POA: Diagnosis not present

## 2018-11-02 ENCOUNTER — Ambulatory Visit
Admission: RE | Admit: 2018-11-02 | Discharge: 2018-11-02 | Disposition: A | Discharge status: Home / Self Care | Payer: Medicare Other | Source: Ambulatory Visit | Attending: Radiation Oncology | Admitting: Radiation Oncology

## 2018-11-02 DIAGNOSIS — Z510 Encounter for antineoplastic radiation therapy: Secondary | ICD-10-CM | POA: Diagnosis not present

## 2018-11-03 ENCOUNTER — Ambulatory Visit
Admission: RE | Admit: 2018-11-03 | Discharge: 2018-11-03 | Disposition: A | Discharge status: Home / Self Care | Payer: Medicare Other | Source: Ambulatory Visit | Attending: Radiation Oncology | Admitting: Radiation Oncology

## 2018-11-03 DIAGNOSIS — Z510 Encounter for antineoplastic radiation therapy: Secondary | ICD-10-CM | POA: Diagnosis not present

## 2018-11-04 ENCOUNTER — Other Ambulatory Visit: Payer: Self-pay

## 2018-11-04 ENCOUNTER — Ambulatory Visit
Admission: RE | Admit: 2018-11-04 | Discharge: 2018-11-04 | Disposition: A | Discharge status: Home / Self Care | Payer: Medicare Other | Source: Ambulatory Visit | Attending: Radiation Oncology | Admitting: Radiation Oncology

## 2018-11-04 DIAGNOSIS — Z510 Encounter for antineoplastic radiation therapy: Secondary | ICD-10-CM | POA: Diagnosis not present

## 2018-11-05 ENCOUNTER — Other Ambulatory Visit: Payer: Self-pay

## 2018-11-05 ENCOUNTER — Ambulatory Visit
Admission: RE | Admit: 2018-11-05 | Discharge: 2018-11-05 | Disposition: A | Discharge status: Home / Self Care | Payer: Medicare Other | Source: Ambulatory Visit | Attending: Radiation Oncology | Admitting: Radiation Oncology

## 2018-11-05 DIAGNOSIS — Z510 Encounter for antineoplastic radiation therapy: Secondary | ICD-10-CM | POA: Diagnosis not present

## 2018-11-06 ENCOUNTER — Ambulatory Visit
Admission: RE | Admit: 2018-11-06 | Discharge: 2018-11-06 | Disposition: A | Discharge status: Home / Self Care | Payer: Medicare Other | Source: Ambulatory Visit | Attending: Radiation Oncology | Admitting: Radiation Oncology

## 2018-11-06 DIAGNOSIS — Z510 Encounter for antineoplastic radiation therapy: Secondary | ICD-10-CM | POA: Diagnosis not present

## 2018-11-09 ENCOUNTER — Other Ambulatory Visit: Payer: Self-pay

## 2018-11-09 ENCOUNTER — Ambulatory Visit
Admission: RE | Admit: 2018-11-09 | Discharge: 2018-11-09 | Disposition: A | Discharge status: Home / Self Care | Payer: Medicare Other | Source: Ambulatory Visit | Attending: Radiation Oncology | Admitting: Radiation Oncology

## 2018-11-09 DIAGNOSIS — Z510 Encounter for antineoplastic radiation therapy: Secondary | ICD-10-CM | POA: Diagnosis not present

## 2018-11-10 ENCOUNTER — Other Ambulatory Visit: Payer: Self-pay

## 2018-11-10 ENCOUNTER — Ambulatory Visit
Admission: RE | Admit: 2018-11-10 | Discharge: 2018-11-10 | Disposition: A | Discharge status: Home / Self Care | Payer: Medicare Other | Source: Ambulatory Visit | Attending: Radiation Oncology | Admitting: Radiation Oncology

## 2018-11-10 DIAGNOSIS — C50411 Malignant neoplasm of upper-outer quadrant of right female breast: Secondary | ICD-10-CM

## 2018-11-10 DIAGNOSIS — Z510 Encounter for antineoplastic radiation therapy: Secondary | ICD-10-CM | POA: Diagnosis not present

## 2018-11-10 DIAGNOSIS — Z170 Estrogen receptor positive status [ER+]: Principal | ICD-10-CM

## 2018-11-10 MED ORDER — RADIAPLEXRX EX GEL
Freq: Once | CUTANEOUS | Status: AC
  Administered 2018-11-10: 15:00:00 via TOPICAL

## 2018-11-11 ENCOUNTER — Other Ambulatory Visit: Payer: Self-pay

## 2018-11-11 ENCOUNTER — Ambulatory Visit
Admission: RE | Admit: 2018-11-11 | Discharge: 2018-11-11 | Disposition: A | Discharge status: Home / Self Care | Payer: Medicare Other | Source: Ambulatory Visit | Attending: Radiation Oncology | Admitting: Radiation Oncology

## 2018-11-11 DIAGNOSIS — Z510 Encounter for antineoplastic radiation therapy: Secondary | ICD-10-CM | POA: Diagnosis not present

## 2018-11-12 ENCOUNTER — Other Ambulatory Visit: Payer: Self-pay | Admitting: Neurology

## 2018-11-12 ENCOUNTER — Other Ambulatory Visit: Payer: Self-pay

## 2018-11-12 ENCOUNTER — Ambulatory Visit
Admission: RE | Admit: 2018-11-12 | Discharge: 2018-11-12 | Disposition: A | Discharge status: Home / Self Care | Payer: Medicare Other | Source: Ambulatory Visit | Attending: Radiation Oncology | Admitting: Radiation Oncology

## 2018-11-12 DIAGNOSIS — Z510 Encounter for antineoplastic radiation therapy: Secondary | ICD-10-CM | POA: Diagnosis not present

## 2018-11-12 MED ORDER — LAMOTRIGINE 100 MG PO TABS: 100.0000 mg | ORAL_TABLET | Freq: Two times a day (BID) | ORAL | 5 refills | Status: AC

## 2018-11-12 NOTE — Telephone Encounter (Signed)
I tried to call the patient and check in on how she was tolerating the Lamictal. I left a message. I received a refill request from the pharmacy. She was seen 10/20/2018 and started on Lamictal titrating up to 100 mg twice daily. At this point she should be up 75 mg twice daily. I advised her to call the office and ask to speak with me or the nurse to ensure she is correctly taking the medication and tolerating. I sent a prescription for Lamictal 100 mg tablets to her pharmacy.

## 2018-11-13 ENCOUNTER — Ambulatory Visit: Payer: Medicare Other | Admitting: Radiation Oncology

## 2018-11-13 ENCOUNTER — Ambulatory Visit
Admission: RE | Admit: 2018-11-13 | Discharge: 2018-11-13 | Disposition: A | Discharge status: Home / Self Care | Payer: Medicare Other | Source: Ambulatory Visit | Attending: Radiation Oncology | Admitting: Radiation Oncology

## 2018-11-13 ENCOUNTER — Other Ambulatory Visit: Payer: Self-pay

## 2018-11-13 DIAGNOSIS — Z510 Encounter for antineoplastic radiation therapy: Secondary | ICD-10-CM | POA: Diagnosis not present

## 2018-11-14 ENCOUNTER — Other Ambulatory Visit: Payer: Self-pay | Admitting: Internal Medicine

## 2018-11-16 ENCOUNTER — Ambulatory Visit
Admission: RE | Admit: 2018-11-16 | Discharge: 2018-11-16 | Disposition: A | Discharge status: Home / Self Care | Payer: Medicare Other | Source: Ambulatory Visit | Attending: Radiation Oncology | Admitting: Radiation Oncology

## 2018-11-16 ENCOUNTER — Other Ambulatory Visit: Payer: Self-pay

## 2018-11-16 DIAGNOSIS — Z510 Encounter for antineoplastic radiation therapy: Secondary | ICD-10-CM | POA: Diagnosis not present

## 2018-11-17 ENCOUNTER — Other Ambulatory Visit: Payer: Self-pay

## 2018-11-17 ENCOUNTER — Telehealth: Payer: Self-pay | Admitting: Neurology

## 2018-11-17 ENCOUNTER — Ambulatory Visit
Admission: RE | Admit: 2018-11-17 | Discharge: 2018-11-17 | Disposition: A | Discharge status: Home / Self Care | Payer: Medicare Other | Source: Ambulatory Visit | Attending: Radiation Oncology | Admitting: Radiation Oncology

## 2018-11-17 DIAGNOSIS — Z510 Encounter for antineoplastic radiation therapy: Secondary | ICD-10-CM | POA: Diagnosis not present

## 2018-11-17 NOTE — Telephone Encounter (Signed)
I called the patient to see how she was tolerating the Lamictal. She reports she stopped taking the Keppra and Lamictal. She said they made her feel more shaky and nervous. She says she doesn't have seizures, that it is her nerves. I advised her that we were trying to switch her over to Lamictal and she would eventually only be on that. She says she doesn't want to take the medicine because it made her worse. She doesn't feel she has seizures. I discussed her MRI of the brain findings. I had ordered CT angio of the head, but it hasn't been done yet. She needs EEG, which has been ordered. She is undergoing radiation currently. I advised her she needs to take the seizure medication, is still undergoing work up. She reports she doesn't want to. I talked with Dr. Krista Blue, she will have a revisit with Dr. Krista Blue in April. I advised her to get in touch with [facility] to get her CT angio head done. I advised her she should not drive at this time due to undergoing seizure workup. She verbalized understanding. Indicates that she is doing much better, but has a lot going on with her radiation treatments.

## 2018-11-18 ENCOUNTER — Ambulatory Visit: Payer: Medicare Other

## 2018-11-18 ENCOUNTER — Other Ambulatory Visit: Payer: Self-pay

## 2018-11-18 DIAGNOSIS — Z510 Encounter for antineoplastic radiation therapy: Secondary | ICD-10-CM | POA: Diagnosis not present

## 2018-11-19 ENCOUNTER — Other Ambulatory Visit: Payer: Self-pay

## 2018-11-19 ENCOUNTER — Ambulatory Visit
Admission: RE | Admit: 2018-11-19 | Discharge: 2018-11-19 | Disposition: A | Discharge status: Home / Self Care | Payer: Medicare Other | Source: Ambulatory Visit | Attending: Radiation Oncology | Admitting: Radiation Oncology

## 2018-11-19 ENCOUNTER — Ambulatory Visit: Payer: Medicare Other

## 2018-11-19 DIAGNOSIS — Z510 Encounter for antineoplastic radiation therapy: Secondary | ICD-10-CM | POA: Diagnosis not present

## 2018-11-20 ENCOUNTER — Other Ambulatory Visit: Payer: Self-pay

## 2018-11-20 ENCOUNTER — Ambulatory Visit
Admission: RE | Admit: 2018-11-20 | Discharge: 2018-11-20 | Disposition: A | Discharge status: Home / Self Care | Payer: Medicare Other | Source: Ambulatory Visit | Attending: Radiation Oncology | Admitting: Radiation Oncology

## 2018-11-20 DIAGNOSIS — Z510 Encounter for antineoplastic radiation therapy: Secondary | ICD-10-CM | POA: Diagnosis not present

## 2018-11-23 ENCOUNTER — Other Ambulatory Visit: Payer: Self-pay

## 2018-11-23 ENCOUNTER — Ambulatory Visit
Admission: RE | Admit: 2018-11-23 | Discharge: 2018-11-23 | Disposition: A | Discharge status: Home / Self Care | Payer: Medicare Other | Source: Ambulatory Visit | Attending: Radiation Oncology | Admitting: Radiation Oncology

## 2018-11-23 DIAGNOSIS — Z510 Encounter for antineoplastic radiation therapy: Secondary | ICD-10-CM | POA: Diagnosis not present

## 2018-11-24 ENCOUNTER — Ambulatory Visit
Admission: RE | Admit: 2018-11-24 | Discharge: 2018-11-24 | Disposition: A | Discharge status: Home / Self Care | Payer: Medicare Other | Source: Ambulatory Visit | Attending: Radiation Oncology | Admitting: Radiation Oncology

## 2018-11-24 ENCOUNTER — Ambulatory Visit: Payer: Medicare Other

## 2018-11-24 ENCOUNTER — Other Ambulatory Visit: Payer: Self-pay

## 2018-11-24 DIAGNOSIS — Z510 Encounter for antineoplastic radiation therapy: Secondary | ICD-10-CM | POA: Diagnosis not present

## 2018-11-25 ENCOUNTER — Ambulatory Visit
Admission: RE | Admit: 2018-11-25 | Discharge: 2018-11-25 | Disposition: A | Discharge status: Home / Self Care | Payer: Medicare Other | Source: Ambulatory Visit | Attending: Radiation Oncology | Admitting: Radiation Oncology

## 2018-11-25 ENCOUNTER — Encounter: Payer: Self-pay | Admitting: Radiation Oncology

## 2018-11-25 ENCOUNTER — Inpatient Hospital Stay: Payer: Medicare Other | Attending: Hematology and Oncology | Admitting: Hematology and Oncology

## 2018-11-25 ENCOUNTER — Other Ambulatory Visit: Payer: Self-pay | Admitting: Internal Medicine

## 2018-11-25 ENCOUNTER — Other Ambulatory Visit: Payer: Self-pay

## 2018-11-25 VITALS — BP 157/102 | HR 70 | Temp 97.9°F | Resp 17 | Ht 63.0 in | Wt 228.8 lb

## 2018-11-25 DIAGNOSIS — L598 Other specified disorders of the skin and subcutaneous tissue related to radiation: Secondary | ICD-10-CM | POA: Diagnosis not present

## 2018-11-25 DIAGNOSIS — Z510 Encounter for antineoplastic radiation therapy: Secondary | ICD-10-CM | POA: Diagnosis not present

## 2018-11-25 DIAGNOSIS — C773 Secondary and unspecified malignant neoplasm of axilla and upper limb lymph nodes: Secondary | ICD-10-CM

## 2018-11-25 DIAGNOSIS — C50911 Malignant neoplasm of unspecified site of right female breast: Secondary | ICD-10-CM

## 2018-11-25 DIAGNOSIS — Z853 Personal history of malignant neoplasm of breast: Secondary | ICD-10-CM | POA: Diagnosis not present

## 2018-11-25 DIAGNOSIS — Z170 Estrogen receptor positive status [ER+]: Secondary | ICD-10-CM

## 2018-11-25 DIAGNOSIS — C50411 Malignant neoplasm of upper-outer quadrant of right female breast: Secondary | ICD-10-CM | POA: Insufficient documentation

## 2018-11-25 MED ORDER — LETROZOLE 2.5 MG PO TABS: 2.5000 mg | ORAL_TABLET | Freq: Every day | ORAL | 3 refills | Status: AC

## 2018-11-25 NOTE — Progress Notes (Signed)
Patient Care Team: London Pepper, MD as PCP - General (Family Medicine) Alvester Chou, NP (Nurse Practitioner) Kyung Rudd, MD as Consulting Physician (Radiation Oncology) Nicholas Lose, MD as Consulting Physician (Hematology and Oncology) [name], MD as Consulting Physician (General Surgery) Delice Bison Charlestine Massed, NP as Nurse Practitioner (Hematology and Oncology)  DIAGNOSIS:  Encounter Diagnoses  Name Primary?  . Malignant neoplasm of upper-outer quadrant of right breast in female, estrogen receptor positive ([place]) Yes  . 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 right breast irregular mass 2 x 1.4 x 2.6 cm ultrasound revealed 3 suspicious lymph nodes largest measuring 2.5 cm.  Biopsy 9:30 position: Grade 3 IDC with DCIS ER 100%, PR 90%, Ki-67 50%, HER-2 -1+ by IHC lymph node biopsy: Positive, T2N1    06/17/2018 Cancer Staging    Staging form: Breast, AJCC 8th Edition - Clinical: Stage IIB (cT2, cN1, cM0, G3, ER+, PR+, HER2-) - Signed by Nicholas Lose, MD on 06/17/2018    06/17/2018 Cancer Staging    Staging form: Breast, AJCC 8th Edition - Pathologic: Stage IIA (pT2, pN1a, cM0, G3, ER+, PR+, HER2-)  - Signed by Nicholas Lose, MD on 07/13/2018    07/02/2018 Surgery    Right lumpectomy: Grade 3 IDC with DCIS 2.5 cm, margins negative, 2/3 lymph nodes positive, ER 100%, PR 90%, HER-2 negative, Ki-67 50%, T2N1A    10/13/2018 -  Radiation Therapy    Adjuvant radiation     CHIEF COMPLIANT: Follow-up after completion of radiation therapy  INTERVAL HISTORY: [name] is a 73 year old with above-mentioned history of right breast cancer who underwent lumpectomy and today she finished with radiation.  She had a prolonged gap between surgery and radiation because of poor wound healing.  In the interim she was taking antiestrogen therapy and had tolerated it fairly well.  She is very happy to have finished with radiation.  She has significant radiation dermatitis.  REVIEW OF SYSTEMS:   Constitutional: Denies fevers, chills or abnormal weight loss Eyes: Denies blurriness of vision Ears, nose, mouth, throat, and face: Denies mucositis or sore throat Respiratory: Denies cough, dyspnea or wheezes Cardiovascular: Denies palpitation, chest discomfort Gastrointestinal:  Denies nausea, heartburn or change in bowel habits Skin: Denies abnormal skin rashes Lymphatics: Denies new lymphadenopathy or easy bruising Neurological:Denies numbness, tingling or new weaknesses Behavioral/Psych: Mood is stable, no new changes  Extremities: No lower extremity edema Breast: Radiation dermatitis All other systems were reviewed with the patient and are negative.  I have reviewed the past medical history, past surgical history, social history and family history with the patient and they are unchanged from previous note.  ALLERGIES:  is allergic to codeine.  MEDICATIONS:  Current Outpatient Medications  Medication Sig Dispense Refill  . acetaminophen (TYLENOL) 325 MG tablet Take 2 tablets (650 mg total) by mouth every 6 (six) hours as needed for mild pain, fever or headache (or Fever >/= 101). (Patient not taking:  Reported on 10/14/2018) 50 tablet 0  . albuterol (PROVENTIL HFA;VENTOLIN HFA) 108 (90 Base) MCG/ACT inhaler Inhale 2 puffs into the lungs every 6 (six) hours as needed for wheezing or shortness of breath. 1 Inhaler 2  . amLODipine (NORVASC) 10 MG tablet Take 1 tablet (10 mg total) by mouth daily. 30 tablet 0  . atenolol (TENORMIN) 25 MG tablet Take 1 tablet (25 mg total) by mouth at bedtime. 30 tablet 0  . Cholecalciferol (VITAMIN D3 PO) Take by mouth.    . clonazePAM (KLONOPIN) 1 MG tablet Take 1 tablet (1 mg total) by mouth 3 (three) times daily. (Patient taking differently: Take 0.5-1 mg by mouth 3 (three) times daily. ) 21 tablet 0  . escitalopram (LEXAPRO) 20 MG tablet Take 1 tablet (20 mg total) by mouth at bedtime. 30 tablet 0  . fluticasone (FLONASE) 50 MCG/ACT nasal spray Place 1 spray into both nostrils daily as needed for allergies. 1 g 0  . guaiFENesin-dextromethorphan (ROBITUSSIN DM) 100-10 MG/5ML syrup Take 5 mLs by mouth every 4 (four) hours as needed for cough. 118 mL 0  . lamoTRIgine (LAMICTAL) 100 MG tablet Take 1 tablet (100 mg total) by mouth 2 (two) times daily. 60 tablet 5  . letrozole (FEMARA) 2.5 MG tablet Take 1 tablet (2.5 mg total) by mouth daily. 90 tablet 3  . levothyroxine (SYNTHROID, LEVOTHROID) 50 MCG tablet Take 1 tablet (50 mcg total) by mouth daily before breakfast. 30 tablet 0  . Loperamide HCl (IMODIUM PO) Take 2 mg by mouth daily as needed (diarrhea).     . meclizine (ANTIVERT) 12.5 MG tablet Take 12.5 mg by mouth at bedtime as needed for dizziness.    . montelukast (SINGULAIR) 10 MG tablet Take 1 tablet (10 mg total) by mouth at bedtime. 30 tablet 0  . naproxen sodium (ANAPROX) 220 MG tablet Take 440 mg by mouth 2 (two) times daily as needed (pain).     . Thiamine HCl (VITAMIN B-1 PO) Take by mouth.     No current facility-administered medications for this visit.     PHYSICAL EXAMINATION: ECOG PERFORMANCE STATUS: 1 - Symptomatic but completely ambulatory   Vitals:   11/25/18 1512  BP: (!) 157/102  Pulse: 70  Resp: 17  Temp: 97.9 F (36.6 C)  SpO2: 95%   Filed Weights   11/25/18 1512  Weight: 228 lb 12.8 oz (103.8 kg)    GENERAL:alert, no distress and comfortable SKIN: skin color, texture, turgor are normal, no rashes or significant lesions EYES: normal, Conjunctiva are pink and non-injected, sclera clear OROPHARYNX:no exudate, no erythema and lips, buccal mucosa, and tongue normal  NECK: supple, thyroid normal size, non-tender, without nodularity LYMPH:  no palpable lymphadenopathy in the cervical, axillary or inguinal LUNGS: clear to auscultation and percussion with normal breathing effort HEART: regular rate & rhythm and no murmurs and no lower extremity edema ABDOMEN:abdomen soft, non-tender and normal bowel sounds MUSCULOSKELETAL:no cyanosis of digits and no clubbing  NEURO: alert & oriented x 3 with fluent speech, no focal motor/sensory deficits, uses a walker to get around EXTREMITIES: No lower extremity edema   LABORATORY DATA:  I have reviewed the data as listed CMP Latest Ref Rng & Units 10/14/2018 07/14/2018 07/04/2018  Glucose 70 -  99 mg/dL 84 - 107(H)  BUN 8 - 23 mg/dL 18 22(A) 36(H)  Creatinine 0.44 - 1.00 mg/dL 0.94 0.8 1.52(H)  Sodium 135 - 145 mmol/L 140 142 139  Potassium 3.5 - 5.1 mmol/L 4.0 4.7 4.5  Chloride 98 - 111 mmol/L 103 - 104  CO2 22 - 32 mmol/L 31 - 30  Calcium 8.9 - 10.3 mg/dL 8.8(L) - 8.2(L)  Total Protein 6.5 - 8.1 g/dL 6.8 - -  Total Bilirubin 0.3 - 1.2 mg/dL 1.1 - -  Alkaline Phos 38 - 126 U/L 41 - -  AST 15 - 41 U/L 27 - -  ALT 0 - 44 U/L 21 - -    Lab Results  Component Value Date   WBC 5.2 10/14/2018   HGB 15.0 10/14/2018   HCT 49.5 (H) 10/14/2018   MCV 100.6 (H) 10/14/2018   PLT 126 (L) 10/14/2018   NEUTROABS 3.3 10/14/2018    ASSESSMENT & PLAN:  Breast cancer metastasized to axillary lymph node, right ([place]) 06/01/2018:Prior history of left breast cancer. right breast irregular  mass 2 x 1.4 x 2.6 cm ultrasound revealed 3 suspicious lymph nodes largest measuring 2.5 cm.  Biopsy 9:30 position: Grade 3 IDC with DCIS ER 100%, PR 90%, Ki-67 50%, HER-2 -1+ by IHC lymph node biopsy: Positive, T2N1 stage IIb  (07/2013: left breast lumpectomy: Invasive ductal carcinoma 0.9 cm margins negative with DCIS and ALH, 2 sentinel nodes negative, T1 BN 0M0 stage Ia: Status post lumpectomy and radiation, refused antiestrogen therapy)  07/02/2018: Right lumpectomy: Grade 3 IDC with DCIS 2.5 cm, margins negative, 2/3 lymph nodes positive, ER 100%, PR 90%, HER-2 negative, Ki-67 50%, T2N1A  Patient has extremely poor wound healing and there was a delay with radiation. Because of this delay, she took letrozole 2.5 mg daily and tolerated it well Adjuvant radiation therapy completed 11/25/2018  Recommendation: Resume antiestrogen therapy with letrozole. 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  She does not want to take any seizure medications. I sent a request for mammogram to be done in October.  Return to clinic in 1 year for follow-up.     Orders Placed This Encounter  Procedures  . MM DIAG BREAST TOMO BILATERAL    Standing Status:   Future    Standing Expiration Date:   11/25/2019    Order Specific Question:   Reason for Exam (SYMPTOM  OR DIAGNOSIS REQUIRED)    Answer:   Breast cancer surveillance    Order Specific Question:   Preferred imaging location?    Answer:   [facility]   The patient has a good understanding of the overall plan. she agrees with it. she will call with any problems that may develop before the next visit here.   Harriette Ohara, MD 11/25/18

## 2018-11-25 NOTE — Assessment & Plan Note (Signed)
06/01/2018:Prior history of left breast cancer. right breast irregular mass 2 x 1.4 x 2.6 cm ultrasound revealed 3 suspicious lymph nodes largest measuring 2.5 cm.  Biopsy 9:30 position: Grade 3 IDC with DCIS ER 100%, PR 90%, Ki-67 50%, HER-2 -1+ by IHC lymph node biopsy: Positive, T2N1 stage IIb  (07/2013: left breast lumpectomy: Invasive ductal carcinoma 0.9 cm margins negative with DCIS and ALH, 2 sentinel nodes negative, T1 BN 0M0 stage Ia: Status post lumpectomy and radiation, refused antiestrogen therapy)  07/02/2018: Right lumpectomy: Grade 3 IDC with DCIS 2.5 cm, margins negative, 2/3 lymph nodes positive, ER 100%, PR 90%, HER-2 negative, Ki-67 50%, T2N1A  Patient has extremely poor wound healing and there was a delay with radiation. Because of this delay, she took letrozole 2.5 mg daily and tolerated it well Adjuvant radiation therapy completed 11/25/2018  Recommendation: Resume antiestrogen therapy with letrozole. 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  She does not want to take any seizure medications. I sent a request for mammogram to be done in October.  Return to clinic in 1 year for follow-up.

## 2018-11-26 ENCOUNTER — Ambulatory Visit: Payer: Medicare Other | Admitting: Hematology and Oncology

## 2018-11-26 ENCOUNTER — Telehealth: Payer: Self-pay | Admitting: Hematology and Oncology

## 2018-11-26 NOTE — Telephone Encounter (Signed)
Tried to reach regarding 4/5 I did leave a message

## 2018-11-30 ENCOUNTER — Other Ambulatory Visit: Payer: Medicare Other

## 2018-11-30 ENCOUNTER — Other Ambulatory Visit: Payer: Self-pay | Admitting: Internal Medicine

## 2018-12-01 ENCOUNTER — Ambulatory Visit: Payer: Medicare Other | Admitting: Neurology

## 2018-12-15 ENCOUNTER — Telehealth: Payer: Self-pay

## 2018-12-15 NOTE — Telephone Encounter (Signed)
I called and left a detailed message for patient and made her aware that due to the current COVID 19 pandemic, our office is severely limiting non-urgent visits to the office, in order to reduce exposure of the virus to our patients and staff. I explained that we would like to switch this appt to a virtual visit and explained what is needed. I asked that she call us back to let us know if she is able to do this or not. If so, please get an email address and if not, please offer a telephone visit.

## 2018-12-16 NOTE — Telephone Encounter (Signed)
Left another detailed message with the information below.  We have canceled her appt on 12/17/2018 for now.  Once we hear back from her, we can get her rescheduled. We should offer her a virtual visit first.

## 2018-12-17 ENCOUNTER — Ambulatory Visit: Payer: Medicare Other | Admitting: Neurology

## 2018-12-17 NOTE — Progress Notes (Signed)
  Radiation Oncology         [phone] ________________________________  Name: [name] MRN: [id number]  Date: 11/25/2018  DOB: [birth year]  End of Treatment Note  Diagnosis:   73 y.o. female with Stage IB, pT2N1M0 grade 2, ER/PR positive invasive ductal carcinoma of the right breast  Indication for treatment:  Curative       Radiation treatment dates:   10/12/2018 - 11/25/2018  Site/dose:   The patient initially received a dose of 50.4 Gy in 28 fractions to the right breast and supraclavicular region using whole-breast tangent fields. This was delivered using a 3-D conformal technique. The patient then received a boost to the surgical scar. This delivered an additional 10 Gy in 5 fractions using 6X, 15X photons with a Complex Isodose technique. The total dose was 60.4 Gy.  Narrative: The patient tolerated radiation treatment relatively well.   The patient had some expected skin irritation with diffuse erythema and desquamation as she progressed during treatment. Moist desquamation was present under the breast and axilla at the end of treatment. She is applying Neosporin to the areas of skin breakdown and Radiaplex gel everywhere else. She also noted increased fatigue.  Plan: The patient has completed radiation treatment. The patient will return to radiation oncology clinic for routine followup in one month. I advised the patient to call or return sooner if they have any questions or concerns related to their recovery or treatment. ________________________________  Jodelle Gross, MD, PhD  This document serves as a record of services personally performed by Kyung Rudd, MD. It was created on his behalf by Rae Lips, a trained medical scribe. The creation of this record is based on the scribe's personal observations and the provider's statements to them. This document has been checked and approved by the attending provider.

## 2018-12-23 ENCOUNTER — Telehealth: Payer: Self-pay | Admitting: Radiation Oncology

## 2018-12-23 NOTE — Telephone Encounter (Signed)
  Radiation Oncology         [phone] ________________________________  Name: [name] MRN: [id number]  Date of Service: 12/23/2018  DOB: 1945-11-05  Post Treatment Telephone Note  Diagnosis:   Stage IB, pT2N1M0 grade 2, ER/PR positive invasive ductal carcinoma of the right breast  Interval Since Last Radiation: 4 weeks   10/12/2018 - 11/25/2018: The patient initially received a dose of 50.4 Gy in 28 fractions to the right breast and supraclavicular region using whole-breast tangent fields. This was delivered using a 3-D conformal technique. The patient then received a boost to the surgical scar. This delivered an additional 10 Gy in 5 fractions using 6X, 15X photons with a Complex Isodose technique. The total dose was 60.4 Gy.  Narrative:  The patient was contacted today for routine follow-up. During treatment she did very well with radiotherapy and did not have significant desquamation. She did have episodes of seizure like activity and required evaluation on several occasions with neurology and was diagnosed with pseudoseizures. She declined to take Keppra. She reports she is doing well and recovering without concerns about the skin at this time. She has not had any seizure activity recently. No other complaints are noted.  Impression/Plan: 1. Stage IB, pT2N1M0 grade 2, ER/PR positive invasive ductal carcinoma of the right breast. The patient has been doing well since completion of radiotherapy. We discussed that we would be happy to continue to follow her as needed, but she will also continue to follow up with Dr. Lindi Adie in medical oncology. She was counseled on skin care as well as measures to avoid sun exposure to this area.  2. Survivorship. We discussed the importance of survivorship evaluation and was given the phone number for Ottis Stain [phone]) to be added to the list to receive the monthly resource calendar for the cancer center.  3. Pseudoseizures. She will follow  up with neurology regarding this.     Carola Rhine, PAC

## 2019-01-05 NOTE — Telephone Encounter (Signed)
I called pt, had a very extended phone call with her explaining a VV.    Pt understands that although there may be some limitations with this type of visit, we will take all precautions to reduce any security or privacy concerns.  Pt understands that this will be treated like an in office visit and we will file with pt's insurance, and there may be a patient responsible charge related to this service.  Pt's meds, allergies, and PMH were updated.   Pt received the [email or web address] link at her email of [email or web address]. She confirmed that she received this email. She verbalized understanding of the [email or web address] process.  I will ask Sarah's CMA to also help pt the day of the appt. She may need some extra assistance.

## 2019-01-07 ENCOUNTER — Other Ambulatory Visit: Payer: Self-pay

## 2019-01-07 ENCOUNTER — Telehealth: Payer: Self-pay | Admitting: Neurology

## 2019-01-07 ENCOUNTER — Encounter: Payer: Self-pay | Admitting: Neurology

## 2019-01-07 ENCOUNTER — Ambulatory Visit (INDEPENDENT_AMBULATORY_CARE_PROVIDER_SITE_OTHER): Payer: Medicare Other | Admitting: Neurology

## 2019-01-07 DIAGNOSIS — R569 Unspecified convulsions: Secondary | ICD-10-CM

## 2019-01-07 DIAGNOSIS — F445 Conversion disorder with seizures or convulsions: Secondary | ICD-10-CM

## 2019-01-07 DIAGNOSIS — R251 Tremor, unspecified: Secondary | ICD-10-CM

## 2019-01-07 NOTE — Progress Notes (Signed)
Virtual Visit via Video Note  I connected with [name] on 01/07/19 at  2:15 PM EDT by a video enabled telemedicine application and verified that I am speaking with the correct person using two identifiers.  Location: Patient: At her home Provider: At my home    I discussed the limitations of evaluation and management by telemedicine and the availability of in person appointments. The patient expressed understanding and agreed to proceed.  History of Present Illness: [name] a 73 years old female, seen in request byher primary care physician DrMorrow, [name], MDfor evaluation of seizure,initial evaluation was on July 21, 2018.  I have reviewed and summarized the referring note from the referring physician.She had a past medical history of hypertension, depression, thyroid, on supplement  I was able to see discharge summary on July 05, 2018, she had a history of breast cancer metastasized to right axillary lymph node,previous history of left breast cancer, status post radiation therapy, right hip replacement.  She was admitted to the floor following rightseedlocalized lumpectomy, seedtargetedlymph node biopsy in the setting of lymph node biopsy of the right axillary region on July 02, 2018, was noted very shaky in the week postoperative, on postoperative day 1, she also reported dizziness, balance issues, was noted to have whole body tremorish, suggestive of pseudo seizure, was discharged to skilled nursing home [facility] July 07, 2018.  Before this hospital admission, she lives by herself, she complains of stress in her life, depression anxiety, whole body shaking, jaw tremor, during her described seizure-like episode, she could hear people talking, but difficult for her to answering back, stuttered,she denied loss of consciousness.  She had gradual onset bilateral hands tremor since 2009, also had intermittent head titubation, jaw tremor.  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  Her initial evaluation in this office was in June 2017 for evaluation of tremor and severe seizure.  She is noted to have tremor since 2007, getting worse with stress, intermittent mild slurred speech, stuttering, feeling her whole body was tremoring lasting 5 to 10 minutes.  She never had loss of consciousness.  In 2017 she felt her symptoms have been healed by prayer.   On October 14, 2018 our office received a call from a physicians assistant reporting the patient had a witnessed seizure while receiving radiation treatment for breast cancer.  She was started on Keppra 500 mg twice daily and an EEG was ordered.  She called our office yesterday and reports that the [place] was actually making her shaking worse and was requesting something else to take in place of Keppra.  She also reports her tremors in her head and both hands have been worse and she would like to discuss medication options.  She is currently being treated for breast cancer with radiation, there will be no chemotherapy.  She was in the emergency department October 14, 2018 for possible seizure activity.  After reviewing the record she had a seizure episode on October 14, 2018 following cancer treatment.  The event was witnessed and was described as 2 episodes each lasting 10 to 30 seconds starting in the upper torso and affecting the entire body.  She did not lose consciousness, did  not bite her tongue or lose bowel or bladder control.  She was evaluated by a PA and it was suggested she should be sent to the ED for further evaluation.  Apparently she had an episode  while in the ED where she was able to speak and communicate, witnessed by nurse.  She reports having shaking episodes in the cancer center, having full memory of the event and verbalizing feeling stressed.  MRI of the brain was done to rule out mets to the brain.  She was discharged home with Keppra.  She had MRI of the brain October 14, 2018 showing no acute intracranial process.  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  Moderate to severe chronic small vessel ischemic changes.  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  MRI of the cervical spine showed mild suspected canal stenosis C4-C5 and C5-C6.  She is at today's appointment alone, a friend from church drove her to the appointment.  She reports she is taking the Keppra and that her shaking has continued.  She reports that she lives alone however her son is nearby to help her.  She reports last night she had a bad episode of shaking, nervousness, anxiety.  She reports feeling very emotional lately, under stress and family issues.  She manages her medications.  Update Jan 07, 2019 SS: She has stopped taking her Keppra and Lamictal. She says they made her feel more shaky and nervous. She is pending EEG and CT angio of her head. She has completed radiation treatment for breast cancer.  She says she did not have a true seizure.  She reports that she has body shaking as result of her nerves, anxiety.  She says she has had this her whole life.  She reports she will take clonazepam 3 times daily to help with her shakes.  Her shaking has been less frequent, she says the stress is more manageable in her life.  She has currently finished her radiation for breast cancer, is now doing oral chemotherapy.  She says she is doing much better since her last visit in February 2020.  She says when she gets these episodes of shakes, it generally occurs when she is very nervous, if she gets in a hurry or when she is stressed.  These events  have been doing much better.  She says her consciousness is not altered during the event.  She does not want to take anymore pills  Observations/Objective: Alert, head titubation noted, holding phone shaking on the phone, speech is clear, her mind is clear, her gait appears intact, moderate pace  Assessment and Plan: 1. Bilateral hand tremor, head titubation  2. 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 Cocuzza appears to be doing much better when compared to prior visit.  She seems to have a clear mind, appears to be coping better.  She will have EEG done, CT angio head needs to be done.  The order is still good, our scheduler will resend to [facility] imaging.  The CTA is in response to MRI of the brain done in February 2020 showing a suspected 5 mm right MCA bifurcation aneurysm.  She is agreeable to having this done.  She tells me that her shaking episodes are not seizures, they are pseudoseizures, related to her nerves/anxiety.  She is currently taking Klonopin 3 times daily.  She has finished her radiation for breast cancer, and is now taking oral chemotherapy.  She is currently undergoing work-up for seizure.  She is not willing to try seizure medication.  We were transitioning her from [place] to Lamictal.  She stopped the medications.  In February 2020 apparently she was taken to the emergency department for suspected seizure.  She tells me of the event today, says she sat up from the radiation table too quickly, got very nervous and anxious, triggered a shaking episode. Based on my last visit with Ms. Gaultney she seems to be doing much better, less stressed, less anxious, cleared mind.   Follow Up Instructions: 4 months with Dr. Krista Blue    I discussed the assessment and treatment plan with the patient. The patient was provided an opportunity to ask questions and all were answered. The patient agreed with the plan and demonstrated an understanding of the instructions.   The patient was advised to call  back or seek an in-person evaluation if the symptoms worsen or if the condition fails to improve as anticipated.  I provided 30 minutes of non-face-to-face time during this encounter.   Evangeline Dakin, DNP  [facility] [address], [place], [place] [address] [phone]

## 2019-01-07 NOTE — Telephone Encounter (Signed)
Can we get [name] scheduled for an EEG. The order was placed 10/14/2018

## 2019-01-11 NOTE — Progress Notes (Signed)
I have reviewed and agreed above plan. 

## 2019-01-22 ENCOUNTER — Other Ambulatory Visit: Payer: Self-pay | Admitting: Internal Medicine

## 2019-01-28 ENCOUNTER — Other Ambulatory Visit: Payer: Self-pay

## 2019-02-27 ENCOUNTER — Other Ambulatory Visit: Payer: Self-pay | Admitting: Internal Medicine

## 2019-04-27 ENCOUNTER — Encounter: Payer: Self-pay | Admitting: Neurology

## 2019-04-28 ENCOUNTER — Telehealth: Payer: Self-pay | Admitting: Hematology and Oncology

## 2019-04-28 NOTE — Telephone Encounter (Signed)
I talk with patient regarding move from April to march

## 2019-05-12 ENCOUNTER — Ambulatory Visit: Payer: Self-pay | Admitting: Neurology

## 2019-05-12 ENCOUNTER — Encounter: Payer: Self-pay | Admitting: Neurology

## 2019-05-12 ENCOUNTER — Telehealth: Payer: Self-pay | Admitting: *Deleted

## 2019-05-12 NOTE — Telephone Encounter (Signed)
No showed follow up appointment. 

## 2019-05-26 ENCOUNTER — Ambulatory Visit: Payer: Medicare Other | Admitting: Neurology

## 2019-06-16 ENCOUNTER — Ambulatory Visit (INDEPENDENT_AMBULATORY_CARE_PROVIDER_SITE_OTHER): Payer: Medicare Other | Admitting: Neurology

## 2019-06-16 ENCOUNTER — Other Ambulatory Visit: Payer: Self-pay

## 2019-06-16 ENCOUNTER — Encounter: Payer: Self-pay | Admitting: Neurology

## 2019-06-16 VITALS — BP 136/78 | HR 68 | Ht 64.0 in | Wt 225.0 lb

## 2019-06-16 DIAGNOSIS — F445 Conversion disorder with seizures or convulsions: Secondary | ICD-10-CM

## 2019-06-16 DIAGNOSIS — R42 Dizziness and giddiness: Secondary | ICD-10-CM | POA: Diagnosis not present

## 2019-06-16 DIAGNOSIS — I671 Cerebral aneurysm, nonruptured: Secondary | ICD-10-CM | POA: Diagnosis not present

## 2019-06-16 NOTE — Patient Instructions (Signed)
1. Schedule CTA head with and without contrast  2. Refer to [facility] for cognitive behavioral therapy for stress seizures  3. If dizziness continues that you are needing more meclizine, we can refer you to a physical therapist to hopefully help with exercises you can do to ease the dizziness  4. Follow-up in 6 months, call for any changes

## 2019-06-16 NOTE — Progress Notes (Signed)
NEUROLOGY CONSULTATION NOTE  [name] MRN: [id number] DOB: [birth year]  Referring provider: Dr. London Pepper Primary care provider: Dr. London Pepper  Reason for consult:  Second opinion, history of aneurysm, prior CVA, recurrent dizziness, pseudoseizures  Dear Dr Orland Mustard:  Thank you for your kind referral of [name] for consultation of the above symptoms. Although her history is well known to you, please allow me to reiterate it for the purpose of our medical record. She is alone in the office today. Records and images were personally reviewed where available.   HISTORY OF PRESENT ILLNESS: This is a pleasant 73 year old right-handed woman with a history of hypertension, sleep apnea, breast cancer, brain aneurysm, psychogenic seizures (PNES), presenting for second opinion. Records from her prior neurology visits at [facility] were reviewed, she was last seen in February 2020. She has a history of tremors and PNES. She is noted to have tremor since 2007, getting worse with stress, intermittent mild slurred speech, stuttering, feeling her whole body was tremoring lasting 5 to 10 minutes. She never had loss of consciousness.In 2017 she felt her symptoms have been healed by prayer. At one point she was on Levetiracetam and Lamotrigine, which made her feel more shaky and nervous. She takes clonazepam three times a day, which helps better with the shaking. She had an incident in February 2020 while receiving radiation treatment for breast cancer, and another in the ER. She was able to communicate during the episode. She admitted to feeling stressed. She had an MRI brain without contrast which I personally reviewed, no acute changes. There was moderate to severe chronic microvascular disease, old small basal ganglia and left cerebellar infarcts. 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 She had an MRI cervical spine which was degraded by  motion, mild suspected canal stenosis at C4-5 and C5-6. She was discharged home on Keppra but stopped it due to side effects. She states that anything can trigger the shaking. When she gets tired, she starts shaking. She was half asleep the other day and the phone rang, startling her making the shaking start. She has them when she is in a situation where she does not understand something. She also stutters, she knows what she wants to say, then it gets so bad her whole body is shaking while she is awake and alert. She recalls being monitored with EEG inpatient and the shaking was "brought on," EEG normal, she was not discharged on any seizure medication. She was prescribed clonazepam by a psychiatrist in [place], which helps with the shaking. She reports the last bad one was a couple of months ago. If she feels like she will have one, she usually sits and makes sure she takes clonazepam. She takes 1 tab qhs, 1/2 tab during the day, at most she takes 1.5 tabs total in a day. She lives alone. She has not injured herself with the episodes. She states these do not occur when driving because she is calm when driving.   She has dizziness mostly when standing and with positional changes. She states she is not sure if it is in her head or her allergies that make her stagger and feel dizzy. This is a near constant sensation. She was taking meclizine but stopped it because of elevated creatinine. She feels lightheaded and staggers but denies any falls. Sometimes when laying down the bed would spin. She has frequent tension headaches every other day in the back  of her head/neck. She has back and neck pain. She has occasional paresthesias in her hands, her left arm cramps up sometimes. She has a side to side head tremor that started in her 45s. She has occasional left>right hand tremor different from the shaking, sometimes affecting writing or using utensils. She reports a lot of stress in her life, a little less now. She  reports bowel and bladder incontinence.    PAST MEDICAL HISTORY: Past Medical History:  Diagnosis Date  . Anemia   . Anxiety   . Arthritis   . Bladder incontinence   . Bowel incontinence   . Breast cancer ([place]) 08/16/13   left, 1 o'clock  . Chronic cystitis   . Chronic diarrhea   . Complication of anesthesia    hard to wake up  . Depression   . Full dentures   . GERD (gastroesophageal reflux disease)   . High blood pressure   . History of radiation therapy 11/02/13- 12/24/13   left breast 4680 cGy in 26 sessions, left breast boost 1400 cGy in 7 sessions  . HOH (hard of hearing)   . Hx of colonic polyps 12/03/2016  . Hypothyroidism   . Personal history of radiation therapy   . Pneumonia    hx  . Pseudoseizures   . Shortness of breath    when walks  . Sleep apnea    has a cpap-5 hr/ not used in years  . Stroke ([place]) 1980   legs weak  . Tremor   . Urinary incontinence   . Vertigo     PAST SURGICAL HISTORY: Past Surgical History:  Procedure Laterality Date  . ABDOMINAL HYSTERECTOMY    . BACK SURGERY  2005   lumbar fusion  . BREAST LUMPECTOMY Left 2015  . BREAST LUMPECTOMY Right 07/02/2018   BREAST LUMPECTOMY WITH RADIOACTIVE SEED WITH RIGHT SEED TARGETED LYMPH NODE BIOPSY AND SENTINEL LYMPH NODE BIOPSY (Right Breast)  . BREAST LUMPECTOMY WITH NEEDLE LOCALIZATION AND AXILLARY SENTINEL LYMPH NODE BX Left 09/29/2013   Procedure: BREAST LUMPECTOMY WITH NEEDLE LOCALIZATION AND AXILLARY SENTINEL LYMPH NODE BX;  Surgeon: [name], MD;  Location: [facility];  Service: General;  Laterality: Left;  clean wound class  . BREAST LUMPECTOMY WITH RADIOACTIVE SEED AND AXILLARY LYMPH NODE DISSECTION Right 07/02/2018   Procedure: BREAST LUMPECTOMY WITH RADIOACTIVE SEED WITH RIGHT SEED TARGETED LYMPH NODE BIOPSY AND SENTINEL LYMPH NODE BIOPSY;  Surgeon: [name], MD;  Location: [facility];  Service: General;  Laterality: Right;  . COLONOSCOPY    . MULTIPLE TOOTH  EXTRACTIONS    . ORIF WRIST FRACTURE Left 07/08/2015   Procedure: OPEN REDUCTION INTERNAL FIXATION (ORIF) LEFT WRIST FRACTURE AND REPAIR AS INDICATED;  Surgeon: Iran Planas, MD;  Location: [facility];  Service: Orthopedics;  Laterality: Left;  . ovarian cysts    . TOTAL HIP ARTHROPLASTY  2005   rt total hip    MEDICATIONS: Current Outpatient Medications on File Prior to Visit  Medication Sig Dispense Refill  . acetaminophen (TYLENOL) 325 MG tablet Take 2 tablets (650 mg total) by mouth every 6 (six) hours as needed for mild pain, fever or headache (or Fever >/= 101). 50 tablet 0  . albuterol (PROVENTIL HFA;VENTOLIN HFA) 108 (90 Base) MCG/ACT inhaler Inhale 2 puffs into the lungs every 6 (six) hours as needed for wheezing or shortness of breath. 1 Inhaler 2  . amLODipine (NORVASC) 10 MG tablet Take 1 tablet (10 mg total) by mouth daily. 30 tablet 0  .  atenolol (TENORMIN) 25 MG tablet Take 1 tablet (25 mg total) by mouth at bedtime. 30 tablet 0  . Cholecalciferol (VITAMIN D3 PO) Take by mouth.    . clonazePAM (KLONOPIN) 1 MG tablet Take 1 tablet (1 mg total) by mouth 3 (three) times daily. (Patient taking differently: Take 0.5-1 mg by mouth 3 (three) times daily. ) 21 tablet 0  . escitalopram (LEXAPRO) 20 MG tablet Take 1 tablet (20 mg total) by mouth at bedtime. 30 tablet 0  . fluticasone (FLONASE) 50 MCG/ACT nasal spray Place 1 spray into both nostrils daily as needed for allergies. 1 g 0  . guaiFENesin-dextromethorphan (ROBITUSSIN DM) 100-10 MG/5ML syrup Take 5 mLs by mouth every 4 (four) hours as needed for cough. 118 mL 0  . lamoTRIgine (LAMICTAL) 100 MG tablet Take 1 tablet (100 mg total) by mouth 2 (two) times daily. (patient not taking) 60 tablet 5  . letrozole (FEMARA) 2.5 MG tablet Take 1 tablet (2.5 mg total) by mouth daily. 90 tablet 3  . levothyroxine (SYNTHROID, LEVOTHROID) 50 MCG tablet Take 1 tablet (50 mcg total) by mouth daily before breakfast. 30 tablet 0  . Loperamide HCl  (IMODIUM PO) Take 2 mg by mouth daily as needed (diarrhea).     . meclizine (ANTIVERT) 12.5 MG tablet Take 12.5 mg by mouth at bedtime as needed for dizziness.    . montelukast (SINGULAIR) 10 MG tablet Take 1 tablet (10 mg total) by mouth at bedtime. 30 tablet 0  . naproxen sodium (ANAPROX) 220 MG tablet Take 440 mg by mouth 2 (two) times daily as needed (pain).     . Thiamine HCl (VITAMIN B-1 PO) Take by mouth.     No current facility-administered medications on file prior to visit.     ALLERGIES: Allergies  Allergen Reactions  . Codeine Nausea And Vomiting    FAMILY HISTORY: Family History  Problem Relation Age of Onset  . Lung cancer Brother   . Diabetes Brother   . Stroke Mother   . Ataxia Neg Hx   . Chorea Neg Hx   . Dementia Neg Hx   . Mental retardation Neg Hx   . Migraines Neg Hx   . Multiple sclerosis Neg Hx   . Neurofibromatosis Neg Hx   . Neuropathy Neg Hx   . Parkinsonism Neg Hx   . Seizures Neg Hx     SOCIAL HISTORY: Social History   Socioeconomic History  . Marital status: Widowed    Spouse name: Not on file  . Number of children: 2  . Years of education: 10th grade  . Highest education level: Not on file  Occupational History  . Occupation: Retired  Scientific laboratory technician  . Financial resource strain: Not on file  . Food insecurity    Worry: Not on file    Inability: Not on file  . Transportation needs    Medical: No    Non-medical: No  Tobacco Use  . Smoking status: Never Smoker  . Smokeless tobacco: Never Used  Substance and Sexual Activity  . Alcohol use: No  . Drug use: No  . Sexual activity: Not Currently    Comment: menarche age 18, first live birth age 78, P85,  contraception x 40 years, menopause 54  Lifestyle  . Physical activity    Days per week: Not on file    Minutes per session: Not on file  . Stress: Not on file  Relationships  . Social Herbalist on phone:  Not on file    Gets together: Not on file    Attends religious  service: Not on file    Active member of club or organization: Not on file    Attends meetings of clubs or organizations: Not on file    Relationship status: Not on file  . Intimate partner violence    Fear of current or ex partner: Not on file    Emotionally abused: Not on file    Physically abused: Not on file    Forced sexual activity: Not on file  Other Topics Concern  . Not on file  Social History Narrative      Lives in assisted living   Right-handed.   1 cup caffeine per day.    REVIEW OF SYSTEMS: Constitutional: No fevers, chills, or sweats, no generalized fatigue, change in appetite Eyes: No visual changes, double vision, eye pain Ear, nose and throat: No hearing loss, ear pain, nasal congestion, sore throat Cardiovascular: No chest pain, palpitations Respiratory:  No shortness of breath at rest or with exertion, wheezes GastrointestinaI: No nausea, vomiting, diarrhea, abdominal pain, fecal incontinence Genitourinary:  No dysuria, urinary retention or frequency Musculoskeletal:  + neck pain, back pain Integumentary: No rash, pruritus, skin lesions Neurological: as above Psychiatric: + depression, anxiety Endocrine: No palpitations, fatigue, diaphoresis, mood swings, change in appetite, change in weight, increased thirst Hematologic/Lymphatic:  No anemia, purpura, petechiae. Allergic/Immunologic: no itchy/runny eyes, nasal congestion, recent allergic reactions, rashes  PHYSICAL EXAM: Vitals:   06/16/19 1257  BP: 136/78  Pulse: 68  SpO2: 95%   General: No acute distress Head:  Normocephalic/atraumatic Skin/Extremities: No rash, no edema Neurological Exam: Mental status: alert and oriented to person, place, and time, no dysarthria or aphasia, Fund of knowledge is appropriate.  Recent and remote memory are intact.  Attention and concentration are normal.    Able to name objects and repeat phrases. Cranial nerves: CN I: not tested CN II: pupils equal, round and  reactive to light, visual fields intact CN III, IV, VI:  full range of motion, no nystagmus, no ptosis CN V: facial sensation intact CN VII: upper and lower face symmetric CN VIII: hearing intact to conversation CN IX, X: gag intact, uvula midline CN XI: sternocleidomastoid and trapezius muscles intact CN XII: tongue midline Bulk & Tone: normal, no fasciculations, no cogwheeling Motor: 5/5 throughout with no pronator drift. Sensation: intact to light touch, cold, pin, vibration and joint position sense.  No extinction to double simultaneous stimulation.  Romberg test negative Deep Tendon Reflexes: +1 throughout, no ankle clonus Plantar responses: downgoing bilaterally Cerebellar: no incoordination on finger to nose testing Gait: slow and cautious, no ataxia Tremor: she has a side to side head tremor. No tremor noted in extremities, no postural/action tremor.   IMPRESSION: This is a pleasant 73 year old right-handed woman with a history of hypertension, sleep apnea, breast cancer, brain aneurysm, psychogenic seizures (PNES), presenting for second opinion. She has had inpatient EEG monitoring 10 years ago and was diagnosed with psychogenic non-epileptic shaking episodes, she has an understanding of this diagnosis, we discussed that there is no indication for anti-epileptic medication for PNES, which she agrees with. She has been taking clonazepam for anxiety and for the spells, recommended cognitive behavioral therapy for PNES. We discussed MRI brain findings from 09/2018, including possible 74mm aneurysm at the right MCA bifurcation, CTA head with and without contrast will be ordered to further assess. We discussed the dizziness that appears positional, she would like  to try prn meclizine first, and if no change, will call our office for referral to vestibular therapy.  driving laws were discussed with the patient, and she knows to stop driving after an episode of loss of awareness, until 6 months  event-free. Follow-up in 6 months, she knows to call for any changes.   Thank you for allowing me to participate in the care of this patient. Please do not hesitate to call for any questions or concerns.   Ellouise Newer, M.D.  CC: Dr. Orland Mustard

## 2019-07-03 ENCOUNTER — Ambulatory Visit (HOSPITAL_COMMUNITY): Payer: Self-pay | Admitting: Psychiatry

## 2019-07-10 ENCOUNTER — Ambulatory Visit (HOSPITAL_COMMUNITY): Payer: Medicaid Other | Admitting: Psychiatry

## 2019-07-10 ENCOUNTER — Other Ambulatory Visit: Payer: Self-pay

## 2019-07-10 ENCOUNTER — Ambulatory Visit (HOSPITAL_COMMUNITY): Payer: Self-pay | Admitting: Psychiatry

## 2019-07-21 ENCOUNTER — Ambulatory Visit
Admission: RE | Admit: 2019-07-21 | Discharge: 2019-07-21 | Disposition: A | Discharge status: Home / Self Care | Payer: Medicare Other | Source: Ambulatory Visit | Attending: Hematology and Oncology | Admitting: Hematology and Oncology

## 2019-07-21 ENCOUNTER — Other Ambulatory Visit: Payer: Self-pay

## 2019-07-21 ENCOUNTER — Other Ambulatory Visit: Payer: Self-pay | Admitting: Hematology and Oncology

## 2019-07-21 DIAGNOSIS — R921 Mammographic calcification found on diagnostic imaging of breast: Secondary | ICD-10-CM

## 2019-07-21 DIAGNOSIS — C50411 Malignant neoplasm of upper-outer quadrant of right female breast: Secondary | ICD-10-CM

## 2019-07-21 DIAGNOSIS — Z170 Estrogen receptor positive status [ER+]: Secondary | ICD-10-CM

## 2019-07-21 HISTORY — DX: Personal history of antineoplastic chemotherapy: Z92.21

## 2019-08-27 DEATH — deceased

## 2019-10-28 ENCOUNTER — Ambulatory Visit: Payer: Medicare Other | Admitting: Hematology and Oncology

## 2019-11-29 ENCOUNTER — Ambulatory Visit: Payer: Medicare Other | Admitting: Hematology and Oncology

## 2019-12-30 IMAGING — NM NM BONE WHOLE BODY
2 series · 2 of 2 positions shown · non-contrast
Comparison: None

Radiographic correlation: CT abdomen and pelvis 05/11/2018

CLINICAL DATA: Breast cancer, back pain, lymph node positive
disease question osseous metastasis

EXAM:
NUCLEAR MEDICINE WHOLE BODY BONE SCAN
TECHNIQUE: Whole body anterior and posterior images were obtained approximately
3 hours after intravenous injection of radiopharmaceutical.
RADIOPHARMACEUTICALS:  19 mCi Dechnetium-QQm MDP IV which may

[Series 1: [email or web address] whole body · 2.66mm/px · 1 of 1 slices shown (1 of 2)]
[im 1/1]
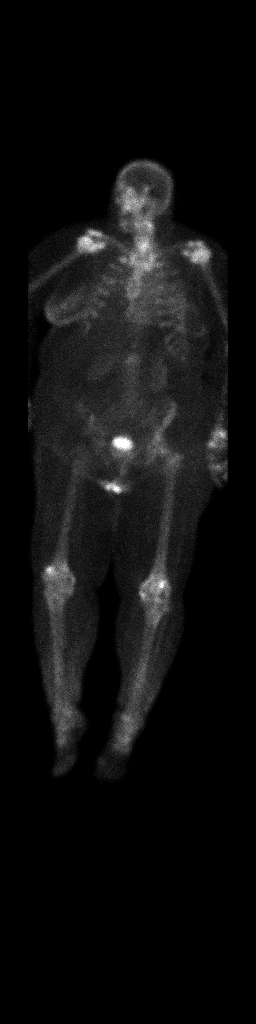

[Series 1: [email or web address] whole body · 2.66mm/px · 1 of 1 slices shown (2 of 2)]
[im 1/1]
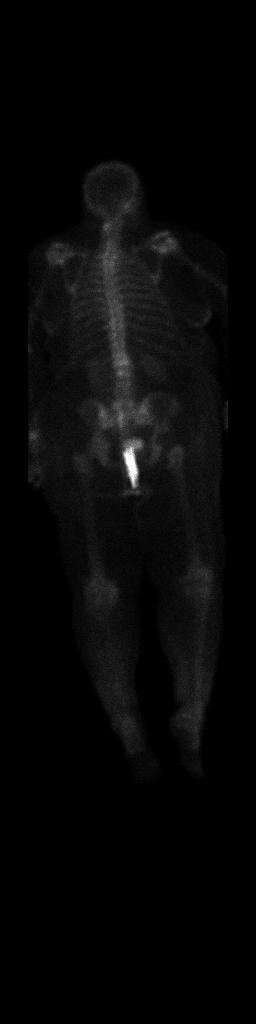

[2 of 2 positions shown; findings below may reference images not displayed]

FINDINGS: Uptake at the shoulders, sternoclavicular joints, LEFT wrist, and
both knees, typically degenerative.

Uptake at the LEFT lateral aspect of the cervical spine typically
degenerative.

Uptake at approximately L1 and L2 vertebral bodies; patient has
degenerative disc disease changes at both L1-L2 and L2-L3 yeah as
well as prior lower lumbar fusion L3-L5 on recent CT.

No additional sites of worrisome osseous tracer accumulation are
identified.

Expected urinary tract and soft tissue distribution of tracer.
IMPRESSION: Multiple scattered sites of likely degenerative type uptake with
additional tracer localization at the L1 and L2 vertebral bodies,
corresponding to levels of degenerative disc disease changes on most
recent CT.

No other definite scintigraphic abnormalities identified.

## 2020-01-03 IMAGING — MG MM BREAST LOCALIZATION CLIP
4 series · 4 of 4 positions shown · non-contrast
Comparison: Previous exam(s).

CLINICAL DATA: Post ultrasound-guided radioactive seed placement
within the right breast 9:30 o'clock biopsy-proven malignancy, and
biopsy-proven malignant right axillary lymph node.

EXAM:
DIAGNOSTIC RIGHT MAMMOGRAM POST ULTRASOUND BIOPSY

[R MLO (1 of 3)]
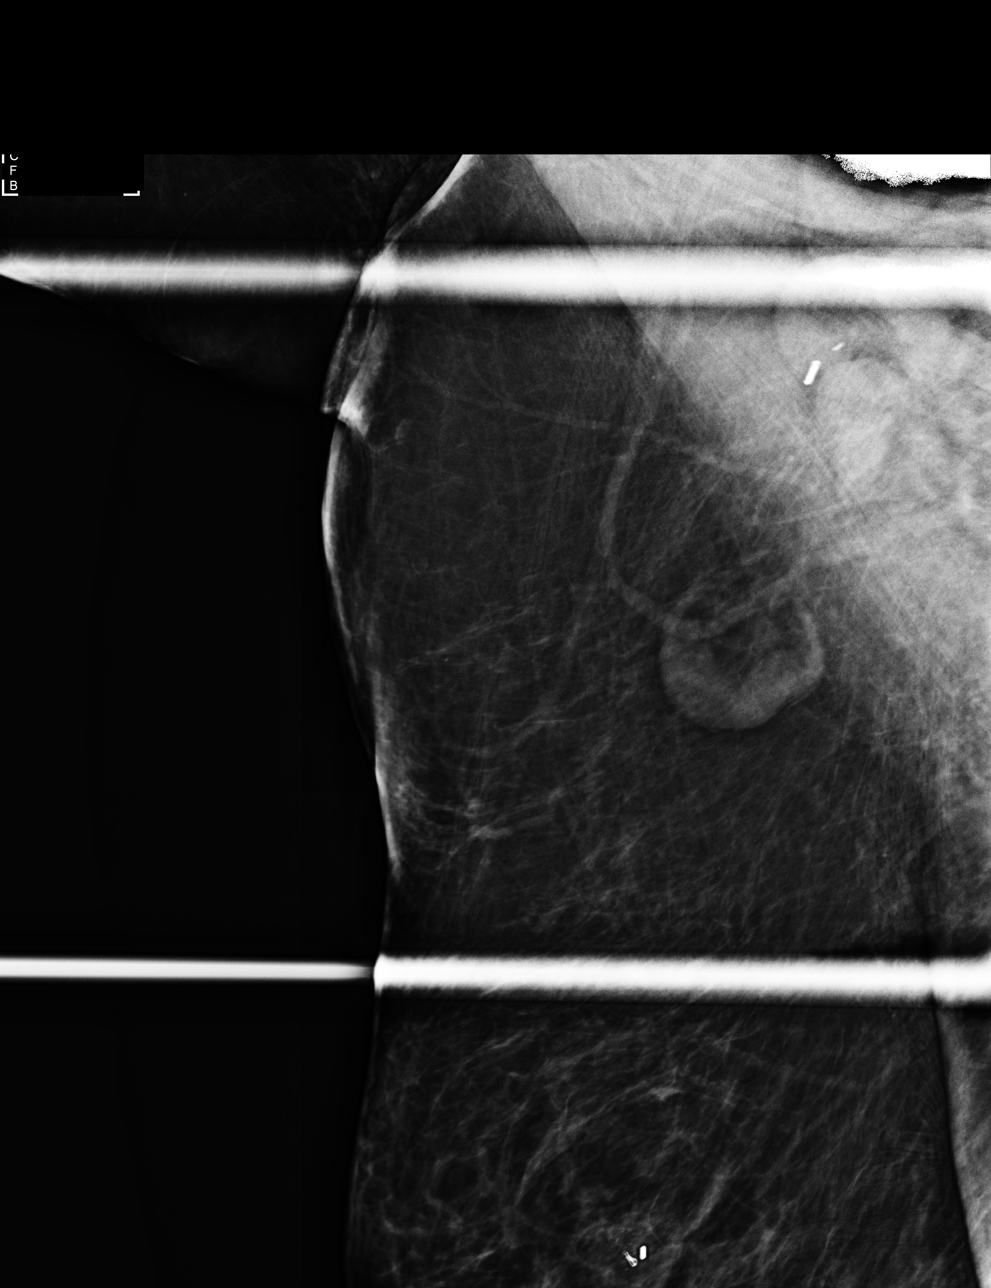

[R CC]
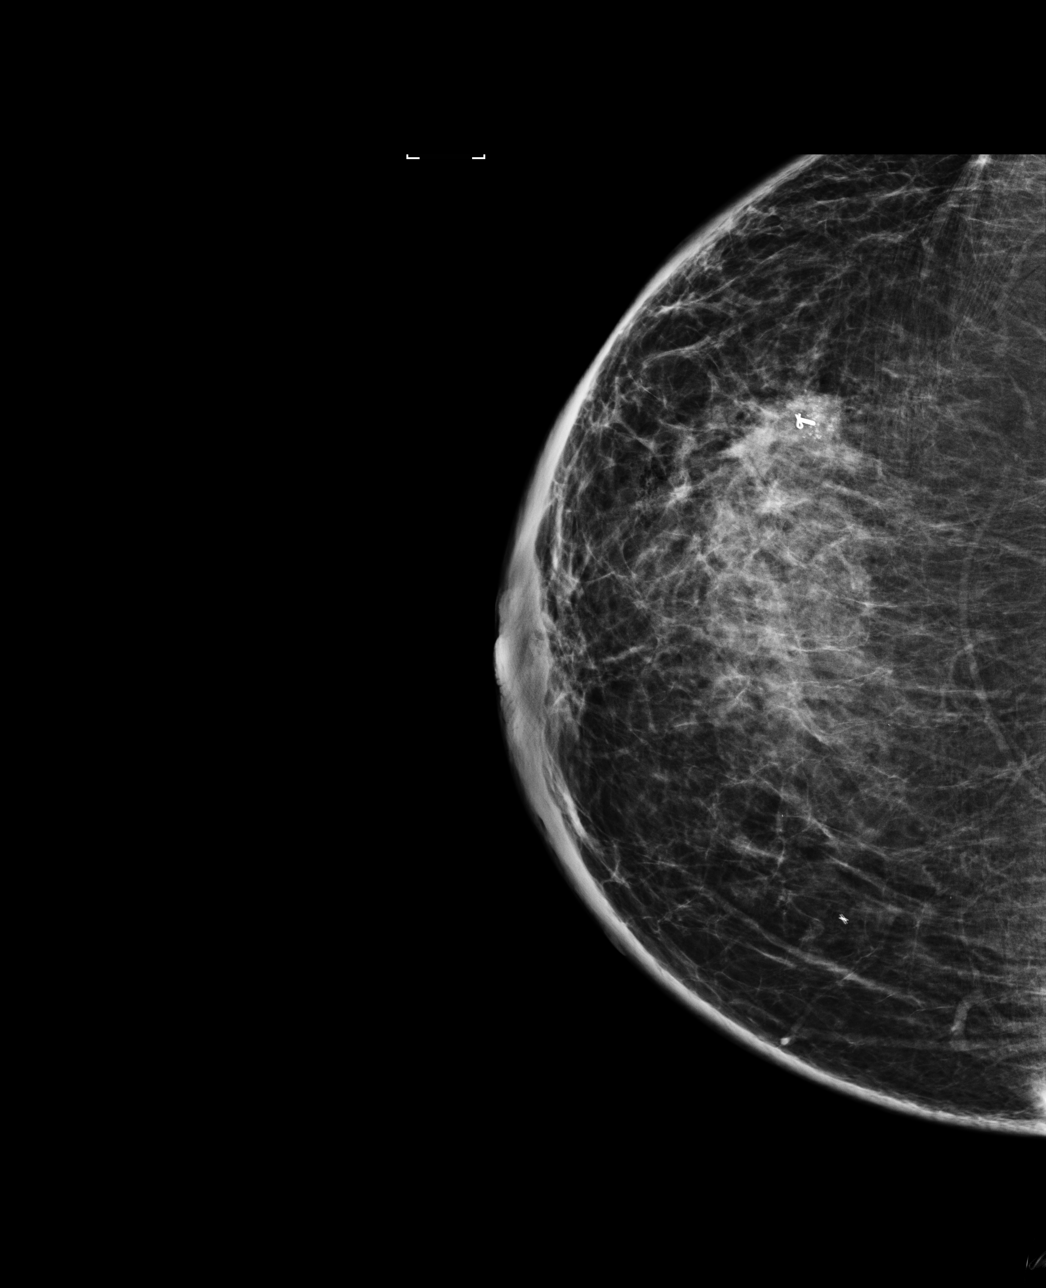

[R MLO (2 of 3)]
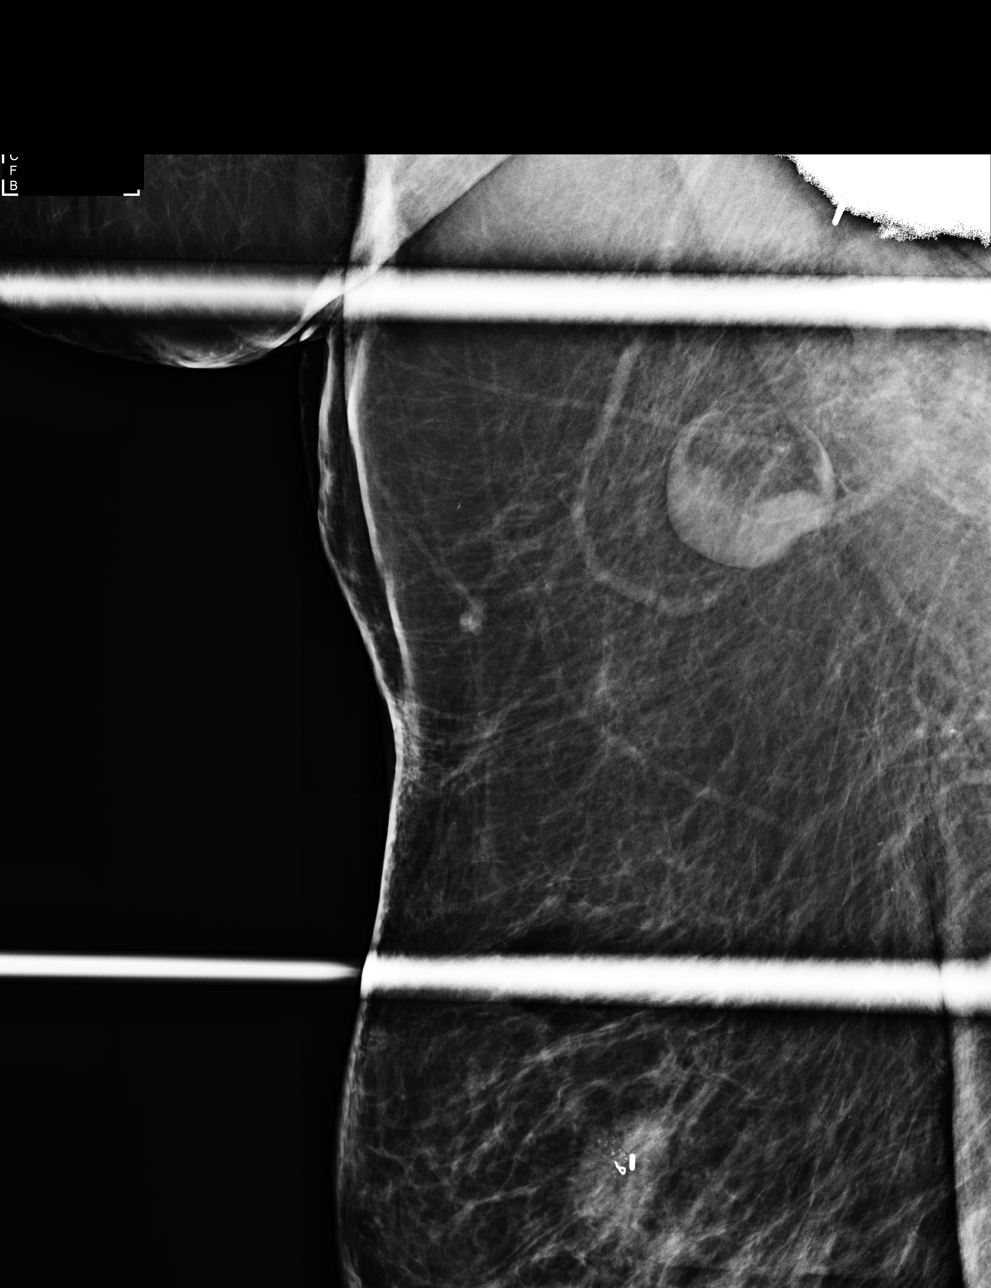

[R MLO (3 of 3)]
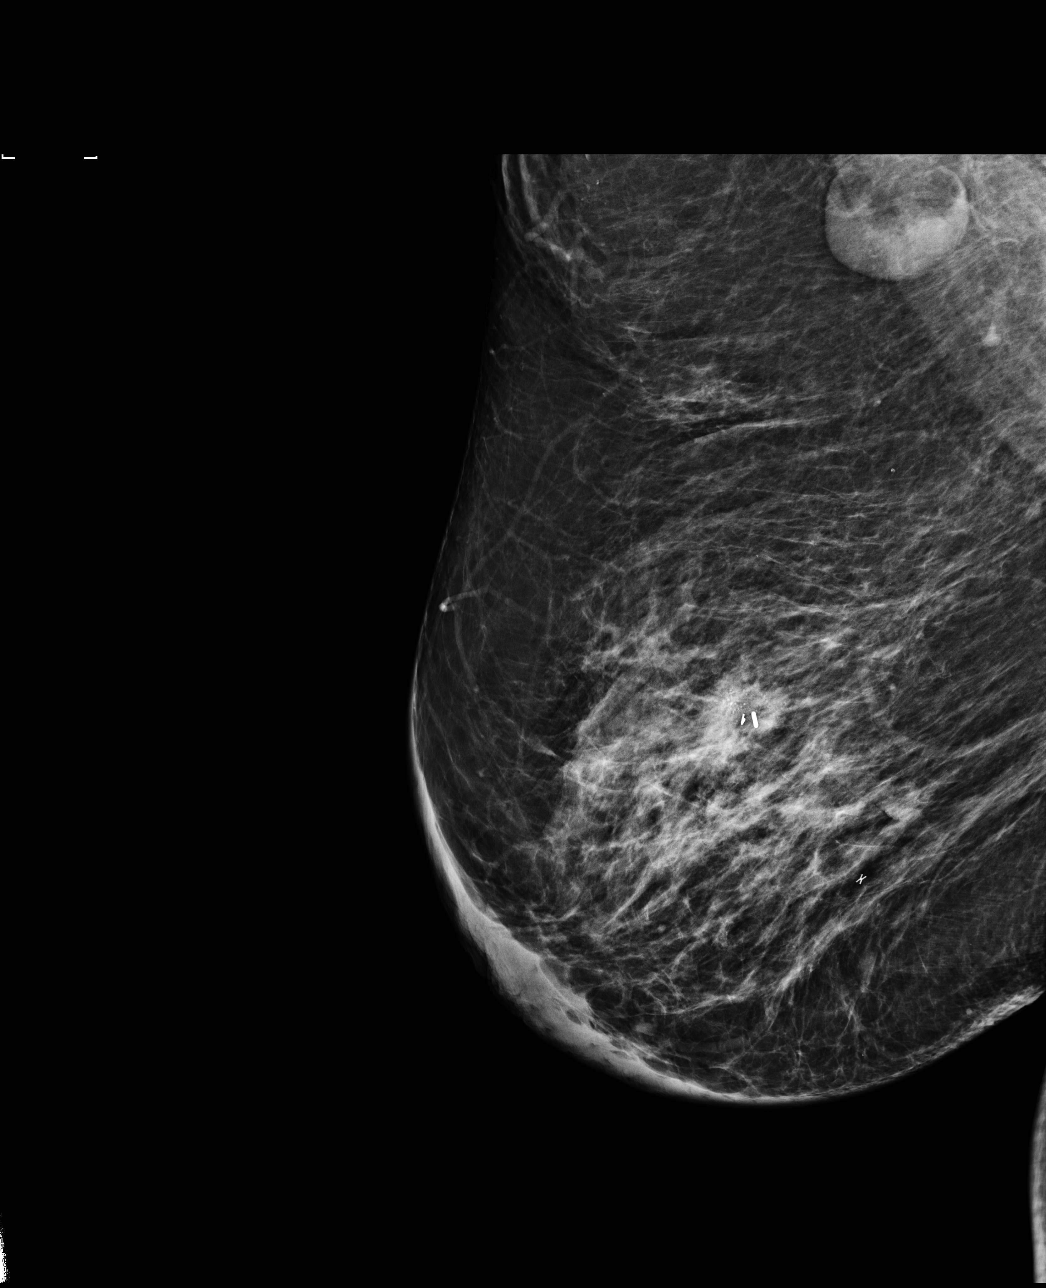

[4 of 4 positions shown; findings below may reference images not displayed]

FINDINGS: Mammographic images were obtained following ultrasound guided
placement of radioactive seeds within the right breast 9:30 o'clock
biopsy-proven malignancy, and biopsy-proven malignant right axillary
lymph node. Two-view mammography demonstrates presence of a
radioactive seed within the right breast 9:30 o'clock mass,
immediately adjacent to the ribbon shaped post biopsy marker, and a
radioactive seed within a right axillary lymph node, 2-3 mm from the
spiral shaped post biopsy marker. At least 2 other abnormal lymph
nodes are located in immediate proximity to the biopsy-proven
malignant right axillary lymph node.
IMPRESSION: Successful placement of radioactive seed within right breast 9:30
o'clock mass and a malignant right axillary lymph node.

Final Assessment: Post Procedure Mammograms for Marker Placement

## 2020-01-17 ENCOUNTER — Ambulatory Visit: Payer: Medicare Other | Admitting: Neurology
